# Patient Record
Sex: Male | Born: 1945 | Race: White | Hispanic: No | Marital: Single | Smoking: Current every day smoker
Health system: Southern US, Community
[De-identification: ages and names within clinical notes are randomized; demographics above are authoritative.]

## PROBLEM LIST (undated history)

## (undated) DIAGNOSIS — C61 Malignant neoplasm of prostate: Secondary | ICD-10-CM

## (undated) DIAGNOSIS — E119 Type 2 diabetes mellitus without complications: Secondary | ICD-10-CM

## (undated) DIAGNOSIS — IMO0001 Reserved for inherently not codable concepts without codable children: Secondary | ICD-10-CM

## (undated) DIAGNOSIS — R05 Cough: Secondary | ICD-10-CM

## (undated) DIAGNOSIS — R519 Headache, unspecified: Secondary | ICD-10-CM

## (undated) DIAGNOSIS — G4730 Sleep apnea, unspecified: Secondary | ICD-10-CM

## (undated) DIAGNOSIS — I2510 Atherosclerotic heart disease of native coronary artery without angina pectoris: Secondary | ICD-10-CM

## (undated) DIAGNOSIS — R972 Elevated prostate specific antigen [PSA]: Secondary | ICD-10-CM

## (undated) DIAGNOSIS — R609 Edema, unspecified: Secondary | ICD-10-CM

## (undated) DIAGNOSIS — R51 Headache: Secondary | ICD-10-CM

## (undated) DIAGNOSIS — I10 Essential (primary) hypertension: Secondary | ICD-10-CM

## (undated) DIAGNOSIS — D649 Anemia, unspecified: Secondary | ICD-10-CM

## (undated) DIAGNOSIS — J449 Chronic obstructive pulmonary disease, unspecified: Secondary | ICD-10-CM

## (undated) DIAGNOSIS — R0601 Orthopnea: Secondary | ICD-10-CM

## (undated) DIAGNOSIS — R059 Cough, unspecified: Secondary | ICD-10-CM

## (undated) DIAGNOSIS — M1990 Unspecified osteoarthritis, unspecified site: Secondary | ICD-10-CM

## (undated) DIAGNOSIS — R0902 Hypoxemia: Secondary | ICD-10-CM

## (undated) HISTORY — PX: KNEE ARTHROSCOPY: SHX127

## (undated) HISTORY — DX: Malignant neoplasm of prostate: C61

## (undated) HISTORY — DX: Anemia, unspecified: D64.9

## (undated) HISTORY — PX: COLONOSCOPY: SHX174

## (undated) HISTORY — PX: EYE SURGERY: SHX253

---

## 2003-07-15 ENCOUNTER — Other Ambulatory Visit: Payer: Self-pay

## 2004-09-06 ENCOUNTER — Ambulatory Visit: Payer: Self-pay | Admitting: Family Medicine

## 2005-06-11 ENCOUNTER — Emergency Department: Payer: Self-pay | Admitting: Emergency Medicine

## 2005-06-11 ENCOUNTER — Other Ambulatory Visit: Payer: Self-pay

## 2005-06-11 ENCOUNTER — Observation Stay: Payer: Self-pay | Admitting: General Surgery

## 2007-08-15 ENCOUNTER — Ambulatory Visit: Payer: Self-pay | Admitting: Internal Medicine

## 2008-08-16 ENCOUNTER — Inpatient Hospital Stay: Payer: Self-pay | Admitting: Internal Medicine

## 2008-12-02 DIAGNOSIS — J449 Chronic obstructive pulmonary disease, unspecified: Secondary | ICD-10-CM | POA: Insufficient documentation

## 2009-08-02 ENCOUNTER — Emergency Department: Payer: Self-pay | Admitting: Emergency Medicine

## 2009-08-03 ENCOUNTER — Emergency Department: Payer: Self-pay | Admitting: Emergency Medicine

## 2010-01-03 ENCOUNTER — Emergency Department: Payer: Self-pay | Admitting: Emergency Medicine

## 2010-06-28 DIAGNOSIS — M5430 Sciatica, unspecified side: Secondary | ICD-10-CM | POA: Insufficient documentation

## 2010-10-29 ENCOUNTER — Ambulatory Visit: Payer: Self-pay | Admitting: Family Medicine

## 2010-11-16 ENCOUNTER — Ambulatory Visit: Payer: Self-pay | Admitting: Family Medicine

## 2011-02-01 ENCOUNTER — Ambulatory Visit: Payer: Self-pay | Admitting: Family Medicine

## 2011-04-14 DIAGNOSIS — E119 Type 2 diabetes mellitus without complications: Secondary | ICD-10-CM | POA: Insufficient documentation

## 2011-07-21 DIAGNOSIS — R7402 Elevation of levels of lactic acid dehydrogenase (LDH): Secondary | ICD-10-CM | POA: Insufficient documentation

## 2012-02-13 ENCOUNTER — Ambulatory Visit: Payer: Self-pay | Admitting: Vascular Surgery

## 2012-02-13 LAB — BUN: BUN: 20 mg/dL — ABNORMAL HIGH (ref 7–18)

## 2012-02-13 LAB — CREATININE, SERUM
Creatinine: 1.53 mg/dL — ABNORMAL HIGH (ref 0.60–1.30)
EGFR (African American): 54 — ABNORMAL LOW

## 2012-06-12 HISTORY — PX: CARDIAC CATHETERIZATION: SHX172

## 2013-01-23 DIAGNOSIS — E785 Hyperlipidemia, unspecified: Secondary | ICD-10-CM | POA: Insufficient documentation

## 2013-01-23 DIAGNOSIS — I493 Ventricular premature depolarization: Secondary | ICD-10-CM | POA: Insufficient documentation

## 2013-01-23 DIAGNOSIS — I429 Cardiomyopathy, unspecified: Secondary | ICD-10-CM | POA: Insufficient documentation

## 2013-10-30 ENCOUNTER — Ambulatory Visit: Payer: Self-pay | Admitting: Ophthalmology

## 2013-10-30 DIAGNOSIS — I10 Essential (primary) hypertension: Secondary | ICD-10-CM

## 2013-10-30 LAB — POTASSIUM: Potassium: 4.4 mmol/L (ref 3.5–5.1)

## 2013-11-11 ENCOUNTER — Ambulatory Visit: Payer: Self-pay | Admitting: Ophthalmology

## 2013-11-28 ENCOUNTER — Inpatient Hospital Stay: Payer: Self-pay | Admitting: Internal Medicine

## 2013-11-28 LAB — BASIC METABOLIC PANEL
ANION GAP: 6 — AB (ref 7–16)
BUN: 16 mg/dL (ref 7–18)
CALCIUM: 8.6 mg/dL (ref 8.5–10.1)
CHLORIDE: 107 mmol/L (ref 98–107)
CREATININE: 1.34 mg/dL — AB (ref 0.60–1.30)
Co2: 27 mmol/L (ref 21–32)
EGFR (African American): >60
GFR CALC NON AF AMER: 54 — AB
GLUCOSE: 94 mg/dL (ref 65–99)
OSMOLALITY: 280 (ref 275–301)
POTASSIUM: 3.7 mmol/L (ref 3.5–5.1)
Sodium: 140 mmol/L (ref 136–145)

## 2014-08-13 DIAGNOSIS — M48062 Spinal stenosis, lumbar region with neurogenic claudication: Secondary | ICD-10-CM | POA: Insufficient documentation

## 2014-08-13 DIAGNOSIS — M5416 Radiculopathy, lumbar region: Secondary | ICD-10-CM | POA: Insufficient documentation

## 2014-09-29 NOTE — Op Note (Signed)
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   PROCEDURES PERFORMED:  1. Abdominal aortogram.  2. Left lower extremity distal runoff.   SURGEON: Hortencia Pilar, MD  SEDATION: Versed 4 mg IV, continuous ECG, pulse oximetry and cardiopulmonary monitoring was performed throughout the entire procedure by the interventional radiology nurse. Total sedation time was 45 minutes.   ACCESS: 5 French sheath, left common femoral artery.   CONTRAST USED: Isovue 95 mL.   FLUORO TIME: 1.4 minutes.   INDICATIONS: Michael Mathews is a 69 year old gentleman with increasing difficulty with ambulation. Physical exam as well as noninvasive studies suggested common iliac artery stenosis as well as distal disease. He is therefore undergoing angiography with the hope for intervention. The risks and benefits were reviewed, all questions answered, and the patient agrees to proceed.   DESCRIPTION OF PROCEDURE: The patient is taken to special procedures and placed in the supine position. After adequate sedation is achieved, ultrasound is placed in a sterile sleeve. Ultrasound is utilized secondary to lack of appropriate landmarks and to avoid vascular injury. Under direct ultrasound visualization, the common femoral artery is identified, it is pulsatile and echolucent indicating patency, and image is recorded for the permanent record. Under real-time visualization, micropuncture needle is inserted into the anterior wall, microwire followed by micro sheath, J-wire followed by 5 French sheath, and 5 French pigtail catheter. The pigtail catheter is positioned at the level of T12 and AP projection of the  aorta is obtained with a bolus injection of contrast. Pigtail catheter is then repositioned to above the bifurcation and bilateral oblique views are obtained. Magnified views of the left side are also obtained with the pigtail catheter slightly repositioned into the left lower extremity. Hand injection through the sheath is then used to perform distal runoff. Images from just below the trifurcation down are inadequate. This is secondary to multilevel stenoses within the arterial system. After review of the images, the patient had not been consented for left lower extremity interventions and therefore no further treatments at this time, oblique view of the groin is reviewed and a 5 French Mynx device deployed with excellent result. There were no immediate complications.   INTERPRETATION: The abdominal aorta is opacified with bolus injection of contrast. There are several infrarenal areas of plaque formation with mild to moderate narrowing, however, they do not achieve hemodynamic significance. The aortic bifurcation itself is widely patent and both left and right common and external iliac arteries are widely patent. In multiple views, also in magnified images, I do not identify the area which was noted on the ultrasound.   The left common femoral is widely patent and profunda femoris is patent, although it has diffuse disease. SFA demonstrates diffuse disease throughout its course with a subtotal occlusion at Hunter's canal. The mid popliteal demonstrates a short segment string sign and there also appears to be a string sign associated with the tibioperoneal trunk. Below this level, given the multilevel disease, images are inadequate.   SUMMARY: Multilevel disease within the left lower extremity beginning at [facility] canal and extending into the tibial vessels, as described above.  ____________________________ Katha Cabal, MD ggs:slb D: 02/13/2012 16:40:07 ET T: 02/13/2012 17:06:38  ET JOB#: [id number]  cc: Katha Cabal, MD, <Dictator> Iona Beard  Joaquim Lai, MD Katha Cabal MD ELECTRONICALLY SIGNED 02/16/2012 16:24

## 2014-10-03 NOTE — Consult Note (Signed)
Ace Inhibitors: Other   Impression 1 HTN 2. tobacco dependence 3. COPD not compliant with O2 at home 4 PAD POD #0 Abdominal Aortogram w/Run-Off; Right Lower Extremity; PTA right SFA and POP; PTA right AT; suction thrombectomy right distal popliteal Thrombus formation in distal popliteal 5. HLD 6. morbid obesity 7. DM   Plan 1. restart home meds try to wean off nifedepine gtt 2. cont SSI/ADA diet glipizide 3. nicotine patch counselled 3 minutes not going to quit 4. obesity weight loss as tolerated  thank you will follow  [phone]   Electronic Signatures: Bettey Costa (MD)  (Signed [phone] 18:31)  Authored: Allergies, Impression/Plan   Last Updated: 19-Jun-15 18:31 by Bettey Costa (MD)

## 2014-10-03 NOTE — Op Note (Signed)
PATIENT NAME:  Michael Mathews, Michael Mathews MR#:  [id number] DATE OF BIRTH:  02/12/1946  DATE OF PROCEDURE:  11/28/2013  PREOPERATIVE DIAGNOSES:  1.  Atherosclerotic occlusive disease, bilateral lower extremities, with rest pain of the right lower extremity.  2.  Morbid obesity.  3.  Chronic obstructive pulmonary disease.  4.  Sleep apnea.   POSTOPERATIVE DIAGNOSES:  1.  Atherosclerotic occlusive disease, bilateral lower extremities, with rest pain of the right lower extremity.  2.  Morbid obesity.  3.  Chronic obstructive pulmonary disease.  4.  Sleep apnea.  PROCEDURES PERFORMED: 1.  Abdominal aortogram.  2.  Right lower extremity distal runoff, third order catheter placement.  3.  Percutaneous transluminal angioplasty to a maximum diameter of 5 mm, right popliteal and SFA.  4.  Percutaneous transluminal angioplasty of the right anterior tibial to 2 mm.   SURGEON:  Katha Cabal, M.D.   SEDATION:  Versed 5 mg plus fentanyl 200 mcg administered IV.  Continuous ECG, pulse oximetry and cardiopulmonary monitoring was performed throughout the entire procedure by the interventional radiology nurse.  Total sedation time was 2 hours, 20 minutes.   ACCESS:  A 6 French sheath, left superficial femoral artery.   FLUOROSCOPY TIME:  31.7 minutes.   CONTRAST USED:  Isovue 125 mL.   INDICATIONS:  Michael Mathews is a 69 year old gentleman with multiple medical problems who presented with increasing pain in his right lower extremity and inability to walk.  Noninvasive studies as well as physical examination demonstrated profound atherosclerotic occlusive disease.  His ABI on the right measures 0.26.  Intervention was discussed.  Risks and benefits were reviewed.  The patient has elected to proceed.   DESCRIPTION OF PROCEDURE:  The patient is taken to special procedures and placed in the supine position.  After adequate sedation is achieved, both groins are prepped and draped in a sterile fashion.  Ultrasound is  placed in a sterile sleeve and the common femoral artery is identified at least at that point the femoral bifurcation was noted.  This appears on the final films prior to closure to have been a large branch emanating from the superficial femoral, but access is then obtained under direct visualization.  Artery is echolucent and pulsatile indicating patency.   Microwire followed by micro sheath, J-wire followed by a 5 French sheath and 5 French pigtail catheter and pigtail catheter is positioned at T12.  AP projection of the aorta is obtained.  Pigtail catheter is repositioned to above the bifurcation and LAO projection of the aorta is obtained.  Rim catheter and Glidewire crossed and the RAO projection of the groin is obtained.  The catheter is then negotiated into the SFA and distal runoff is obtained.  5000 units of heparin is given.  Stiff angled Glidewire is reintroduced and a 6 [place] Ansell sheath is advanced up and over the bifurcation.  Distal runoff is then completed demonstrating diffuse multilevel disease with several occluded segments due to eccentric calcific plaque.  Distally in the terminus of the popliteal at the level of the takeoff of the anterior tibial there is a focal occlusion.  There is occlusion of the anterior tibial, peroneal and posterior tibial throughout the proximal two-thirds.  Distally, the distal one-third of the posterior tibial reconstitutes, peroneal is poorly visualized.  Anterior tibial is nonvisualized throughout its course.   Using a combination of the crosser atherectomy catheter, several occlusions within the SFA are negotiated.  Catheter and glide wires are then negotiated and ultimately the lesion  in the distal popliteal is crossed into the anterior tibial, peroneal which is the dominant runoff to the foot could not be engaged.  In an attempt to get improved tibial flow, a 2 x 4 balloon is inflated in the anterior tibial at its origin.  Following this inflation, there  is now thrombus noted and a Fetch catheter is used to aspirate some thrombus.  The patient is started on Aggrastat and an additional 2000 units of heparin is given.  Then beginning in the mid popliteal a 4 x 100 Lutonix balloons are used to angioplasty the popliteal and SFA focal lesion at Hunter's canal which remained greater than 50% stenotic is angioplastied a second time with a 5 x 2 balloon.  Follow-up imaging demonstrates there remains diffuse disease throughout the entire common femoral as well as the SFA, but the SFA is now patent and there is in-line flow down to the popliteal.  Tibial vessel anatomy remains relatively unchanged.  The sheath is then pulled back into the left side.  An 11 cm 6 French sheath is exchanged.  Angiography and an oblique projection is performed and subsequently a Mynx device is deployed without difficulty.  Complication during the procedure is thrombus within the popliteal and therefore the patient is on Aggrastat.   INTERPRETATION:  The abdominal aorta as well as bilateral iliac arteries, internal, external and common are all diffusely diseased, but there are no hemodynamically significant stenoses.  There is diffuse disease throughout both common femorals of borderline hemodynamic significance.  The profunda is patent.  SFA demonstrates diffuse disease throughout its entire course with multiple areas of occlusion and/or greater than 80% stenosis.  This is true of the popliteal and the proximal tibials are all occluded.  Distally there is a posterior tibial, which fills the lateral plantar.  Peroneal appears to be patent in its proximal two-thirds, but is quite small at the level of the ankle.  Anterior tibial is nonvisualized.  Following angioplasty of the SFA and popliteal there is significant improvement now with in-line good flow.  Following angioplasty of the anterior tibial there is little improvement with evidence of a small amount of thrombus noted and therefore the  patient will be placed on Aggrastat.    ____________________________ Katha Cabal, MD ggs:ea D: 11/28/2013 18:10:50 ET T: 11/28/2013 23:43:08 ET JOB#: [id number]  cc: Katha Cabal, MD, <Dictator> Katha Cabal, MD Katha Cabal MD ELECTRONICALLY SIGNED 12/16/2013 15:19

## 2014-10-03 NOTE — Op Note (Signed)
PATIENT NAME:  Michael, Mathews MR#:  [id number] DATE OF BIRTH:  [birth year]  DATE OF PROCEDURE:  11/11/2000  PREOPERATIVE DIAGNOSIS: Visually significant cataract of the right eye.   POSTOPERATIVE DIAGNOSIS: Visually significant cataract of the right eye.   OPERATIVE PROCEDURE: Cataract extraction by phacoemulsification with implant of intraocular lens to right eye.   SURGEON: Birder Robson, MD.   ANESTHESIA:  1.  Managed anesthesia care.  2.  Topical tetracaine drops followed by 2% Xylocaine jelly applied in the preoperative holding area.   COMPLICATIONS: None.   TECHNIQUE:  Stop and chop.  DESCRIPTION OF PROCEDURE: The patient was examined and consented in the preoperative holding area where the aforementioned topical anesthesia was applied to the right eye and then brought back to the Operating Room where the right eye was prepped and draped in the usual sterile ophthalmic fashion and a lid speculum was placed. A paracentesis was created with the side port blade and the anterior chamber was filled with viscoelastic. A near clear corneal incision was performed with the steel keratome. A continuous curvilinear capsulorrhexis was performed with a cystotome followed by the capsulorrhexis forceps. Hydrodissection and hydrodelineation were carried out with BSS on a blunt cannula. The lens was removed in a stop and chop technique and the remaining cortical material was removed with the irrigation-aspiration handpiece. The capsular bag was inflated with viscoelastic and the Tecnis ZCB00 20.0-diopter lens, serial number [id number] was placed in the capsular bag without complication. The remaining viscoelastic was removed from the eye with the irrigation-aspiration handpiece. The wounds were hydrated. The anterior chamber was flushed with Miostat and the eye was inflated to physiologic pressure. 0.1 mL of cefuroxime concentration 10 mg/mL was placed in the anterior chamber. The wounds were found to be  water tight. The eye was dressed with Vigamox. The patient was given protective glasses to wear throughout the day and a shield with which to sleep tonight. The patient was also given drops with which to begin a drop regimen today and will follow-up with me in one day.      ____________________________ [name]. [name], MD wlp:dmm D: 11/11/2013 21:24:15 ET T: 11/11/2013 21:35:39 ET JOB#: [id number]  cc: [name] [name], MD, <Dictator> [name] [name] MD ELECTRONICALLY SIGNED 11/12/2013 13:44

## 2014-10-03 NOTE — Consult Note (Signed)
PATIENT NAME:  Michael Mathews, Michael Mathews MR#:  [id number] DATE OF BIRTH:  [birth year]  DATE OF CONSULTATION:  11/28/2013  REFERRING PHYSICIAN:  Katha Cabal, MD CONSULTING PHYSICIAN:  Sital P. Benjie Karvonen, MD PRIMARY CARE PHYSICIAN:  Ngwe A. Clide Deutscher, MD  REASON FOR CONSULTATION: Medical management.   IMPRESSION: 1.  Accelerated hypertension with systolic blood pressures in the 180s to 200s during procedure.  2.  Tobacco dependence.  3.  Chronic obstructive pulmonary disease, not compliant with oxygen.  4.  Peripheral arterial disease, postop day #2 with abdominal aortogram with runoff; right lower extremity percutaneous transluminal angioplasty right superficial femoral artery and popliteal; percutaneous transluminal angioplasty right anterior tibial; suction thrombectomy right distal popliteal with thrombus formation in the distal popliteal.  5.  Hyperlipidemia. 6.  Morbid obesity.  7.  Diabetes.   PLAN:  1.  Restart all home medications to try to wean off the nifedipine drip.  2.  Continue sliding scale insulin, ADA diet, glipizide.  3.  Nicotine patch. The patient was counseled for 3 minutes regarding stopping smoking. He does want to quit.  4.  Obesity. Weight loss as tolerated.  5.  Routine labs for the a.m.   HISTORY OF PRESENT ILLNESS: This is a 69 year old male with history of hyperlipidemia, hypertension, COPD (is supposed to wear oxygen but wears it p.r.n.), peripheral arterial disease, who had a routine procedure today for peripheral arterial disease with rest pain as mentioned above. Hospitalist was consulted for medical management. He is currently on a Nipride drip as well as a tirofiban drip.   REVIEW OF SYSTEMS:    CONSTITUTIONAL: No fevers, fatigue, weakness.  EYES: No blurred or double vision or glaucoma.  EARS, NOSE, THROAT: No hearing loss. Positive snoring. No epistaxis. Dentures.  RESPIRATORY: No cough, wheezing, hemoptysis. Positive history of COPD.  CARDIOVASCULAR: No chest  pain, orthopnea, edema, arrhythmia, dyspnea on exertion, palpitations. GASTROINTESTINAL: No nausea, vomiting, diarrhea, abdominal pain.  GENITOURINARY: No dysuria or hematuria.  ENDOCRINE: No polyuria or polydipsia.  HEMATOLOGIC AND LYMPHATIC: No bleeding or swollen glands.  SKIN: No rash or lesions.   MUSCULOSKELETAL: Positive limited activity due to PAD and obesity.  NEUROLOGIC: No history of CVA or TIAs.  PSYCHIATRIC: No history of anxiety or depression.   PAST MEDICAL HISTORY: 1.  Obesity.  2.  Lymphedema.  3.  Hyperlipidemia.  4.  Diabetes.  5.  COPD. 6.  Gout.  7.  History of SBO.  8.  Hypertension.  9.  PAD.   PAST SURGICAL HISTORY: Left knee surgery.   ALLERGIES: ACE INHIBITORS.   SOCIAL HISTORY: The patient smokes 1 pack a day. No alcohol or IV drug use.   MEDICATIONS: 1.  Cetirizine 10 mg daily.  2.  Atorvastatin 20 mg daily.  3.  Clonidine 0.3 b.i.d.  4.  Glipizide 10 mg daily.  5.  Gabapentin 300 mg b.i.d.  6.  Hydralazine 100 mg b.i.d.  7.  Losartan 100 mg daily.  8.  HCTZ 25 mg daily.  9.  Metoprolol 50 mg b.i.d.   FAMILY HISTORY: Positive for hypertension, CAD, CVA.   PHYSICAL EXAMINATION: VITAL SIGNS: Temperature 97.6, pulse 79, respirations 15, blood pressure 156/76, 96% on 2 liters.  GENERAL: The patient is not in acute distress, appears his stated age.  HEENT: Head is atraumatic. Pupils are round and reactive. Sclerae anicteric. Mucous membranes are moist. Oropharynx is clear.  NECK: Short. Hard to appreciate any enlarged thyroid or JVD.  CARDIOVASCULAR: Regular rate and rhythm. No murmur, gallops  or rubs. PMI is hard to palpate due to body habitus.  LUNGS: Clear to auscultation anteriorly without any crackles, rales, rhonchi or wheezing. Normal chest expansion.  ABDOMEN: Obese. Bowel sounds are positive. Nontender, nondistended. Hard to appreciate organomegaly due to body habitus.  EXTREMITIES: He has minimal edema bilaterally.  NEUROLOGIC: Cranial  nerves II through XII are intact. No focal deficits. Pulses were palpable bilaterally.  LABORATORY DATA: Sodium 140, potassium 3.7, chloride 107, bicarbonate 27, BUN 16, creatinine 1.34; glucose is 94. Calcium 8.1  Thank you for allowing me to participate in the care of this patient. Will continue to follow.   TIME SPENT ON THIS CONSULT: 50 minutes.  ____________________________ Donell Beers. Benjie Karvonen, MD spm:jcm D: 11/28/2013 18:34:38 ET T: 11/28/2013 20:49:30 ET JOB#: [id number]  cc: Sital P. Benjie Karvonen, MD, <Dictator> SITAL P MODY MD ELECTRONICALLY SIGNED 11/29/2013 23:30

## 2015-04-16 DIAGNOSIS — I70219 Atherosclerosis of native arteries of extremities with intermittent claudication, unspecified extremity: Secondary | ICD-10-CM | POA: Insufficient documentation

## 2015-09-01 DIAGNOSIS — E782 Mixed hyperlipidemia: Secondary | ICD-10-CM | POA: Insufficient documentation

## 2015-09-01 DIAGNOSIS — G4733 Obstructive sleep apnea (adult) (pediatric): Secondary | ICD-10-CM | POA: Insufficient documentation

## 2015-12-07 ENCOUNTER — Ambulatory Visit
Admission: RE | Admit: 2015-12-07 | Discharge: 2015-12-07 | Disposition: A | Discharge status: Home / Self Care | Payer: Medicare Other | Source: Ambulatory Visit | Attending: Family Medicine | Admitting: Family Medicine

## 2015-12-07 ENCOUNTER — Other Ambulatory Visit: Payer: Self-pay | Admitting: Family Medicine

## 2015-12-07 DIAGNOSIS — M79671 Pain in right foot: Secondary | ICD-10-CM

## 2015-12-07 DIAGNOSIS — M85871 Other specified disorders of bone density and structure, right ankle and foot: Secondary | ICD-10-CM | POA: Insufficient documentation

## 2016-02-29 ENCOUNTER — Encounter: Payer: Self-pay | Admitting: *Deleted

## 2016-03-03 ENCOUNTER — Observation Stay
Admission: EM | Admit: 2016-03-03 | Discharge: 2016-03-05 | Disposition: A | Discharge status: Home / Self Care | Payer: Medicare Other | Attending: Internal Medicine | Admitting: Internal Medicine

## 2016-03-03 ENCOUNTER — Encounter: Payer: Self-pay | Admitting: Emergency Medicine

## 2016-03-03 ENCOUNTER — Emergency Department: Payer: Medicare Other

## 2016-03-03 ENCOUNTER — Other Ambulatory Visit: Payer: Self-pay

## 2016-03-03 DIAGNOSIS — Z7984 Long term (current) use of oral hypoglycemic drugs: Secondary | ICD-10-CM | POA: Diagnosis not present

## 2016-03-03 DIAGNOSIS — R42 Dizziness and giddiness: Principal | ICD-10-CM

## 2016-03-03 DIAGNOSIS — N401 Enlarged prostate with lower urinary tract symptoms: Secondary | ICD-10-CM | POA: Insufficient documentation

## 2016-03-03 DIAGNOSIS — M1990 Unspecified osteoarthritis, unspecified site: Secondary | ICD-10-CM | POA: Insufficient documentation

## 2016-03-03 DIAGNOSIS — F17200 Nicotine dependence, unspecified, uncomplicated: Secondary | ICD-10-CM | POA: Insufficient documentation

## 2016-03-03 DIAGNOSIS — K5730 Diverticulosis of large intestine without perforation or abscess without bleeding: Secondary | ICD-10-CM | POA: Diagnosis not present

## 2016-03-03 DIAGNOSIS — J8410 Pulmonary fibrosis, unspecified: Secondary | ICD-10-CM | POA: Diagnosis not present

## 2016-03-03 DIAGNOSIS — I639 Cerebral infarction, unspecified: Secondary | ICD-10-CM

## 2016-03-03 DIAGNOSIS — I10 Essential (primary) hypertension: Secondary | ICD-10-CM | POA: Diagnosis not present

## 2016-03-03 DIAGNOSIS — I723 Aneurysm of iliac artery: Secondary | ICD-10-CM | POA: Diagnosis not present

## 2016-03-03 DIAGNOSIS — M549 Dorsalgia, unspecified: Secondary | ICD-10-CM

## 2016-03-03 DIAGNOSIS — M4806 Spinal stenosis, lumbar region: Secondary | ICD-10-CM | POA: Insufficient documentation

## 2016-03-03 DIAGNOSIS — E782 Mixed hyperlipidemia: Secondary | ICD-10-CM | POA: Insufficient documentation

## 2016-03-03 DIAGNOSIS — R55 Syncope and collapse: Secondary | ICD-10-CM

## 2016-03-03 DIAGNOSIS — N189 Chronic kidney disease, unspecified: Secondary | ICD-10-CM

## 2016-03-03 DIAGNOSIS — I700 Atherosclerosis of aorta: Secondary | ICD-10-CM | POA: Insufficient documentation

## 2016-03-03 DIAGNOSIS — I708 Atherosclerosis of other arteries: Secondary | ICD-10-CM | POA: Insufficient documentation

## 2016-03-03 DIAGNOSIS — G9389 Other specified disorders of brain: Secondary | ICD-10-CM | POA: Insufficient documentation

## 2016-03-03 DIAGNOSIS — R109 Unspecified abdominal pain: Secondary | ICD-10-CM

## 2016-03-03 DIAGNOSIS — I2510 Atherosclerotic heart disease of native coronary artery without angina pectoris: Secondary | ICD-10-CM | POA: Insufficient documentation

## 2016-03-03 DIAGNOSIS — N179 Acute kidney failure, unspecified: Secondary | ICD-10-CM | POA: Insufficient documentation

## 2016-03-03 DIAGNOSIS — M5126 Other intervertebral disc displacement, lumbar region: Secondary | ICD-10-CM | POA: Insufficient documentation

## 2016-03-03 DIAGNOSIS — J449 Chronic obstructive pulmonary disease, unspecified: Secondary | ICD-10-CM | POA: Diagnosis not present

## 2016-03-03 DIAGNOSIS — E11649 Type 2 diabetes mellitus with hypoglycemia without coma: Secondary | ICD-10-CM | POA: Diagnosis not present

## 2016-03-03 DIAGNOSIS — G4733 Obstructive sleep apnea (adult) (pediatric): Secondary | ICD-10-CM | POA: Insufficient documentation

## 2016-03-03 DIAGNOSIS — Z6834 Body mass index (BMI) 34.0-34.9, adult: Secondary | ICD-10-CM | POA: Insufficient documentation

## 2016-03-03 DIAGNOSIS — M5136 Other intervertebral disc degeneration, lumbar region: Secondary | ICD-10-CM | POA: Insufficient documentation

## 2016-03-03 DIAGNOSIS — R338 Other retention of urine: Secondary | ICD-10-CM | POA: Diagnosis not present

## 2016-03-03 DIAGNOSIS — R1030 Lower abdominal pain, unspecified: Secondary | ICD-10-CM | POA: Diagnosis not present

## 2016-03-03 DIAGNOSIS — K4020 Bilateral inguinal hernia, without obstruction or gangrene, not specified as recurrent: Secondary | ICD-10-CM | POA: Insufficient documentation

## 2016-03-03 DIAGNOSIS — Z823 Family history of stroke: Secondary | ICD-10-CM | POA: Insufficient documentation

## 2016-03-03 DIAGNOSIS — Z8249 Family history of ischemic heart disease and other diseases of the circulatory system: Secondary | ICD-10-CM | POA: Insufficient documentation

## 2016-03-03 DIAGNOSIS — E1151 Type 2 diabetes mellitus with diabetic peripheral angiopathy without gangrene: Secondary | ICD-10-CM | POA: Diagnosis not present

## 2016-03-03 DIAGNOSIS — I6523 Occlusion and stenosis of bilateral carotid arteries: Secondary | ICD-10-CM | POA: Diagnosis not present

## 2016-03-03 DIAGNOSIS — Z7982 Long term (current) use of aspirin: Secondary | ICD-10-CM | POA: Insufficient documentation

## 2016-03-03 DIAGNOSIS — Z66 Do not resuscitate: Secondary | ICD-10-CM | POA: Insufficient documentation

## 2016-03-03 LAB — COMPREHENSIVE METABOLIC PANEL
ALT: 10 U/L — ABNORMAL LOW (ref 17–63)
ANION GAP: 7 (ref 5–15)
AST: 15 U/L (ref 15–41)
Albumin: 3.4 g/dL — ABNORMAL LOW (ref 3.5–5.0)
Alkaline Phosphatase: 241 U/L — ABNORMAL HIGH (ref 38–126)
BUN: 28 mg/dL — ABNORMAL HIGH (ref 6–20)
CHLORIDE: 105 mmol/L (ref 101–111)
CO2: 25 mmol/L (ref 22–32)
CREATININE: 1.62 mg/dL — AB (ref 0.61–1.24)
Calcium: 8.4 mg/dL — ABNORMAL LOW (ref 8.9–10.3)
GFR, EST AFRICAN AMERICAN: 48 mL/min — AB (ref >60)
GFR, EST NON AFRICAN AMERICAN: 41 mL/min — AB (ref >60)
Glucose, Bld: 120 mg/dL — ABNORMAL HIGH (ref 65–99)
POTASSIUM: 3.5 mmol/L (ref 3.5–5.1)
SODIUM: 137 mmol/L (ref 135–145)
Total Bilirubin: 0.7 mg/dL (ref 0.3–1.2)
Total Protein: 7 g/dL (ref 6.5–8.1)

## 2016-03-03 LAB — URINALYSIS COMPLETE WITH MICROSCOPIC (ARMC ONLY)
Bilirubin Urine: NEGATIVE
Glucose, UA: NEGATIVE mg/dL
HGB URINE DIPSTICK: NEGATIVE
Ketones, ur: NEGATIVE mg/dL
LEUKOCYTES UA: NEGATIVE
Nitrite: NEGATIVE
PROTEIN: NEGATIVE mg/dL
SPECIFIC GRAVITY, URINE: 1.01 (ref 1.005–1.030)
pH: 7 (ref 5.0–8.0)

## 2016-03-03 LAB — GLUCOSE, CAPILLARY
GLUCOSE-CAPILLARY: 67 mg/dL (ref 65–99)
GLUCOSE-CAPILLARY: 57 mg/dL — AB (ref 65–99)
GLUCOSE-CAPILLARY: 101 mg/dL — AB (ref 65–99)
Glucose-Capillary: 59 mg/dL — ABNORMAL LOW (ref 65–99)
Glucose-Capillary: 89 mg/dL (ref 65–99)

## 2016-03-03 LAB — TROPONIN I
Troponin I: <0.03 ng/mL (ref <0.03)
Troponin I: <0.03 ng/mL (ref <0.03)

## 2016-03-03 LAB — LACTIC ACID, PLASMA: LACTIC ACID, VENOUS: 1.5 mmol/L (ref 0.5–1.9)

## 2016-03-03 LAB — CBC
HEMATOCRIT: 29.3 % — AB (ref 40.0–52.0)
Hemoglobin: 9.8 g/dL — ABNORMAL LOW (ref 13.0–18.0)
MCH: 30.9 pg (ref 26.0–34.0)
MCHC: 33.3 g/dL (ref 32.0–36.0)
MCV: 92.8 fL (ref 80.0–100.0)
PLATELETS: 185 10*3/uL (ref 150–440)
RBC: 3.15 MIL/uL — AB (ref 4.40–5.90)
RDW: 15.8 % — AB (ref 11.5–14.5)
WBC: 6.4 10*3/uL (ref 3.8–10.6)

## 2016-03-03 LAB — TYPE AND SCREEN
ABO/RH(D): A POS
ANTIBODY SCREEN: NEGATIVE

## 2016-03-03 LAB — TSH: TSH: 1.388 u[IU]/mL (ref 0.350–4.500)

## 2016-03-03 MED ORDER — MOMETASONE FURO-FORMOTEROL FUM 200-5 MCG/ACT IN AERO
2.0000 | INHALATION_SPRAY | Freq: Two times a day (BID) | RESPIRATORY_TRACT | Status: DC
  Administered 2016-03-03 – 2016-03-05 (×4): 2 via RESPIRATORY_TRACT
  Filled 2016-03-03: qty 8.8

## 2016-03-03 MED ORDER — ATORVASTATIN CALCIUM 10 MG PO TABS
10.0000 mg | ORAL_TABLET | Freq: Every day | ORAL | Status: DC
  Administered 2016-03-03 – 2016-03-05 (×3): 10 mg via ORAL
  Filled 2016-03-03 (×3): qty 1

## 2016-03-03 MED ORDER — INSULIN ASPART 100 UNIT/ML ~~LOC~~ SOLN: 0.0000 [IU] | Freq: Three times a day (TID) | SUBCUTANEOUS | Status: DC

## 2016-03-03 MED ORDER — ENOXAPARIN SODIUM 40 MG/0.4ML ~~LOC~~ SOLN
40.0000 mg | SUBCUTANEOUS | Status: DC
  Administered 2016-03-03 – 2016-03-04 (×2): 40 mg via SUBCUTANEOUS
  Filled 2016-03-03 (×2): qty 0.4

## 2016-03-03 MED ORDER — IOPAMIDOL (ISOVUE-300) INJECTION 61%
30.0000 mL | Freq: Once | INTRAVENOUS | Status: AC | PRN
  Administered 2016-03-03: 30 mL via ORAL

## 2016-03-03 MED ORDER — SENNOSIDES-DOCUSATE SODIUM 8.6-50 MG PO TABS: 1.0000 | ORAL_TABLET | Freq: Every evening | ORAL | Status: DC | PRN

## 2016-03-03 MED ORDER — ACETAMINOPHEN 650 MG RE SUPP: 650.0000 mg | Freq: Four times a day (QID) | RECTAL | Status: DC | PRN

## 2016-03-03 MED ORDER — IOPAMIDOL (ISOVUE-300) INJECTION 61%
75.0000 mL | Freq: Once | INTRAVENOUS | Status: AC | PRN
  Administered 2016-03-03: 75 mL via INTRAVENOUS

## 2016-03-03 MED ORDER — CLONIDINE HCL 0.1 MG PO TABS
0.3000 mg | ORAL_TABLET | Freq: Every day | ORAL | Status: DC
  Administered 2016-03-03 – 2016-03-05 (×3): 0.3 mg via ORAL
  Filled 2016-03-03 (×3): qty 3

## 2016-03-03 MED ORDER — DULOXETINE HCL 30 MG PO CPEP
30.0000 mg | ORAL_CAPSULE | Freq: Every day | ORAL | Status: DC
  Administered 2016-03-03 – 2016-03-05 (×3): 30 mg via ORAL
  Filled 2016-03-03 (×3): qty 1

## 2016-03-03 MED ORDER — LOSARTAN POTASSIUM 50 MG PO TABS
100.0000 mg | ORAL_TABLET | Freq: Every day | ORAL | Status: DC
  Administered 2016-03-03 – 2016-03-05 (×3): 100 mg via ORAL
  Filled 2016-03-03 (×3): qty 2

## 2016-03-03 MED ORDER — INSULIN ASPART 100 UNIT/ML ~~LOC~~ SOLN
0.0000 [IU] | Freq: Every day | SUBCUTANEOUS | Status: DC
Start: 2016-03-03 — End: 2016-03-05

## 2016-03-03 MED ORDER — GLIPIZIDE ER 10 MG PO TB24: 10.0000 mg | ORAL_TABLET | Freq: Two times a day (BID) | ORAL | Status: DC

## 2016-03-03 MED ORDER — POVIDONE-IODINE 5 % OP SOLN: 1.0000 "application " | Freq: Once | OPHTHALMIC | Status: DC

## 2016-03-03 MED ORDER — HYDRALAZINE HCL 50 MG PO TABS
100.0000 mg | ORAL_TABLET | Freq: Three times a day (TID) | ORAL | Status: DC
  Administered 2016-03-03 – 2016-03-05 (×6): 100 mg via ORAL
  Filled 2016-03-03 (×6): qty 2

## 2016-03-03 MED ORDER — TETRACAINE HCL 0.5 % OP SOLN: 1.0000 [drp] | Freq: Once | OPHTHALMIC | Status: DC

## 2016-03-03 MED ORDER — ONDANSETRON HCL 4 MG PO TABS
4.0000 mg | ORAL_TABLET | Freq: Four times a day (QID) | ORAL | Status: DC | PRN
  Administered 2016-03-05: 4 mg via ORAL
  Filled 2016-03-03: qty 1

## 2016-03-03 MED ORDER — ACETAMINOPHEN 325 MG PO TABS
650.0000 mg | ORAL_TABLET | Freq: Four times a day (QID) | ORAL | Status: DC | PRN
  Administered 2016-03-05: 650 mg via ORAL
  Filled 2016-03-03: qty 2

## 2016-03-03 MED ORDER — ONDANSETRON HCL 4 MG/2ML IJ SOLN: 4.0000 mg | Freq: Four times a day (QID) | INTRAMUSCULAR | Status: DC | PRN

## 2016-03-03 MED ORDER — ASPIRIN EC 325 MG PO TBEC
325.0000 mg | DELAYED_RELEASE_TABLET | ORAL | Status: DC
  Administered 2016-03-04 – 2016-03-05 (×2): 325 mg via ORAL
  Filled 2016-03-03 (×2): qty 1

## 2016-03-03 MED ORDER — GLUCOSE 4 G PO CHEW
3.0000 | CHEWABLE_TABLET | Freq: Once | ORAL | Status: AC
  Administered 2016-03-03: 12 g via ORAL
  Filled 2016-03-03: qty 3

## 2016-03-03 MED ORDER — SODIUM CHLORIDE 0.9 % IV SOLN
INTRAVENOUS | Status: DC
Start: 2016-03-03 — End: 2016-03-05
  Administered 2016-03-03 – 2016-03-05 (×3): via INTRAVENOUS

## 2016-03-03 MED ORDER — INSULIN ASPART 100 UNIT/ML ~~LOC~~ SOLN: 0.0000 [IU] | Freq: Every day | SUBCUTANEOUS | Status: DC

## 2016-03-03 MED ORDER — ARMC OPHTHALMIC DILATING GEL: 1.0000 "application " | OPHTHALMIC | Status: DC | PRN

## 2016-03-03 MED ORDER — SODIUM CHLORIDE 0.9 % IV SOLN: INTRAVENOUS | Status: DC

## 2016-03-03 MED ORDER — SODIUM CHLORIDE 0.9 % IV BOLUS (SEPSIS)
1000.0000 mL | Freq: Once | INTRAVENOUS | Status: AC
  Administered 2016-03-03: 1000 mL via INTRAVENOUS

## 2016-03-03 MED ORDER — SODIUM CHLORIDE 0.9% FLUSH
3.0000 mL | Freq: Two times a day (BID) | INTRAVENOUS | Status: DC
  Administered 2016-03-03: 3 mL via INTRAVENOUS
  Administered 2016-03-04: 10 mL via INTRAVENOUS
  Administered 2016-03-05: 3 mL via INTRAVENOUS

## 2016-03-03 MED ORDER — ONDANSETRON HCL 4 MG/2ML IJ SOLN: 4.0000 mg | Freq: Once | INTRAMUSCULAR | Status: DC

## 2016-03-03 MED ORDER — MECLIZINE HCL 12.5 MG PO TABS
12.5000 mg | ORAL_TABLET | Freq: Two times a day (BID) | ORAL | Status: DC | PRN
  Administered 2016-03-05: 12.5 mg via ORAL
  Filled 2016-03-03: qty 1

## 2016-03-03 MED ORDER — MOXIFLOXACIN HCL 0.5 % OP SOLN: 1.0000 [drp] | OPHTHALMIC | Status: DC | PRN

## 2016-03-03 MED ORDER — TERAZOSIN HCL 5 MG PO CAPS
10.0000 mg | ORAL_CAPSULE | Freq: Every day | ORAL | Status: DC
  Administered 2016-03-03 – 2016-03-04 (×2): 10 mg via ORAL
  Filled 2016-03-03 (×2): qty 2

## 2016-03-03 NOTE — Progress Notes (Signed)
Inpatient Diabetes Program Recommendations  AACE/ADA: New Consensus Statement on Inpatient Glycemic Control (2015)  Target Ranges:  Prepandial:   less than 140 mg/dL      Peak postprandial:   less than 180 mg/dL (1-2 hours)      Critically ill patients:  140 - 180 mg/dL    Review of Glycemic ControlResults for Michael Mathews, Michael Mathews (MRN [id number]) as of 03/03/2016 14:35  Ref. Range 03/03/2016 10:32  Glucose Latest Ref Range: 65 - 99 mg/dL 120 (H)   Chart reviewed and referral received. Diabetes history: Diabetes Mellitus Outpatient Diabetes medications: Glucotrol XL 10 mg bid, Metformin 500 mg bid Current orders for Inpatient glycemic control:  Novolog sensitive tid with meals and HS  Inpatient Diabetes Program Recommendations:    Agree with current orders. Consider ordering A1C to determine pre-hospitalization glycemic control.  Thanks,  Adah Perl, RN, BC-ADM Inpatient Diabetes Coordinator Pager [phone] (8a-5p)

## 2016-03-03 NOTE — ED Notes (Signed)
Patient states that for the past few days he has been feeling dizzy and nauseated. Patient reports a sensation of the room spinning. Patient has a hx/o vertigo and states that this feels like his episodes of vertigo in the past.

## 2016-03-03 NOTE — Progress Notes (Signed)
Patient CBG 57. Refused food and drink. Given 3 glucose tablets. CBG above 101. Will continue to monitor.

## 2016-03-03 NOTE — Care Management Obs Status (Signed)
[place] NOTIFICATION   Patient Details  Name: CLESTER NOLE MRN: [id number] Date of Birth: 02-24-1946   Medicare Observation Status Notification Given:   Yes    Beau Fanny, RN 03/03/2016, 1:31 PM

## 2016-03-03 NOTE — H&P (Addendum)
[facility] NAME: Michael Mathews    MR#:  [id number]  DATE OF BIRTH:  1945-06-15  DATE OF ADMISSION:  03/03/2016  PRIMARY CARE PHYSICIAN: [name], MD   REQUESTING/REFERRING PHYSICIAN:  Dr Darl Householder  CHIEF COMPLAINT:   Dizziness and abdominal pain HISTORY OF PRESENT ILLNESS:  Michael Mathews  is a 70 y.o. male with a known history of Peripheral vascular disease, diabetes, OSA on CPAP and morbid obesity who presents above complaint. Patient reports over the past 3-5 days he has had dizziness and lightheadedness he also describes Crampy lower abdominal pain. He also states for the past week he has had dark stools not associated with his Pepto-Bismol. In the emergency room he was guaiac negative. Patient has had a history of vertigo. Patient denies neurological deficits including aphasia or weakness on one side. Patient denies loss of consciousness. Patient denies chest pain or shortness of breath. These episodes of dizziness and lightheadedness are only when he walks/exerts himself. His blood pressure was low in the emergency room with systolic blood pressure in the 80s and heart rates have been persistently in the 50s. Denies viral illness, fever or chills. Denies tinnitus or hearing loss.  PAST MEDICAL HISTORY:   Past Medical History:  Diagnosis Date  . Arthritis   . Asthma   . COPD (chronic obstructive pulmonary disease) ([place])   . Cough    chronic  . Diabetes mellitus without complication ([place])   . Edema   . Hypertension   . Orthopnea   . Oxygen deficit    o2 prn  . Shortness of breath dyspnea   . Sleep apnea    cpap    PAST SURGICAL HISTORY:   Past Surgical History:  Procedure Laterality Date  . CARDIAC CATHETERIZATION    . COLONOSCOPY    . EYE SURGERY    . KNEE ARTHROSCOPY      SOCIAL HISTORY:   Social History  Substance Use Topics  . Smoking status: Current Every Day Smoker  . Smokeless tobacco: No  . Alcohol use  No    FAMILY HISTORY:  CVA CAD  DRUG ALLERGIES:  No Known Allergies  REVIEW OF SYSTEMS:   Review of Systems  Constitutional: Negative.  Negative for chills, fever and malaise/fatigue.       Dizziness and lightheadedness  HENT: Negative.  Negative for ear discharge, ear pain, hearing loss, nosebleeds and sore throat.   Eyes: Negative.  Negative for blurred vision and pain.  Respiratory: Negative.  Negative for cough, hemoptysis, shortness of breath and wheezing.   Cardiovascular: Negative.  Negative for chest pain, palpitations and leg swelling.  Gastrointestinal: Positive for abdominal pain and melena. Negative for blood in stool, diarrhea, nausea and vomiting.  Genitourinary: Negative.  Negative for dysuria.  Musculoskeletal: Negative.  Negative for back pain.  Skin: Negative.   Neurological: Negative for dizziness, tremors, speech change, focal weakness, seizures and headaches.  Endo/Heme/Allergies: Negative.  Does not bruise/bleed easily.  Psychiatric/Behavioral: Negative.  Negative for depression, hallucinations and suicidal ideas.    MEDICATIONS AT HOME:   Prior to Admission medications   Medication Sig Start Date End Date Taking? Authorizing Provider  atorvastatin (LIPITOR) 10 MG tablet Take 10 mg by mouth daily.    Historical Provider, MD  cloNIDine (CATAPRES) 0.3 MG tablet Take 0.3 mg by mouth daily.    Historical Provider, MD  DULoxetine (CYMBALTA) 30 MG capsule Take 30 mg by mouth daily.  Historical Provider, MD  gabapentin (NEURONTIN) 300 MG capsule Take 300 mg by mouth.    Historical Provider, MD  glipiZIDE (GLUCOTROL XL) 10 MG 24 hr tablet Take 10 mg by mouth 2 (two) times daily.    Historical Provider, MD  hydrALAZINE (APRESOLINE) 100 MG tablet Take 100 mg by mouth 3 (three) times daily.    Historical Provider, MD  hydrochlorothiazide (HYDRODIURIL) 25 MG tablet Take 25 mg by mouth daily.    Historical Provider, MD  losartan (COZAAR) 100 MG tablet Take 100 mg by  mouth daily.    Historical Provider, MD  metFORMIN (GLUCOPHAGE) 500 MG tablet Take 500 mg by mouth 2 (two) times daily with a meal.    Historical Provider, MD  metoprolol succinate (TOPROL-XL) 50 MG 24 hr tablet Take 50 mg by mouth daily. Take with or immediately following a meal.    Historical Provider, MD  tamsulosin (FLOMAX) 0.4 MG CAPS capsule Take 0.6 mg by mouth daily.    Historical Provider, MD  terazosin (HYTRIN) 2 MG capsule Take 2 mg by mouth at bedtime.    Historical Provider, MD      VITAL SIGNS:  Blood pressure (!) 127/54, pulse (!) 53, temperature 97.6 F (36.4 C), temperature source Oral, resp. rate 16, height 5\' 10"  (1.778 m), weight 111.1 kg (245 lb), SpO2 98 %.  PHYSICAL EXAMINATION:   Physical Exam  Constitutional: He is oriented to person, place, and time and well-developed, well-nourished, and in no distress. No distress.  HENT:  Head: Normocephalic.  Short neck  Eyes: No scleral icterus.  Neck: Normal range of motion. Neck supple. No JVD present. No tracheal deviation present.  No carotid bruit  Cardiovascular: Normal rate, regular rhythm and normal heart sounds.  Exam reveals no gallop and no friction rub.   No murmur heard. Pulmonary/Chest: Effort normal and breath sounds normal. No respiratory distress. He has no wheezes. He has no rales. He exhibits no tenderness.  Abdominal: Soft. Bowel sounds are normal. He exhibits no distension and no mass. There is no tenderness. There is no rebound and no guarding.  Musculoskeletal: Normal range of motion. He exhibits no edema.  Neurological: He is alert and oriented to person, place, and time.  Skin: Skin is warm. No rash noted. No erythema.  Psychiatric: Affect and judgment normal.      LABORATORY PANEL:   CBC  Recent Labs Lab 03/03/16 1032  WBC 6.4  HGB 9.8*  HCT 29.3*  PLT 185    ------------------------------------------------------------------------------------------------------------------  Chemistries   Recent Labs Lab 03/03/16 1032  NA 137  K 3.5  CL 105  CO2 25  GLUCOSE 120*  BUN 28*  CREATININE 1.62*  CALCIUM 8.4*  AST 15  ALT 10*  ALKPHOS 241*  BILITOT 0.7   ------------------------------------------------------------------------------------------------------------------  Cardiac Enzymes  Recent Labs Lab 03/03/16 1032  TROPONINI <0.03   ------------------------------------------------------------------------------------------------------------------  RADIOLOGY:  Dg Chest 2 View  Result Date: 03/03/2016 CLINICAL DATA:  Shortness of breath with weakness, hypertension and abdominal pain. EXAM: CHEST  2 VIEW COMPARISON:  08/16/2008 FINDINGS: Old right seventh rib fracture. Age-indeterminate fracture involving the right ninth rib. Subtle densities at the right lung base could represent atelectasis. No significant airspace disease. Heart and mediastinum are within normal limits and stable. Trachea is midline. Again noted is a sclerotic lesion in the proximal left humerus that is suggestive for an enchondroma. No significant pleural effusions. IMPRESSION: No acute chest abnormality. Presumed old right rib fractures. Electronically Signed   By: Quita Skye  Anselm Pancoast M.D.   On: 03/03/2016 11:09   Ct Head Wo Contrast  Result Date: 03/03/2016 CLINICAL DATA:  Headache, dizziness and neck pain this week. EXAM: CT HEAD WITHOUT CONTRAST TECHNIQUE: Contiguous axial images were obtained from the base of the skull through the vertex without intravenous contrast. COMPARISON:  Head CT 08/16/2008 FINDINGS: Brain: Stable age related cerebral atrophy, ventriculomegaly and periventricular white matter disease. No extra-axial fluid collections are identified. No CT findings for acute hemispheric infarction or intracranial hemorrhage. No mass lesions. The brainstem and  cerebellum are normal. Vascular: Moderate vascular calcifications. No obvious aneurysm or worrisome hyperdense vessels. Skull: Fell skull fracture or bone lesion. Sinuses/Orbits: The paranasal sinuses and mastoid air cells are grossly clear. The globes are intact. Other: No scalp lesion or hematoma. IMPRESSION: Slightly progressive age related cerebral atrophy, ventriculomegaly and periventricular white matter disease. No acute intracranial findings or mass lesion. Electronically Signed   By: Marijo Sanes M.D.   On: 03/03/2016 12:58   Ct Abdomen Pelvis W Contrast  Result Date: 03/03/2016 CLINICAL DATA:  Weakness, hypotension and abdominal pain. EXAM: CT ABDOMEN AND PELVIS WITH CONTRAST TECHNIQUE: Multidetector CT imaging of the abdomen and pelvis was performed using the standard protocol following bolus administration of intravenous contrast. CONTRAST:  44mL ISOVUE-300 IOPAMIDOL (ISOVUE-300) INJECTION 61% COMPARISON:  06/11/2005 FINDINGS: Lower chest: Lung bases are clear except for a calcified granuloma posteriorly in the right lower lobe. No pleural or pericardial fluid. Hepatobiliary: Normal Pancreas: Normal Spleen: Normal Adrenals/Urinary Tract: Adrenal glands are normal. There is extensive renal arterial calcification. No evidence of mass, cyst or hydronephrosis. Some focal atrophy at the lower pole of left kidney. Stomach/Bowel: No intestinal abnormality seen. Vascular/Lymphatic: Advanced arterial atherosclerosis. No abdominal aneurysm. Because of the advanced atherosclerotic disease of the superior mesenteric artery, mesenteric ischemia syndrome could occur. Reproductive: Normal Other: No ascites or free air. Bilateral inguinal hernias containing fat. Musculoskeletal: Advanced chronic degenerative changes affecting the lumbar spine. IMPRESSION: No acute organ pathology. Advanced diffuse atherosclerosis including aortic atherosclerosis. Superior mesenteric artery atherosclerosis raising the question if  the patient could be experiencing mesenteric ischemia syndrome. No visible bowel pathology on this scan. Bilateral inguinal hernias containing fat. Advanced lower lumbar degenerative disease. Electronically Signed   By: Nelson Chimes M.D.   On: 03/03/2016 13:00    EKG:  Sinus bradycardia heart rate 53 no ST elevation or depression  IMPRESSION AND PLAN:   70 year old male with peripheral vascular disease and morbid obesity who presents with dizziness and lightheadedness as well as abdominal pain and dark color stools guaiac negative.,  1. Dizziness/lightheadedness without neurological deficits: This may be vertigo or related to bradycardia/arrhythmia or carotid artery disease. Admit patient to telemetry. Carotid ultrasound. Echocardiogram ordered. Monitor troponins Cardiology consultation Supportive care with meclizine when necessary PT evaluation Check TSH due to bradycardia and hold metoprolol due to pericardial 2. Abdominal pain with concern for possible mesenteric ischemia/dark-colored stools with guaiac positive: Order lactic acid. Vascular surgery consult.  Consider GI evaluation  Follow hemoglobin Patient may need CTA however due to renal function will not be ordered today    3. Diabetes: Continue sliding scale insulin and hold metformin for now. ADA diet Diabetes coordinator consult  4. Acute kidney injury: IV fluids and repeat BMP in a.m. Hold nephrotoxic agents.  5. Essential hypertension: Continue outpatient medications including  clonidine, hydralazine and losartan. Will hold HCTZ for now We'll also discontinue metoprolol for now due to bradycardia which may be causing dizziness and lightheadedness.   6. BPH:  Continue Hytrin 7. Hyperlipidemia: Continue Lipitor    8. Tobacco dependence: Patient is highly encouraged to stop smoking. Patient was counseled for 3 minutes. Patient does not want a nicotine patch. Patient needs a lot of encouragement.  9. OSA: CPAP  ordered  10. COPD: Patient is not in exacerbation Continue inhalers All the records are reviewed and case discussed with ED provider. Management plans discussed with the patient and he in agreement  CODE STATUS: DNR  TOTAL TIME TAKING CARE OF THIS PATIENT: 50 minutes.    Kaelan Emami M.D on 03/03/2016 at 1:27 PM  Between 7am to 6pm - Pager - [phone]  After 6pm go to [email or web address] - password [facility]  Office  [phone]  CC: Primary care physician; [name], MD

## 2016-03-03 NOTE — ED Provider Notes (Signed)
[facility] Provider Note   CSN: [id number] Arrival date & time: 03/03/16  [id number]     History   Chief Complaint Chief Complaint  Patient presents with  . Dizziness    HPI Michael Mathews is a 70 y.o. male hx of COPD, DM, HTN, here with Weakness, dizziness, vertigo, abdominal pain. Patient states that the last several days he has been feeling that the room was spinning. Has a history of vertigo and similar to his previous vertigo. Also has been feeling lightheaded and dizzy like he is on pass out. Moreover for the last week or so he's been having diffuse abdominal pain. States that he feels nauseated but is able to keep fluids down and has not been vomiting. He has been having dark stools but has been taking Pepto-Bismol. No history of GI bleed and is currently taking aspirin. Currently not on any blood thinners. He was noted to be hypotensive in triage.   The history is provided by the patient.    Past Medical History:  Diagnosis Date  . Arthritis   . Asthma   . COPD (chronic obstructive pulmonary disease) ([place])   . Cough    chronic  . Diabetes mellitus without complication ([place])   . Edema   . Hypertension   . Orthopnea   . Oxygen deficit    o2 prn  . Shortness of breath dyspnea   . Sleep apnea    cpap    There are no active problems to display for this patient.   Past Surgical History:  Procedure Laterality Date  . CARDIAC CATHETERIZATION    . COLONOSCOPY    . EYE SURGERY    . KNEE ARTHROSCOPY         Home Medications    Prior to Admission medications   Medication Sig Start Date End Date Taking? Authorizing Provider  atorvastatin (LIPITOR) 10 MG tablet Take 10 mg by mouth daily.    Historical Provider, MD  cloNIDine (CATAPRES) 0.3 MG tablet Take 0.3 mg by mouth daily.    Historical Provider, MD  DULoxetine (CYMBALTA) 30 MG capsule Take 30 mg by mouth daily.    Historical Provider, MD  gabapentin (NEURONTIN) 300 MG capsule Take 300 mg by mouth.     Historical Provider, MD  glipiZIDE (GLUCOTROL XL) 10 MG 24 hr tablet Take 10 mg by mouth 2 (two) times daily.    Historical Provider, MD  hydrALAZINE (APRESOLINE) 100 MG tablet Take 100 mg by mouth 3 (three) times daily.    Historical Provider, MD  hydrochlorothiazide (HYDRODIURIL) 25 MG tablet Take 25 mg by mouth daily.    Historical Provider, MD  losartan (COZAAR) 100 MG tablet Take 100 mg by mouth daily.    Historical Provider, MD  metFORMIN (GLUCOPHAGE) 500 MG tablet Take 500 mg by mouth 2 (two) times daily with a meal.    Historical Provider, MD  metoprolol succinate (TOPROL-XL) 50 MG 24 hr tablet Take 50 mg by mouth daily. Take with or immediately following a meal.    Historical Provider, MD  tamsulosin (FLOMAX) 0.4 MG CAPS capsule Take 0.6 mg by mouth daily.    Historical Provider, MD  terazosin (HYTRIN) 2 MG capsule Take 2 mg by mouth at bedtime.    Historical Provider, MD    Family History History reviewed. No pertinent family history.  Social History Social History  Substance Use Topics  . Smoking status: Current Every Day Smoker  . Smokeless tobacco: Not on file  . Alcohol  use No     Allergies   Review of patient's allergies indicates no known allergies.   Review of Systems Review of Systems  Gastrointestinal: Positive for abdominal pain.  Neurological: Positive for dizziness and weakness.  All other systems reviewed and are negative.    Physical Exam Updated Vital Signs BP (!) 82/41 Comment: took twice  Pulse (!) 58   Temp 97.6 F (36.4 C) (Oral)   Resp 18   Ht 5\' 10"  (1.778 m)   Wt 245 lb (111.1 kg)   SpO2 94%   BMI 35.15 kg/m   Physical Exam  Constitutional: He is oriented to person, place, and time.  Chronically ill appearing, overweight   HENT:  Head: Normocephalic.  MM slightly dry   Eyes: EOM are normal. Pupils are equal, round, and reactive to light.  No obvious nystagmus   Neck: Normal range of motion. Neck supple.  Cardiovascular:  Normal rate, regular rhythm and normal heart sounds.   Pulmonary/Chest: Effort normal and breath sounds normal. No respiratory distress. He has no wheezes. He has no rales.  Abdominal: Soft. Bowel sounds are normal.  Mild LLQ tenderness, no rebound. No obvious pulsatile mass   Genitourinary:  Genitourinary Comments: Brown stool, occ neg   Musculoskeletal: Normal range of motion.  Neurological: He is alert and oriented to person, place, and time.  Skin: Skin is warm.  Psychiatric: He has a normal mood and affect.  Nursing note and vitals reviewed.    ED Treatments / Results  Labs (all labs ordered are listed, but only abnormal results are displayed) Labs Reviewed  CBC - Abnormal; Notable for the following:       Result Value   RBC 3.15 (*)    Hemoglobin 9.8 (*)    HCT 29.3 (*)    RDW 15.8 (*)    All other components within normal limits  URINALYSIS COMPLETEWITH MICROSCOPIC ([facility] ONLY) - Abnormal; Notable for the following:    Color, Urine YELLOW (*)    APPearance CLEAR (*)    Bacteria, UA RARE (*)    Squamous Epithelial / LPF 0-5 (*)    All other components within normal limits  COMPREHENSIVE METABOLIC PANEL - Abnormal; Notable for the following:    Glucose, Bld 120 (*)    BUN 28 (*)    Creatinine, Ser 1.62 (*)    Calcium 8.4 (*)    Albumin 3.4 (*)    ALT 10 (*)    Alkaline Phosphatase 241 (*)    GFR calc non Af Amer 41 (*)    GFR calc Af Amer 48 (*)    All other components within normal limits  TROPONIN I  LACTIC ACID, PLASMA  LACTIC ACID, PLASMA  TROPONIN I  TROPONIN I  TROPONIN I  CBG MONITORING, ED  TYPE AND SCREEN    EKG  EKG Interpretation None      ED ECG REPORT I, Richardean Canalavid H Lei Dower, the attending physician, personally viewed and interpreted this ECG.   Date: 03/03/2016  EKG Time: 10:21 am  Rate: 53  Rhythm: normal EKG, normal sinus rhythm  Axis: normal  Intervals:none  ST&T Change: nonspecific    Radiology Dg Chest 2 View  Result Date:  03/03/2016 CLINICAL DATA:  Shortness of breath with weakness, hypertension and abdominal pain. EXAM: CHEST  2 VIEW COMPARISON:  08/16/2008 FINDINGS: Old right seventh rib fracture. Age-indeterminate fracture involving the right ninth rib. Subtle densities at the right lung base could represent atelectasis. No significant airspace disease.  Heart and mediastinum are within normal limits and stable. Trachea is midline. Again noted is a sclerotic lesion in the proximal left humerus that is suggestive for an enchondroma. No significant pleural effusions. IMPRESSION: No acute chest abnormality. Presumed old right rib fractures. Electronically Signed   By: Markus Daft M.D.   On: 03/03/2016 11:09   Ct Head Wo Contrast  Result Date: 03/03/2016 CLINICAL DATA:  Headache, dizziness and neck pain this week. EXAM: CT HEAD WITHOUT CONTRAST TECHNIQUE: Contiguous axial images were obtained from the base of the skull through the vertex without intravenous contrast. COMPARISON:  Head CT 08/16/2008 FINDINGS: Brain: Stable age related cerebral atrophy, ventriculomegaly and periventricular white matter disease. No extra-axial fluid collections are identified. No CT findings for acute hemispheric infarction or intracranial hemorrhage. No mass lesions. The brainstem and cerebellum are normal. Vascular: Moderate vascular calcifications. No obvious aneurysm or worrisome hyperdense vessels. Skull: Fell skull fracture or bone lesion. Sinuses/Orbits: The paranasal sinuses and mastoid air cells are grossly clear. The globes are intact. Other: No scalp lesion or hematoma. IMPRESSION: Slightly progressive age related cerebral atrophy, ventriculomegaly and periventricular white matter disease. No acute intracranial findings or mass lesion. Electronically Signed   By: Marijo Sanes M.D.   On: 03/03/2016 12:58   Ct Abdomen Pelvis W Contrast  Result Date: 03/03/2016 CLINICAL DATA:  Weakness, hypotension and abdominal pain. EXAM: CT ABDOMEN AND  PELVIS WITH CONTRAST TECHNIQUE: Multidetector CT imaging of the abdomen and pelvis was performed using the standard protocol following bolus administration of intravenous contrast. CONTRAST:  54mL ISOVUE-300 IOPAMIDOL (ISOVUE-300) INJECTION 61% COMPARISON:  06/11/2005 FINDINGS: Lower chest: Lung bases are clear except for a calcified granuloma posteriorly in the right lower lobe. No pleural or pericardial fluid. Hepatobiliary: Normal Pancreas: Normal Spleen: Normal Adrenals/Urinary Tract: Adrenal glands are normal. There is extensive renal arterial calcification. No evidence of mass, cyst or hydronephrosis. Some focal atrophy at the lower pole of left kidney. Stomach/Bowel: No intestinal abnormality seen. Vascular/Lymphatic: Advanced arterial atherosclerosis. No abdominal aneurysm. Because of the advanced atherosclerotic disease of the superior mesenteric artery, mesenteric ischemia syndrome could occur. Reproductive: Normal Other: No ascites or free air. Bilateral inguinal hernias containing fat. Musculoskeletal: Advanced chronic degenerative changes affecting the lumbar spine. IMPRESSION: No acute organ pathology. Advanced diffuse atherosclerosis including aortic atherosclerosis. Superior mesenteric artery atherosclerosis raising the question if the patient could be experiencing mesenteric ischemia syndrome. No visible bowel pathology on this scan. Bilateral inguinal hernias containing fat. Advanced lower lumbar degenerative disease. Electronically Signed   By: Nelson Chimes M.D.   On: 03/03/2016 13:00    Procedures Procedures (including critical care time)  Medications Ordered in ED Medications  insulin aspart (novoLOG) injection 0-9 Units (not administered)  insulin aspart (novoLOG) injection 0-5 Units (not administered)  sodium chloride 0.9 % bolus 1,000 mL (1,000 mLs Intravenous New Bag/Given 03/03/16 1100)  iopamidol (ISOVUE-300) 61 % injection 30 mL (30 mLs Oral Contrast Given 03/03/16 1109)    iopamidol (ISOVUE-300) 61 % injection 75 mL (75 mLs Intravenous Contrast Given 03/03/16 1247)     Initial Impression / Assessment and Plan / ED Course  I have reviewed the triage vital signs and the nursing notes.  Pertinent labs & imaging results that were available during my care of the patient were reviewed by me and considered in my medical decision making (see chart for details).  Clinical Course    Michael Mathews is a 70 y.o. male here with vertigo, dizziness, ab pain. Hypotensive 80-90s in the  ED. Concerned for diverticulitis vs dehydration vs GI bleed. Occ neg at bedside. Will get labs, UA, CXR, CT head, CT ab/pel   1:21 PM Labs showed Cr 1.6, baseline 1.3. UA showed no infection. CT ab/pel showed diffuse atherosclerosis, ? Mesenteric ischemia but no evidence of active necrosis. Lactate added. Given 1 L NS bolus. BP improved to 130/60. Will admit for dehydration, possible mesenteric ischemia. He already has acute renal failure and got reduced dose of IV contrast so I don't think he can get angiogram currently. Likely need IVF and if still has pain then can get angiogram in the hospital    Final Clinical Impressions(s) / ED Diagnoses   Final diagnoses:  Syncope  CVA (cerebral infarction)    New Prescriptions New Prescriptions   No medications on file     Drenda Freeze, MD 03/03/16 1322

## 2016-03-03 NOTE — ED Triage Notes (Signed)
Has had SHOB on and off for 2 days along with dizziness, headache, and left neck pain. Blood pressure was 130 at eye doctor this week.

## 2016-03-04 ENCOUNTER — Observation Stay
Admit: 2016-03-04 | Discharge: 2016-03-04 | Disposition: A | Discharge status: Home / Self Care | Payer: Medicare Other | Attending: Internal Medicine | Admitting: Internal Medicine

## 2016-03-04 ENCOUNTER — Observation Stay: Payer: Medicare Other

## 2016-03-04 DIAGNOSIS — I739 Peripheral vascular disease, unspecified: Secondary | ICD-10-CM | POA: Diagnosis not present

## 2016-03-04 DIAGNOSIS — R42 Dizziness and giddiness: Secondary | ICD-10-CM | POA: Diagnosis not present

## 2016-03-04 LAB — GLUCOSE, CAPILLARY
GLUCOSE-CAPILLARY: 106 mg/dL — AB (ref 65–99)
GLUCOSE-CAPILLARY: 69 mg/dL (ref 65–99)
GLUCOSE-CAPILLARY: 84 mg/dL (ref 65–99)
GLUCOSE-CAPILLARY: 91 mg/dL (ref 65–99)
Glucose-Capillary: 91 mg/dL (ref 65–99)
Glucose-Capillary: 106 mg/dL — ABNORMAL HIGH (ref 65–99)
Glucose-Capillary: 89 mg/dL (ref 65–99)

## 2016-03-04 LAB — BASIC METABOLIC PANEL
ANION GAP: 4 — AB (ref 5–15)
BUN: 23 mg/dL — AB (ref 6–20)
CHLORIDE: 106 mmol/L (ref 101–111)
CO2: 27 mmol/L (ref 22–32)
Calcium: 8.2 mg/dL — ABNORMAL LOW (ref 8.9–10.3)
Creatinine, Ser: 1.17 mg/dL (ref 0.61–1.24)
GFR calc Af Amer: >60 mL/min (ref >60)
Glucose, Bld: 65 mg/dL (ref 65–99)
POTASSIUM: 3.2 mmol/L — AB (ref 3.5–5.1)
SODIUM: 137 mmol/L (ref 135–145)

## 2016-03-04 LAB — CBC
HEMATOCRIT: 28.2 % — AB (ref 40.0–52.0)
HEMOGLOBIN: 9.4 g/dL — AB (ref 13.0–18.0)
MCH: 30.7 pg (ref 26.0–34.0)
MCHC: 33.3 g/dL (ref 32.0–36.0)
MCV: 92.4 fL (ref 80.0–100.0)
Platelets: 170 10*3/uL (ref 150–440)
RBC: 3.05 MIL/uL — ABNORMAL LOW (ref 4.40–5.90)
RDW: 15.7 % — AB (ref 11.5–14.5)
WBC: 7.2 10*3/uL (ref 3.8–10.6)

## 2016-03-04 LAB — TROPONIN I: Troponin I: <0.03 ng/mL (ref <0.03)

## 2016-03-04 MED ORDER — IOPAMIDOL (ISOVUE-370) INJECTION 76%
100.0000 mL | Freq: Once | INTRAVENOUS | Status: AC | PRN
  Administered 2016-03-04: 100 mL via INTRAVENOUS

## 2016-03-04 MED ORDER — POTASSIUM CHLORIDE CRYS ER 20 MEQ PO TBCR
40.0000 meq | EXTENDED_RELEASE_TABLET | Freq: Once | ORAL | Status: AC
  Administered 2016-03-04: 40 meq via ORAL
  Filled 2016-03-04: qty 2

## 2016-03-04 MED ORDER — TAMSULOSIN HCL 0.4 MG PO CAPS
0.4000 mg | ORAL_CAPSULE | Freq: Every day | ORAL | Status: DC
  Administered 2016-03-04 – 2016-03-05 (×2): 0.4 mg via ORAL
  Filled 2016-03-04 (×2): qty 1

## 2016-03-04 MED ORDER — PANTOPRAZOLE SODIUM 40 MG PO TBEC
40.0000 mg | DELAYED_RELEASE_TABLET | Freq: Every day | ORAL | Status: DC
  Administered 2016-03-04 – 2016-03-05 (×2): 40 mg via ORAL
  Filled 2016-03-04 (×2): qty 1

## 2016-03-04 NOTE — Progress Notes (Signed)
Inpatient Diabetes Program Recommendations  AACE/ADA: New Consensus Statement on Inpatient Glycemic Control (2015)  Target Ranges:  Prepandial:   less than 140 mg/dL      Peak postprandial:   less than 180 mg/dL (1-2 hours)      Critically ill patients:  140 - 180 mg/dL   Lab Results  Component Value Date   GLUCAP 91 03/04/2016    Review of Glycemic Control Results for [name], Michael Mathews (MRN [id number]) as of 03/04/2016 07:42  Ref. Range 03/03/2016 21:00 03/03/2016 22:49 03/04/2016 03:40 03/04/2016 05:23 03/04/2016 07:11  Glucose-Capillary Latest Ref Range: 65 - 99 mg/dL 57 (L) 101 (H) 69 84 91    Inpatient Diabetes Program Recommendations:  Reviewed CBGs. Please consider D/C of Glucotrol while in the hospital and decrease Novolog correction scale to sensitive 0-9 units tid with meals.  Thank you, Nani Gasser. Breanda Greenlaw, RN, MSN, CDE Inpatient Glycemic Control Team Team Pager [phone] (8am-5pm) 03/04/2016 7:44 AM

## 2016-03-04 NOTE — Consult Note (Signed)
Consult Note  Patient name: Michael Mathews MRN: [id number] DOB: 05-10-1946 Sex: male  Consulting Physician:  Caswell Corwin  Reason for Consult:  Chief Complaint  Patient presents with  . Dizziness    HISTORY OF PRESENT ILLNESS: This is a 70 year old gentleman with history of peripheral vascular disease who is admitted with dizziness and abdominal pain.  His pain has been going on for approximately 3-5 days.  He describes it as crampy in the lower abdomen.  He denies postprandial abdominal pain, however he has not had a good appetite for the past week.  He denies any blood in his stool but describes dark-colored stools.  He was guaiac negative in the emergency department.  He reports symptomatic claudication in both legs, the right is worse than the left.  He has seen Dr. Delana Meyer in the past and medical management has been recommended.  He has a follow-up appointment next month.  He continues to smoke.  He does suffer from COPD.  He is a type II diabetic.  His blood sugars have been under adequate control in the hospital.  He suffers from hypertension which is medically managed with a ARB.  He is on a statin for hypercholesterolemia.  Past Medical History:  Diagnosis Date  . Arthritis   . Asthma   . COPD (chronic obstructive pulmonary disease) ([place])   . Cough    chronic  . Diabetes mellitus without complication ([place])   . Edema   . Hypertension   . Orthopnea   . Oxygen deficit    o2 prn  . Shortness of breath dyspnea   . Sleep apnea    cpap    Past Surgical History:  Procedure Laterality Date  . CARDIAC CATHETERIZATION    . COLONOSCOPY    . EYE SURGERY    . KNEE ARTHROSCOPY      Social History   Social History  . Marital status: Unknown    Spouse name: N/A  . Number of children: N/A  . Years of education: N/A   Occupational History  . Not on file.   Social History Main Topics  . Smoking status: Current Every Day Smoker  . Smokeless tobacco: Current User  .  Alcohol use No  . Drug use: Unknown  . Sexual activity: Not on file   Other Topics Concern  . Not on file   Social History Narrative  . No narrative on file    History reviewed. No pertinent family history.  Allergies as of 03/03/2016  . (No Known Allergies)    No current facility-administered medications on file prior to encounter.    Current Outpatient Prescriptions on File Prior to Encounter  Medication Sig Dispense Refill  . atorvastatin (LIPITOR) 10 MG tablet Take 10 mg by mouth daily.    . cloNIDine (CATAPRES) 0.3 MG tablet Take 0.3 mg by mouth daily.    Marland Kitchen glipiZIDE (GLUCOTROL XL) 10 MG 24 hr tablet Take 10 mg by mouth 2 (two) times daily.    . hydrALAZINE (APRESOLINE) 100 MG tablet Take 100 mg by mouth 3 (three) times daily.    . hydrochlorothiazide (HYDRODIURIL) 25 MG tablet Take 25 mg by mouth daily with lunch.     . losartan (COZAAR) 100 MG tablet Take 100 mg by mouth daily.    . metFORMIN (GLUCOPHAGE) 500 MG tablet Take 500 mg by mouth 2 (two) times daily with a meal.       REVIEW OF SYSTEMS:  Constitutional: Negative.  Negative for chills, fever and malaise/fatigue.       Dizziness and lightheadedness  HENT: Negative.  Negative for ear discharge, ear pain, hearing loss, nosebleeds and sore throat.   Eyes: Negative.  Negative for blurred vision and pain.  Respiratory: Negative.  Negative for cough, hemoptysis, shortness of breath and wheezing.   Cardiovascular: Negative.  Negative for chest pain, palpitations and leg swelling.  Gastrointestinal: Positive for abdominal pain and melena. Negative for blood in stool, diarrhea, nausea and vomiting.  Genitourinary: Negative.  Negative for dysuria.  Musculoskeletal: Negative.  Negative for back pain.  Skin: Negative.   Neurological: Negative for dizziness, tremors, speech change, focal weakness, seizures and headaches.  Endo/Heme/Allergies: Negative.  Does not bruise/bleed easily.  Psychiatric/Behavioral: Negative.   Negative for depression, hallucinations and suicidal ideas.   PHYSICAL EXAMINATION: General: The patient appears their stated age.  Vital signs are BP (!) 118/54   Pulse 66   Temp 98 F (36.7 C) (Oral)   Resp 16   Ht 5\' 10"  (1.778 m)   Wt 242 lb 8 oz (110 kg)   SpO2 96%   BMI 34.80 kg/m  Pulmonary: Respirations are non-labored HEENT:  No gross abnormalities Abdomen: Soft.  Mild lower quadrant tenderness.  No peritoneal signs  Musculoskeletal: There are no major deformities.   Neurologic: No focal weakness or paresthesias are detected, Skin: There are no ulcer or rashes noted. Psychiatric: The patient has normal affect. Cardiovascular: There is a regular rate and rhythm.  I cannot palpate pedal pulses.  He has a right femoral pulse.  The left femoral pulses difficult to palpate.  Diagnostic Studies: I have reviewed his CT angiogram which has not been formally read.  All 3 mesenteric vessels appear to be patent without hemodynamically significant stenosis   Assessment:  #1: Abdominal pain 2: Claudication Plan: #1: Based on the description of the patient's symptoms as well as after reviewing his CT angiogram which does not show any hemodynamically significant mesenteric stenosis, I do not think his symptoms are related to mesenteric ischemia.  I would pursue a GI workup for the etiology of his abdominal pain.  #2: The patient suffers from claudication.  His symptoms have been stable and are somewhat lifestyle limiting.  He will keep his regular scheduled follow-up appointment with Dr. Delana Meyer next month     V. Leia Alf, M.D. [facility] Office: [phone] Pager:  [phone]

## 2016-03-04 NOTE — Progress Notes (Signed)
Physical Therapy Evaluation Patient Details Name: Michael Mathews MRN: [id number] DOB: 1946/03/01 Today's Date: 03/04/2016   History of Present Illness  Michael Mathews  is a 70 y.o. male with a known history of Peripheral vascular disease, diabetes, OSA on CPAP and morbid obesity who presents with dizziness and abdominal pain.  Clinical Impression  Pt presents to PT at baseline functional mobility.  Pt able to get in and out of bed independently, transfers sit<>stand independently, and ambulates on level surface without assistive device with mod I.  Pt without s/s dizziness throughout session.  Pt has no f/u or equipment needs.    Follow Up Recommendations No PT follow up    Equipment Recommendations  None recommended by PT    Recommendations for Other Services       Precautions / Restrictions Precautions Precautions: Fall Precaution Comments: MOD Restrictions Weight Bearing Restrictions: No      Mobility  Bed Mobility Overal bed mobility: Modified Independent             General bed mobility comments: Supine to sit with HOB slightly elevated, good pacing and sequencing.  Transfers Overall transfer level: Modified independent Equipment used: None             General transfer comment: Sit<>stand with good body mechanics and safety awareness; good balance  Ambulation/Gait Ambulation/Gait assistance: Modified independent (Device/Increase time) Ambulation Distance (Feet): 300 Feet   Gait Pattern/deviations: Step-through pattern     General Gait Details: Steady gait with widened BOS, good cadence, no balance deviations, and no s/s dizziness.  Stairs            Wheelchair Mobility    Modified Rankin (Stroke Patients Only)       Balance Overall balance assessment: Modified Independent                                           Pertinent Vitals/Pain Pain Assessment: 0-10 Pain Score: 3  Pain Location: headache, all over Pain  Descriptors / Indicators: Constant Pain Intervention(s): Monitored during session    Home Living Family/patient expects to be discharged to:: Private residence Living Arrangements: Alone Available Help at Discharge: Neighbor Type of Home: Mobile home Home Access: Stairs to enter Entrance Stairs-Rails: Left Entrance Stairs-Number of Steps: 7 Home Layout: One level        Prior Function Level of Independence: Independent         Comments: Independent wihtout device, drives, does own cooking/cleaning no yard work     Journalist, newspaper        Extremity/Trunk Assessment   Upper Extremity Assessment: Overall WFL for tasks assessed           Lower Extremity Assessment: Overall WFL for tasks assessed         Communication   Communication: No difficulties  Cognition Arousal/Alertness: Awake/alert Behavior During Therapy: WFL for tasks assessed/performed Overall Cognitive Status: Within Functional Limits for tasks assessed                      General Comments      Exercises     Assessment/Plan    PT Assessment Patent does not need any further PT services  PT Problem List            PT Treatment Interventions      PT Goals (Current goals can be found in  the Care Plan section)  Acute Rehab PT Goals Patient Stated Goal: To go home. PT Goal Formulation: With patient Time For Goal Achievement: 03/06/16 Potential to Achieve Goals: Good    Frequency     Barriers to discharge        Co-evaluation               End of Session Equipment Utilized During Treatment: Gait belt Activity Tolerance: Patient tolerated treatment well Patient left: in bed;with call bell/phone within reach;with bed alarm set;with family/visitor present Nurse Communication: Mobility status    Functional Assessment Tool Used: Clinical judgement; AMPAC 24/24 Functional Limitation: Mobility: Walking and moving around Mobility: Walking and Moving Around Current Status  [id number]): At least 1 percent but less than 20 percent impaired, limited or restricted Mobility: Walking and Moving Around Goal Status [phone]): At least 1 percent but less than 20 percent impaired, limited or restricted Mobility: Walking and Moving Around Discharge Status [phone]): At least 1 percent but less than 20 percent impaired, limited or restricted    Time: 1325-1350 PT Time Calculation (min) (ACUTE ONLY): 25 min   Charges:   PT Evaluation $PT Eval Low Complexity: 1 Procedure PT Treatments $Therapeutic Exercise: 8-22 mins   PT G Codes:   PT G-Codes **NOT FOR INPATIENT CLASS** Functional Assessment Tool Used: Clinical judgement; AMPAC 24/24 Functional Limitation: Mobility: Walking and moving around Mobility: Walking and Moving Around Current Status [id number]): At least 1 percent but less than 20 percent impaired, limited or restricted Mobility: Walking and Moving Around Goal Status [phone]): At least 1 percent but less than 20 percent impaired, limited or restricted Mobility: Walking and Moving Around Discharge Status [phone]): At least 1 percent but less than 20 percent impaired, limited or restricted    Criag Wicklund A Maddoxx Burkitt 03/04/2016, 1:57 PM

## 2016-03-04 NOTE — Progress Notes (Signed)
[place] at [facility]                                                                                                                                                                                            Patient Demographics   Michael Mathews, is a 70 y.o. male, DOB - March 17, 1946, [id number]  Admit date - 03/03/2016   Admitting Physician Bettey Costa, MD  Outpatient Primary MD for the patient is AYCOCK, NGWE A, MD   LOS - 0  Subjective:  Patient admitted with multiple complaints including dizziness, abdominal pain He reports that his dizziness is improved. Abdominal pain is improved as well. He reports that the abdominal pain has been going past few days. He had a CT scan which suggested of mesenteric vessel disease.    Review of Systems:   CONSTITUTIONAL: No documented fever.Positive fatigue, positive weakness. No weight gain, no weight loss.  EYES: No blurry or double vision.  ENT: No tinnitus. No postnasal drip. No redness of the oropharynx.  RESPIRATORY: No cough, no wheeze, no hemoptysis. No dyspnea.  CARDIOVASCULAR: No chest pain. No orthopnea. No palpitations. No syncope.  GASTROINTESTINAL: No nausea, no vomiting or diarrhea. Positive abdominal pain. No melena or hematochezia.  GENITOURINARY: No dysuria or hematuria. Positive for urinary frequency ENDOCRINE: No polyuria or nocturia. No heat or cold intolerance.  HEMATOLOGY: No anemia. No bruising. No bleeding.  INTEGUMENTARY: No rashes. No lesions.  MUSCULOSKELETAL: No arthritis. No swelling. No gout.  NEUROLOGIC: No numbness, tingling, or ataxia. No seizure-type activity.  PSYCHIATRIC: No anxiety. No insomnia. No ADD.    Vitals:   Vitals:   03/04/16 1156 03/04/16 1158 03/04/16 1159 03/04/16 1201  BP: (!) 156/74 (!) 124/54 (!) 122/52 (!) 118/54  Pulse: (!) 58 63 66 66  Resp:      Temp:      TempSrc:      SpO2: 97% 99% 96% 96%  Weight:      Height:        Wt Readings from  Last 3 Encounters:  03/03/16 242 lb 8 oz (110 kg)     Intake/Output Summary (Last 24 hours) at 03/04/16 1332 Last data filed at 03/04/16 1220  Gross per 24 hour  Intake             2365 ml  Output             2305 ml  Net               60 ml    Physical Exam:   GENERAL: Pleasant-appearing in no apparent distress.  HEAD,  EYES, EARS, NOSE AND THROAT: Atraumatic, normocephalic. Extraocular muscles are intact. Pupils equal and reactive to light. Sclerae anicteric. No conjunctival injection. No oro-pharyngeal erythema.  NECK: Supple. There is no jugular venous distention. No bruits, no lymphadenopathy, no thyromegaly.  HEART: Regular rate and rhythm,. No murmurs, no rubs, no clicks.  LUNGS: Clear to auscultation bilaterally. No rales or rhonchi. No wheezes.  ABDOMEN: Soft, flat, nontender, nondistended. Has good bowel sounds. No hepatosplenomegaly appreciated.  EXTREMITIES: No evidence of any cyanosis, clubbing, or peripheral edema.  +2 pedal and radial pulses bilaterally.  NEUROLOGIC: The patient is alert, awake, and oriented x3 with no focal motor or sensory deficits appreciated bilaterally.  SKIN: Moist and warm with no rashes appreciated.  Psych: Not anxious, depressed LN: No inguinal LN enlargement    Antibiotics   Anti-infectives    None      Medications   Scheduled Meds: . aspirin EC  325 mg Oral BH-q7a  . atorvastatin  10 mg Oral Daily  . cloNIDine  0.3 mg Oral Daily  . DULoxetine  30 mg Oral Daily  . enoxaparin (LOVENOX) injection  40 mg Subcutaneous Q24H  . hydrALAZINE  100 mg Oral TID  . insulin aspart  0-20 Units Subcutaneous TID WC  . insulin aspart  0-5 Units Subcutaneous QHS  . losartan  100 mg Oral Daily  . mometasone-formoterol  2 puff Inhalation BID  . pantoprazole  40 mg Oral Daily  . sodium chloride flush  3 mL Intravenous Q12H  . tamsulosin  0.4 mg Oral Daily  . terazosin  10 mg Oral QHS   Continuous Infusions: . sodium chloride 100 mL/hr at  03/03/16 1624   PRN Meds:.acetaminophen **OR** acetaminophen, meclizine, ondansetron **OR** ondansetron (ZOFRAN) IV, senna-docusate   Data Review:   Micro Results No results found for this or any previous visit (from the past 240 hour(s)).  Radiology Reports Dg Chest 2 View  Result Date: 03/03/2016 CLINICAL DATA:  Shortness of breath with weakness, hypertension and abdominal pain. EXAM: CHEST  2 VIEW COMPARISON:  08/16/2008 FINDINGS: Old right seventh rib fracture. Age-indeterminate fracture involving the right ninth rib. Subtle densities at the right lung base could represent atelectasis. No significant airspace disease. Heart and mediastinum are within normal limits and stable. Trachea is midline. Again noted is a sclerotic lesion in the proximal left humerus that is suggestive for an enchondroma. No significant pleural effusions. IMPRESSION: No acute chest abnormality. Presumed old right rib fractures. Electronically Signed   By: Markus Daft M.D.   On: 03/03/2016 11:09   Ct Head Wo Contrast  Result Date: 03/03/2016 CLINICAL DATA:  Headache, dizziness and neck pain this week. EXAM: CT HEAD WITHOUT CONTRAST TECHNIQUE: Contiguous axial images were obtained from the base of the skull through the vertex without intravenous contrast. COMPARISON:  Head CT 08/16/2008 FINDINGS: Brain: Stable age related cerebral atrophy, ventriculomegaly and periventricular white matter disease. No extra-axial fluid collections are identified. No CT findings for acute hemispheric infarction or intracranial hemorrhage. No mass lesions. The brainstem and cerebellum are normal. Vascular: Moderate vascular calcifications. No obvious aneurysm or worrisome hyperdense vessels. Skull: Fell skull fracture or bone lesion. Sinuses/Orbits: The paranasal sinuses and mastoid air cells are grossly clear. The globes are intact. Other: No scalp lesion or hematoma. IMPRESSION: Slightly progressive age related cerebral atrophy,  ventriculomegaly and periventricular white matter disease. No acute intracranial findings or mass lesion. Electronically Signed   By: Marijo Sanes M.D.   On: 03/03/2016 12:58   Ct Abdomen  Pelvis W Contrast  Result Date: 03/03/2016 CLINICAL DATA:  Weakness, hypotension and abdominal pain. EXAM: CT ABDOMEN AND PELVIS WITH CONTRAST TECHNIQUE: Multidetector CT imaging of the abdomen and pelvis was performed using the standard protocol following bolus administration of intravenous contrast. CONTRAST:  100mL ISOVUE-300 IOPAMIDOL (ISOVUE-300) INJECTION 61% COMPARISON:  06/11/2005 FINDINGS: Lower chest: Lung bases are clear except for a calcified granuloma posteriorly in the right lower lobe. No pleural or pericardial fluid. Hepatobiliary: Normal Pancreas: Normal Spleen: Normal Adrenals/Urinary Tract: Adrenal glands are normal. There is extensive renal arterial calcification. No evidence of mass, cyst or hydronephrosis. Some focal atrophy at the lower pole of left kidney. Stomach/Bowel: No intestinal abnormality seen. Vascular/Lymphatic: Advanced arterial atherosclerosis. No abdominal aneurysm. Because of the advanced atherosclerotic disease of the superior mesenteric artery, mesenteric ischemia syndrome could occur. Reproductive: Normal Other: No ascites or free air. Bilateral inguinal hernias containing fat. Musculoskeletal: Advanced chronic degenerative changes affecting the lumbar spine. IMPRESSION: No acute organ pathology. Advanced diffuse atherosclerosis including aortic atherosclerosis. Superior mesenteric artery atherosclerosis raising the question if the patient could be experiencing mesenteric ischemia syndrome. No visible bowel pathology on this scan. Bilateral inguinal hernias containing fat. Advanced lower lumbar degenerative disease. Electronically Signed   By: Nelson Chimes M.D.   On: 03/03/2016 13:00   US Carotid Bilateral  Result Date: 03/03/2016 CLINICAL DATA:  Stroke symptoms, hypertension,  syncope and hyperlipidemia. History of diabetes and tobacco use. EXAM: BILATERAL CAROTID DUPLEX ULTRASOUND TECHNIQUE: Pearline Cables scale imaging, color Doppler and duplex ultrasound were performed of bilateral carotid and vertebral arteries in the neck. COMPARISON:  03/03/2016 head CT FINDINGS: Criteria: Quantification of carotid stenosis is based on velocity parameters that correlate the residual internal carotid diameter with NASCET-based stenosis levels, using the diameter of the distal internal carotid lumen as the denominator for stenosis measurement. The following velocity measurements were obtained: RIGHT ICA:  169/31 cm/sec CCA:  [id number] cm/sec SYSTOLIC ICA/CCA RATIO:  1.8 DIASTOLIC ICA/CCA RATIO:  2.9 ECA:  198 cm/sec LEFT ICA:  101/39 cm/sec CCA:  [id number] cm/sec SYSTOLIC ICA/CCA RATIO:  1.0 DIASTOLIC ICA/CCA RATIO:  1.9 ECA:  94 cm/sec RIGHT CAROTID ARTERY: Mild-to-moderate heterogeneous partially calcified right carotid system atherosclerosis. Slight tortuosity of the right ICA. No significant luminal narrowing by grayscale imaging. There is slight proximal right ICA velocity elevation measure 169/31 cm/sec which appears to be secondary to mild tortuosity rather than a significant stenosis. Degree of narrowing estimated less than 50%. RIGHT VERTEBRAL ARTERY:  Antegrade LEFT CAROTID ARTERY: Similar moderate heterogeneous and partially calcified carotid atherosclerosis at the bifurcation. Despite this, no hemodynamically significant left ICA stenosis, velocity elevation, or turbulent flow. Degree of narrowing also less than 50%. LEFT VERTEBRAL ARTERY:  Antegrade IMPRESSION: Mild-to-moderate bilateral carotid atherosclerosis. Slight right ICA tortuosity. No hemodynamically significant stenosis, degree of narrowing less than 50% bilaterally. Patent antegrade vertebral flow bilaterally Electronically Signed   By: Jerilynn Mages.  Shick M.D.   On: 03/03/2016 14:34   Ct Angio Abd/pel W/ And/or W/o  Result Date: 03/04/2016 CLINICAL  DATA:  Abdominal pain EXAM: CTA ABDOMEN AND PELVIS wITHOUT AND WITH CONTRAST TECHNIQUE: Multidetector CT imaging of the abdomen and pelvis was performed using the standard protocol during bolus administration of intravenous contrast. Multiplanar reconstructed images and MIPs were obtained and reviewed to evaluate the vascular anatomy. CONTRAST:  100 cc Isovue 370 COMPARISON:  Yesterday FINDINGS: VASCULAR Aorta: Maximal diameter of the aorta at the diaphragmatic hiatus is 3.9 cm. Diffuse irregular atherosclerotic plaque is present throughout the abdominal aorta. No evidence  of dissection. Atherosclerotic calcifications are also present. Celiac: Atherosclerotic calcifications at the origin. No significant narrowing. Branch vessels patent. Accessory left hepatic artery anatomy. SMA: Scattered atherosclerotic calcifications. Origin is patent. Mid SMA is patent. Atherosclerotic changes of the distal SMA are noted. Renals: Single renal arteries are patent with diffuse atherosclerotic calcifications. IMA: Moderate disease at the origin. It is there after patent with diffuse atherosclerotic changes. Inflow: Diffuse atherosclerotic plaque and calcification without significant focal narrowing in the right common, internal and external iliac arteries. Diffuse atherosclerotic calcifications of the left common, internal, and external iliac arteries. Maximal caliber of the left common iliac artery is 1.7 cm. Proximal Outflow: Atherosclerotic changes of the right proximal femoral arteries are noted. There is extensive calcification in the proximal right superficial femoral artery with suspected significant narrowing. No significant narrowing in the visualized proximal left femoral arterial system. Extensive atherosclerotic calcifications are noted. Veins: Hepatic, portal, splenic, superior mesenteric, and renal veins are patent. Review of the MIP images confirms the above findings. NON-VASCULAR Lower chest: Dependent atelectasis.  Three vessel coronary artery calcification. Hepatobiliary: Calcified granulomata in the liver. Sludge in the gallbladder. Pancreas: Unremarkable Spleen: Calcified granulomata Adrenals/Urinary Tract: Adrenal glands are unremarkable. Chronic changes of the kidneys with bilateral scarring. Tiny hypodensity in the right kidney is too small to characterize but has a benign appearance. Stomach/Bowel: Normal appendix. No obvious mass in the colon. Descending and sigmoid colon are decompressed. Diverticulosis of the descending and sigmoid colon. Vascular/Lymphatic: No abnormal retroperitoneal adenopathy. Reproductive: Bilateral inguinal hernia contains adipose tissue. No free-fluid. Other: Bladder and prostate are unremarkable. Musculoskeletal: Sclerotic changes are present throughout the lower thoracic and lumbar vertebral bodies worrisome for sclerotic metastatic disease. No vertebral compression deformity. Advanced degenerative disc disease results in spinal stenosis in the lumbar spine. IMPRESSION: VASCULAR Upper abdominal aorta is 3.9 cm in caliber. Atherosclerotic changes of the celiac and SMA without significant focal narrowing. Moderate narrowing at the origin of the IMA. This is not likely significant given the small caliber of the vessel. 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 Significant narrowing at the origin of the right superficial femoral artery. Correlate with a history of claudication. 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 Bone scan or MRI may be helpful. Correlate with PSA and prostate examination. Electronically Signed   By: Marybelle Killings M.D.   On: 03/04/2016 13:04     CBC  Recent Labs Lab 03/03/16 1032 03/04/16 0057  WBC 6.4 7.2  HGB 9.8* 9.4*  HCT 29.3* 28.2*  PLT 185 170  MCV 92.8 92.4  MCH 30.9 30.7  MCHC 33.3 33.3  RDW 15.8* 15.7*    Chemistries   Recent Labs Lab  03/03/16 1032 03/04/16 0057  NA 137 137  K 3.5 3.2*  CL 105 106  CO2 25 27  GLUCOSE 120* 65  BUN 28* 23*  CREATININE 1.62* 1.17  CALCIUM 8.4* 8.2*  AST 15  --   ALT 10*  --   ALKPHOS 241*  --   BILITOT 0.7  --    ------------------------------------------------------------------------------------------------------------------ estimated creatinine clearance is 71.9 mL/min (by C-G formula based on SCr of 1.17 mg/dL). ------------------------------------------------------------------------------------------------------------------ No results for input(s): HGBA1C in the last 72 hours. ------------------------------------------------------------------------------------------------------------------ No results for input(s): CHOL, HDL, LDLCALC, TRIG, CHOLHDL, LDLDIRECT in the last 72 hours. ------------------------------------------------------------------------------------------------------------------  Recent Labs  03/03/16 1620  TSH 1.388   ------------------------------------------------------------------------------------------------------------------ No results for input(s): VITAMINB12, FOLATE, FERRITIN, TIBC, IRON, RETICCTPCT in  the last 72 hours.  Coagulation profile No results for input(s): INR, PROTIME in the last 168 hours.  No results for input(s): DDIMER in the last 72 hours.  Cardiac Enzymes  Recent Labs Lab 03/03/16 1620 03/03/16 1912 03/04/16 0057  TROPONINI <0.03 <0.03 <0.03   ------------------------------------------------------------------------------------------------------------------ Invalid input(s): POCBNP    Assessment & Plan   70 year old male with peripheral vascular disease and morbid obesity who presents with dizziness and lightheadedness as well as abdominal pain and dark color stools guaiac negative.,  1. Dizziness/lightheadedness without neurological deficits:  Suspect due to combination of benign positional vertigo and  hypotension Symptoms are improved Continue supportive care  2. Abdominal pain  Possibly due to mesenteric ischemia CT of the abdomen with angio has been ordered Place him on PPIs   3. Diabetes: Continue sliding scale insulin hypoglycemia Hold his oral regimen   4. Acute kidney injury: improvement with IV hydration   5. Essential hypertension: Continue outpatient medications including  clonidine, hydralazine and losartan.  6. BPH: Continue Hytrin still very symptomatic at [place]  7. Hyperlipidemia: Continue Lipitor   8. Tobacco dependensmoking cessation provided yesterday  9. OSA: CPAP ordered  10. COPD: Patient is not in exacerbation Continue inhalers All the records are reviewed and case discussed with ED provider. Management plans discussed with the patient and he in agreement  CODE STATUS: DNR     Code Status Orders        Start     Ordered   03/03/16 1537  Do not attempt resuscitation (DNR)  Continuous    Question Answer Comment  In the event of cardiac or respiratory ARREST Do not call a "code blue"   In the event of cardiac or respiratory ARREST Do not perform Intubation, CPR, defibrillation or ACLS   In the event of cardiac or respiratory ARREST Use medication by any route, position, wound care, and other measures to relive pain and suffering. May use oxygen, suction and manual treatment of airway obstruction as needed for comfort.      03/03/16 1536    Code Status History    Date Active Date Inactive Code Status Order ID Comments User Context   03/03/2016  3:36 PM 03/04/2016  7:06 AM DNR [id number]  Bettey Costa, MD Inpatient    Questions for Most Recent Historical Code Status (Order [id number])    Question Answer Comment   In the event of cardiac or respiratory ARREST Do not call a "code blue"    In the event of cardiac or respiratory ARREST Do not perform Intubation, CPR, defibrillation or ACLS    In the event of cardiac or respiratory ARREST Use  medication by any route, position, wound care, and other measures to relive pain and suffering. May use oxygen, suction and manual treatment of airway obstruction as needed for comfort.            Consults nonr  DVT Prophylaxis  Lovenox   Lab Results  Component Value Date   PLT 170 03/04/2016     Time Spent in minutes  50min  Greater than 50% of time spent in care coordination and counseling patient regarding the condition and plan of care.   Dustin Flock M.D on 03/04/2016 at 1:32 PM  Between 7am to 6pm - Pager - [phone]  After 6pm go to [email or web address] - password [email or web address] [facility]   Office  [phone]

## 2016-03-04 NOTE — Progress Notes (Signed)
Patient blood sugar 65 via morning lab work. Given snack. Rechecked 85. Md notified. CBG check frequency changed to every four hours. Will monitor

## 2016-03-04 NOTE — Clinical Social Work Note (Signed)
CSW attempted to visit patient at bedside; patient was not in room. CSW will con't to attempt to discuss consult for home alone.  Santiago Bumpers, MSW, LCSW-A [phone]

## 2016-03-04 NOTE — Progress Notes (Signed)
Ortho Bs on VS flow sheet

## 2016-03-04 NOTE — Consult Note (Signed)
[facility] Consultation Note  Patient ID: Michael Mathews, MRN: [id number], DOB/AGE: 71-Feb-1947 71 y.o. Admit date: 03/03/2016   Date of Consult: 03/04/2016 Primary Physician: [name], MD Primary Cardiologist: Nehemiah Massed  Chief Complaint:  Chief Complaint  Patient presents with  . Dizziness   Reason for Consult: dizziness and presyncope  HPI: 70 y.o. male with known diabetes with complication chronic obstructive pulmonary disease with asthma essential hypertension mixed hyperlipidemia and sleep apnea with known coronary atherosclerosis by previous cardiac catheterization having new onset of the issues of syncope and presyncope with dizziness over the last 3-4 days waxing and waning causing him not to be able to do the things she wishes with shortness of breath. There is been no evidence of heart failure type symptoms and or anginal type symptoms. Patient did have an echocardiogram 6 months prior showing normal LV systolic function and a normal stress test without evidence of myocardial ischemia. The patient has had slight improvements since admission. He did have an EKG showing sinus bradycardia otherwise normal EKG and no evidence of elevation of troponin or myocardial infarction. After discontinuation of metoprolol his heart rate is improved at this time at 60 bpm but may still have some sick sinus syndrome related to clonidine use  Past Medical History:  Diagnosis Date  . Arthritis   . Asthma   . COPD (chronic obstructive pulmonary disease) ([place])   . Cough    chronic  . Diabetes mellitus without complication ([place])   . Edema   . Hypertension   . Orthopnea   . Oxygen deficit    o2 prn  . Shortness of breath dyspnea   . Sleep apnea    cpap      Surgical History:  Past Surgical History:  Procedure Laterality Date  . CARDIAC CATHETERIZATION    . COLONOSCOPY    . EYE SURGERY    . KNEE ARTHROSCOPY       Home Meds: Prior to Admission medications   Medication  Sig Start Date End Date Taking? Authorizing Provider  aspirin EC 325 MG tablet Take 325 mg by mouth every morning.   Yes Historical Provider, MD  atorvastatin (LIPITOR) 10 MG tablet Take 10 mg by mouth daily.   Yes Historical Provider, MD  cloNIDine (CATAPRES) 0.3 MG tablet Take 0.3 mg by mouth daily.   Yes Historical Provider, MD  DULoxetine (CYMBALTA) 30 MG capsule Take 30 mg by mouth daily. 01/04/16  Yes Historical Provider, MD  Fluticasone-Salmeterol (ADVAIR) 250-50 MCG/DOSE AEPB Inhale 1 puff into the lungs 2 (two) times daily.   Yes Historical Provider, MD  glipiZIDE (GLUCOTROL XL) 10 MG 24 hr tablet Take 10 mg by mouth 2 (two) times daily.   Yes Historical Provider, MD  hydrALAZINE (APRESOLINE) 100 MG tablet Take 100 mg by mouth 3 (three) times daily.   Yes Historical Provider, MD  hydrochlorothiazide (HYDRODIURIL) 25 MG tablet Take 25 mg by mouth daily with lunch.    Yes Historical Provider, MD  losartan (COZAAR) 100 MG tablet Take 100 mg by mouth daily.   Yes Historical Provider, MD  metFORMIN (GLUCOPHAGE) 500 MG tablet Take 500 mg by mouth 2 (two) times daily with a meal.   Yes Historical Provider, MD  metoprolol (LOPRESSOR) 50 MG tablet Take 50 mg by mouth 2 (two) times daily.   Yes Historical Provider, MD  terazosin (HYTRIN) 10 MG capsule Take 10 mg by mouth at bedtime.    Yes Historical Provider, MD  Inpatient Medications:  . aspirin EC  325 mg Oral [id number]  . atorvastatin  10 mg Oral Daily  . cloNIDine  0.3 mg Oral Daily  . DULoxetine  30 mg Oral Daily  . enoxaparin (LOVENOX) injection  40 mg Subcutaneous Q24H  . hydrALAZINE  100 mg Oral TID  . insulin aspart  0-20 Units Subcutaneous TID WC  . insulin aspart  0-5 Units Subcutaneous QHS  . losartan  100 mg Oral Daily  . mometasone-formoterol  2 puff Inhalation BID  . pantoprazole  40 mg Oral Daily  . sodium chloride flush  3 mL Intravenous Q12H  . tamsulosin  0.4 mg Oral Daily  . terazosin  10 mg Oral QHS   . sodium  chloride 100 mL/hr at 03/03/16 1624    Allergies: No Known Allergies  Social History   Social History  . Marital status: Unknown    Spouse name: N/A  . Number of children: N/A  . Years of education: N/A   Occupational History  . Not on file.   Social History Main Topics  . Smoking status: Current Every Day Smoker  . Smokeless tobacco: Current User  . Alcohol use No  . Drug use: Unknown  . Sexual activity: Not on file   Other Topics Concern  . Not on file   Social History Narrative  . No narrative on file     History reviewed. No pertinent family history.   Review of Systems Positive for Dizziness weakness and presyncope Negative for: General:  chills, fever, night sweats or weight changes.  Cardiovascular: PND orthopnea positive for syncope dizziness  Dermatological skin lesions rashes Respiratory: Cough congestion Urologic: Frequent urination urination at night and hematuria Abdominal: negative for nausea, vomiting, diarrhea, bright red blood per rectum, melena, or hematemesis Neurologic: negative for visual changes, and/or hearing changes  All other systems reviewed and are otherwise negative except as noted above.  Labs:  Recent Labs  03/03/16 1032 03/03/16 1620 03/03/16 1912 03/04/16 0057  TROPONINI <0.03 <0.03 <0.03 <0.03   Lab Results  Component Value Date   WBC 7.2 03/04/2016   HGB 9.4 (L) 03/04/2016   HCT 28.2 (L) 03/04/2016   MCV 92.4 03/04/2016   PLT 170 03/04/2016    Recent Labs Lab 03/03/16 1032 03/04/16 0057  NA 137 137  K 3.5 3.2*  CL 105 106  CO2 25 27  BUN 28* 23*  CREATININE 1.62* 1.17  CALCIUM 8.4* 8.2*  PROT 7.0  --   BILITOT 0.7  --   ALKPHOS 241*  --   ALT 10*  --   AST 15  --   GLUCOSE 120* 65   No results found for: CHOL, HDL, LDLCALC, TRIG No results found for: DDIMER  Radiology/Studies:  Dg Chest 2 View  Result Date: 03/03/2016 CLINICAL DATA:  Shortness of breath with weakness, hypertension and abdominal  pain. EXAM: CHEST  2 VIEW COMPARISON:  08/16/2008 FINDINGS: Old right seventh rib fracture. Age-indeterminate fracture involving the right ninth rib. Subtle densities at the right lung base could represent atelectasis. No significant airspace disease. Heart and mediastinum are within normal limits and stable. Trachea is midline. Again noted is a sclerotic lesion in the proximal left humerus that is suggestive for an enchondroma. No significant pleural effusions. IMPRESSION: No acute chest abnormality. Presumed old right rib fractures. Electronically Signed   By: Markus Daft M.D.   On: 03/03/2016 11:09   Ct Head Wo Contrast  Result Date: 03/03/2016 CLINICAL DATA:  Headache,  dizziness and neck pain this week. EXAM: CT HEAD WITHOUT CONTRAST TECHNIQUE: Contiguous axial images were obtained from the base of the skull through the vertex without intravenous contrast. COMPARISON:  Head CT 08/16/2008 FINDINGS: Brain: Stable age related cerebral atrophy, ventriculomegaly and periventricular white matter disease. No extra-axial fluid collections are identified. No CT findings for acute hemispheric infarction or intracranial hemorrhage. No mass lesions. The brainstem and cerebellum are normal. Vascular: Moderate vascular calcifications. No obvious aneurysm or worrisome hyperdense vessels. Skull: Fell skull fracture or bone lesion. Sinuses/Orbits: The paranasal sinuses and mastoid air cells are grossly clear. The globes are intact. Other: No scalp lesion or hematoma. IMPRESSION: Slightly progressive age related cerebral atrophy, ventriculomegaly and periventricular white matter disease. No acute intracranial findings or mass lesion. Electronically Signed   By: Marijo Sanes M.D.   On: 03/03/2016 12:58   Ct Abdomen Pelvis W Contrast  Result Date: 03/03/2016 CLINICAL DATA:  Weakness, hypotension and abdominal pain. EXAM: CT ABDOMEN AND PELVIS WITH CONTRAST TECHNIQUE: Multidetector CT imaging of the abdomen and pelvis was  performed using the standard protocol following bolus administration of intravenous contrast. CONTRAST:  60mL ISOVUE-300 IOPAMIDOL (ISOVUE-300) INJECTION 61% COMPARISON:  06/11/2005 FINDINGS: Lower chest: Lung bases are clear except for a calcified granuloma posteriorly in the right lower lobe. No pleural or pericardial fluid. Hepatobiliary: Normal Pancreas: Normal Spleen: Normal Adrenals/Urinary Tract: Adrenal glands are normal. There is extensive renal arterial calcification. No evidence of mass, cyst or hydronephrosis. Some focal atrophy at the lower pole of left kidney. Stomach/Bowel: No intestinal abnormality seen. Vascular/Lymphatic: Advanced arterial atherosclerosis. No abdominal aneurysm. Because of the advanced atherosclerotic disease of the superior mesenteric artery, mesenteric ischemia syndrome could occur. Reproductive: Normal Other: No ascites or free air. Bilateral inguinal hernias containing fat. Musculoskeletal: Advanced chronic degenerative changes affecting the lumbar spine. IMPRESSION: No acute organ pathology. Advanced diffuse atherosclerosis including aortic atherosclerosis. Superior mesenteric artery atherosclerosis raising the question if the patient could be experiencing mesenteric ischemia syndrome. No visible bowel pathology on this scan. Bilateral inguinal hernias containing fat. Advanced lower lumbar degenerative disease. Electronically Signed   By: Nelson Chimes M.D.   On: 03/03/2016 13:00   US Carotid Bilateral  Result Date: 03/03/2016 CLINICAL DATA:  Stroke symptoms, hypertension, syncope and hyperlipidemia. History of diabetes and tobacco use. EXAM: BILATERAL CAROTID DUPLEX ULTRASOUND TECHNIQUE: Pearline Cables scale imaging, color Doppler and duplex ultrasound were performed of bilateral carotid and vertebral arteries in the neck. COMPARISON:  03/03/2016 head CT FINDINGS: Criteria: Quantification of carotid stenosis is based on velocity parameters that correlate the residual internal  carotid diameter with NASCET-based stenosis levels, using the diameter of the distal internal carotid lumen as the denominator for stenosis measurement. The following velocity measurements were obtained: RIGHT ICA:  169/31 cm/sec CCA:  [id number] cm/sec SYSTOLIC ICA/CCA RATIO:  1.8 DIASTOLIC ICA/CCA RATIO:  2.9 ECA:  198 cm/sec LEFT ICA:  101/39 cm/sec CCA:  [id number] cm/sec SYSTOLIC ICA/CCA RATIO:  1.0 DIASTOLIC ICA/CCA RATIO:  1.9 ECA:  94 cm/sec RIGHT CAROTID ARTERY: Mild-to-moderate heterogeneous partially calcified right carotid system atherosclerosis. Slight tortuosity of the right ICA. No significant luminal narrowing by grayscale imaging. There is slight proximal right ICA velocity elevation measure 169/31 cm/sec which appears to be secondary to mild tortuosity rather than a significant stenosis. Degree of narrowing estimated less than 50%. RIGHT VERTEBRAL ARTERY:  Antegrade LEFT CAROTID ARTERY: Similar moderate heterogeneous and partially calcified carotid atherosclerosis at the bifurcation. Despite this, no hemodynamically significant left ICA stenosis, velocity elevation, or turbulent  flow. Degree of narrowing also less than 50%. LEFT VERTEBRAL ARTERY:  Antegrade IMPRESSION: Mild-to-moderate bilateral carotid atherosclerosis. Slight right ICA tortuosity. No hemodynamically significant stenosis, degree of narrowing less than 50% bilaterally. Patent antegrade vertebral flow bilaterally Electronically Signed   By: Jerilynn Mages.  Shick M.D.   On: 03/03/2016 14:34    EKG: Sinus bradycardia with nonspecific ST changes  Weights: Filed Weights   03/03/16 1016 03/03/16 1522  Weight: 245 lb (111.1 kg) 242 lb 8 oz (110 kg)     Physical Exam: Blood pressure (!) 150/64, pulse 62, temperature 98.3 F (36.8 C), temperature source Oral, resp. rate 18, height 5\' 10"  (1.778 m), weight 242 lb 8 oz (110 kg), SpO2 94 %. Body mass index is 34.8 kg/m. General: Well developed, well nourished, in no acute distress. Head eyes ears  nose throat: Normocephalic, atraumatic, sclera non-icteric, no xanthomas, nares are without discharge. No apparent thyromegaly and/or mass  Lungs: Normal respiratory effort.Diffuse wheezes, no rales, no rhonchi.  Heart: RRR with normal S1 S2. no murmur gallop, no rub, PMI is normal size and placement, carotid upstroke normal without bruit, jugular venous pressure is normal Abdomen: Soft, non-tender, non-distended with normoactive bowel sounds. No hepatomegaly. No rebound/guarding. No obvious abdominal masses. Abdominal aorta is normal size without bruit Extremities: No edema. no cyanosis, no clubbing, no ulcers  Peripheral : 2+ bilateral upper extremity pulses, 2+ bilateral femoral pulses, 2+ bilateral dorsal pedal pulse Neuro: Alert and oriented. No facial asymmetry. No focal deficit. Moves all extremities spontaneously. Musculoskeletal: Normal muscle tone without kyphosis Psych:  Responds to questions appropriately with a normal affect.    Assessment: 70 year old male with the essential hypertension mixed hyperlipidemia cardiovascular disease diabetes with complication on appropriate medication management having dizziness and presyncope possibly secondary to sinus bradycardia currently without evidence of myocardial infarction and or heart failure  Plan: 1. Abstain from metoprolol to improve heart rate and reduce the possibility of the bradycardic-induced presyncope and syncope 2. Further consideration that clonidine can also cause bradycardia if the patient does not have improvements 3. Other medication management for hypertension control 4. Echocardiogram for LV systolic dysfunction valvular heart disease changes 5. High cholesterol therapy for cardiovascular disease 6. Begin ambulation and follow for improvements of symptoms and further assessments  Signed, Corey Skains M.D. [facility] 03/04/2016, 9:49 AM

## 2016-03-05 DIAGNOSIS — R42 Dizziness and giddiness: Secondary | ICD-10-CM | POA: Diagnosis not present

## 2016-03-05 LAB — ECHOCARDIOGRAM COMPLETE
HEIGHTINCHES: 70 in
WEIGHTICAEL: 3880 [oz_av]

## 2016-03-05 LAB — GLUCOSE, CAPILLARY
Glucose-Capillary: 112 mg/dL — ABNORMAL HIGH (ref 65–99)
Glucose-Capillary: 105 mg/dL — ABNORMAL HIGH (ref 65–99)
Glucose-Capillary: 125 mg/dL — ABNORMAL HIGH (ref 65–99)

## 2016-03-05 LAB — PSA: PSA: 10.81 ng/mL — AB (ref 0.00–4.00)

## 2016-03-05 MED ORDER — PANTOPRAZOLE SODIUM 40 MG PO TBEC: 40.0000 mg | DELAYED_RELEASE_TABLET | Freq: Every day | ORAL | 0 refills | Status: DC

## 2016-03-05 MED ORDER — MECLIZINE HCL 12.5 MG PO TABS: 12.5000 mg | ORAL_TABLET | Freq: Two times a day (BID) | ORAL | 0 refills | Status: DC | PRN

## 2016-03-05 MED ORDER — TAMSULOSIN HCL 0.4 MG PO CAPS
0.4000 mg | ORAL_CAPSULE | Freq: Every day | ORAL | 0 refills | Status: DC
Start: 2016-03-05 — End: 2016-05-29

## 2016-03-05 NOTE — Progress Notes (Signed)
Patient slept during the night. While up walking around in room and to the bathroom pt. reported dizziness, nausea and headache.PRN given for nausea and headache.Medications effective. No SOB, pain or acute distress noted. Will continue to monitor pt.   Michael Voisin, RN

## 2016-03-05 NOTE — Progress Notes (Signed)
Pt. Heart rate increased from 120's to 140's while up walking around in room and immediately returned to 80's upon returning to bed. No SOB, pain or acute distress noted. Will continue to monitor pt.

## 2016-03-05 NOTE — Progress Notes (Signed)
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 Pt still complains of HA and dizzyness

## 2016-03-05 NOTE — Discharge Summary (Signed)
Michael Mathews, 70 y.o., DOB 04-Sep-1945, MRN [id number]. 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 male with a known history of Peripheral vascular disease, diabetes, OSA on CPAP and morbid obesity who presents above complaint. Patient reports over the past 3-5 days he has had dizziness and lightheadedness he also describes Crampy lower abdominal pain. Patient came to the ER with these complaints. He was noted to have hypotension on admission. Likely the cause for his dizziness but he also has history of vertigo. Once his blood pressure normalizes dizziness did improve. In terms of his abdominal pain patient underwent a CT of the abdomen which suggested a possible mesenteric disease. He underwent a CT angiogram of the abdomen showed no significant stenosis of the abdominal vessels. His abdominal pain is improved. However the CT angiogram did show possible metastatic disease and is thoracic spine. Therefore he underwent MRI of his thoracic spine which showed findings consistent with possible prostate metastases. Patient will need outpatient follow-up with oncology for further evaluation as well as urology. He was also complaining of urinary retention therefore restarted on Flomax.            Consults  cardiology, oncology  Significant Tests:  See full reports for all details     Dg Chest 2 View  Result Date: 03/03/2016 CLINICAL DATA:  Shortness of breath with weakness,  hypertension and abdominal pain. EXAM: CHEST  2 VIEW COMPARISON:  08/16/2008 FINDINGS: Old right seventh rib fracture. Age-indeterminate fracture involving the right ninth rib. Subtle densities at the right lung base could represent atelectasis. No significant airspace disease. Heart and mediastinum are within normal limits and stable. Trachea is midline. Again noted is a sclerotic lesion in the proximal left humerus that is suggestive for an enchondroma. No significant pleural effusions. IMPRESSION: No acute chest abnormality. Presumed old right rib fractures. Electronically Signed   By: Markus Daft M.D.   On: 03/03/2016 11:09   Ct Head Wo Contrast  Result Date: 03/03/2016 CLINICAL DATA:  Headache, dizziness and neck pain this week. EXAM: CT HEAD WITHOUT CONTRAST TECHNIQUE: Contiguous axial images were obtained from the base of the skull through the vertex without intravenous contrast. COMPARISON:  Head CT 08/16/2008 FINDINGS: Brain: Stable age related cerebral atrophy, ventriculomegaly and periventricular white matter disease. No extra-axial fluid collections are identified. No CT findings for acute hemispheric infarction or intracranial hemorrhage. No mass lesions. The brainstem and cerebellum are normal. Vascular: Moderate vascular calcifications. No obvious aneurysm or worrisome hyperdense vessels. Skull: Fell skull fracture or bone lesion. Sinuses/Orbits: The paranasal sinuses and mastoid air cells are grossly clear. The globes are intact. Other: No scalp lesion or hematoma. IMPRESSION: Slightly progressive age related cerebral atrophy, ventriculomegaly and periventricular white matter disease. No acute intracranial findings or mass lesion. Electronically Signed   By: Marijo Sanes M.D.   On: 03/03/2016 12:58   Mr Lumbar Spine Wo Contrast  Result Date: 03/04/2016 CLINICAL DATA:  Sclerotic lesions throughout the imaged thoracic and lumbar spine today on CT abdomen and pelvis. Spinal stenosis on CT  scan  earlier today. EXAM: MRI LUMBAR SPINE WITHOUT CONTRAST TECHNIQUE: Multiplanar, multisequence MR imaging of the lumbar spine was performed. No intravenous contrast was administered. COMPARISON:  CT abdomen and pelvis this same day. MRI lumbar spine 10/29/2010. FINDINGS: Segmentation:  Unremarkable. Alignment:  Trace retrolisthesis L1 on L2 and L2 on L3 is noted Vertebrae: Multifocal T1 and T2 hypointense lesions with areas of increased signal on STIR sequence are seen in multiple vertebral bodies. Largest lesions are in T11, T12, L1 and in the imaged sacrum. These are most consistent with metastatic disease. Primary lesion is not identified. Conus medullaris: Extends to the L1 level and appears normal. Paraspinal and other soft tissues: See report of abdomen and pelvis CT scan this same day. Disc levels: T11-12 is imaged in the sagittal plane only and negative. T12-L1:  Negative. L1-2: Shallow disc bulge and annular fissure without central canal or foraminal stenosis. L2-3: Disc bulge and ligamentum flavum thickening cause moderate to moderately severe central canal stenosis. Epidural fat is mildly prominent. Spondylosis has progressed. The foramina are open. L3-4: Disc bulge and ligamentum flavum thickening cause moderately severe central canal stenosis which appears worse than on the prior exam. Foramina are open. L4-5: Prominent epidural fat is identified and there is a broad-based right paracentral protrusion. The thecal sac is compressed by fat and disc. Moderate bilateral foraminal narrowing is worse on the right. The appearance is unchanged. L5-S1: Epidural fat appears decreased at this level. There is a disc bulge but the central canal and foramina are open. IMPRESSION: Multifocal bone lesions as seen on prior CT scan consistent with neoplastic process such as metastatic prostate carcinoma or less likely multiple myeloma. Some progression in degenerative disease at L2-3 and L3-4 is described above.  Electronically Signed   By: Inge Rise M.D.   On: 03/04/2016 15:30   Ct Abdomen Pelvis W Contrast  Result Date: 03/03/2016 CLINICAL DATA:  Weakness, hypotension and abdominal pain. EXAM: CT ABDOMEN AND PELVIS WITH CONTRAST TECHNIQUE: Multidetector CT imaging of the abdomen and pelvis was performed using the standard protocol following bolus administration of intravenous contrast. CONTRAST:  24m ISOVUE-300 IOPAMIDOL (ISOVUE-300) INJECTION 61% COMPARISON:  06/11/2005 FINDINGS: Lower chest: Lung bases are clear except for a calcified granuloma posteriorly in the right lower lobe. No pleural or pericardial fluid. Hepatobiliary: Normal Pancreas: Normal Spleen: Normal Adrenals/Urinary Tract: Adrenal glands are normal. There is extensive renal arterial calcification. No evidence of mass, cyst or hydronephrosis. Some focal atrophy at the lower pole of left kidney. Stomach/Bowel: No intestinal abnormality seen. Vascular/Lymphatic: Advanced arterial atherosclerosis. No abdominal aneurysm. Because of the advanced atherosclerotic disease of the superior mesenteric artery, mesenteric ischemia syndrome could occur. Reproductive: Normal Other: No ascites or free air. Bilateral inguinal hernias containing fat. Musculoskeletal: Advanced chronic degenerative changes affecting the lumbar spine. IMPRESSION: No acute organ pathology. Advanced diffuse atherosclerosis including aortic atherosclerosis. Superior mesenteric artery atherosclerosis raising the question if the patient could be experiencing mesenteric ischemia syndrome. No visible bowel pathology on this scan. Bilateral inguinal hernias containing fat. Advanced lower lumbar degenerative disease. Electronically Signed   By: MNelson ChimesM.D.   On: 03/03/2016 13:00   UKoreaCarotid Bilateral  Result Date: 03/03/2016 CLINICAL DATA:  Stroke symptoms, hypertension, syncope and hyperlipidemia. History of diabetes and tobacco use. EXAM: BILATERAL CAROTID DUPLEX ULTRASOUND  TECHNIQUE: GPearline Cablesscale imaging, color Doppler and duplex ultrasound were performed of bilateral carotid and vertebral arteries in the neck. COMPARISON:  03/03/2016 head CT FINDINGS: Criteria: Quantification of carotid stenosis is  based on velocity parameters that correlate the residual internal carotid diameter with NASCET-based stenosis levels, using the diameter of the distal internal carotid lumen as the denominator for stenosis measurement. The following velocity measurements were obtained: RIGHT ICA:  169/31 cm/sec CCA:  78/58 cm/sec SYSTOLIC ICA/CCA RATIO:  1.8 DIASTOLIC ICA/CCA RATIO:  2.9 ECA:  198 cm/sec LEFT ICA:  101/39 cm/sec CCA:  850/27 cm/sec SYSTOLIC ICA/CCA RATIO:  1.0 DIASTOLIC ICA/CCA RATIO:  1.9 ECA:  94 cm/sec RIGHT CAROTID ARTERY: Mild-to-moderate heterogeneous partially calcified right carotid system atherosclerosis. Slight tortuosity of the right ICA. No significant luminal narrowing by grayscale imaging. There is slight proximal right ICA velocity elevation measure 169/31 cm/sec which appears to be secondary to mild tortuosity rather than a significant stenosis. Degree of narrowing estimated less than 50%. RIGHT VERTEBRAL ARTERY:  Antegrade LEFT CAROTID ARTERY: Similar moderate heterogeneous and partially calcified carotid atherosclerosis at the bifurcation. Despite this, no hemodynamically significant left ICA stenosis, velocity elevation, or turbulent flow. Degree of narrowing also less than 50%. LEFT VERTEBRAL ARTERY:  Antegrade IMPRESSION: Mild-to-moderate bilateral carotid atherosclerosis. Slight right ICA tortuosity. No hemodynamically significant stenosis, degree of narrowing less than 50% bilaterally. Patent antegrade vertebral flow bilaterally Electronically Signed   By: Jerilynn Mages.  Shick M.D.   On: 03/03/2016 14:34   Ct Angio Abd/pel W/ And/or W/o  Result Date: 03/04/2016 CLINICAL DATA:  Abdominal pain EXAM: CTA ABDOMEN AND PELVIS wITHOUT AND WITH CONTRAST TECHNIQUE: Multidetector CT  imaging of the abdomen and pelvis was performed using the standard protocol during bolus administration of intravenous contrast. Multiplanar reconstructed images and MIPs were obtained and reviewed to evaluate the vascular anatomy. CONTRAST:  100 cc Isovue 370 COMPARISON:  Yesterday FINDINGS: VASCULAR Aorta: Maximal diameter of the aorta at the diaphragmatic hiatus is 3.9 cm. Diffuse irregular atherosclerotic plaque is present throughout the abdominal aorta. No evidence of dissection. Atherosclerotic calcifications are also present. Celiac: Atherosclerotic calcifications at the origin. No significant narrowing. Branch vessels patent. Accessory left hepatic artery anatomy. SMA: Scattered atherosclerotic calcifications. Origin is patent. Mid SMA is patent. Atherosclerotic changes of the distal SMA are noted. Renals: Single renal arteries are patent with diffuse atherosclerotic calcifications. IMA: Moderate disease at the origin. It is there after patent with diffuse atherosclerotic changes. Inflow: Diffuse atherosclerotic plaque and calcification without significant focal narrowing in the right common, internal and external iliac arteries. Diffuse atherosclerotic calcifications of the left common, internal, and external iliac arteries. Maximal caliber of the left common iliac artery is 1.7 cm. Proximal Outflow: Atherosclerotic changes of the right proximal femoral arteries are noted. There is extensive calcification in the proximal right superficial femoral artery with suspected significant narrowing. No significant narrowing in the visualized proximal left femoral arterial system. Extensive atherosclerotic calcifications are noted. Veins: Hepatic, portal, splenic, superior mesenteric, and renal veins are patent. Review of the MIP images confirms the above findings. NON-VASCULAR Lower chest: Dependent atelectasis. Three vessel coronary artery calcification. Hepatobiliary: Calcified granulomata in the liver. Sludge in  the gallbladder. Pancreas: Unremarkable Spleen: Calcified granulomata Adrenals/Urinary Tract: Adrenal glands are unremarkable. Chronic changes of the kidneys with bilateral scarring. Tiny hypodensity in the right kidney is too small to characterize but has a benign appearance. Stomach/Bowel: Normal appendix. No obvious mass in the colon. Descending and sigmoid colon are decompressed. Diverticulosis of the descending and sigmoid colon. Vascular/Lymphatic: No abnormal retroperitoneal adenopathy. Reproductive: Bilateral inguinal hernia contains adipose tissue. No free-fluid. Other: Bladder and prostate are unremarkable. Musculoskeletal: Sclerotic changes are present throughout the lower thoracic and lumbar  vertebral bodies worrisome for sclerotic metastatic disease. No vertebral compression deformity. Advanced degenerative disc disease results in spinal stenosis in the lumbar spine. IMPRESSION: VASCULAR Upper abdominal aorta is 3.9 cm in caliber. Atherosclerotic changes of the celiac and SMA without significant focal narrowing. Moderate narrowing at the origin of the IMA. This is not likely significant given the small caliber of the vessel. 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 Significant narrowing at the origin of the right superficial femoral artery. Correlate with a history of claudication. 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 Bone scan or MRI may be helpful. Correlate with PSA and prostate examination. Electronically Signed   By: Marybelle Killings M.D.   On: 03/04/2016 13:04       Today   Subjective:   Michael Mathews feeling better abdominal pain is improved  Objective:   Blood pressure (!) 137/57, pulse 73, temperature 97.7 F (36.5 C), temperature source Oral, resp. rate 14, height 5' 10"  (1.778 m), weight 242 lb 8 oz (110 kg), SpO2 94 %.  .  Intake/Output Summary (Last 24 hours) at 03/05/16  1234 Last data filed at 03/05/16 0814  Gross per 24 hour  Intake          2741.66 ml  Output              460 ml  Net          2281.66 ml    Exam VITAL SIGNS: Blood pressure (!) 137/57, pulse 73, temperature 97.7 F (36.5 C), temperature source Oral, resp. rate 14, height 5' 10"  (1.778 m), weight 242 lb 8 oz (110 kg), SpO2 94 %.  GENERAL:  70 y.o.-year-old patient lying in the bed with no acute distress.  EYES: Pupils equal, round, reactive to light and accommodation. No scleral icterus. Extraocular muscles intact.  HEENT: Head atraumatic, normocephalic. Oropharynx and nasopharynx clear.  NECK:  Supple, no jugular venous distention. No thyroid enlargement, no tenderness.  LUNGS: Normal breath sounds bilaterally, no wheezing, rales,rhonchi or crepitation. No use of accessory muscles of respiration.  CARDIOVASCULAR: S1, S2 normal. No murmurs, rubs, or gallops.  ABDOMEN: Soft, nontender, nondistended. Bowel sounds present. No organomegaly or mass.  EXTREMITIES: No pedal edema, cyanosis, or clubbing.  NEUROLOGIC: Cranial nerves II through XII are intact. Muscle strength 5/5 in all extremities. Sensation intact. Gait not checked.  PSYCHIATRIC: The patient is alert and oriented x 3.  SKIN: No obvious rash, lesion, or ulcer.   Data Review     CBC w Diff: Lab Results  Component Value Date   WBC 7.2 03/04/2016   HGB 9.4 (L) 03/04/2016   HCT 28.2 (L) 03/04/2016   PLT 170 03/04/2016   CMP: Lab Results  Component Value Date   NA 137 03/04/2016   NA 140 11/28/2013   K 3.2 (L) 03/04/2016   K 3.7 11/28/2013   CL 106 03/04/2016   CL 107 11/28/2013   CO2 27 03/04/2016   CO2 27 11/28/2013   BUN 23 (H) 03/04/2016   BUN 16 11/28/2013   CREATININE 1.17 03/04/2016   CREATININE 1.34 (H) 11/28/2013   PROT 7.0 03/03/2016   ALBUMIN 3.4 (L) 03/03/2016   BILITOT 0.7 03/03/2016   ALKPHOS 241 (H) 03/03/2016   AST 15 03/03/2016   ALT 10 (L) 03/03/2016  .  Micro Results No results found for  this or any previous visit (from the past 240 hour(s)).  Code Status Orders        Start     Ordered   03/03/16 1537  Do not attempt resuscitation (DNR)  Continuous    Question Answer Comment  In the event of cardiac or respiratory ARREST Do not call a "code blue"   In the event of cardiac or respiratory ARREST Do not perform Intubation, CPR, defibrillation or ACLS   In the event of cardiac or respiratory ARREST Use medication by any route, position, wound care, and other measures to relive pain and suffering. May use oxygen, suction and manual treatment of airway obstruction as needed for comfort.      03/03/16 1536    Code Status History    Date Active Date Inactive Code Status Order ID Comments User Context   03/03/2016  3:36 PM 03/04/2016  7:06 AM DNR [id number]  Bettey Costa, MD Inpatient    Questions for Most Recent Historical Code Status (Order [id number])    Question Answer Comment   In the event of cardiac or respiratory ARREST Do not call a "code blue"    In the event of cardiac or respiratory ARREST Do not perform Intubation, CPR, defibrillation or ACLS    In the event of cardiac or respiratory ARREST Use medication by any route, position, wound care, and other measures to relive pain and suffering. May use oxygen, suction and manual treatment of airway obstruction as needed for comfort.           Follow-up Information    Hollice Espy, MD In 1 week.   Specialty:  Urology Why:  prostate cancer,bph- PLEASE CALL THE OFFICE AND SCHEDULE THIS APPOITNMENT  Contact information: [place] [address] [phone]        Cammie Sickle, MD In 7 days.   Specialties:  Internal Medicine, Oncology Why:  prostate cancer-PLEASE CALL THE OFFICE AND SCHEUDLE THIS APPOINTMENT Contact information: [place] [address] [place], NGWE A, MD In 2 weeks.   Specialty:  Family Medicine Why:  PLEASE  CALL THE OFFICE AND SCHEDULE THIS APPOINTMENT  Contact information: [place] [address] [phone]           Discharge Medications     Medication List    STOP taking these medications   glipiZIDE 10 MG 24 hr tablet Commonly known as:  GLUCOTROL XL   hydrochlorothiazide 25 MG tablet Commonly known as:  HYDRODIURIL     TAKE these medications   aspirin EC 325 MG tablet Take 325 mg by mouth every morning.   atorvastatin 10 MG tablet Commonly known as:  LIPITOR Take 10 mg by mouth daily.   cloNIDine 0.3 MG tablet Commonly known as:  CATAPRES Take 0.3 mg by mouth daily.   DULoxetine 30 MG capsule Commonly known as:  CYMBALTA Take 30 mg by mouth daily.   Fluticasone-Salmeterol 250-50 MCG/DOSE Aepb Commonly known as:  ADVAIR Inhale 1 puff into the lungs 2 (two) times daily.   hydrALAZINE 100 MG tablet Commonly known as:  APRESOLINE Take 100 mg by mouth 3 (three) times daily.   losartan 100 MG tablet Commonly known as:  COZAAR Take 100 mg by mouth daily.   meclizine 12.5 MG tablet Commonly known as:  ANTIVERT Take 1 tablet (12.5 mg total) by mouth 2 (two) times daily as needed for dizziness.   metFORMIN 500 MG tablet Commonly known as:  GLUCOPHAGE Take 500  mg by mouth 2 (two) times daily with a meal.   metoprolol 50 MG tablet Commonly known as:  LOPRESSOR Take 50 mg by mouth 2 (two) times daily.   pantoprazole 40 MG tablet Commonly known as:  PROTONIX Take 1 tablet (40 mg total) by mouth daily.   tamsulosin 0.4 MG Caps capsule Commonly known as:  FLOMAX Take 1 capsule (0.4 mg total) by mouth daily.   terazosin 10 MG capsule Commonly known as:  HYTRIN Take 10 mg by mouth at bedtime.          Total Time in preparing paper work, data evaluation and todays exam - 35 minutes  Dustin Flock M.D on 03/05/2016 at 12:34 PM  [facility]   Office  [phone]

## 2016-03-05 NOTE — Progress Notes (Signed)
Still voiding freq small amounts stands at bedside to void. He is alert and chatty. Ambulates to the bathroom with steady gait.

## 2016-03-05 NOTE — Progress Notes (Signed)
Pt taken off tele IV removed DC instructions given to patient with prescriptions. Pt was taken downstairs to lobby to wait for cab to bring him home.  He is aware of CA diagnosis and given follow up info for appts

## 2016-03-05 NOTE — Progress Notes (Signed)
Pt. Refuses bed alarm, risk and benefits reviewed with pt. He still refused bed alarm. Will continue to monitor pt.

## 2016-03-05 NOTE — Care Management Note (Addendum)
Case Management Note  Patient Details  Name: Michael Mathews MRN: [id number] Date of Birth: 01/17/1946  Subjective/Objective:       Discharge home today with no home health orders. To follow-up outpatient with Dr Rogue Bussing at the [facility] per metastasis to the spine of prostate cancer.  Has CPAP machine at home. Lives alone.            Action/Plan:   Expected Discharge Date:                  Expected Discharge Plan:     In-House Referral:     Discharge planning Services     Post Acute Care Choice:    Choice offered to:     DME Arranged:    DME Agency:     HH Arranged:    HH Agency:     Status of Service:     If discussed at [employer] of Stay Meetings, dates discussed:    Additional Comments:  Navy Belay A, RN 03/05/2016, 9:50 AM

## 2016-03-05 NOTE — Clinical Social Work Note (Signed)
CSW visited at bedside to assess for home needs and safety. Patient denied needs and was able to verbalize his safety plan at home and multiple neighbors and community supports. CSW signing off.  Santiago Bumpers, MSW, LCSW-A [phone]

## 2016-03-05 NOTE — Progress Notes (Signed)
[facility] Encounter Note  Patient: Michael Mathews / Admit Date: 03/03/2016 / Date of Encounter: 03/05/2016, 6:38 AM   Subjective: Patient woke up with headache and palpitations as well as some dizziness as he was before. No evidence of significant bradycardia after adjustment of medication management and discontinuation of metoprolol. No evidence of chest pain or myocardial infarction. Patient uses CPAP machine last night  Review of Systems: Positive for: Headache and shortness of breath and dizziness Negative for: Vision change, hearing change, syncope,  nausea, vomiting,diarrhea, bloody stool, stomach pain, cough, congestion, diaphoresis, urinary frequency, urinary pain,skin lesions, skin rashes Others previously listed  Objective: Telemetry: Normal sinus rhythm Physical Exam: Blood pressure 121/68, pulse 83, temperature 98 F (36.7 C), temperature source Oral, resp. rate 15, height 5' 10"  (1.778 m), weight 242 lb 8 oz (110 kg), SpO2 94 %. Body mass index is 34.8 kg/m. General: Well developed, well nourished, in no acute distress. Head: Normocephalic, atraumatic, sclera non-icteric, no xanthomas, nares are without discharge. Neck: No apparent masses Lungs: Normal respirations with no wheezes, no rhonchi, no rales , no crackles   Heart: Regular rate and rhythm, normal S1 S2, no murmur, no rub, no gallop, PMI is normal size and placement, carotid upstroke normal without bruit, jugular venous pressure normal Abdomen: Soft, non-tender, non-distended with normoactive bowel sounds. No hepatosplenomegaly. Abdominal aorta is normal size without bruit Extremities: No edema, no clubbing, no cyanosis, no ulcers,  Peripheral: 2+ radial, 2+ femoral, 2+ dorsal pedal pulses Neuro: Alert and oriented. Moves all extremities spontaneously. Psych:  Responds to questions appropriately with a normal affect.   Intake/Output Summary (Last 24 hours) at 03/05/16 0638 Last data filed  at 03/05/16 0227  Gross per 24 hour  Intake          2248.33 ml  Output              890 ml  Net          1358.33 ml    Inpatient Medications:  . aspirin EC  325 mg Oral BH-q7a  . atorvastatin  10 mg Oral Daily  . cloNIDine  0.3 mg Oral Daily  . DULoxetine  30 mg Oral Daily  . enoxaparin (LOVENOX) injection  40 mg Subcutaneous Q24H  . hydrALAZINE  100 mg Oral TID  . insulin aspart  0-20 Units Subcutaneous TID WC  . insulin aspart  0-5 Units Subcutaneous QHS  . losartan  100 mg Oral Daily  . mometasone-formoterol  2 puff Inhalation BID  . pantoprazole  40 mg Oral Daily  . sodium chloride flush  3 mL Intravenous Q12H  . tamsulosin  0.4 mg Oral Daily  . terazosin  10 mg Oral QHS   Infusions:  . sodium chloride 100 mL/hr at 03/05/16 0200    Labs:  Recent Labs  03/03/16 1032 03/04/16 0057  NA 137 137  K 3.5 3.2*  CL 105 106  CO2 25 27  GLUCOSE 120* 65  BUN 28* 23*  CREATININE 1.62* 1.17  CALCIUM 8.4* 8.2*    Recent Labs  03/03/16 1032  AST 15  ALT 10*  ALKPHOS 241*  BILITOT 0.7  PROT 7.0  ALBUMIN 3.4*    Recent Labs  03/03/16 1032 03/04/16 0057  WBC 6.4 7.2  HGB 9.8* 9.4*  HCT 29.3* 28.2*  MCV 92.8 92.4  PLT 185 170    Recent Labs  03/03/16 1032 03/03/16 1620 03/03/16 1912 03/04/16 0057  TROPONINI <0.03 <0.03 <0.03 <0.03  Invalid input(s): POCBNP No results for input(s): HGBA1C in the last 72 hours.   Weights: Filed Weights   03/03/16 1016 03/03/16 1522  Weight: 245 lb (111.1 kg) 242 lb 8 oz (110 kg)     Radiology/Studies:  Dg Chest 2 View  Result Date: 03/03/2016 CLINICAL DATA:  Shortness of breath with weakness, hypertension and abdominal pain. EXAM: CHEST  2 VIEW COMPARISON:  08/16/2008 FINDINGS: Old right seventh rib fracture. Age-indeterminate fracture involving the right ninth rib. Subtle densities at the right lung base could represent atelectasis. No significant airspace disease. Heart and mediastinum are within normal limits  and stable. Trachea is midline. Again noted is a sclerotic lesion in the proximal left humerus that is suggestive for an enchondroma. No significant pleural effusions. IMPRESSION: No acute chest abnormality. Presumed old right rib fractures. Electronically Signed   By: Markus Daft M.D.   On: 03/03/2016 11:09   Ct Head Wo Contrast  Result Date: 03/03/2016 CLINICAL DATA:  Headache, dizziness and neck pain this week. EXAM: CT HEAD WITHOUT CONTRAST TECHNIQUE: Contiguous axial images were obtained from the base of the skull through the vertex without intravenous contrast. COMPARISON:  Head CT 08/16/2008 FINDINGS: Brain: Stable age related cerebral atrophy, ventriculomegaly and periventricular white matter disease. No extra-axial fluid collections are identified. No CT findings for acute hemispheric infarction or intracranial hemorrhage. No mass lesions. The brainstem and cerebellum are normal. Vascular: Moderate vascular calcifications. No obvious aneurysm or worrisome hyperdense vessels. Skull: Fell skull fracture or bone lesion. Sinuses/Orbits: The paranasal sinuses and mastoid air cells are grossly clear. The globes are intact. Other: No scalp lesion or hematoma. IMPRESSION: Slightly progressive age related cerebral atrophy, ventriculomegaly and periventricular white matter disease. No acute intracranial findings or mass lesion. Electronically Signed   By: Marijo Sanes M.D.   On: 03/03/2016 12:58   Mr Lumbar Spine Wo Contrast  Result Date: 03/04/2016 CLINICAL DATA:  Sclerotic lesions throughout the imaged thoracic and lumbar spine today on CT abdomen and pelvis. Spinal stenosis on CT scan earlier today. EXAM: MRI LUMBAR SPINE WITHOUT CONTRAST TECHNIQUE: Multiplanar, multisequence MR imaging of the lumbar spine was performed. No intravenous contrast was administered. COMPARISON:  CT abdomen and pelvis this same day. MRI lumbar spine 10/29/2010. FINDINGS: Segmentation:  Unremarkable. Alignment:  Trace  retrolisthesis L1 on L2 and L2 on L3 is noted Vertebrae: Multifocal T1 and T2 hypointense lesions with areas of increased signal on STIR sequence are seen in multiple vertebral bodies. Largest lesions are in T11, T12, L1 and in the imaged sacrum. These are most consistent with metastatic disease. Primary lesion is not identified. Conus medullaris: Extends to the L1 level and appears normal. Paraspinal and other soft tissues: See report of abdomen and pelvis CT scan this same day. Disc levels: T11-12 is imaged in the sagittal plane only and negative. T12-L1:  Negative. L1-2: Shallow disc bulge and annular fissure without central canal or foraminal stenosis. L2-3: Disc bulge and ligamentum flavum thickening cause moderate to moderately severe central canal stenosis. Epidural fat is mildly prominent. Spondylosis has progressed. The foramina are open. L3-4: Disc bulge and ligamentum flavum thickening cause moderately severe central canal stenosis which appears worse than on the prior exam. Foramina are open. L4-5: Prominent epidural fat is identified and there is a broad-based right paracentral protrusion. The thecal sac is compressed by fat and disc. Moderate bilateral foraminal narrowing is worse on the right. The appearance is unchanged. L5-S1: Epidural fat appears decreased at this level. There is a  disc bulge but the central canal and foramina are open. IMPRESSION: Multifocal bone lesions as seen on prior CT scan consistent with neoplastic process such as metastatic prostate carcinoma or less likely multiple myeloma. Some progression in degenerative disease at L2-3 and L3-4 is described above. Electronically Signed   By: Inge Rise M.D.   On: 03/04/2016 15:30   Ct Abdomen Pelvis W Contrast  Result Date: 03/03/2016 CLINICAL DATA:  Weakness, hypotension and abdominal pain. EXAM: CT ABDOMEN AND PELVIS WITH CONTRAST TECHNIQUE: Multidetector CT imaging of the abdomen and pelvis was performed using the standard  protocol following bolus administration of intravenous contrast. CONTRAST:  35m ISOVUE-300 IOPAMIDOL (ISOVUE-300) INJECTION 61% COMPARISON:  06/11/2005 FINDINGS: Lower chest: Lung bases are clear except for a calcified granuloma posteriorly in the right lower lobe. No pleural or pericardial fluid. Hepatobiliary: Normal Pancreas: Normal Spleen: Normal Adrenals/Urinary Tract: Adrenal glands are normal. There is extensive renal arterial calcification. No evidence of mass, cyst or hydronephrosis. Some focal atrophy at the lower pole of left kidney. Stomach/Bowel: No intestinal abnormality seen. Vascular/Lymphatic: Advanced arterial atherosclerosis. No abdominal aneurysm. Because of the advanced atherosclerotic disease of the superior mesenteric artery, mesenteric ischemia syndrome could occur. Reproductive: Normal Other: No ascites or free air. Bilateral inguinal hernias containing fat. Musculoskeletal: Advanced chronic degenerative changes affecting the lumbar spine. IMPRESSION: No acute organ pathology. Advanced diffuse atherosclerosis including aortic atherosclerosis. Superior mesenteric artery atherosclerosis raising the question if the patient could be experiencing mesenteric ischemia syndrome. No visible bowel pathology on this scan. Bilateral inguinal hernias containing fat. Advanced lower lumbar degenerative disease. Electronically Signed   By: MNelson ChimesM.D.   On: 03/03/2016 13:00   UKoreaCarotid Bilateral  Result Date: 03/03/2016 CLINICAL DATA:  Stroke symptoms, hypertension, syncope and hyperlipidemia. History of diabetes and tobacco use. EXAM: BILATERAL CAROTID DUPLEX ULTRASOUND TECHNIQUE: GPearline Cablesscale imaging, color Doppler and duplex ultrasound were performed of bilateral carotid and vertebral arteries in the neck. COMPARISON:  03/03/2016 head CT FINDINGS: Criteria: Quantification of carotid stenosis is based on velocity parameters that correlate the residual internal carotid diameter with NASCET-based  stenosis levels, using the diameter of the distal internal carotid lumen as the denominator for stenosis measurement. The following velocity measurements were obtained: RIGHT ICA:  169/31 cm/sec CCA:  941/32cm/sec SYSTOLIC ICA/CCA RATIO:  1.8 DIASTOLIC ICA/CCA RATIO:  2.9 ECA:  198 cm/sec LEFT ICA:  101/39 cm/sec CCA:  1440/10cm/sec SYSTOLIC ICA/CCA RATIO:  1.0 DIASTOLIC ICA/CCA RATIO:  1.9 ECA:  94 cm/sec RIGHT CAROTID ARTERY: Mild-to-moderate heterogeneous partially calcified right carotid system atherosclerosis. Slight tortuosity of the right ICA. No significant luminal narrowing by grayscale imaging. There is slight proximal right ICA velocity elevation measure 169/31 cm/sec which appears to be secondary to mild tortuosity rather than a significant stenosis. Degree of narrowing estimated less than 50%. RIGHT VERTEBRAL ARTERY:  Antegrade LEFT CAROTID ARTERY: Similar moderate heterogeneous and partially calcified carotid atherosclerosis at the bifurcation. Despite this, no hemodynamically significant left ICA stenosis, velocity elevation, or turbulent flow. Degree of narrowing also less than 50%. LEFT VERTEBRAL ARTERY:  Antegrade IMPRESSION: Mild-to-moderate bilateral carotid atherosclerosis. Slight right ICA tortuosity. No hemodynamically significant stenosis, degree of narrowing less than 50% bilaterally. Patent antegrade vertebral flow bilaterally Electronically Signed   By: MJerilynn Mages  Shick M.D.   On: 03/03/2016 14:34   Ct Angio Abd/pel W/ And/or W/o  Result Date: 03/04/2016 CLINICAL DATA:  Abdominal pain EXAM: CTA ABDOMEN AND PELVIS wITHOUT AND WITH CONTRAST TECHNIQUE: Multidetector CT imaging of the abdomen  and pelvis was performed using the standard protocol during bolus administration of intravenous contrast. Multiplanar reconstructed images and MIPs were obtained and reviewed to evaluate the vascular anatomy. CONTRAST:  100 cc Isovue 370 COMPARISON:  Yesterday FINDINGS: VASCULAR Aorta: Maximal diameter of  the aorta at the diaphragmatic hiatus is 3.9 cm. Diffuse irregular atherosclerotic plaque is present throughout the abdominal aorta. No evidence of dissection. Atherosclerotic calcifications are also present. Celiac: Atherosclerotic calcifications at the origin. No significant narrowing. Branch vessels patent. Accessory left hepatic artery anatomy. SMA: Scattered atherosclerotic calcifications. Origin is patent. Mid SMA is patent. Atherosclerotic changes of the distal SMA are noted. Renals: Single renal arteries are patent with diffuse atherosclerotic calcifications. IMA: Moderate disease at the origin. It is there after patent with diffuse atherosclerotic changes. Inflow: Diffuse atherosclerotic plaque and calcification without significant focal narrowing in the right common, internal and external iliac arteries. Diffuse atherosclerotic calcifications of the left common, internal, and external iliac arteries. Maximal caliber of the left common iliac artery is 1.7 cm. Proximal Outflow: Atherosclerotic changes of the right proximal femoral arteries are noted. There is extensive calcification in the proximal right superficial femoral artery with suspected significant narrowing. No significant narrowing in the visualized proximal left femoral arterial system. Extensive atherosclerotic calcifications are noted. Veins: Hepatic, portal, splenic, superior mesenteric, and renal veins are patent. Review of the MIP images confirms the above findings. NON-VASCULAR Lower chest: Dependent atelectasis. Three vessel coronary artery calcification. Hepatobiliary: Calcified granulomata in the liver. Sludge in the gallbladder. Pancreas: Unremarkable Spleen: Calcified granulomata Adrenals/Urinary Tract: Adrenal glands are unremarkable. Chronic changes of the kidneys with bilateral scarring. Tiny hypodensity in the right kidney is too small to characterize but has a benign appearance. Stomach/Bowel: Normal appendix. No obvious mass in  the colon. Descending and sigmoid colon are decompressed. Diverticulosis of the descending and sigmoid colon. Vascular/Lymphatic: No abnormal retroperitoneal adenopathy. Reproductive: Bilateral inguinal hernia contains adipose tissue. No free-fluid. Other: Bladder and prostate are unremarkable. Musculoskeletal: Sclerotic changes are present throughout the lower thoracic and lumbar vertebral bodies worrisome for sclerotic metastatic disease. No vertebral compression deformity. Advanced degenerative disc disease results in spinal stenosis in the lumbar spine. IMPRESSION: VASCULAR Upper abdominal aorta is 3.9 cm in caliber. Atherosclerotic changes of the celiac and SMA without significant focal narrowing. Moderate narrowing at the origin of the IMA. This is not likely significant given the small caliber of the vessel. 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 Significant narrowing at the origin of the right superficial femoral artery. Correlate with a history of claudication. 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 Bone scan or MRI may be helpful. Correlate with PSA and prostate examination. Electronically Signed   By: Marybelle Killings M.D.   On: 03/04/2016 13:04     Assessment and Recommendation  70 y.o. male with the essential hypertension makes hyperlipidemia diabetes with complication sleep apnea with stress test and echocardiogram within 6 months ago both being normal and no current evidence of myocardial infarction having dizziness headache and other constitutional symptoms of unknown etiology 1. No further cardiac workup or diagnostics necessary at this time 2. Continue to abstain from beta blocker due to concerns of bradycardia increasing risk of syncope presyncope 3. Further investigation of other medications which may cause side effects including hydralazine and/or clonidine but no changes at this time  until further ambulation 4. Continue CPAP machine diligent use to improve possible above symptoms of headache and dizziness  5. High intensity cholesterol therapy with atorvastatin for further risk reduction of peripheral vascular disease 6. Okay for discharge home from cardiac standpoint if ambulating well without further significant symptoms and/or changes by telemetry for further outpatient workup  Signed, Serafina Royals M.D. FACC

## 2016-03-05 NOTE — Discharge Instructions (Addendum)

## 2016-03-07 ENCOUNTER — Encounter: Admission: RE | Payer: Self-pay | Source: Ambulatory Visit

## 2016-03-07 ENCOUNTER — Ambulatory Visit: Admission: RE | Admit: 2016-03-07 | Payer: Medicare Other | Source: Ambulatory Visit | Admitting: Ophthalmology

## 2016-03-07 HISTORY — DX: Unspecified osteoarthritis, unspecified site: M19.90

## 2016-03-07 HISTORY — DX: Type 2 diabetes mellitus without complications: E11.9

## 2016-03-07 HISTORY — DX: Orthopnea: R06.01

## 2016-03-07 HISTORY — DX: Reserved for inherently not codable concepts without codable children: IMO0001

## 2016-03-07 HISTORY — DX: Sleep apnea, unspecified: G47.30

## 2016-03-07 HISTORY — DX: Edema, unspecified: R60.9

## 2016-03-07 HISTORY — DX: Cough, unspecified: R05.9

## 2016-03-07 HISTORY — DX: Hypoxemia: R09.02

## 2016-03-07 HISTORY — DX: Essential (primary) hypertension: I10

## 2016-03-07 HISTORY — DX: Cough: R05

## 2016-03-07 HISTORY — DX: Chronic obstructive pulmonary disease, unspecified: J44.9

## 2016-03-07 SURGERY — PHACOEMULSIFICATION, CATARACT, WITH IOL INSERTION
Anesthesia: Choice | Laterality: Left

## 2016-03-11 ENCOUNTER — Inpatient Hospital Stay
Admission: EM | Admit: 2016-03-11 | Discharge: 2016-03-14 | DRG: 683 | Disposition: A | Discharge status: Home / Self Care | Payer: Medicare Other | Attending: Internal Medicine | Admitting: Internal Medicine

## 2016-03-11 ENCOUNTER — Inpatient Hospital Stay: Payer: Medicare Other

## 2016-03-11 ENCOUNTER — Encounter: Payer: Self-pay | Admitting: *Deleted

## 2016-03-11 ENCOUNTER — Emergency Department: Payer: Medicare Other

## 2016-03-11 DIAGNOSIS — G4733 Obstructive sleep apnea (adult) (pediatric): Secondary | ICD-10-CM | POA: Diagnosis present

## 2016-03-11 DIAGNOSIS — R001 Bradycardia, unspecified: Secondary | ICD-10-CM | POA: Diagnosis present

## 2016-03-11 DIAGNOSIS — I739 Peripheral vascular disease, unspecified: Secondary | ICD-10-CM | POA: Diagnosis present

## 2016-03-11 DIAGNOSIS — Z9181 History of falling: Secondary | ICD-10-CM | POA: Diagnosis not present

## 2016-03-11 DIAGNOSIS — M6281 Muscle weakness (generalized): Secondary | ICD-10-CM

## 2016-03-11 DIAGNOSIS — R51 Headache: Secondary | ICD-10-CM

## 2016-03-11 DIAGNOSIS — I959 Hypotension, unspecified: Secondary | ICD-10-CM | POA: Diagnosis present

## 2016-03-11 DIAGNOSIS — Z66 Do not resuscitate: Secondary | ICD-10-CM | POA: Diagnosis present

## 2016-03-11 DIAGNOSIS — R2681 Unsteadiness on feet: Secondary | ICD-10-CM

## 2016-03-11 DIAGNOSIS — C61 Malignant neoplasm of prostate: Secondary | ICD-10-CM | POA: Diagnosis present

## 2016-03-11 DIAGNOSIS — D649 Anemia, unspecified: Secondary | ICD-10-CM | POA: Diagnosis present

## 2016-03-11 DIAGNOSIS — Z9981 Dependence on supplemental oxygen: Secondary | ICD-10-CM | POA: Diagnosis not present

## 2016-03-11 DIAGNOSIS — N141 Nephropathy induced by other drugs, medicaments and biological substances: Secondary | ICD-10-CM | POA: Diagnosis present

## 2016-03-11 DIAGNOSIS — J449 Chronic obstructive pulmonary disease, unspecified: Secondary | ICD-10-CM | POA: Diagnosis present

## 2016-03-11 DIAGNOSIS — Z23 Encounter for immunization: Secondary | ICD-10-CM | POA: Diagnosis not present

## 2016-03-11 DIAGNOSIS — E1122 Type 2 diabetes mellitus with diabetic chronic kidney disease: Secondary | ICD-10-CM | POA: Diagnosis present

## 2016-03-11 DIAGNOSIS — Z6835 Body mass index (BMI) 35.0-35.9, adult: Secondary | ICD-10-CM

## 2016-03-11 DIAGNOSIS — Z7984 Long term (current) use of oral hypoglycemic drugs: Secondary | ICD-10-CM | POA: Diagnosis not present

## 2016-03-11 DIAGNOSIS — C7951 Secondary malignant neoplasm of bone: Secondary | ICD-10-CM | POA: Diagnosis present

## 2016-03-11 DIAGNOSIS — I129 Hypertensive chronic kidney disease with stage 1 through stage 4 chronic kidney disease, or unspecified chronic kidney disease: Secondary | ICD-10-CM | POA: Diagnosis present

## 2016-03-11 DIAGNOSIS — F17200 Nicotine dependence, unspecified, uncomplicated: Secondary | ICD-10-CM | POA: Diagnosis present

## 2016-03-11 DIAGNOSIS — R519 Headache, unspecified: Secondary | ICD-10-CM

## 2016-03-11 DIAGNOSIS — N400 Enlarged prostate without lower urinary tract symptoms: Secondary | ICD-10-CM | POA: Diagnosis present

## 2016-03-11 DIAGNOSIS — M1990 Unspecified osteoarthritis, unspecified site: Secondary | ICD-10-CM | POA: Diagnosis present

## 2016-03-11 DIAGNOSIS — R42 Dizziness and giddiness: Secondary | ICD-10-CM | POA: Diagnosis present

## 2016-03-11 DIAGNOSIS — N184 Chronic kidney disease, stage 4 (severe): Secondary | ICD-10-CM | POA: Diagnosis present

## 2016-03-11 DIAGNOSIS — Z7982 Long term (current) use of aspirin: Secondary | ICD-10-CM | POA: Diagnosis not present

## 2016-03-11 DIAGNOSIS — E860 Dehydration: Secondary | ICD-10-CM | POA: Diagnosis present

## 2016-03-11 DIAGNOSIS — N179 Acute kidney failure, unspecified: Secondary | ICD-10-CM | POA: Diagnosis present

## 2016-03-11 DIAGNOSIS — T383X5A Adverse effect of insulin and oral hypoglycemic [antidiabetic] drugs, initial encounter: Secondary | ICD-10-CM | POA: Diagnosis present

## 2016-03-11 DIAGNOSIS — M549 Dorsalgia, unspecified: Secondary | ICD-10-CM | POA: Diagnosis present

## 2016-03-11 LAB — COMPREHENSIVE METABOLIC PANEL
ALT: 12 U/L — AB (ref 17–63)
AST: 19 U/L (ref 15–41)
Albumin: 3.5 g/dL (ref 3.5–5.0)
Alkaline Phosphatase: 245 U/L — ABNORMAL HIGH (ref 38–126)
Anion gap: 10 (ref 5–15)
BILIRUBIN TOTAL: 1.1 mg/dL (ref 0.3–1.2)
BUN: 30 mg/dL — AB (ref 6–20)
CO2: 23 mmol/L (ref 22–32)
CREATININE: 3.48 mg/dL — AB (ref 0.61–1.24)
Calcium: 8.3 mg/dL — ABNORMAL LOW (ref 8.9–10.3)
Chloride: 104 mmol/L (ref 101–111)
GFR calc Af Amer: 19 mL/min — ABNORMAL LOW (ref >60)
GFR, EST NON AFRICAN AMERICAN: 16 mL/min — AB (ref >60)
Glucose, Bld: 129 mg/dL — ABNORMAL HIGH (ref 65–99)
Potassium: 4.1 mmol/L (ref 3.5–5.1)
Sodium: 137 mmol/L (ref 135–145)
TOTAL PROTEIN: 7 g/dL (ref 6.5–8.1)

## 2016-03-11 LAB — CBC WITH DIFFERENTIAL/PLATELET
BASOS ABS: 0 10*3/uL (ref 0–0.1)
Basophils Relative: 1 %
Eosinophils Absolute: 0 10*3/uL (ref 0–0.7)
Eosinophils Relative: 0 %
HEMATOCRIT: 32 % — AB (ref 40.0–52.0)
Hemoglobin: 10.6 g/dL — ABNORMAL LOW (ref 13.0–18.0)
LYMPHS PCT: 7 %
Lymphs Abs: 0.5 10*3/uL — ABNORMAL LOW (ref 1.0–3.6)
MCH: 30.9 pg (ref 26.0–34.0)
MCHC: 33.2 g/dL (ref 32.0–36.0)
MCV: 93.2 fL (ref 80.0–100.0)
MONO ABS: 0.9 10*3/uL (ref 0.2–1.0)
Monocytes Relative: 12 %
NEUTROS ABS: 6.4 10*3/uL (ref 1.4–6.5)
Neutrophils Relative %: 80 %
Platelets: 199 10*3/uL (ref 150–440)
RBC: 3.43 MIL/uL — AB (ref 4.40–5.90)
RDW: 15.8 % — ABNORMAL HIGH (ref 11.5–14.5)
WBC: 7.9 10*3/uL (ref 3.8–10.6)

## 2016-03-11 LAB — BLOOD GAS, VENOUS
Acid-Base Excess: 0 mmol/L (ref 0.0–2.0)
BICARBONATE: 24.8 mmol/L (ref 20.0–28.0)
FIO2: 0.21
O2 Saturation: 74.9 %
PATIENT TEMPERATURE: 37
PH VEN: 7.4 (ref 7.250–7.430)
PO2 VEN: 40 mmHg (ref 32.0–45.0)
pCO2, Ven: 40 mmHg — ABNORMAL LOW (ref 44.0–60.0)

## 2016-03-11 LAB — GLUCOSE, CAPILLARY
GLUCOSE-CAPILLARY: 112 mg/dL — AB (ref 65–99)
GLUCOSE-CAPILLARY: 109 mg/dL — AB (ref 65–99)

## 2016-03-11 LAB — TROPONIN I

## 2016-03-11 MED ORDER — PNEUMOCOCCAL VAC POLYVALENT 25 MCG/0.5ML IJ INJ
0.5000 mL | INJECTION | INTRAMUSCULAR | Status: AC
  Administered 2016-03-13: 0.5 mL via INTRAMUSCULAR
  Filled 2016-03-11: qty 0.5

## 2016-03-11 MED ORDER — PANTOPRAZOLE SODIUM 40 MG PO TBEC
40.0000 mg | DELAYED_RELEASE_TABLET | Freq: Every day | ORAL | Status: DC
  Administered 2016-03-11 – 2016-03-14 (×4): 40 mg via ORAL
  Filled 2016-03-11 (×4): qty 1

## 2016-03-11 MED ORDER — HYDRALAZINE HCL 50 MG PO TABS
100.0000 mg | ORAL_TABLET | Freq: Three times a day (TID) | ORAL | Status: DC
  Administered 2016-03-11 (×2): 100 mg via ORAL
  Filled 2016-03-11 (×2): qty 2

## 2016-03-11 MED ORDER — INSULIN ASPART 100 UNIT/ML ~~LOC~~ SOLN
0.0000 [IU] | Freq: Three times a day (TID) | SUBCUTANEOUS | Status: DC
  Administered 2016-03-12 – 2016-03-14 (×3): 3 [IU] via SUBCUTANEOUS
  Filled 2016-03-11 (×3): qty 3

## 2016-03-11 MED ORDER — ACETAMINOPHEN 650 MG RE SUPP: 650.0000 mg | Freq: Four times a day (QID) | RECTAL | Status: DC | PRN

## 2016-03-11 MED ORDER — ONDANSETRON HCL 4 MG/2ML IJ SOLN: 4.0000 mg | Freq: Four times a day (QID) | INTRAMUSCULAR | Status: DC | PRN

## 2016-03-11 MED ORDER — SODIUM CHLORIDE 0.9% FLUSH
3.0000 mL | Freq: Two times a day (BID) | INTRAVENOUS | Status: DC
  Administered 2016-03-11 – 2016-03-13 (×2): 3 mL via INTRAVENOUS

## 2016-03-11 MED ORDER — ACETAMINOPHEN 325 MG PO TABS
650.0000 mg | ORAL_TABLET | Freq: Four times a day (QID) | ORAL | Status: DC | PRN
  Administered 2016-03-11 – 2016-03-14 (×5): 650 mg via ORAL
  Filled 2016-03-11 (×5): qty 2

## 2016-03-11 MED ORDER — TERAZOSIN HCL 5 MG PO CAPS
10.0000 mg | ORAL_CAPSULE | Freq: Every day | ORAL | Status: DC
  Administered 2016-03-11 – 2016-03-12 (×2): 10 mg via ORAL
  Filled 2016-03-11 (×3): qty 2

## 2016-03-11 MED ORDER — ONDANSETRON HCL 4 MG PO TABS: 4.0000 mg | ORAL_TABLET | Freq: Four times a day (QID) | ORAL | Status: DC | PRN

## 2016-03-11 MED ORDER — SODIUM CHLORIDE 0.9 % IV SOLN
INTRAVENOUS | Status: DC
  Administered 2016-03-11 – 2016-03-14 (×8): via INTRAVENOUS

## 2016-03-11 MED ORDER — DULOXETINE HCL 30 MG PO CPEP
30.0000 mg | ORAL_CAPSULE | Freq: Every day | ORAL | Status: DC
  Administered 2016-03-12 – 2016-03-14 (×3): 30 mg via ORAL
  Filled 2016-03-11 (×3): qty 1

## 2016-03-11 MED ORDER — OXYCODONE HCL 5 MG PO TABS: 5.0000 mg | ORAL_TABLET | ORAL | Status: DC | PRN

## 2016-03-11 MED ORDER — TAMSULOSIN HCL 0.4 MG PO CAPS
0.4000 mg | ORAL_CAPSULE | Freq: Every day | ORAL | Status: DC
  Administered 2016-03-11 – 2016-03-14 (×4): 0.4 mg via ORAL
  Filled 2016-03-11 (×4): qty 1

## 2016-03-11 MED ORDER — ASPIRIN EC 325 MG PO TBEC
325.0000 mg | DELAYED_RELEASE_TABLET | Freq: Every morning | ORAL | Status: DC
  Administered 2016-03-12 – 2016-03-14 (×3): 325 mg via ORAL
  Filled 2016-03-11 (×3): qty 1

## 2016-03-11 MED ORDER — MECLIZINE HCL 12.5 MG PO TABS
12.5000 mg | ORAL_TABLET | Freq: Two times a day (BID) | ORAL | Status: DC | PRN
  Administered 2016-03-14: 12.5 mg via ORAL
  Filled 2016-03-11: qty 1

## 2016-03-11 MED ORDER — MECLIZINE HCL 25 MG PO TABS
25.0000 mg | ORAL_TABLET | Freq: Once | ORAL | Status: DC
  Filled 2016-03-11: qty 1

## 2016-03-11 MED ORDER — SODIUM CHLORIDE 0.9 % IV BOLUS (SEPSIS)
500.0000 mL | Freq: Once | INTRAVENOUS | Status: AC
  Administered 2016-03-11: 500 mL via INTRAVENOUS

## 2016-03-11 MED ORDER — SENNOSIDES-DOCUSATE SODIUM 8.6-50 MG PO TABS: 1.0000 | ORAL_TABLET | Freq: Every evening | ORAL | Status: DC | PRN

## 2016-03-11 MED ORDER — HEPARIN SODIUM (PORCINE) 5000 UNIT/ML IJ SOLN
5000.0000 [IU] | Freq: Three times a day (TID) | INTRAMUSCULAR | Status: DC
  Administered 2016-03-11 – 2016-03-14 (×9): 5000 [IU] via SUBCUTANEOUS
  Filled 2016-03-11 (×9): qty 1

## 2016-03-11 MED ORDER — IPRATROPIUM-ALBUTEROL 0.5-2.5 (3) MG/3ML IN SOLN
3.0000 mL | Freq: Once | RESPIRATORY_TRACT | Status: AC
  Administered 2016-03-11: 3 mL via RESPIRATORY_TRACT
  Filled 2016-03-11: qty 3

## 2016-03-11 MED ORDER — ATORVASTATIN CALCIUM 10 MG PO TABS
10.0000 mg | ORAL_TABLET | Freq: Every day | ORAL | Status: DC
  Administered 2016-03-11 – 2016-03-13 (×3): 10 mg via ORAL
  Filled 2016-03-11 (×3): qty 1

## 2016-03-11 MED ORDER — INFLUENZA VAC SPLIT QUAD 0.5 ML IM SUSY
0.5000 mL | PREFILLED_SYRINGE | INTRAMUSCULAR | Status: AC
  Administered 2016-03-13: 0.5 mL via INTRAMUSCULAR
  Filled 2016-03-11: qty 0.5

## 2016-03-11 MED ORDER — MOMETASONE FURO-FORMOTEROL FUM 200-5 MCG/ACT IN AERO
2.0000 | INHALATION_SPRAY | Freq: Two times a day (BID) | RESPIRATORY_TRACT | Status: DC
  Administered 2016-03-11 – 2016-03-13 (×5): 2 via RESPIRATORY_TRACT
  Filled 2016-03-11: qty 8.8

## 2016-03-11 NOTE — H&P (Signed)
[facility] [place] NAME: Michael Mathews    MR#:  [id number]  DATE OF BIRTH:  19-Oct-1945  DATE OF ADMISSION:  03/11/2016  PRIMARY CARE PHYSICIAN: [name], MD   REQUESTING/REFERRING PHYSICIAN: Dr Edd Fabian  CHIEF COMPLAINT:   dizziness HISTORY OF PRESENT ILLNESS:  Michael Mathews  is a 70 y.o. male with a known history of Peripheral vascular disease, diabetes, OSA on CPAP and morbid obesity who presents above complaint. He was admitted to the hospital about a week ago with similar complaints and discharged on Sunday. At that time he underwent carotid Doppler which showed no evidence of hemodynamically significant stenosis, echocardiogram which showed no major valvular abnormalities and normal ejection fraction and head CT which was unremarkable. Due to abdominal pain during last hospital stay he underwent CTA which did not show evidence of mesenteric disease however showed possible metastatic disease. He underwent MRI of the thoracic spine which showed finding consistent with probable prostate metastatic disease. He was scheduled for follow-up with urology however canceled due to transportation issues. He presents today with dizziness. He states that when he walks he feels like he is in a merry-go-round. He denies chest pain, tinnitus, sinus infection, fever or chills. He has also been feeling nauseous. He denies any visual or neurological symptoms. In the emergency room he was noted to have a heart rate in the high 40s to mid 50s. His blood pressure was initially low however has responded well to IV fluids. Glipizide and HCTZ were discontinued during last hospital stay.  EKG shows sinus bradycardia with a heart rate of 49 and nonspecific intraventricular conduction delay.  PAST MEDICAL HISTORY:   Past Medical History:  Diagnosis Date  . Arthritis   . Asthma   . COPD (chronic obstructive pulmonary disease) ([place])   . Cough    chronic  . Diabetes  mellitus without complication ([place])   . Edema   . Hypertension   . Orthopnea   . Oxygen deficit    o2 prn  . Shortness of breath dyspnea   . Sleep apnea    cpap    PAST SURGICAL HISTORY:   Past Surgical History:  Procedure Laterality Date  . CARDIAC CATHETERIZATION    . COLONOSCOPY    . EYE SURGERY    . KNEE ARTHROSCOPY      SOCIAL HISTORY:   Social History  Substance Use Topics  . Smoking status: Current Every Day Smoker  . Smokeless tobacco: Current User  . Alcohol use No    FAMILY HISTORY:  CVA and CAD  DRUG ALLERGIES:  No Known Allergies  REVIEW OF SYSTEMS:   Review of Systems  Constitutional: Negative for chills, fever and malaise/fatigue.       Dizziness  HENT: Negative.  Negative for ear discharge, ear pain, hearing loss, nosebleeds and sore throat.   Eyes: Negative.  Negative for blurred vision and pain.  Respiratory: Negative.  Negative for cough, hemoptysis, shortness of breath and wheezing.   Cardiovascular: Negative.  Negative for chest pain, palpitations and leg swelling.  Gastrointestinal: Negative.  Negative for abdominal pain, blood in stool, diarrhea, nausea and vomiting.  Genitourinary: Negative.  Negative for dysuria.  Musculoskeletal: Positive for back pain and falls.  Skin: Negative.   Neurological: Positive for weakness. Negative for dizziness, tremors, speech change, focal weakness, seizures and headaches.  Endo/Heme/Allergies: Negative.  Does not bruise/bleed easily.  Psychiatric/Behavioral: Negative.  Negative for depression, hallucinations and suicidal ideas.  MEDICATIONS AT HOME:   Prior to Admission medications   Medication Sig Start Date End Date Taking? Authorizing Provider  aspirin EC 325 MG tablet Take 325 mg by mouth every morning.   Yes Historical Provider, MD  atorvastatin (LIPITOR) 10 MG tablet Take 10 mg by mouth daily.   Yes Historical Provider, MD  cloNIDine (CATAPRES) 0.3 MG tablet Take 0.3 mg by mouth daily.   Yes  Historical Provider, MD  DULoxetine (CYMBALTA) 30 MG capsule Take 30 mg by mouth daily. 01/04/16  Yes Historical Provider, MD  Fluticasone-Salmeterol (ADVAIR) 250-50 MCG/DOSE AEPB Inhale 1 puff into the lungs 2 (two) times daily.   Yes Historical Provider, MD  GLIPIZIDE XL 10 MG 24 hr tablet Take 20 mg by mouth daily. 01/19/16  Yes Historical Provider, MD  hydrALAZINE (APRESOLINE) 100 MG tablet Take 100 mg by mouth 3 (three) times daily.   Yes Historical Provider, MD  losartan (COZAAR) 100 MG tablet Take 100 mg by mouth daily.   Yes Historical Provider, MD  meclizine (ANTIVERT) 12.5 MG tablet Take 1 tablet (12.5 mg total) by mouth 2 (two) times daily as needed for dizziness. 03/05/16  Yes Dustin Flock, MD  metFORMIN (GLUCOPHAGE) 500 MG tablet Take 500 mg by mouth 2 (two) times daily with a meal.   Yes Historical Provider, MD  metoprolol (LOPRESSOR) 50 MG tablet Take 50 mg by mouth 2 (two) times daily.   Yes Historical Provider, MD  pantoprazole (PROTONIX) 40 MG tablet Take 1 tablet (40 mg total) by mouth daily. 03/05/16  Yes Dustin Flock, MD  tamsulosin (FLOMAX) 0.4 MG CAPS capsule Take 1 capsule (0.4 mg total) by mouth daily. 03/05/16  Yes Dustin Flock, MD  terazosin (HYTRIN) 10 MG capsule Take 10 mg by mouth at bedtime.    Yes Historical Provider, MD      VITAL SIGNS:  Blood pressure (!) 148/63, pulse (!) 48, temperature 97.3 F (36.3 C), temperature source Oral, resp. rate 12, height 5\' 10"  (1.778 m), weight 111.1 kg (245 lb), SpO2 100 %.  PHYSICAL EXAMINATION:   Physical Exam  Constitutional: He is oriented to person, place, and time and well-developed, well-nourished, and in no distress. No distress.  Obese  HENT:  Head: Normocephalic.  Eyes: No scleral icterus.  Neck: Normal range of motion. Neck supple. No JVD present. No tracheal deviation present.  Cardiovascular: Normal rate, regular rhythm and normal heart sounds.  Exam reveals no gallop and no friction rub.   No murmur  heard. Pulmonary/Chest: Effort normal and breath sounds normal. No respiratory distress. He has no wheezes. He has no rales. He exhibits no tenderness.  Abdominal: Soft. Bowel sounds are normal. He exhibits no distension and no mass. There is no tenderness. There is no rebound and no guarding.  Musculoskeletal: Normal range of motion. He exhibits no edema.  Neurological: He is alert and oriented to person, place, and time.  Skin: Skin is warm. No rash noted. No erythema.  Psychiatric: Affect and judgment normal.      LABORATORY PANEL:   CBC  Recent Labs Lab 03/11/16 1201  WBC 7.9  HGB 10.6*  HCT 32.0*  PLT 199   ------------------------------------------------------------------------------------------------------------------  Chemistries   Recent Labs Lab 03/11/16 1201  NA 137  K 4.1  CL 104  CO2 23  GLUCOSE 129*  BUN 30*  CREATININE 3.48*  CALCIUM 8.3*  AST 19  ALT 12*  ALKPHOS 245*  BILITOT 1.1   ------------------------------------------------------------------------------------------------------------------  Cardiac Enzymes  Recent Labs Lab 03/11/16  [place] <0.03   ------------------------------------------------------------------------------------------------------------------  RADIOLOGY:  Dg Chest 2 View  Result Date: 03/11/2016 CLINICAL DATA:  Pt was discharged on Sunday after being admitted for a few days; pt states that he hasn't felt himself for the past week and states feeling worse; pt states this am when he got up he was very dizzy. Hx/o COPD and is a smoker. EXAM: CHEST  2 VIEW COMPARISON:  03/03/2016 FINDINGS: There is linear right middle lobe opacity that is likely due to atelectasis. Remainder of the lungs is clear. No pleural effusion. No pneumothorax. Cardiac silhouette is normal in size. No mediastinal or hilar masses. No evidence of adenopathy. Skeletal structures are demineralized. IMPRESSION: No acute cardiopulmonary disease.  Electronically Signed   By: Lajean Manes M.D.   On: 03/11/2016 12:31    EKG:  Sinus bradycardia heart rate of 49 with nonspecific interventricular delay. No ST elevation  IMPRESSION AND PLAN:   70 year old obese male with a history of peripheral vascular disease and hypertension who presents again with dizziness after being in the hospital last week for same issue and underwent echocardiogram and carotid Doppler which were unremarkable.  1. Dizziness: I will order MRI to evaluate CNS cause. Orthostatic vital signs. Hold clonidine as side effect is dizziness. Telemetry for bradycardia as etiology of dizziness. When necessary meclizine for dizziness  2. Bradycardia, symptomatic with dizziness: Hold metoprolol for now.  3. Acute renal failure: This is most likely due to contrast-induced nephropathy and use of metformin. Start IV fluids. Renal ultrasound ordered Nephrology consult requested. Hold nephrotoxic agents including losartan and metformin. Repeat BMP in a.m. Monitor intake and output.  4. Essential hypertension: Monitor blood pressure while discontinuing clonidine and metoprolol as well as losartan. Continue hydralazine. May need to add Norvasc.  5. Tobacco dependence: Patient is highly encouraged to stop smoking. He was counseled for 3 minutes regarding stopping smoking. He is in a contemplative state.   6. Diabetes: Stop metformin due to problem #3 High-dose sliding scale insulin. Monitor blood sugars  7. Peripheral vascular disease: Continue aspirin and atorvastatin.  8. 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 Hollice Espy at discharge. Continue Flomax.     All the records are reviewed and case discussed with ED provider. Management plans discussed with the patient and he in agreement  CODE STATUS: DNR  TOTAL TIME TAKING CARE OF THIS PATIENT: 50 minutes.    Jaice Lague M.D on 03/11/2016 at  1:54 PM  Between 7am to 6pm - Pager - [phone]  After 6pm go to [email or web address] - password [email or web address]  Office  [phone]  CC: Primary care physician; [name], MD

## 2016-03-11 NOTE — ED Notes (Signed)
Patient transported to Ultrasound 

## 2016-03-11 NOTE — ED Notes (Signed)
Pt aware of need for urine specimen. 

## 2016-03-11 NOTE — ED Notes (Signed)
Mri just called to ask for the patient to stop by before going to the floor, floor called to make aware

## 2016-03-11 NOTE — ED Provider Notes (Signed)
[facility] Emergency Department Provider Note    First MD Initiated Contact with Patient 03/11/16 1159     (approximate)  I have reviewed the triage vital signs and the nursing notes.   HISTORY  Chief Complaint No chief complaint on file.    HPI Michael Mathews is a 70 y.o. male with history of COPD as well as chronic vertigo presents for evaluation of recurrent postural dizziness. Patient admitted to the hospital on the 22nd of this month for similar symptoms. Had extensive evaluation for CVA which was unremarkable. Symptoms seem to remain hemodynamically improved after IV hydration as the patient came in with low blood pressures. Patient on multiple medications to control his hypertension. Also with history of prostate enlargement as recently started on finasteride. Patient describes dizziness when standing up or changing positions. Denies any numbness or tingling. No nausea or vomiting. No chest pain or shortness of breath. Denies any fevers. No visual disturbance.   Past Medical History:  Diagnosis Date  . Arthritis   . Asthma   . COPD (chronic obstructive pulmonary disease) ([place])   . Cough    chronic  . Diabetes mellitus without complication ([place])   . Edema   . Hypertension   . Orthopnea   . Oxygen deficit    o2 prn  . Shortness of breath dyspnea   . Sleep apnea    cpap    Patient Active Problem List   Diagnosis Date Noted  . Dizziness 03/03/2016    Past Surgical History:  Procedure Laterality Date  . CARDIAC CATHETERIZATION    . COLONOSCOPY    . EYE SURGERY    . KNEE ARTHROSCOPY      Prior to Admission medications   Medication Sig Start Date End Date Taking? Authorizing Provider  aspirin EC 325 MG tablet Take 325 mg by mouth every morning.    Historical Provider, MD  atorvastatin (LIPITOR) 10 MG tablet Take 10 mg by mouth daily.    Historical Provider, MD  cloNIDine (CATAPRES) 0.3 MG tablet Take 0.3 mg by mouth daily.     Historical Provider, MD  DULoxetine (CYMBALTA) 30 MG capsule Take 30 mg by mouth daily. 01/04/16   Historical Provider, MD  Fluticasone-Salmeterol (ADVAIR) 250-50 MCG/DOSE AEPB Inhale 1 puff into the lungs 2 (two) times daily.    Historical Provider, MD  hydrALAZINE (APRESOLINE) 100 MG tablet Take 100 mg by mouth 3 (three) times daily.    Historical Provider, MD  losartan (COZAAR) 100 MG tablet Take 100 mg by mouth daily.    Historical Provider, MD  meclizine (ANTIVERT) 12.5 MG tablet Take 1 tablet (12.5 mg total) by mouth 2 (two) times daily as needed for dizziness. 03/05/16   Dustin Flock, MD  metFORMIN (GLUCOPHAGE) 500 MG tablet Take 500 mg by mouth 2 (two) times daily with a meal.    Historical Provider, MD  metoprolol (LOPRESSOR) 50 MG tablet Take 50 mg by mouth 2 (two) times daily.    Historical Provider, MD  pantoprazole (PROTONIX) 40 MG tablet Take 1 tablet (40 mg total) by mouth daily. 03/05/16   Dustin Flock, MD  tamsulosin (FLOMAX) 0.4 MG CAPS capsule Take 1 capsule (0.4 mg total) by mouth daily. 03/05/16   Dustin Flock, MD  terazosin (HYTRIN) 10 MG capsule Take 10 mg by mouth at bedtime.     Historical Provider, MD    Allergies Review of patient's allergies indicates no known allergies.  No family history on file.  Social  History Social History  Substance Use Topics  . Smoking status: Current Every Day Smoker  . Smokeless tobacco: Current User  . Alcohol use No    Review of Systems Patient denies headaches, rhinorrhea, blurry vision, numbness, shortness of breath, chest pain, edema, cough, abdominal pain, nausea, vomiting, diarrhea, dysuria, fevers, rashes or hallucinations unless otherwise stated above in HPI. ____________________________________________   PHYSICAL EXAM:  VITAL SIGNS: There were no vitals filed for this visit.  Constitutional: Alert and oriented. Well appearing and in no acute distress. Eyes: Conjunctivae are normal. PERRL. EOMI. Head:  Atraumatic. Nose: No congestion/rhinnorhea. Mouth/Throat: Mucous membranes are moist.  Oropharynx non-erythematous. Neck: No stridor. Painless ROM. No cervical spine tenderness to palpation Hematological/Lymphatic/Immunilogical: No cervical lymphadenopathy. Cardiovascular: Normal rate, regular rhythm. Grossly normal heart sounds.  Good peripheral circulation. Respiratory: Normal respiratory effort.  No retractions.Prolonged story face Lungs Coarse bibasilar breath sounds Gastrointestinal: Soft and nontender. No distention. No abdominal bruits. No CVA tenderness.  Musculoskeletal: No lower extremity tenderness nor edema.  No joint effusions. Neurologic:  CN- intact.  No facial droop, Normal FNF.  Normal heel to shin.  Sensation intact bilaterally. Normal speech and language. No gross focal neurologic deficits are appreciated. No gait instability.  Skin:  Skin is warm, dry and intact. No rash noted. Psychiatric: Mood and affect are normal. Speech and behavior are normal.  ____________________________________________   LABS (all labs ordered are listed, but only abnormal results are displayed)  Results for orders placed or performed during the hospital encounter of 03/11/16 (from the past 24 hour(s))  CBC with Differential/Platelet     Status: Abnormal   Collection Time: 03/11/16 12:01 PM  Result Value Ref Range   WBC 7.9 3.8 - 10.6 K/uL   RBC 3.43 (L) 4.40 - 5.90 MIL/uL   Hemoglobin 10.6 (L) 13.0 - 18.0 g/dL   HCT 32.0 (L) 40.0 - 52.0 %   MCV 93.2 80.0 - 100.0 fL   MCH 30.9 26.0 - 34.0 pg   MCHC 33.2 32.0 - 36.0 g/dL   RDW 15.8 (H) 11.5 - 14.5 %   Platelets 199 150 - 440 K/uL   Neutrophils Relative % 80 %   Neutro Abs 6.4 1.4 - 6.5 K/uL   Lymphocytes Relative 7 %   Lymphs Abs 0.5 (L) 1.0 - 3.6 K/uL   Monocytes Relative 12 %   Monocytes Absolute 0.9 0.2 - 1.0 K/uL   Eosinophils Relative 0 %   Eosinophils Absolute 0.0 0 - 0.7 K/uL   Basophils Relative 1 %   Basophils Absolute  0.0 0 - 0.1 K/uL  Comprehensive metabolic panel     Status: Abnormal   Collection Time: 03/11/16 12:01 PM  Result Value Ref Range   Sodium 137 135 - 145 mmol/L   Potassium 4.1 3.5 - 5.1 mmol/L   Chloride 104 101 - 111 mmol/L   CO2 23 22 - 32 mmol/L   Glucose, Bld 129 (H) 65 - 99 mg/dL   BUN 30 (H) 6 - 20 mg/dL   Creatinine, Ser 3.48 (H) 0.61 - 1.24 mg/dL   Calcium 8.3 (L) 8.9 - 10.3 mg/dL   Total Protein 7.0 6.5 - 8.1 g/dL   Albumin 3.5 3.5 - 5.0 g/dL   AST 19 15 - 41 U/L   ALT 12 (L) 17 - 63 U/L   Alkaline Phosphatase 245 (H) 38 - 126 U/L   Total Bilirubin 1.1 0.3 - 1.2 mg/dL   GFR calc non Af Amer 16 (L) >60 mL/min  GFR calc Af Amer 19 (L) >60 mL/min   Anion gap 10 5 - 15  Troponin I     Status: None   Collection Time: 03/11/16 12:01 PM  Result Value Ref Range   Troponin I <0.03 <0.03 ng/mL  Blood gas, venous     Status: Abnormal   Collection Time: 03/11/16 12:10 PM  Result Value Ref Range   FIO2 0.21    pH, Ven 7.40 7.250 - 7.430   pCO2, Ven 40 (L) 44.0 - 60.0 mmHg   pO2, Ven 40.0 32.0 - 45.0 mmHg   Bicarbonate 24.8 20.0 - 28.0 mmol/L   Acid-Base Excess 0.0 0.0 - 2.0 mmol/L   O2 Saturation 74.9 %   Patient temperature 37.0    Collection site VEIN    Sample type VENOUS   Glucose, capillary     Status: Abnormal   Collection Time: 03/11/16  4:34 PM  Result Value Ref Range   Glucose-Capillary 112 (H) 65 - 99 mg/dL   Comment 1 Notify RN    ____________________________________________  EKG My review and personal interpretation at Time: 12:03   Indication: dizziness  Rate: 50  Rhythm: normal sinus Axis: normal Other: nonspecific st changes ____________________________________________  RADIOLOGY  I personally reviewed all radiographic images ordered to evaluate for the above acute complaints and reviewed radiology reports and findings.  These findings were personally discussed with the patient.  Please see medical record for radiology  report.  ____________________________________________   PROCEDURES  Procedure(s) performed: none    Critical Care performed: no ____________________________________________   INITIAL IMPRESSION / ASSESSMENT AND PLAN / ED COURSE  Pertinent labs & imaging results that were available during my care of the patient were reviewed by me and considered in my medical decision making (see chart for details).  [id number], CVA, vertigo, symptematic bradycardia, Orthostasis  Michael Mathews is a 70 y.o. who presents to the ED with chief complaint of persistent dizziness. He has no focal neurologic deficits. Patient arrives bradycardic and hypotensive blood pressures improved with IV fluids. Laboratory evaluation ordered due to concern for above complaints shows evidence of significant acute kidney injury without acidosis. Most likely secondary to multiple medication interactions and recent contrast load during recent hospitalization. Also probable component of dehydration. Dizziness seems less consistent with central cause based on durations and very positional components. More likely secondary to hypotension and bradycardia. Do not feel emergent CT imaging indicated at this time. Based on his AK I do feel patient will require admission for further evaluation and management due to concern for worsening of his renal function.  Have discussed with the patient and available family all diagnostics and treatments performed thus far and all questions were answered to the best of my ability. The patient demonstrates understanding and agreement with plan.   Clinical Course     ____________________________________________   FINAL CLINICAL IMPRESSION(S) / ED DIAGNOSES  Final diagnoses:  AKI (acute kidney injury) ([place])  Dizziness      NEW MEDICATIONS STARTED DURING THIS VISIT:  New Prescriptions   No medications on file     Note:  This document was prepared using Dragon voice recognition  software and may include unintentional dictation errors.    Merlyn Lot, MD 03/11/16 [phone]

## 2016-03-11 NOTE — ED Triage Notes (Signed)
Pt was discharged on Sunday after being admitted for a few days, pt states that he hasn't felt himself for the past week and states feeling worse, states this am when he got up he was very dizzy, like everything was spinning, pt states that he thinks they started him on different medications when he was admitted but is unsure. Pt's heart rate is in the upper 40's and bp of 97/63

## 2016-03-12 LAB — CBC
HEMATOCRIT: 29.8 % — AB (ref 40.0–52.0)
HEMOGLOBIN: 9.6 g/dL — AB (ref 13.0–18.0)
MCH: 30.7 pg (ref 26.0–34.0)
MCHC: 32.3 g/dL (ref 32.0–36.0)
MCV: 95 fL (ref 80.0–100.0)
PLATELETS: 161 10*3/uL (ref 150–440)
RBC: 3.13 MIL/uL — AB (ref 4.40–5.90)
RDW: 16.1 % — ABNORMAL HIGH (ref 11.5–14.5)
WBC: 4.8 10*3/uL (ref 3.8–10.6)

## 2016-03-12 LAB — URINALYSIS COMPLETE WITH MICROSCOPIC (ARMC ONLY)
Bacteria, UA: NONE SEEN
Bilirubin Urine: NEGATIVE
GLUCOSE, UA: NEGATIVE mg/dL
Hgb urine dipstick: NEGATIVE
Leukocytes, UA: NEGATIVE
Nitrite: NEGATIVE
PROTEIN: NEGATIVE mg/dL
SPECIFIC GRAVITY, URINE: 1.009 (ref 1.005–1.030)
SQUAMOUS EPITHELIAL / LPF: NONE SEEN
pH: 5 (ref 5.0–8.0)

## 2016-03-12 LAB — BASIC METABOLIC PANEL
ANION GAP: 12 (ref 5–15)
BUN: 34 mg/dL — ABNORMAL HIGH (ref 6–20)
CHLORIDE: 108 mmol/L (ref 101–111)
CO2: 20 mmol/L — ABNORMAL LOW (ref 22–32)
CREATININE: 4.14 mg/dL — AB (ref 0.61–1.24)
Calcium: 7.8 mg/dL — ABNORMAL LOW (ref 8.9–10.3)
GFR calc non Af Amer: 13 mL/min — ABNORMAL LOW (ref >60)
GFR, EST AFRICAN AMERICAN: 15 mL/min — AB (ref >60)
Glucose, Bld: 101 mg/dL — ABNORMAL HIGH (ref 65–99)
POTASSIUM: 3.5 mmol/L (ref 3.5–5.1)
SODIUM: 140 mmol/L (ref 135–145)

## 2016-03-12 LAB — GLUCOSE, CAPILLARY
GLUCOSE-CAPILLARY: 116 mg/dL — AB (ref 65–99)
GLUCOSE-CAPILLARY: 114 mg/dL — AB (ref 65–99)
Glucose-Capillary: 125 mg/dL — ABNORMAL HIGH (ref 65–99)
Glucose-Capillary: 122 mg/dL — ABNORMAL HIGH (ref 65–99)

## 2016-03-12 LAB — NA AND K (SODIUM & POTASSIUM), RAND UR
Potassium Urine: 19 mmol/L
SODIUM UR: 51 mmol/L

## 2016-03-12 NOTE — Progress Notes (Signed)
[facility] NAME: Michael Mathews    MR#:  [id number]  DATE OF BIRTH:  05-08-1946  SUBJECTIVE:   Came with dizzy spell and few falls at home  REVIEW OF SYSTEMS:   Review of Systems  Constitutional: Negative for chills, fever and weight loss.  HENT: Negative for ear discharge, ear pain and nosebleeds.   Eyes: Negative for blurred vision, pain and discharge.  Respiratory: Negative for sputum production, shortness of breath, wheezing and stridor.   Cardiovascular: Negative for chest pain, palpitations, orthopnea and PND.  Gastrointestinal: Negative for abdominal pain, diarrhea, nausea and vomiting.  Genitourinary: Negative for frequency and urgency.  Musculoskeletal: Positive for back pain and falls. Negative for joint pain.  Neurological: Positive for dizziness and weakness. Negative for sensory change, speech change and focal weakness.  Psychiatric/Behavioral: Negative for depression and hallucinations. The patient is not nervous/anxious.    Tolerating Diet:yes Tolerating PT: pending  DRUG ALLERGIES:  No Known Allergies  VITALS:  Blood pressure (!) 142/58, pulse 70, temperature 97.8 F (36.6 C), temperature source Oral, resp. rate 18, height 5\' 10"  (1.778 m), weight 111.1 kg (245 lb), SpO2 92 %.  PHYSICAL EXAMINATION:   Physical Exam  GENERAL:  70 y.o.-year-old patient lying in the bed with no acute distress.  EYES: Pupils equal, round, reactive to light and accommodation. No scleral icterus. Extraocular muscles intact.  HEENT: Head atraumatic, normocephalic. Oropharynx and nasopharynx clear.  NECK:  Supple, no jugular venous distention. No thyroid enlargement, no tenderness.  LUNGS: Normal breath sounds bilaterally, no wheezing, rales, rhonchi. No use of accessory muscles of respiration.  CARDIOVASCULAR: S1, S2 normal. No murmurs, rubs, or gallops.  ABDOMEN: Soft, nontender, nondistended. Bowel sounds present. No  organomegaly or mass.  EXTREMITIES: No cyanosis, clubbing or edema b/l.    NEUROLOGIC: Cranial nerves II through XII are intact. No focal Motor or sensory deficits b/l.   PSYCHIATRIC:  patient is alert and oriented x 3.  SKIN: No obvious rash, lesion, or ulcer.   LABORATORY PANEL:  CBC  Recent Labs Lab 03/12/16 0509  WBC 4.8  HGB 9.6*  HCT 29.8*  PLT 161    Chemistries   Recent Labs Lab 03/11/16 1201 03/12/16 0509  NA 137 140  K 4.1 3.5  CL 104 108  CO2 23 20*  GLUCOSE 129* 101*  BUN 30* 34*  CREATININE 3.48* 4.14*  CALCIUM 8.3* 7.8*  AST 19  --   ALT 12*  --   ALKPHOS 245*  --   BILITOT 1.1  --    Cardiac Enzymes  Recent Labs Lab 03/11/16 1201  TROPONINI <0.03   RADIOLOGY:  Dg Chest 2 View  Result Date: 03/11/2016 CLINICAL DATA:  Pt was discharged on Sunday after being admitted for a few days; pt states that he hasn't felt himself for the past week and states feeling worse; pt states this am when he got up he was very dizzy. Hx/o COPD and is a smoker. EXAM: CHEST  2 VIEW COMPARISON:  03/03/2016 FINDINGS: There is linear right middle lobe opacity that is likely due to atelectasis. Remainder of the lungs is clear. No pleural effusion. No pneumothorax. Cardiac silhouette is normal in size. No mediastinal or hilar masses. No evidence of adenopathy. Skeletal structures are demineralized. IMPRESSION: No acute cardiopulmonary disease. Electronically Signed   By: Lajean Manes M.D.   On: 03/11/2016 12:31   Mr Brain Wo Contrast  Result Date: 03/11/2016 CLINICAL DATA:  70 year old male with dizziness. Recent headache. Initial encounter. EXAM: MRI HEAD WITHOUT CONTRAST TECHNIQUE: Multiplanar, multiecho pulse sequences of the brain and surrounding structures were obtained without intravenous contrast. COMPARISON:  Head CT without contrast 03/03/2016 and earlier. FINDINGS: Brain: Chronic lacunar infarcts scattered in the bilateral deep gray matter nuclei. Comparatively mild  patchy superimposed bilateral cerebral white matter T2 and FLAIR hyperintensity. Moderate patchy T2 hyperintensity in the pons. There does appear to be a small area of cortical encephalomalacia in the right middle frontal gyrus on series 8, image 21. No other cortical encephalomalacia. No definite chronic cerebral blood products. No restricted diffusion to suggest acute infarction. No midline shift, mass effect, evidence of mass lesion, ventriculomegaly, extra-axial collection or acute intracranial hemorrhage. Cervicomedullary junction and pituitary are within normal limits. Vascular: Major intracranial vascular flow voids are preserved, dominant appearing distal left vertebral artery. Skull and upper cervical spine: Negative aside from chronic disc and endplate degeneration. Mildly heterogeneous bone marrow signal diffusely. Sinuses/Orbits: Postoperative changes to the right globe. Otherwise negative orbit soft tissues. Scattered mild paranasal sinus mucosal thickening. Negative scalp soft tissues. Other: Grossly normal visualized internal auditory structures. There are minimal mastoid effusions greater on the left. Negative nasopharynx. IMPRESSION: 1.  No acute intracranial abnormality. 2. Moderately advanced deep gray matter nuclei chronic small vessel ischemia. Signal changes in the pons also likely due to chronic small vessel disease. 3. Mild paranasal sinus inflammation. Minimal mastoid effusions, most often are postinflammatory and significance is doubtful. Electronically Signed   By: Genevie Ann M.D.   On: 03/11/2016 15:49   US Renal  Result Date: 03/11/2016 CLINICAL DATA:  Acute renal insufficiency EXAM: RENAL / URINARY TRACT ULTRASOUND COMPLETE COMPARISON:  None. FINDINGS: Right Kidney: Length: 11.7 cm. Echogenicity within normal limits. No mass or hydronephrosis visualized. Left Kidney: Length: 11.5 cm. Echogenicity within normal limits. No mass or hydronephrosis visualized. Bladder: There is bladder wall  thickening measuring 9 mm. IMPRESSION: 1. No hydronephrosis or renal abnormality to explain the patient's renal insufficiency. 2. Bladder wall thickening, a nonspecific finding. Electronically Signed   By: Dorise Bullion III M.D   On: 03/11/2016 14:33   ASSESSMENT AND PLAN:   70 year old obese male with a history of peripheral vascular disease and hypertension who presents again with dizziness after being in the hospital last week for same issue and underwent echocardiogram and carotid Doppler which were unremarkable.  1. Dizziness: -MRI  Negative Orthostatic vital signs normal. Hold clonidine as side effect is dizziness. Telemetry for bradycardia as etiology of dizziness. When necessary meclizine for dizziness  2. Bradycardia, symptomatic with dizziness: Hold metoprolol for now.  3. Acute renal failure: This is most likely due to contrast-induced nephropathy and use of metformin. Cont IV fluids. Renal ultrasound ordered Nephrology consult requested. Hold nephrotoxic agents including losartan and metformin. Repeat BMP in a.m. Monitor intake and output.  4. Essential hypertension: Monitor blood pressure while discontinuing clonidine and metoprolol as well as losartan. Continue hydralazine. May need to add Norvasc.  5. Tobacco dependence: Patient is highly encouraged to stop smoking. He was counseled for 3 minutes regarding stopping smoking. He is in a contemplative state.  6. Diabetes: Stop metformin due to problem #3 High-dose sliding scale insulin. Monitor blood sugars  7. Peripheral vascular disease: Continue aspirin and atorvastatin.  8. 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 Hollice Espy at discharge. Continue Flomax.   PT to see pt CSW and CM for d/c planning  Case  discussed with Care Management/Social Worker. Management plans discussed with the patient, family and they are in  agreement.  CODE STATUS:DNR  DVT Prophylaxis: Heparin TOTAL TIME TAKING CARE OF THIS PATIENT: 30 minutes.  >50% time spent on counselling and coordination of care  POSSIBLE D/C IN 1-2 DAYS, DEPENDING ON CLINICAL CONDITION.  Note: This dictation was prepared with Dragon dictation along with smaller phrase technology. Any transcriptional errors that result from this process are unintentional.  Chenita Ruda M.D on 03/12/2016 at 3:11 PM  Between 7am to 6pm - Pager - [phone]  After 6pm go to [email or web address] - password [email or web address] [facility]  Office  [phone]  CC: Primary care physician; [name], MD

## 2016-03-12 NOTE — Care Management Important Message (Signed)
Important Message  Patient Details  Name: Michael Mathews MRN: [id number] Date of Birth: April 16, 1946   Medicare Important Message Given:  Yes    Virna Livengood A, RN 03/12/2016, 4:02 PM

## 2016-03-12 NOTE — Consult Note (Signed)
[facility] CONSULT NOTE    Date: 03/12/2016                  Patient Name:  Michael Mathews  MRN: [id number]  DOB: 01/02/1946  Age / Sex: 70 y.o., male         PCP: [name], MD                 Service Requesting Consult: Hospitalisit                 Reason for Consult: Acute renal failure            History of Present Illness: Patient is a 70 y.o. male with a PMHx of Osteoarthritis, asthma, COPD, diabetes mellitus type 2, hypertension, obstructive sleep apnea, who was admitted to [facility] on 03/11/2016 for evaluation of dizziness.  Patient was recently widowed [facility] from 03/03/2016 to 03/05/2016. At that point in time he presented with dizziness. 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 However he has not had outpatient urology evaluation thus far. He also presented with acute renal failure which improved with conservative management at the last visit. On 03/04/2016 he had a CT angiogram with contrast administration. Patient presented again now with dizziness. He's had a few episodes of diarrhea but nothing excessive her report. He reports nausea but no vomiting. He states that he was attempting to keep up with his fluid intake.  The patient's baseline creatinine is 1.1 from 03/04/2016. When he re-presented this time creatinine was 3.48 but is now up to 4.1.  In addition the patient was on losartan as well as metformin at home.   Medications: Outpatient medications: Prescriptions Prior to Admission  Medication Sig Dispense Refill Last Dose  . aspirin EC 325 MG tablet Take 325 mg by mouth every morning.   03/11/2016 at Unknown time  . atorvastatin (LIPITOR) 10 MG tablet Take 10 mg by mouth daily.   unknown  . cloNIDine (CATAPRES) 0.3 MG tablet Take 0.3 mg by mouth daily.   unknown  . DULoxetine (CYMBALTA) 30 MG capsule Take 30 mg by mouth daily.   unknown   . Fluticasone-Salmeterol (ADVAIR) 250-50 MCG/DOSE AEPB Inhale 1 puff into the lungs 2 (two) times daily.   03/11/2016 at Unknown time  . GLIPIZIDE XL 10 MG 24 hr tablet Take 20 mg by mouth daily.   03/11/2016 at Unknown time  . hydrALAZINE (APRESOLINE) 100 MG tablet Take 100 mg by mouth 3 (three) times daily.   03/11/2016 at Unknown time  . losartan (COZAAR) 100 MG tablet Take 100 mg by mouth daily.   unknown  . meclizine (ANTIVERT) 12.5 MG tablet Take 1 tablet (12.5 mg total) by mouth 2 (two) times daily as needed for dizziness. 30 tablet 0 unknown  . metFORMIN (GLUCOPHAGE) 500 MG tablet Take 500 mg by mouth 2 (two) times daily with a meal.   03/11/2016 at Unknown time  . metoprolol (LOPRESSOR) 50 MG tablet Take 50 mg by mouth 2 (two) times daily.   03/11/2016 at 0500  . pantoprazole (PROTONIX) 40 MG tablet Take 1 tablet (40 mg total) by mouth daily. 30 tablet 0 unknown  . tamsulosin (FLOMAX) 0.4 MG CAPS capsule Take 1 capsule (0.4 mg total) by mouth daily. 30 capsule 0 unknown  . terazosin (HYTRIN) 10 MG capsule Take 10 mg by mouth at bedtime.  03/10/2016 at Unknown time    Current medications: Current Facility-Administered Medications  Medication Dose Route Frequency Provider Last Rate Last Dose  . 0.9 %  sodium chloride infusion   Intravenous Continuous Fritzi Mandes, MD 125 mL/hr at 03/12/16 1257    . acetaminophen (TYLENOL) tablet 650 mg  650 mg Oral Q6H PRN Bettey Costa, MD   650 mg at 03/12/16 1127   Or  . acetaminophen (TYLENOL) suppository 650 mg  650 mg Rectal Q6H PRN Bettey Costa, MD      . aspirin EC tablet 325 mg  325 mg Oral q morning - 10a Bettey Costa, MD   325 mg at 03/12/16 0926  . atorvastatin (LIPITOR) tablet 10 mg  10 mg Oral q1800 Bettey Costa, MD   10 mg at 03/11/16 1802  . DULoxetine (CYMBALTA) DR capsule 30 mg  30 mg Oral Daily Bettey Costa, MD   30 mg at 03/12/16 0926  . heparin injection 5,000 Units  5,000 Units Subcutaneous Q8H Bettey Costa, MD   5,000 Units at 03/12/16 0529  .  Influenza vac split quadrivalent PF (FLUARIX) injection 0.5 mL  0.5 mL Intramuscular Tomorrow-1000 Sital Mody, MD      . insulin aspart (novoLOG) injection 0-20 Units  0-20 Units Subcutaneous TID WC Bettey Costa, MD   3 Units at 03/12/16 1257  . meclizine (ANTIVERT) tablet 12.5 mg  12.5 mg Oral BID PRN Bettey Costa, MD      . mometasone-formoterol (DULERA) 200-5 MCG/ACT inhaler 2 puff  2 puff Inhalation BID Bettey Costa, MD   2 puff at 03/12/16 0926  . ondansetron (ZOFRAN) tablet 4 mg  4 mg Oral Q6H PRN Bettey Costa, MD       Or  . ondansetron (ZOFRAN) injection 4 mg  4 mg Intravenous Q6H PRN Sital Mody, MD      . oxyCODONE (Oxy IR/ROXICODONE) immediate release tablet 5 mg  5 mg Oral Q4H PRN Sital Mody, MD      . pantoprazole (PROTONIX) EC tablet 40 mg  40 mg Oral Daily Bettey Costa, MD   40 mg at 03/12/16 0926  . pneumococcal 23 valent vaccine (PNU-IMMUNE) injection 0.5 mL  0.5 mL Intramuscular Tomorrow-1000 Sital Mody, MD      . senna-docusate (Senokot-S) tablet 1 tablet  1 tablet Oral QHS PRN Bettey Costa, MD      . sodium chloride flush (NS) 0.9 % injection 3 mL  3 mL Intravenous Q12H Bettey Costa, MD   3 mL at 03/11/16 1644  . tamsulosin (FLOMAX) capsule 0.4 mg  0.4 mg Oral Daily Bettey Costa, MD   0.4 mg at 03/12/16 0926  . terazosin (HYTRIN) capsule 10 mg  10 mg Oral QHS Bettey Costa, MD   10 mg at 03/11/16 2131      Allergies: No Known Allergies    Past Medical History: Past Medical History:  Diagnosis Date  . Arthritis   . Asthma   . COPD (chronic obstructive pulmonary disease) ([place])   . Cough    chronic  . Diabetes mellitus without complication ([place])   . Edema   . Hypertension   . Orthopnea   . Oxygen deficit    o2 prn  . Shortness of breath dyspnea   . Sleep apnea    cpap     Past Surgical History: Past Surgical History:  Procedure Laterality Date  . CARDIAC CATHETERIZATION    . COLONOSCOPY    . EYE SURGERY    . KNEE ARTHROSCOPY  Family History: History reviewed. No  pertinent family history.   Social History: Social History   Social History  . Marital status: Unknown    Spouse name: N/A  . Number of children: N/A  . Years of education: N/A   Occupational History  . Not on file.   Social History Main Topics  . Smoking status: Current Every Day Smoker    Packs/day: 0.50    Years: 55.00  . Smokeless tobacco: Current User  . Alcohol use No  . Drug use: No  . Sexual activity: Not on file   Other Topics Concern  . Not on file   Social History Narrative  . No narrative on file     Review of Systems: Review of Systems  Constitutional: Positive for malaise/fatigue. Negative for chills and fever.  HENT: Negative for ear pain, hearing loss and tinnitus.   Eyes: Negative for blurred vision, double vision and pain.  Respiratory: Negative for cough, hemoptysis and sputum production.   Cardiovascular: Negative for chest pain, palpitations and orthopnea.  Gastrointestinal: Positive for diarrhea and nausea. Negative for heartburn and vomiting.  Genitourinary: Negative for dysuria, frequency and urgency.  Musculoskeletal: Positive for back pain.  Skin: Negative for itching and rash.  Neurological: Positive for dizziness. Negative for focal weakness and headaches.  Endo/Heme/Allergies: Negative for polydipsia. Does not bruise/bleed easily.  Psychiatric/Behavioral: Negative for depression. The patient is not nervous/anxious.      Vital Signs: Blood pressure (!) 142/58, pulse 70, temperature 97.8 F (36.6 C), temperature source Oral, resp. rate 18, height 5\' 10"  (1.778 m), weight 111.1 kg (245 lb), SpO2 92 %.  Weight trends: Filed Weights   03/11/16 1211  Weight: 111.1 kg (245 lb)    Physical Exam: General: NAD, sitting up in bed  Head: Normocephalic, atraumatic.  Eyes: Anicteric, EOMI  Nose: Mucous membranes moist, not inflammed, nonerythematous.  Throat: Oropharynx nonerythematous, no exudate appreciated.   Neck: Supple, trachea  midline.  Lungs:  Normal respiratory effort. Clear to auscultation BL without crackles or wheezes.  Heart: RRR. S1 and S2 normal without gallop, murmur, or rubs.  Abdomen:  BS normoactive. Soft, Nondistended, non-tender.  No masses or organomegaly.  Extremities: trace pretibial edema.  Neurologic: A&O X3, Motor strength is 5/5 in the all 4 extremities  Skin: No visible rashes, scars.    Lab results: Basic Metabolic Panel:  Recent Labs Lab 03/11/16 1201 03/12/16 0509  NA 137 140  K 4.1 3.5  CL 104 108  CO2 23 20*  GLUCOSE 129* 101*  BUN 30* 34*  CREATININE 3.48* 4.14*  CALCIUM 8.3* 7.8*    Liver Function Tests:  Recent Labs Lab 03/11/16 1201  AST 19  ALT 12*  ALKPHOS 245*  BILITOT 1.1  PROT 7.0  ALBUMIN 3.5   No results for input(s): LIPASE, AMYLASE in the last 168 hours. No results for input(s): AMMONIA in the last 168 hours.  CBC:  Recent Labs Lab 03/11/16 1201 03/12/16 0509  WBC 7.9 4.8  NEUTROABS 6.4  --   HGB 10.6* 9.6*  HCT 32.0* 29.8*  MCV 93.2 95.0  PLT 199 161    Cardiac Enzymes:  Recent Labs Lab 03/11/16 1201  TROPONINI <0.03    BNP: Invalid input(s): POCBNP  CBG:  Recent Labs Lab 03/11/16 1634 03/11/16 2113 03/12/16 0734 03/12/16 1133  GLUCAP 112* 109* 116* 125*    Microbiology: No results found for this or any previous visit.  Coagulation Studies: No results for input(s): LABPROT, INR in the  last 72 hours.  Urinalysis:  Recent Labs  03/11/16 1244  COLORURINE YELLOW*  LABSPEC 1.009  PHURINE 5.0  GLUCOSEU NEGATIVE  HGBUR NEGATIVE  BILIRUBINUR NEGATIVE  KETONESUR TRACE*  PROTEINUR NEGATIVE  NITRITE NEGATIVE  LEUKOCYTESUR NEGATIVE      Imaging: Dg Chest 2 View  Result Date: 03/11/2016 CLINICAL DATA:  Pt was discharged on Sunday after being admitted for a few days; pt states that he hasn't felt himself for the past week and states feeling worse; pt states this am when he got up he was very dizzy. Hx/o COPD  and is a smoker. EXAM: CHEST  2 VIEW COMPARISON:  03/03/2016 FINDINGS: There is linear right middle lobe opacity that is likely due to atelectasis. Remainder of the lungs is clear. No pleural effusion. No pneumothorax. Cardiac silhouette is normal in size. No mediastinal or hilar masses. No evidence of adenopathy. Skeletal structures are demineralized. IMPRESSION: No acute cardiopulmonary disease. Electronically Signed   By: Lajean Manes M.D.   On: 03/11/2016 12:31   Mr Brain Wo Contrast  Result Date: 03/11/2016 CLINICAL DATA:  70 year old male with dizziness. Recent headache. Initial encounter. EXAM: MRI HEAD WITHOUT CONTRAST TECHNIQUE: Multiplanar, multiecho pulse sequences of the brain and surrounding structures were obtained without intravenous contrast. COMPARISON:  Head CT without contrast 03/03/2016 and earlier. FINDINGS: Brain: Chronic lacunar infarcts scattered in the bilateral deep gray matter nuclei. Comparatively mild patchy superimposed bilateral cerebral white matter T2 and FLAIR hyperintensity. Moderate patchy T2 hyperintensity in the pons. There does appear to be a small area of cortical encephalomalacia in the right middle frontal gyrus on series 8, image 21. No other cortical encephalomalacia. No definite chronic cerebral blood products. No restricted diffusion to suggest acute infarction. No midline shift, mass effect, evidence of mass lesion, ventriculomegaly, extra-axial collection or acute intracranial hemorrhage. Cervicomedullary junction and pituitary are within normal limits. Vascular: Major intracranial vascular flow voids are preserved, dominant appearing distal left vertebral artery. Skull and upper cervical spine: Negative aside from chronic disc and endplate degeneration. Mildly heterogeneous bone marrow signal diffusely. Sinuses/Orbits: Postoperative changes to the right globe. Otherwise negative orbit soft tissues. Scattered mild paranasal sinus mucosal thickening. Negative scalp  soft tissues. Other: Grossly normal visualized internal auditory structures. There are minimal mastoid effusions greater on the left. Negative nasopharynx. IMPRESSION: 1.  No acute intracranial abnormality. 2. Moderately advanced deep gray matter nuclei chronic small vessel ischemia. Signal changes in the pons also likely due to chronic small vessel disease. 3. Mild paranasal sinus inflammation. Minimal mastoid effusions, most often are postinflammatory and significance is doubtful. Electronically Signed   By: Genevie Ann M.D.   On: 03/11/2016 15:49   US Renal  Result Date: 03/11/2016 CLINICAL DATA:  Acute renal insufficiency EXAM: RENAL / URINARY TRACT ULTRASOUND COMPLETE COMPARISON:  None. FINDINGS: Right Kidney: Length: 11.7 cm. Echogenicity within normal limits. No mass or hydronephrosis visualized. Left Kidney: Length: 11.5 cm. Echogenicity within normal limits. No mass or hydronephrosis visualized. Bladder: There is bladder wall thickening measuring 9 mm. IMPRESSION: 1. No hydronephrosis or renal abnormality to explain the patient's renal insufficiency. 2. Bladder wall thickening, a nonspecific finding. Electronically Signed   By: Dorise Bullion III M.D   On: 03/11/2016 14:33      Assessment & Plan: Pt is a 70 y.o. male with a PMHx of Osteoarthritis, asthma, COPD, diabetes mellitus type 2, hypertension, obstructive sleep apnea, who was admitted to [facility] on 03/11/2016 for evaluation of dizziness.  Patient was recently widowed [facility] from 03/03/2016 to 03/05/2016. At that point in time he presented with dizziness. 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  1. Acute renal failure. Most likely secondary to contrast exposure in the setting of possible volume depletion. Patient also on losartan at home. - Renal ultrasound was negative for obstruction. At this point in time the patient has presumed  prostate cancer with bony involvement of the spine. For now continue IV fluid hydration. No urgent indication for dialysis. We will check SPEP, UPEP for additional workup.  Agree with stopping losartan and metformin.  2. Hypertension. Agree with discontinuation of losartan as above. Continue terazosin for now.    3.  Dizziness:  Unclear why patient has dizziness, no intracranial findings to explain dizziness.  Continue volume repletion for now.  4.  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 PSA was 10.   2.

## 2016-03-13 ENCOUNTER — Encounter (INDEPENDENT_AMBULATORY_CARE_PROVIDER_SITE_OTHER): Payer: Self-pay

## 2016-03-13 ENCOUNTER — Ambulatory Visit (INDEPENDENT_AMBULATORY_CARE_PROVIDER_SITE_OTHER): Payer: Self-pay | Admitting: Vascular Surgery

## 2016-03-13 LAB — CREATININE, SERUM
Creatinine, Ser: 3.29 mg/dL — ABNORMAL HIGH (ref 0.61–1.24)
GFR, EST AFRICAN AMERICAN: 20 mL/min — AB (ref >60)
GFR, EST NON AFRICAN AMERICAN: 17 mL/min — AB (ref >60)

## 2016-03-13 LAB — GLUCOSE, CAPILLARY
GLUCOSE-CAPILLARY: 119 mg/dL — AB (ref 65–99)
GLUCOSE-CAPILLARY: 128 mg/dL — AB (ref 65–99)
GLUCOSE-CAPILLARY: 106 mg/dL — AB (ref 65–99)
GLUCOSE-CAPILLARY: 108 mg/dL — AB (ref 65–99)

## 2016-03-13 MED ORDER — METOPROLOL TARTRATE 5 MG/5ML IV SOLN
5.0000 mg | Freq: Once | INTRAVENOUS | Status: AC
  Administered 2016-03-13: 5 mg via INTRAVENOUS
  Filled 2016-03-13: qty 5

## 2016-03-13 MED ORDER — HYDRALAZINE HCL 20 MG/ML IJ SOLN
10.0000 mg | Freq: Once | INTRAMUSCULAR | Status: AC
  Administered 2016-03-13: 10 mg via INTRAVENOUS
  Filled 2016-03-13: qty 1

## 2016-03-13 NOTE — Progress Notes (Signed)
Pt upset about the bed alarm.  He continues to remove it from the chair.  I explained to him the importance of the alarm but he does not want it

## 2016-03-13 NOTE — Progress Notes (Signed)
[facility]  ROUNDING NOTE    Subjective:   Patient sitting in chair. No complaints.   Creatinine 3.29 (4.14)  Objective:  Vital signs in last 24 hours:  Temp:  [97.3 F (36.3 C)-98.1 F (36.7 C)] 98 F (36.7 C) (10/02 1119) Pulse Rate:  [68-101] 100 (10/02 1119) Resp:  [18-24] 20 (10/02 1119) BP: (125-193)/(57-86) 126/72 (10/02 1119) SpO2:  [92 %-95 %] 95 % (10/02 0653)  Weight change:  Filed Weights   03/11/16 1211  Weight: 111.1 kg (245 lb)    Intake/Output: I/O last 3 completed shifts: In: 4249.8 [P.O.:480; I.V.:3769.8] Out: 1450 [Urine:1450]   Intake/Output this shift:  Total I/O In: 488 [I.V.:488] Out: 200 [Urine:200]  Physical Exam: eneral: NAD, sitting in chair  Head: Normocephalic, atraumatic. Moist oral mucosal membranes  Eyes: Anicteric, PERRL  Neck: Supple, trachea midline  Lungs:  Clear to auscultation  Heart: Regular rate and rhythm  Abdomen:  Soft, nontender,   Extremities: no peripheral edema.  Neurologic: Nonfocal, moving all four extremities  Skin: No lesions       Basic Metabolic Panel:  Recent Labs Lab 03/11/16 1201 03/12/16 0509 03/13/16 0752  NA 137 140  --   K 4.1 3.5  --   CL 104 108  --   CO2 23 20*  --   GLUCOSE 129* 101*  --   BUN 30* 34*  --   CREATININE 3.48* 4.14* 3.29*  CALCIUM 8.3* 7.8*  --     Liver Function Tests:  Recent Labs Lab 03/11/16 1201  AST 19  ALT 12*  ALKPHOS 245*  BILITOT 1.1  PROT 7.0  ALBUMIN 3.5   No results for input(s): LIPASE, AMYLASE in the last 168 hours. No results for input(s): AMMONIA in the last 168 hours.  CBC:  Recent Labs Lab 03/11/16 1201 03/12/16 0509  WBC 7.9 4.8  NEUTROABS 6.4  --   HGB 10.6* 9.6*  HCT 32.0* 29.8*  MCV 93.2 95.0  PLT 199 161    Cardiac Enzymes:  Recent Labs Lab 03/11/16 1201  TROPONINI <0.03    BNP: Invalid input(s): POCBNP  CBG:  Recent Labs Lab 03/12/16 1133 03/12/16 1626 03/12/16 2141 03/13/16 0734  03/13/16 1136  GLUCAP 125* 114* 122* 119* 128*    Microbiology: No results found for this or any previous visit.  Coagulation Studies: No results for input(s): LABPROT, INR in the last 72 hours.  Urinalysis:  Recent Labs  03/11/16 1244  [place] 1.009  PHURINE 5.0  GLUCOSEU NEGATIVE  HGBUR NEGATIVE  BILIRUBINUR NEGATIVE  KETONESUR TRACE*  PROTEINUR NEGATIVE  NITRITE NEGATIVE  LEUKOCYTESUR NEGATIVE      Imaging: Mr Brain Wo Contrast  Result Date: 03/11/2016 CLINICAL DATA:  70 year old male with dizziness. Recent headache. Initial encounter. EXAM: MRI HEAD WITHOUT CONTRAST TECHNIQUE: Multiplanar, multiecho pulse sequences of the brain and surrounding structures were obtained without intravenous contrast. COMPARISON:  Head CT without contrast 03/03/2016 and earlier. FINDINGS: Brain: Chronic lacunar infarcts scattered in the bilateral deep gray matter nuclei. Comparatively mild patchy superimposed bilateral cerebral white matter T2 and FLAIR hyperintensity. Moderate patchy T2 hyperintensity in the pons. There does appear to be a small area of cortical encephalomalacia in the right middle frontal gyrus on series 8, image 21. No other cortical encephalomalacia. No definite chronic cerebral blood products. No restricted diffusion to suggest acute infarction. No midline shift, mass effect, evidence of mass lesion, ventriculomegaly, extra-axial collection or acute intracranial hemorrhage. Cervicomedullary junction and pituitary are within  normal limits. Vascular: Major intracranial vascular flow voids are preserved, dominant appearing distal left vertebral artery. Skull and upper cervical spine: Negative aside from chronic disc and endplate degeneration. Mildly heterogeneous bone marrow signal diffusely. Sinuses/Orbits: Postoperative changes to the right globe. Otherwise negative orbit soft tissues. Scattered mild paranasal sinus mucosal thickening. Negative scalp soft  tissues. Other: Grossly normal visualized internal auditory structures. There are minimal mastoid effusions greater on the left. Negative nasopharynx. IMPRESSION: 1.  No acute intracranial abnormality. 2. Moderately advanced deep gray matter nuclei chronic small vessel ischemia. Signal changes in the pons also likely due to chronic small vessel disease. 3. Mild paranasal sinus inflammation. Minimal mastoid effusions, most often are postinflammatory and significance is doubtful. Electronically Signed   By: Genevie Ann M.D.   On: 03/11/2016 15:49   US Renal  Result Date: 03/11/2016 CLINICAL DATA:  Acute renal insufficiency EXAM: RENAL / URINARY TRACT ULTRASOUND COMPLETE COMPARISON:  None. FINDINGS: Right Kidney: Length: 11.7 cm. Echogenicity within normal limits. No mass or hydronephrosis visualized. Left Kidney: Length: 11.5 cm. Echogenicity within normal limits. No mass or hydronephrosis visualized. Bladder: There is bladder wall thickening measuring 9 mm. IMPRESSION: 1. No hydronephrosis or renal abnormality to explain the patient's renal insufficiency. 2. Bladder wall thickening, a nonspecific finding. Electronically Signed   By: Dorise Bullion III M.D   On: 03/11/2016 14:33     Medications:   . sodium chloride 125 mL/hr at 03/13/16 0753   . aspirin EC  325 mg Oral q morning - 10a  . atorvastatin  10 mg Oral q1800  . DULoxetine  30 mg Oral Daily  . heparin  5,000 Units Subcutaneous Q8H  . insulin aspart  0-20 Units Subcutaneous TID WC  . mometasone-formoterol  2 puff Inhalation BID  . pantoprazole  40 mg Oral Daily  . sodium chloride flush  3 mL Intravenous Q12H  . tamsulosin  0.4 mg Oral Daily   acetaminophen **OR** acetaminophen, meclizine, ondansetron **OR** ondansetron (ZOFRAN) IV, oxyCODONE, senna-docusate  Assessment/ Plan:  Michael Mathews is a 70 y.o. white male with Osteoarthritis, asthma, COPD, diabetes mellitus type 2, hypertension, obstructive sleep apnea, who was admitted to  [facility] on 03/11/2016 for evaluation of dizziness.    1. Acute renal failure: on chronic kidney disease stage III: baseline GFR of 54 from 2015. Acute renal failure most likely secondary to contrast exposure in the setting of possible volume depletion. Patient also on losartan at home. - holding losartan - discontinued metformin - Continue to monitor renal function - Discontinue IV fluids  2. Hypertension: blood pressure at goal. Holding losartan - tamsulosin  3.  Anemia with kidney failure: hemoglobin 9.6. Not a candidate for epo with metastatic cancer  4. Diabetes Mellitus type II with chronic kidney disease: off metformin.    LOS: [place], [place] 10/2/201712:55 PM

## 2016-03-13 NOTE — Progress Notes (Signed)
[facility] [place] NAME: Michael Mathews    MR#:  [id number]  DATE OF BIRTH:  1946-02-05  SUBJECTIVE:   Came with dizzy spell and few falls at home Out in the chair  REVIEW OF SYSTEMS:   Review of Systems  Constitutional: Negative for chills, fever and weight loss.  HENT: Negative for ear discharge, ear pain and nosebleeds.   Eyes: Negative for blurred vision, pain and discharge.  Respiratory: Negative for sputum production, shortness of breath, wheezing and stridor.   Cardiovascular: Negative for chest pain, palpitations, orthopnea and PND.  Gastrointestinal: Negative for abdominal pain, diarrhea, nausea and vomiting.  Genitourinary: Negative for frequency and urgency.  Musculoskeletal: Positive for back pain and falls. Negative for joint pain.  Neurological: Positive for weakness. Negative for sensory change, speech change and focal weakness.  Psychiatric/Behavioral: Negative for depression and hallucinations. The patient is not nervous/anxious.    Tolerating Diet:yes Tolerating PT: pending  DRUG ALLERGIES:  No Known Allergies  VITALS:  Blood pressure 125/69, pulse (!) 101, temperature 97.7 F (36.5 C), temperature source Oral, resp. rate 20, height 5\' 10"  (1.778 m), weight 111.1 kg (245 lb), SpO2 95 %.  PHYSICAL EXAMINATION:   Physical Exam  GENERAL:  70 y.o.-year-old patient lying in the bed with no acute distress.  EYES: Pupils equal, round, reactive to light and accommodation. No scleral icterus. Extraocular muscles intact.  HEENT: Head atraumatic, normocephalic. Oropharynx and nasopharynx clear.  NECK:  Supple, no jugular venous distention. No thyroid enlargement, no tenderness.  LUNGS: Normal breath sounds bilaterally, no wheezing, rales, rhonchi. No use of accessory muscles of respiration.  CARDIOVASCULAR: S1, S2 normal. No murmurs, rubs, or gallops.  ABDOMEN: Soft, nontender, nondistended. Bowel sounds present. No  organomegaly or mass.  EXTREMITIES: No cyanosis, clubbing or edema b/l.    NEUROLOGIC: Cranial nerves II through XII are intact. No focal Motor or sensory deficits b/l.   PSYCHIATRIC:  patient is alert and oriented x 3.  SKIN: No obvious rash, lesion, or ulcer.   LABORATORY PANEL:  CBC  Recent Labs Lab 03/12/16 0509  WBC 4.8  HGB 9.6*  HCT 29.8*  PLT 161    Chemistries   Recent Labs Lab 03/11/16 1201 03/12/16 0509  NA 137 140  K 4.1 3.5  CL 104 108  CO2 23 20*  GLUCOSE 129* 101*  BUN 30* 34*  CREATININE 3.48* 4.14*  CALCIUM 8.3* 7.8*  AST 19  --   ALT 12*  --   ALKPHOS 245*  --   BILITOT 1.1  --    Cardiac Enzymes  Recent Labs Lab 03/11/16 1201  TROPONINI <0.03   RADIOLOGY:  Dg Chest 2 View  Result Date: 03/11/2016 CLINICAL DATA:  Pt was discharged on Sunday after being admitted for a few days; pt states that he hasn't felt himself for the past week and states feeling worse; pt states this am when he got up he was very dizzy. Hx/o COPD and is a smoker. EXAM: CHEST  2 VIEW COMPARISON:  03/03/2016 FINDINGS: There is linear right middle lobe opacity that is likely due to atelectasis. Remainder of the lungs is clear. No pleural effusion. No pneumothorax. Cardiac silhouette is normal in size. No mediastinal or hilar masses. No evidence of adenopathy. Skeletal structures are demineralized. IMPRESSION: No acute cardiopulmonary disease. Electronically Signed   By: Lajean Manes M.D.   On: 03/11/2016 12:31   Mr Brain Wo Contrast  Result Date: 03/11/2016 CLINICAL  DATA:  70 year old male with dizziness. Recent headache. Initial encounter. EXAM: MRI HEAD WITHOUT CONTRAST TECHNIQUE: Multiplanar, multiecho pulse sequences of the brain and surrounding structures were obtained without intravenous contrast. COMPARISON:  Head CT without contrast 03/03/2016 and earlier. FINDINGS: Brain: Chronic lacunar infarcts scattered in the bilateral deep gray matter nuclei. Comparatively mild  patchy superimposed bilateral cerebral white matter T2 and FLAIR hyperintensity. Moderate patchy T2 hyperintensity in the pons. There does appear to be a small area of cortical encephalomalacia in the right middle frontal gyrus on series 8, image 21. No other cortical encephalomalacia. No definite chronic cerebral blood products. No restricted diffusion to suggest acute infarction. No midline shift, mass effect, evidence of mass lesion, ventriculomegaly, extra-axial collection or acute intracranial hemorrhage. Cervicomedullary junction and pituitary are within normal limits. Vascular: Major intracranial vascular flow voids are preserved, dominant appearing distal left vertebral artery. Skull and upper cervical spine: Negative aside from chronic disc and endplate degeneration. Mildly heterogeneous bone marrow signal diffusely. Sinuses/Orbits: Postoperative changes to the right globe. Otherwise negative orbit soft tissues. Scattered mild paranasal sinus mucosal thickening. Negative scalp soft tissues. Other: Grossly normal visualized internal auditory structures. There are minimal mastoid effusions greater on the left. Negative nasopharynx. IMPRESSION: 1.  No acute intracranial abnormality. 2. Moderately advanced deep gray matter nuclei chronic small vessel ischemia. Signal changes in the pons also likely due to chronic small vessel disease. 3. Mild paranasal sinus inflammation. Minimal mastoid effusions, most often are postinflammatory and significance is doubtful. Electronically Signed   By: Genevie Ann M.D.   On: 03/11/2016 15:49   US Renal  Result Date: 03/11/2016 CLINICAL DATA:  Acute renal insufficiency EXAM: RENAL / URINARY TRACT ULTRASOUND COMPLETE COMPARISON:  None. FINDINGS: Right Kidney: Length: 11.7 cm. Echogenicity within normal limits. No mass or hydronephrosis visualized. Left Kidney: Length: 11.5 cm. Echogenicity within normal limits. No mass or hydronephrosis visualized. Bladder: There is bladder wall  thickening measuring 9 mm. IMPRESSION: 1. No hydronephrosis or renal abnormality to explain the patient's renal insufficiency. 2. Bladder wall thickening, a nonspecific finding. Electronically Signed   By: Dorise Bullion III M.D   On: 03/11/2016 14:33   ASSESSMENT AND PLAN:   70 year old obese male with a history of peripheral vascular disease and hypertension who presents again with dizziness after being in the hospital last week for same issue and underwent echocardiogram and carotid Doppler which were unremarkable.  1. Dizziness: -MRI  Negative Orthostatic vital signs normal. Hold clonidine as side effect is dizziness. Telemetry for bradycardia as etiology of dizziness. When necessary meclizine for dizziness  2. Bradycardia, symptomatic with dizziness: Hold metoprolol for now.  3. Acute renal failure: This is most likely due to contrast-induced nephropathy  -Cont IV fluids. Renal ultrasound ordered Nephrology consult appreciated Hold nephrotoxic agents including losartan and metformin. Repeat BMP in a.m. UOP 1100 cc  4. Essential hypertension: Monitor blood pressure while discontinuing clonidine and metoprolol as well as losartan. Continue hydralazine.  5. Tobacco dependence: Patient is highly encouraged to stop smoking. He was counseled for 3 minutes regarding stopping smoking. He is in a contemplative state.  6. Diabetes: Stop metformin due to problem #3 High-dose sliding scale insulin. Monitor blood sugars  7. Peripheral vascular disease: Continue aspirin and atorvastatin.  8. 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 Hollice Espy at discharge. Continue Flomax.   PT to see pt CSW and CM for d/c planning  Case discussed with Care Management/Social Worker. Management plans  discussed with the patient, family and they are in agreement.  CODE STATUS:DNR  DVT Prophylaxis: Heparin TOTAL TIME TAKING  CARE OF THIS PATIENT: 30 minutes.  >50% time spent on counselling and coordination of care  POSSIBLE D/C IN 1-2 DAYS, DEPENDING ON CLINICAL CONDITION.  Note: This dictation was prepared with Dragon dictation along with smaller phrase technology. Any transcriptional errors that result from this process are unintentional.  Mitsue Peery M.D on 03/13/2016 at 8:14 AM  Between 7am to 6pm - Pager - [phone]  After 6pm go to [email or web address] - password [email or web address] [facility]  Office  [phone]  CC: Primary care physician; [name], MD

## 2016-03-13 NOTE — Evaluation (Signed)
Physical Therapy Evaluation Patient Details Name: Michael Mathews MRN: [id number] DOB: 05/11/1946 Today's Date: 03/13/2016   History of Present Illness  Pt is a 70 year old obese male with a history of peripheral vascular disease and hypertension who presents with dizziness, bradycardia, acute renal failure, and probable metastatic prostate CA.   Clinical Impression  Pt presents with deficits in gait, balance, and activity tolerance.  Gait training with and without AD with pt able to amb 200' max with SBA and RW.  Pt reported feeling like "legs were giving out" after 200'.  After therapeutic rest break pt able to amb 2 x 50' without AD with CGA with min instability and slight left/right drifting.  Pt reported feeling less steady without AD and recommended HHPT and for pt to use RW for amb at this time. Pt will benefit from PT services to address above deficits for decreased fall risk and a safe return home.         Follow Up Recommendations Home health PT    Equipment Recommendations  Rolling walker with 5" wheels    Recommendations for Other Services       Precautions / Restrictions Precautions Precautions: Fall Restrictions Weight Bearing Restrictions: No      Mobility  Bed Mobility Overal bed mobility: Independent                Transfers Overall transfer level: Modified independent Equipment used: None;Rolling walker (2 wheeled)             General transfer comment: Sit<>stand with good body mechanics and safety awareness; good balance  Ambulation/Gait Ambulation/Gait assistance: Supervision with RW, CGA without AD Ambulation Distance (Feet): 200 Feet with RW, 2 x 50' without AD.  Assistive device: Rolling walker (2 wheeled);None     Gait velocity interpretation: Below normal speed for age/gender General Gait Details: Pt steady with gait with RW but presented with min instability and slight drifting L/R without AD  Stairs            Wheelchair  Mobility    Modified Rankin (Stroke Patients Only)       Balance Overall balance assessment: Needs assistance   Sitting balance-Leahy Scale: Normal     Standing balance support: No upper extremity supported Standing balance-Leahy Scale: Good   Single Leg Stance - Right Leg: 2 Single Leg Stance - Left Leg: 3                         Pertinent Vitals/Pain Pain Assessment: No/denies pain    Home Living Family/patient expects to be discharged to:: Private residence Living Arrangements: Alone   Type of Home: Mobile home Home Access: Stairs to enter Entrance Stairs-Rails: Left Entrance Stairs-Number of Steps: 6 Home Layout: One level Home Equipment: None      Prior Function Level of Independence: Independent         Comments: Ind amb limited community distances without AD, ind with ADLs, only one fall in last year secondary to recent onset of dizziness     Hand Dominance        Extremity/Trunk Assessment   Upper Extremity Assessment: Overall WFL for tasks assessed           Lower Extremity Assessment: Overall WFL for tasks assessed         Communication   Communication: No difficulties  Cognition Arousal/Alertness: Awake/alert Behavior During Therapy: WFL for tasks assessed/performed Overall Cognitive Status: Within Functional Limits for tasks assessed  General Comments      Exercises Other Exercises Other Exercises: Static standing balance training with feet apart, feet together, and semi tandem with eyes open/closed   Assessment/Plan    PT Assessment Patient needs continued PT services  PT Problem List Decreased balance;Decreased activity tolerance          PT Treatment Interventions DME instruction;Gait training;Stair training;Balance training;Neuromuscular re-education;Therapeutic activities;Therapeutic exercise;Patient/family education    PT Goals (Current goals can be found in the Care Plan  section)  Acute Rehab PT Goals Patient Stated Goal: To be able to walk more PT Goal Formulation: With patient Time For Goal Achievement: 03/26/16 Potential to Achieve Goals: Good    Frequency Min 2X/week   Barriers to discharge        Co-evaluation               End of Session Equipment Utilized During Treatment: Gait belt Activity Tolerance: Patient limited by fatigue Patient left: in chair;with call bell/phone within reach;Other (comment) (Per nursing, pt refuses chair alarm)           Time: 1445-1520 PT Time Calculation (min) (ACUTE ONLY): 35 min   Charges:   PT Evaluation $PT Eval Low Complexity: 1 Procedure PT Treatments $Gait Training: 8-22 mins   PT G Codes:        DRoyetta Asal PT, DPT 03/13/16, 4:26 PM

## 2016-03-13 NOTE — Progress Notes (Addendum)
MD gave orders to discontinue telemetry.  PT order given

## 2016-03-13 NOTE — Progress Notes (Signed)
Pt restless today.  Eager to go home but unsure how he will do at home on his own.

## 2016-03-14 LAB — PROTEIN ELECTROPHORESIS, SERUM
A/G Ratio: 1.1 (ref 0.7–1.7)
Albumin ELP: 3 g/dL (ref 2.9–4.4)
Alpha-1-Globulin: 0.2 g/dL (ref 0.0–0.4)
Alpha-2-Globulin: 0.9 g/dL (ref 0.4–1.0)
Beta Globulin: 0.9 g/dL (ref 0.7–1.3)
Gamma Globulin: 0.9 g/dL (ref 0.4–1.8)
Globulin, Total: 2.8 g/dL (ref 2.2–3.9)
TOTAL PROTEIN ELP: 5.8 g/dL — AB (ref 6.0–8.5)

## 2016-03-14 LAB — PROTEIN ELECTRO, RANDOM URINE
ALPHA-1-GLOBULIN, U: 3.4 %
ALPHA-2-GLOBULIN, U: 9.8 %
Albumin ELP, Urine: 40.3 %
Beta Globulin, U: 23.7 %
Gamma Globulin, U: 22.8 %
TOTAL PROTEIN, URINE-UPE24: 17.6 mg/dL

## 2016-03-14 LAB — GLUCOSE, CAPILLARY
Glucose-Capillary: 121 mg/dL — ABNORMAL HIGH (ref 65–99)
Glucose-Capillary: 95 mg/dL (ref 65–99)

## 2016-03-14 LAB — BASIC METABOLIC PANEL
ANION GAP: 11 (ref 5–15)
BUN: 22 mg/dL — AB (ref 6–20)
CALCIUM: 8.7 mg/dL — AB (ref 8.9–10.3)
CO2: 18 mmol/L — ABNORMAL LOW (ref 22–32)
CREATININE: 2.09 mg/dL — AB (ref 0.61–1.24)
Chloride: 115 mmol/L — ABNORMAL HIGH (ref 101–111)
GFR calc Af Amer: 35 mL/min — ABNORMAL LOW (ref >60)
GFR, EST NON AFRICAN AMERICAN: 30 mL/min — AB (ref >60)
GLUCOSE: 114 mg/dL — AB (ref 65–99)
Potassium: 3.7 mmol/L (ref 3.5–5.1)
Sodium: 144 mmol/L (ref 135–145)

## 2016-03-14 MED ORDER — CLONIDINE HCL 0.1 MG PO TABS
0.3000 mg | ORAL_TABLET | Freq: Every day | ORAL | Status: DC
  Administered 2016-03-14: 0.3 mg via ORAL
  Filled 2016-03-14: qty 3

## 2016-03-14 MED ORDER — METOPROLOL TARTRATE 50 MG PO TABS
50.0000 mg | ORAL_TABLET | Freq: Two times a day (BID) | ORAL | Status: DC
  Administered 2016-03-14: 50 mg via ORAL
  Filled 2016-03-14: qty 1

## 2016-03-14 MED ORDER — HYDRALAZINE HCL 20 MG/ML IJ SOLN
10.0000 mg | Freq: Once | INTRAMUSCULAR | Status: AC
Start: 2016-03-14 — End: 2016-03-14
  Administered 2016-03-14: 10 mg via INTRAVENOUS
  Filled 2016-03-14: qty 1

## 2016-03-14 NOTE — Progress Notes (Signed)
Patient discharged to home. Concerns addressed, IV site removed. DNR form given to patient. Walker given.

## 2016-03-14 NOTE — Discharge Summary (Signed)
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 Arthritis   . Asthma   . COPD (chronic obstructive pulmonary disease) ([place])   . Cough    chronic  . Diabetes mellitus without complication ([place])   . Edema   . Hypertension   . Orthopnea   . Oxygen deficit    o2 prn  . Shortness of breath dyspnea   . Sleep apnea    cpap    HOSPITAL COURSE:   70 year old obese male with a history of peripheral vascular disease and hypertension who presents again with dizziness after being in the hospital last week for same issue and underwent echocardiogram and carotid Doppler which were unremarkable.  1. Dizziness: -MRI  Negative Orthostatic vital signs normal. Telemetry NSR  When necessary meclizine for dizziness  2. Bradycardia resolved Resume metoprolol  3. Acute on chronic renal failure CKDIII/IV: This is most likely due to contrast-induced nephropathy  -recieved IV fluids. Renal ultrasound normal-no obstructive uropathy Nephrology consult appreciated---f/u out pt per Dr Juleen [place] Hold nephrotoxic agents including losartan and metformin. Creat down to 3.29 Bladder scan neg-not retaining urine UOP 400 cc  4. Essential hypertension Continue hydralazine now resumed clonidine and BB -d/c losartan  5. Tobacco dependence: Patient is highly encouraged to stop smoking. He was counseled for 3 minutes regarding stopping  smoking. He is in a contemplative state.  6. Diabetes: Stop metformin due to problem #3 High-dose sliding scale insulin. Monitor blood sugars On po glipizide  7. Peripheral vascular disease: Continue aspirin and atorvastatin.  8. 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 Hollice Espy at discharge. Continue Flomax. D/ced terazosin  D/c  Home HHPT and RW  CONSULTS OBTAINED:  Treatment Team:  Munsoor Lateef, MD  DRUG ALLERGIES:  No Known Allergies  DISCHARGE MEDICATIONS:   Current Discharge Medication List    CONTINUE these medications which have NOT CHANGED   Details  aspirin EC 325 MG tablet Take 325 mg by mouth every morning.    atorvastatin (LIPITOR) 10 MG tablet Take 10 mg by mouth daily.    cloNIDine (CATAPRES) 0.3 MG tablet Take 0.3 mg by mouth daily.    DULoxetine (CYMBALTA) 30 MG capsule Take 30 mg by mouth daily.    Fluticasone-Salmeterol (ADVAIR) 250-50 MCG/DOSE AEPB Inhale 1 puff into the lungs 2 (two) times daily.    GLIPIZIDE XL 10 MG 24 hr tablet Take 20 mg by mouth daily.    hydrALAZINE (APRESOLINE) 100 MG tablet Take 100 mg by mouth 3 (three) times daily.    meclizine (ANTIVERT) 12.5 MG tablet Take 1 tablet (12.5 mg total) by mouth 2 (two) times daily as needed for dizziness. Qty: 30 tablet, Refills: 0    metoprolol (LOPRESSOR) 50 MG tablet Take 50 mg by mouth 2 (two) times daily.    pantoprazole (PROTONIX) 40 MG tablet Take 1 tablet (40 mg total) by mouth daily. Qty: 30  tablet, Refills: 0    tamsulosin (FLOMAX) 0.4 MG CAPS capsule Take 1 capsule (0.4 mg total) by mouth daily. Qty: 30 capsule, Refills: 0      STOP taking these medications     losartan (COZAAR) 100 MG tablet      metFORMIN (GLUCOPHAGE) 500 MG tablet      terazosin (HYTRIN) 10 MG capsule         If you experience worsening of your admission symptoms, develop shortness of breath, life threatening emergency,  suicidal or homicidal thoughts you must seek medical attention immediately by calling 911 or calling your MD immediately  if symptoms less severe.  You Must read complete instructions/literature along with all the possible adverse reactions/side effects for all the Medicines you take and that have been prescribed to you. Take any new Medicines after you have completely understood and accept all the possible adverse reactions/side effects.   Please note  You were cared for by a hospitalist during your hospital stay. If you have any questions about your discharge medications or the care you received while you were in the hospital after you are discharged, you can call the unit and asked to speak with the hospitalist on call if the hospitalist that took care of you is not available. Once you are discharged, your primary care physician will handle any further medical issues. Please note that NO REFILLS for any discharge medications will be authorized once you are discharged, as it is imperative that you return to your primary care physician (or establish a relationship with a primary care physician if you do not have one) for your aftercare needs so that they can reassess your need for medications and monitor your lab values. Today   SUBJECTIVE   No new complaints  VITAL SIGNS:  Blood pressure (!) 182/71, pulse 93, temperature 98.6 F (37 C), temperature source Oral, resp. rate 20, height 5\' 10"  (1.778 m), weight 111.1 kg (245 lb), SpO2 97 %.  I/O:   Intake/Output Summary (Last 24 hours) at 03/14/16 0824 Last data filed at 03/14/16 0800  Gross per 24 hour  Intake          3279.07 ml  Output              200 ml  Net          3079.07 ml    PHYSICAL EXAMINATION:  GENERAL:  70 y.o.-year-old patient lying in the bed with no acute distress.  EYES: Pupils equal, round, reactive to light and accommodation. No scleral icterus. Extraocular muscles intact.  HEENT: Head atraumatic, normocephalic.  Oropharynx and nasopharynx clear.  NECK:  Supple, no jugular venous distention. No thyroid enlargement, no tenderness.  LUNGS: Normal breath sounds bilaterally, no wheezing, rales,rhonchi or crepitation. No use of accessory muscles of respiration.  CARDIOVASCULAR: S1, S2 normal. No murmurs, rubs, or gallops.  ABDOMEN: Soft, non-tender, non-distended. Bowel sounds present. No organomegaly or mass.  EXTREMITIES: No pedal edema, cyanosis, or clubbing.  NEUROLOGIC: Cranial nerves II through XII are intact. Muscle strength 5/5 in all extremities. Sensation intact. Gait not checked.  PSYCHIATRIC: The patient is alert and oriented x 3.  SKIN: No obvious rash, lesion, or ulcer.   DATA REVIEW:   CBC   Recent Labs Lab 03/12/16 0509  WBC 4.8  HGB 9.6*  HCT 29.8*  PLT 161    Chemistries   Recent Labs Lab 03/11/16 1201 03/12/16 0509 03/13/16 0752  NA 137 140  --   K 4.1 3.5  --  CL 104 108  --   CO2 23 20*  --   GLUCOSE 129* 101*  --   BUN 30* 34*  --   CREATININE 3.48* 4.14* 3.29*  CALCIUM 8.3* 7.8*  --   AST 19  --   --   ALT 12*  --   --   ALKPHOS 245*  --   --   BILITOT 1.1  --   --     Microbiology Results   No results found for this or any previous visit (from the past 240 hour(s)).  RADIOLOGY:  No results found.   Management plans discussed with the patient, family and they are in agreement.  CODE STATUS:     Code Status Orders        Start     Ordered   03/11/16 1342  Do not attempt resuscitation (DNR)  Continuous    Question Answer Comment  In the event of cardiac or respiratory ARREST Do not call a "code blue"   In the event of cardiac or respiratory ARREST Do not perform Intubation, CPR, defibrillation or ACLS   In the event of cardiac or respiratory ARREST Use medication by any route, position, wound care, and other measures to relive pain and suffering. May use oxygen, suction and manual treatment of airway obstruction as needed for comfort.       03/11/16 1341    Code Status History    Date Active Date Inactive Code Status Order ID Comments User Context   03/03/2016  3:36 PM 03/04/2016  7:06 AM DNR [id number]  Bettey Costa, MD Inpatient    [facility] Directive Documentation   Flowsheet Row Most Recent Value  Type of Advance Directive  Out of facility DNR (pink MOST or yellow form)  Pre-existing out of facility DNR order (yellow form or pink MOST form)  Yellow form placed in chart (order not valid for inpatient use)  "MOST" Form in Place?  No data      TOTAL TIME TAKING CARE OF THIS PATIENT:40  minutes.    Yves Fodor M.D on 03/14/2016 at 8:24 AM  Between 7am to 6pm - Pager - [phone] After 6pm go to [email or web address] - password [email or web address] [facility]  Office  [phone]  CC: Primary care physician; [name], MD

## 2016-03-14 NOTE — Care Management (Signed)
Patient admitted for dizziness and ARF.  Patient lives at home alone.  Patient states that he does not have any family that lives locally.  Per patient at baseline he is independent.  Patient PCP [facility], and uses their pharmacy. Denies any issues obtaining medications.  PT has assessed patient and recommended home health PT.  Patient was provided agency preference list, and home health services were declined.  Order placed for Michael Mathews, jason with [facility] will deliver prior to discharge.  RNCM signing off.

## 2016-03-14 NOTE — Progress Notes (Signed)
Brief Nutrition Education Note:   Dietitian Consult received for diet education. Pt with HTN, DM, CKD III/IV. Provided pt with Diabetes and CKD stages 1 through 4 Nutrition Therapy handout from the Academy of Nutrition and Dietetics. Reviewed key points of diet including moderate protein intake, adherence to low sodium and diabetic diets.   Pt receptive to education, adherence likely.  Pt reports appetite has been good, eats 4-5 smaller meals per day, weight has been stable. Pt to be discharged to home today.   Kerman Passey Girard, [place], LDN [phone] Pager  [phone] Weekend/On-Call Pager

## 2016-03-14 NOTE — Discharge Instructions (Signed)
Renal diet

## 2016-03-14 NOTE — Progress Notes (Signed)
[facility]  ROUNDING NOTE    Subjective:   Patient sitting in chair. No complaints.   Creatinine 2.09 (3.29 )(4.14)  Objective:  Vital signs in last 24 hours:  Temp:  [98 F (36.7 C)-98.6 F (37 C)] 98.6 F (37 C) (10/03 0419) Pulse Rate:  [73-109] 93 (10/03 0425) Resp:  [16-20] 20 (10/03 0419) BP: (126-202)/(65-135) 182/71 (10/03 0425) SpO2:  [96 %-99 %] 97 % (10/03 0425)  Weight change:  Filed Weights   03/11/16 1211  Weight: 111.1 kg (245 lb)    Intake/Output: I/O last 3 completed shifts: In: 4813.1 [P.O.:240; I.V.:4573.1] Out: 1300 [Urine:1300]   Intake/Output this shift:  Total I/O In: 15 [I.V.:15] Out: -   Physical Exam: eneral: NAD, sitting in chair  Head: Normocephalic, atraumatic. Moist oral mucosal membranes  Eyes: Anicteric, PERRL  Neck: Supple, trachea midline  Lungs:  Clear to auscultation  Heart: Regular rate and rhythm  Abdomen:  Soft, nontender,   Extremities: no peripheral edema.  Neurologic: Nonfocal, moving all four extremities  Skin: No lesions       Basic Metabolic Panel:  Recent Labs Lab 03/11/16 1201 03/12/16 0509 03/13/16 0752 03/14/16 0757  NA 137 140  --  144  K 4.1 3.5  --  3.7  CL 104 108  --  115*  CO2 23 20*  --  18*  GLUCOSE 129* 101*  --  114*  BUN 30* 34*  --  22*  CREATININE 3.48* 4.14* 3.29* 2.09*  CALCIUM 8.3* 7.8*  --  8.7*    Liver Function Tests:  Recent Labs Lab 03/11/16 1201  AST 19  ALT 12*  ALKPHOS 245*  BILITOT 1.1  PROT 7.0  ALBUMIN 3.5   No results for input(s): LIPASE, AMYLASE in the last 168 hours. No results for input(s): AMMONIA in the last 168 hours.  CBC:  Recent Labs Lab 03/11/16 1201 03/12/16 0509  WBC 7.9 4.8  NEUTROABS 6.4  --   HGB 10.6* 9.6*  HCT 32.0* 29.8*  MCV 93.2 95.0  PLT 199 161    Cardiac Enzymes:  Recent Labs Lab 03/11/16 1201  TROPONINI <0.03    BNP: Invalid input(s): POCBNP  CBG:  Recent Labs Lab 03/13/16 0734  03/13/16 1136 03/13/16 1655 03/13/16 2133 03/14/16 0726  GLUCAP 119* 128* 106* 108* 121*    Microbiology: No results found for this or any previous visit.  Coagulation Studies: No results for input(s): LABPROT, INR in the last 72 hours.  Urinalysis:  Recent Labs  03/11/16 1244  COLORURINE YELLOW*  LABSPEC 1.009  PHURINE 5.0  GLUCOSEU NEGATIVE  HGBUR NEGATIVE  BILIRUBINUR NEGATIVE  KETONESUR TRACE*  PROTEINUR NEGATIVE  NITRITE NEGATIVE  LEUKOCYTESUR NEGATIVE      Imaging: No results found.   Medications:     . aspirin EC  325 mg Oral q morning - 10a  . atorvastatin  10 mg Oral q1800  . cloNIDine  0.3 mg Oral Daily  . DULoxetine  30 mg Oral Daily  . heparin  5,000 Units Subcutaneous Q8H  . insulin aspart  0-20 Units Subcutaneous TID WC  . metoprolol  50 mg Oral BID  . mometasone-formoterol  2 puff Inhalation BID  . pantoprazole  40 mg Oral Daily  . sodium chloride flush  3 mL Intravenous Q12H  . tamsulosin  0.4 mg Oral Daily   acetaminophen **OR** acetaminophen, meclizine, ondansetron **OR** ondansetron (ZOFRAN) IV, oxyCODONE, senna-docusate  Assessment/ Plan:  Michael Mathews is a 70 y.o. white  male with Osteoarthritis, asthma, COPD, diabetes mellitus type 2, hypertension, obstructive sleep apnea, who was admitted to [facility] on 03/11/2016 for evaluation of dizziness.    1. Acute renal failure: on chronic kidney disease stage III: baseline GFR of 54 from 2015. Acute renal failure most likely secondary to contrast exposure in the setting of possible volume depletion. Patient also on losartan at home. - holding losartan - discontinued metformin - Continue to monitor renal function  2. Hypertension: blood pressure at goal. Holding losartan - tamsulosin  3.  Anemia with kidney failure: hemoglobin 9.6. Not a candidate for epo with metastatic cancer  4. Diabetes Mellitus type II with chronic kidney disease: off metformin.   Follow up with Dr. Candiss Norse 10/19  at 11am.    LOS: 3 Shaneen Reeser 10/3/201710:50 AM

## 2016-03-23 ENCOUNTER — Emergency Department: Payer: Medicare Other

## 2016-03-23 ENCOUNTER — Inpatient Hospital Stay
Admission: EM | Admit: 2016-03-23 | Discharge: 2016-03-24 | DRG: 194 | Disposition: A | Discharge status: Home Health | Payer: Medicare Other | Attending: Internal Medicine | Admitting: Internal Medicine

## 2016-03-23 ENCOUNTER — Encounter: Payer: Self-pay | Admitting: Emergency Medicine

## 2016-03-23 DIAGNOSIS — Z8249 Family history of ischemic heart disease and other diseases of the circulatory system: Secondary | ICD-10-CM | POA: Diagnosis not present

## 2016-03-23 DIAGNOSIS — Z7984 Long term (current) use of oral hypoglycemic drugs: Secondary | ICD-10-CM | POA: Diagnosis not present

## 2016-03-23 DIAGNOSIS — R42 Dizziness and giddiness: Secondary | ICD-10-CM

## 2016-03-23 DIAGNOSIS — Z79899 Other long term (current) drug therapy: Secondary | ICD-10-CM | POA: Diagnosis not present

## 2016-03-23 DIAGNOSIS — G4730 Sleep apnea, unspecified: Secondary | ICD-10-CM | POA: Diagnosis present

## 2016-03-23 DIAGNOSIS — Z7982 Long term (current) use of aspirin: Secondary | ICD-10-CM | POA: Diagnosis not present

## 2016-03-23 DIAGNOSIS — Y95 Nosocomial condition: Secondary | ICD-10-CM | POA: Diagnosis present

## 2016-03-23 DIAGNOSIS — Z823 Family history of stroke: Secondary | ICD-10-CM | POA: Diagnosis not present

## 2016-03-23 DIAGNOSIS — R972 Elevated prostate specific antigen [PSA]: Secondary | ICD-10-CM | POA: Diagnosis present

## 2016-03-23 DIAGNOSIS — Z66 Do not resuscitate: Secondary | ICD-10-CM | POA: Diagnosis present

## 2016-03-23 DIAGNOSIS — J9610 Chronic respiratory failure, unspecified whether with hypoxia or hypercapnia: Secondary | ICD-10-CM | POA: Diagnosis present

## 2016-03-23 DIAGNOSIS — I951 Orthostatic hypotension: Secondary | ICD-10-CM | POA: Diagnosis present

## 2016-03-23 DIAGNOSIS — A419 Sepsis, unspecified organism: Secondary | ICD-10-CM

## 2016-03-23 DIAGNOSIS — I10 Essential (primary) hypertension: Secondary | ICD-10-CM | POA: Diagnosis present

## 2016-03-23 DIAGNOSIS — J189 Pneumonia, unspecified organism: Principal | ICD-10-CM | POA: Diagnosis present

## 2016-03-23 DIAGNOSIS — Z7951 Long term (current) use of inhaled steroids: Secondary | ICD-10-CM

## 2016-03-23 DIAGNOSIS — E119 Type 2 diabetes mellitus without complications: Secondary | ICD-10-CM | POA: Diagnosis present

## 2016-03-23 DIAGNOSIS — E860 Dehydration: Secondary | ICD-10-CM | POA: Diagnosis present

## 2016-03-23 DIAGNOSIS — R262 Difficulty in walking, not elsewhere classified: Secondary | ICD-10-CM

## 2016-03-23 DIAGNOSIS — J440 Chronic obstructive pulmonary disease with acute lower respiratory infection: Secondary | ICD-10-CM | POA: Diagnosis present

## 2016-03-23 DIAGNOSIS — F17210 Nicotine dependence, cigarettes, uncomplicated: Secondary | ICD-10-CM | POA: Diagnosis present

## 2016-03-23 DIAGNOSIS — N179 Acute kidney failure, unspecified: Secondary | ICD-10-CM | POA: Diagnosis present

## 2016-03-23 DIAGNOSIS — D638 Anemia in other chronic diseases classified elsewhere: Secondary | ICD-10-CM | POA: Diagnosis present

## 2016-03-23 HISTORY — DX: Elevated prostate specific antigen (PSA): R97.20

## 2016-03-23 LAB — BLOOD GAS, VENOUS
ACID-BASE EXCESS: 3.1 mmol/L — AB (ref 0.0–2.0)
BICARBONATE: 27 mmol/L (ref 20.0–28.0)
FIO2: 0.28
O2 SAT: 77.1 %
PCO2 VEN: 38 mmHg — AB (ref 44.0–60.0)
PH VEN: 7.46 — AB (ref 7.250–7.430)
PO2 VEN: 39 mmHg (ref 32.0–45.0)
Patient temperature: 37

## 2016-03-23 LAB — COMPREHENSIVE METABOLIC PANEL
ALBUMIN: 3.2 g/dL — AB (ref 3.5–5.0)
ALT: 16 U/L — ABNORMAL LOW (ref 17–63)
ANION GAP: 10 (ref 5–15)
AST: 34 U/L (ref 15–41)
Alkaline Phosphatase: 182 U/L — ABNORMAL HIGH (ref 38–126)
BILIRUBIN TOTAL: 1.2 mg/dL (ref 0.3–1.2)
BUN: 21 mg/dL — ABNORMAL HIGH (ref 6–20)
CALCIUM: 7.7 mg/dL — AB (ref 8.9–10.3)
CO2: 24 mmol/L (ref 22–32)
Chloride: 106 mmol/L (ref 101–111)
Creatinine, Ser: 1.8 mg/dL — ABNORMAL HIGH (ref 0.61–1.24)
GFR calc non Af Amer: 36 mL/min — ABNORMAL LOW (ref >60)
GFR, EST AFRICAN AMERICAN: 42 mL/min — AB (ref >60)
GLUCOSE: 111 mg/dL — AB (ref 65–99)
POTASSIUM: 4.1 mmol/L (ref 3.5–5.1)
SODIUM: 140 mmol/L (ref 135–145)
TOTAL PROTEIN: 6.7 g/dL (ref 6.5–8.1)

## 2016-03-23 LAB — CBC WITH DIFFERENTIAL/PLATELET
BASOS PCT: 0 %
Band Neutrophils: 0 %
Basophils Absolute: 0 10*3/uL (ref 0–0.1)
Blasts: 0 %
Eosinophils Absolute: 0.1 10*3/uL (ref 0–0.7)
Eosinophils Relative: 1 %
HEMATOCRIT: 26.8 % — AB (ref 40.0–52.0)
HEMOGLOBIN: 8.7 g/dL — AB (ref 13.0–18.0)
LYMPHS PCT: 20 %
Lymphs Abs: 1.2 10*3/uL (ref 1.0–3.6)
MCH: 30.9 pg (ref 26.0–34.0)
MCHC: 32.6 g/dL (ref 32.0–36.0)
MCV: 94.9 fL (ref 80.0–100.0)
MONO ABS: 0.5 10*3/uL (ref 0.2–1.0)
MYELOCYTES: 1 %
Metamyelocytes Relative: 1 %
Monocytes Relative: 9 %
NEUTROS PCT: 68 %
NRBC: 0 /100{WBCs}
Neutro Abs: 4.3 10*3/uL (ref 1.4–6.5)
OTHER: 0 %
PROMYELOCYTES ABS: 0 %
Platelets: 177 10*3/uL (ref 150–440)
RBC: 2.82 MIL/uL — AB (ref 4.40–5.90)
RDW: 16 % — AB (ref 11.5–14.5)
WBC: 6.1 10*3/uL (ref 3.8–10.6)

## 2016-03-23 LAB — TROPONIN I: Troponin I: 0.04 ng/mL (ref <0.03)

## 2016-03-23 LAB — LACTIC ACID, PLASMA
Lactic Acid, Venous: 3.4 mmol/L (ref 0.5–1.9)
Lactic Acid, Venous: 1.9 mmol/L (ref 0.5–1.9)

## 2016-03-23 MED ORDER — CLONIDINE HCL 0.1 MG PO TABS
0.1000 mg | ORAL_TABLET | Freq: Two times a day (BID) | ORAL | Status: DC
  Administered 2016-03-24 (×2): 0.1 mg via ORAL
  Filled 2016-03-23 (×2): qty 1

## 2016-03-23 MED ORDER — AZITHROMYCIN 250 MG PO TABS
250.0000 mg | ORAL_TABLET | Freq: Every day | ORAL | Status: DC
  Administered 2016-03-24: 250 mg via ORAL
  Filled 2016-03-23: qty 1

## 2016-03-23 MED ORDER — VANCOMYCIN HCL IN DEXTROSE 1-5 GM/200ML-% IV SOLN: 1000.0000 mg | Freq: Once | INTRAVENOUS | Status: DC

## 2016-03-23 MED ORDER — SODIUM CHLORIDE 0.9 % IV BOLUS (SEPSIS): 1000.0000 mL | Freq: Once | INTRAVENOUS | Status: DC

## 2016-03-23 MED ORDER — SODIUM CHLORIDE 0.9 % IV BOLUS (SEPSIS)
1000.0000 mL | Freq: Once | INTRAVENOUS | Status: AC
  Administered 2016-03-23: 1000 mL via INTRAVENOUS

## 2016-03-23 MED ORDER — MECLIZINE HCL 25 MG PO TABS
12.5000 mg | ORAL_TABLET | Freq: Two times a day (BID) | ORAL | Status: DC | PRN
Start: 2016-03-23 — End: 2016-03-24

## 2016-03-23 MED ORDER — ACETAMINOPHEN 325 MG PO TABS: 650.0000 mg | ORAL_TABLET | Freq: Four times a day (QID) | ORAL | Status: DC | PRN

## 2016-03-23 MED ORDER — ASPIRIN EC 325 MG PO TBEC
325.0000 mg | DELAYED_RELEASE_TABLET | ORAL | Status: DC
  Administered 2016-03-24: 325 mg via ORAL
  Filled 2016-03-23: qty 1

## 2016-03-23 MED ORDER — MOMETASONE FURO-FORMOTEROL FUM 200-5 MCG/ACT IN AERO
2.0000 | INHALATION_SPRAY | Freq: Two times a day (BID) | RESPIRATORY_TRACT | Status: DC
  Administered 2016-03-24 (×2): 2 via RESPIRATORY_TRACT
  Filled 2016-03-23: qty 8.8

## 2016-03-23 MED ORDER — ENOXAPARIN SODIUM 40 MG/0.4ML ~~LOC~~ SOLN
40.0000 mg | SUBCUTANEOUS | Status: DC
  Administered 2016-03-24: 40 mg via SUBCUTANEOUS
  Filled 2016-03-23: qty 0.4

## 2016-03-23 MED ORDER — SODIUM CHLORIDE 0.9 % IV BOLUS (SEPSIS)
500.0000 mL | Freq: Once | INTRAVENOUS | Status: AC
  Administered 2016-03-24: 500 mL via INTRAVENOUS

## 2016-03-23 MED ORDER — METOPROLOL TARTRATE 50 MG PO TABS
50.0000 mg | ORAL_TABLET | Freq: Two times a day (BID) | ORAL | Status: DC
  Administered 2016-03-24: 50 mg via ORAL
  Filled 2016-03-23 (×2): qty 1

## 2016-03-23 MED ORDER — DULOXETINE HCL 30 MG PO CPEP
30.0000 mg | ORAL_CAPSULE | Freq: Every day | ORAL | Status: DC
  Administered 2016-03-24: 30 mg via ORAL
  Filled 2016-03-23: qty 1

## 2016-03-23 MED ORDER — VANCOMYCIN HCL IN DEXTROSE 1-5 GM/200ML-% IV SOLN
1000.0000 mg | INTRAVENOUS | Status: DC
  Administered 2016-03-24: 1000 mg via INTRAVENOUS
  Filled 2016-03-23 (×2): qty 200

## 2016-03-23 MED ORDER — NICOTINE 14 MG/24HR TD PT24
14.0000 mg | MEDICATED_PATCH | Freq: Every day | TRANSDERMAL | Status: DC
  Administered 2016-03-24: 14 mg via TRANSDERMAL
  Filled 2016-03-23 (×2): qty 1

## 2016-03-23 MED ORDER — SODIUM CHLORIDE 0.9 % IV BOLUS (SEPSIS)
500.0000 mL | Freq: Once | INTRAVENOUS | Status: AC
  Administered 2016-03-23: 500 mL via INTRAVENOUS

## 2016-03-23 MED ORDER — CEFEPIME-DEXTROSE 2 GM/50ML IV SOLR
2.0000 g | Freq: Once | INTRAVENOUS | Status: AC
  Administered 2016-03-23: 2 g via INTRAVENOUS
  Filled 2016-03-23: qty 50

## 2016-03-23 MED ORDER — SODIUM CHLORIDE 0.9 % IV BOLUS (SEPSIS)
1000.0000 mL | Freq: Once | INTRAVENOUS | Status: AC
  Administered 2016-03-24: 1000 mL via INTRAVENOUS

## 2016-03-23 MED ORDER — PANTOPRAZOLE SODIUM 40 MG PO TBEC
40.0000 mg | DELAYED_RELEASE_TABLET | Freq: Every day | ORAL | Status: DC
  Administered 2016-03-24: 40 mg via ORAL
  Filled 2016-03-23: qty 1

## 2016-03-23 MED ORDER — VANCOMYCIN HCL IN DEXTROSE 1-5 GM/200ML-% IV SOLN
1000.0000 mg | INTRAVENOUS | Status: AC
  Administered 2016-03-24: 1000 mg via INTRAVENOUS
  Filled 2016-03-23: qty 200

## 2016-03-23 MED ORDER — PIPERACILLIN-TAZOBACTAM 3.375 G IVPB 30 MIN: 3.3750 g | Freq: Once | INTRAVENOUS | Status: DC

## 2016-03-23 MED ORDER — HYDRALAZINE HCL 50 MG PO TABS
50.0000 mg | ORAL_TABLET | Freq: Three times a day (TID) | ORAL | Status: DC
  Administered 2016-03-24 (×2): 50 mg via ORAL
  Filled 2016-03-23 (×2): qty 1

## 2016-03-23 MED ORDER — CEFEPIME-DEXTROSE 2 GM/50ML IV SOLR
2.0000 g | Freq: Two times a day (BID) | INTRAVENOUS | Status: DC
  Administered 2016-03-24: 2 g via INTRAVENOUS
  Filled 2016-03-23 (×2): qty 50

## 2016-03-23 MED ORDER — AZITHROMYCIN 250 MG PO TABS
500.0000 mg | ORAL_TABLET | Freq: Every day | ORAL | Status: AC
  Administered 2016-03-24: 500 mg via ORAL
  Filled 2016-03-23: qty 2

## 2016-03-23 MED ORDER — ACETAMINOPHEN 650 MG RE SUPP: 650.0000 mg | Freq: Four times a day (QID) | RECTAL | Status: DC | PRN

## 2016-03-23 MED ORDER — ATORVASTATIN CALCIUM 20 MG PO TABS
10.0000 mg | ORAL_TABLET | Freq: Every day | ORAL | Status: DC
  Administered 2016-03-24 (×2): 10 mg via ORAL
  Filled 2016-03-23: qty 2
  Filled 2016-03-23: qty 1

## 2016-03-23 MED ORDER — GLIPIZIDE ER 10 MG PO TB24
20.0000 mg | ORAL_TABLET | Freq: Every day | ORAL | Status: DC
  Administered 2016-03-24: 20 mg via ORAL
  Filled 2016-03-23: qty 2

## 2016-03-23 NOTE — Progress Notes (Signed)
Pt doesn't wish to wear our Cpap. Pt encouraged to call should he change his mind.

## 2016-03-23 NOTE — ED Notes (Signed)
Pt alert.  nsr on monitor.  Iv fluids infusing.  Skin warm and dry.  Pt unable to void at this time.  Pt waiting on admission.

## 2016-03-23 NOTE — ED Provider Notes (Addendum)
-----------------------------------------   7:22 PM on 03/23/2016 -----------------------------------------   Blood pressure (!) 149/80, pulse 83, temperature 98.4 F (36.9 C), temperature source Oral, resp. rate (!) 21, height 5\' 10"  (1.778 m), weight 245 lb (111.1 kg), SpO2 99 %.  Assuming care from Dr. Jimmye Norman.  In short, Michael Mathews is a 70 y.o. male with a chief complaint of Dizziness .  Refer to the original H&P for additional details.  The current plan of care is to *following the patient's laboratory and x-ray testing. Patient was initiated on a septic workup due to hypotension and some feelings of shortness of breath. Patient appears to have pneumonia and laboratory work showed an elevated lactic acid level. Patient was initiated on IV antibiotics and blood cultures urine and urine culture [facility] but obtained. Patient was also initiated on septic fluid bolus. His last blood pressure systolic was [id number]. Pulse ox remained stable on a 2 L nasal cannula.  Chest x-ray Bilateral basilar pneumonia.  EKG: ED ECG REPORT I, Daymon Larsen, the attending physician, personally viewed and interpreted this ECG.  Date: 03/23/2016 EKG Time: 1910 Rate: *82 Rhythm: normal sinus rhythm QRS Axis: normal Intervals: normal ST/T Wave abnormalities: normal Conduction Disturbances: none Narrative Interpretation: unremarkable Poor R-wave progression anteriorly leads Nonspecific intraventricular delay   Assessment Healthcare acquired pneumonia Possible sepsis    Plan: Inpatient management    Daymon Larsen, MD 03/23/16 1927

## 2016-03-23 NOTE — ED Notes (Signed)
Report called to floor nurse alvesha rn.  Pt alert.  nsor on monitor.

## 2016-03-23 NOTE — ED Triage Notes (Signed)
Pt via ems from home c/o dizziness x 3 weeks. States he didn't come in yesterday because he "wanted to stay home."  States he has been feeling increasingly sob. Pt had hypotension at scene per ems (80's/40's) ; EKG unremarkable, no fever. Pt states his urine has foul smell and is cloudy.

## 2016-03-23 NOTE — ED Notes (Signed)
Resumed care from [facility] rn.  Pt alert.  No chest pain.  No dizziness now.  Pt on 2 liters oxygen.  nsr on monitor.  Skin warm and dry.  Iv in place.  siderails up x 2.

## 2016-03-23 NOTE — ED Notes (Signed)
Transporting patient to room 224-2C

## 2016-03-23 NOTE — ED Provider Notes (Signed)
[facility] Emergency Department Provider Note        Time seen: ----------------------------------------- 2:42 PM on 03/23/2016 -----------------------------------------    I have reviewed the triage vital signs and the nursing notes.   HISTORY  Chief Complaint Dizziness    HPI DREVIN WILES is a 70 y.o. male who presents to the ER for dizziness. Patient describes a room spinning sensation like he has gotten off of a merry-go-round. He does have a history of chronic vertigoand he thinks he is mostly taking meclizine. He denies recent illness, but has noted that his urine smells abnormal and looks cloudy. In route his blood pressure was noted to be in the [id number] systolic. He denies fevers, chills, chest pain, vomiting or diarrhea. He has chronic shortness of breath.   Past Medical History:  Diagnosis Date  . Arthritis   . Asthma   . COPD (chronic obstructive pulmonary disease) ([place])   . Cough    chronic  . Diabetes mellitus without complication ([place])   . Edema   . Hypertension   . Orthopnea   . Oxygen deficit    o2 prn  . Shortness of breath dyspnea   . Sleep apnea    cpap    Patient Active Problem List   Diagnosis Date Noted  . ARF (acute renal failure) ([place]) 03/11/2016  . Dizziness 03/03/2016    Past Surgical History:  Procedure Laterality Date  . CARDIAC CATHETERIZATION    . COLONOSCOPY    . EYE SURGERY    . KNEE ARTHROSCOPY      Allergies Review of patient's allergies indicates no known allergies.  Social History Social History  Substance Use Topics  . Smoking status: Current Every Day Smoker    Packs/day: 0.50    Years: 55.00  . Smokeless tobacco: Current User  . Alcohol use No    Review of Systems Constitutional: Negative for fever. Cardiovascular: Negative for chest pain. Respiratory: Negative for shortness of breath. Gastrointestinal: Negative for abdominal pain, vomiting and diarrhea. Genitourinary: Negative for  dysuria. Positive for malodorous urine Musculoskeletal: Negative for back pain. Skin: Negative for rash. Neurological: Negative for headaches, focal weakness or numbness. Positive for dizziness  10-point ROS otherwise negative.  ____________________________________________   PHYSICAL EXAM:  VITAL SIGNS: ED Triage Vitals  Enc Vitals Group     BP      Pulse      Resp      Temp      Temp src      SpO2      Weight      Height      Head Circumference      Peak Flow      Pain Score      Pain Loc      Pain Edu?      Excl. in [place]?    Constitutional: Alert and oriented. Well appearing and in no distress. Eyes: Conjunctivae are normal. PERRL. Normal extraocular movements. ENT   Head: Normocephalic and atraumatic.   Nose: No congestion/rhinnorhea.   Mouth/Throat: Mucous membranes are moist.   Neck: No stridor. Cardiovascular: Normal rate, regular rhythm. No murmurs, rubs, or gallops. Respiratory: Normal respiratory effort without tachypnea nor retractions. Breath sounds are clear and equal bilaterally. No wheezes/rales/rhonchi. Gastrointestinal: Soft and nontender. Normal bowel sounds Musculoskeletal: Nontender with normal range of motion in all extremities. No lower extremity tenderness nor edema. Neurologic:  Normal speech and language. No gross focal neurologic deficits are appreciated.  Skin:  Skin  is warm, dry and intact. No rash noted. Psychiatric: Mood and affect are normal. Speech and behavior are normal.  ____________________________________________  ED COURSE:  Pertinent labs & imaging results that were available during my care of the patient were reviewed by me and considered in my medical decision making (see chart for details). Clinical Course  Patient presents to ER with dizziness which is likely vertigo related. Unclear etiology for his hypotension. On arrival here he is normotensive. We will assess with basic labs and  imaging.  Procedures ____________________________________________   LABS (pertinent positives/negatives)  Labs Reviewed  CULTURE, BLOOD (ROUTINE X 2)  CULTURE, BLOOD (ROUTINE X 2)  URINE CULTURE  LACTIC ACID, PLASMA  LACTIC ACID, PLASMA  COMPREHENSIVE METABOLIC PANEL  TROPONIN I  CBC WITH DIFFERENTIAL/PLATELET  BLOOD GAS, VENOUS  URINALYSIS COMPLETEWITH MICROSCOPIC ([facility] ONLY)    RADIOLOGY Images were viewed by me  Chest x-ray Is pending at this time ____________________________________________  FINAL ASSESSMENT AND PLAN  Dizziness  Plan: Patient with labs and imaging that are pending at this time. Patient care checked out to [place].   Earleen Newport, MD   Note: This dictation was prepared with Dragon dictation. Any transcriptional errors that result from this process are unintentional    Earleen Newport, MD 03/23/16 [phone]

## 2016-03-23 NOTE — ED Notes (Signed)
Lab called with troponin of 0.04  Dr Marcelene Butte aware.

## 2016-03-23 NOTE — Progress Notes (Signed)
Pharmacy Antibiotic Note  Michael Mathews is a 70 y.o. male admitted on 03/23/2016 with pneumonia/?HCAP.  Pharmacy has been consulted for Vancomycin and Cefepime dosing.  CXR= Bilat. LL PNA Patient w/ recent admissions 9/22-9/24/17 and 9/30-10/3/17 for dizziness, etc. Patient from home.   Plan: Vancomycin 1000 IV every 18 hours.  Goal trough 15-20 mcg/mL. Will give stacked dosing.  Ke= 0.037  T1/2 18.73   Vd 61.74   Will schedule trough prior to 5th dose.  Cefepime 2gm IV q12h per renal fxn.  Patient also ordered Azithromycin.  Height: 5\' 10"  (177.8 cm) Weight: 245 lb (111.1 kg) IBW/kg (Calculated) : 73  Adj BW= 88.2 kg  Temp (24hrs), Avg:98.4 F (36.9 C), Min:98.4 F (36.9 C), Max:98.4 F (36.9 C)   Recent Labs Lab 03/23/16 1543 03/23/16 1735  WBC 6.1  --   CREATININE 1.80*  --   LATICACIDVEN 3.4* 1.9    Estimated Creatinine Clearance: 47 mL/min (by C-G formula based on SCr of 1.8 mg/dL (H)).    No Known Allergies  Antimicrobials this admission: Cefepime 10/12 >>   Vanc  10/12 >>   Azithromycin 10/12 >>  Dose adjustments this admission:    Microbiology results: 10/12 BCx: P 10/12 UCx: P    Sputum:    10/12 MRSA PCR: P  Thank you for allowing pharmacy to be a part of this patient's care.  Flynn Gwyn A 03/23/2016 8:47 PM

## 2016-03-23 NOTE — H&P (Signed)
[facility] [place] NAME: Michael Mathews    MR#:  [id number]  DATE OF BIRTH:  June 20, 1945  DATE OF ADMISSION:  03/23/2016  PRIMARY CARE PHYSICIAN: [name], MD   REQUESTING/REFERRING PHYSICIAN: DR Meade Maw  CHIEF COMPLAINT:   Chief Complaint  Patient presents with  . Dizziness    HISTORY OF PRESENT ILLNESS:  Michael Mathews  is a 70 y.o. male presenting back to the hospital with not feeling well. He was recently discharged from the hospital with dizziness and passing out. He states that he just felt really weak and was lying down today and then he couldn't take it anymore and came back for further evaluation. He feels lightheaded and unsteady when he walks. Some cough and some shortness of breath. In the ER he was found to have bilateral pneumonia on chest x-ray and hospitalist services were contacted for further evaluation  PAST MEDICAL HISTORY:   Past Medical History:  Diagnosis Date  . Arthritis   . Asthma   . COPD (chronic obstructive pulmonary disease) ([place])   . Cough    chronic  . Diabetes mellitus without complication ([place])   . Edema   . Elevated PSA   . Hypertension   . Orthopnea   . Oxygen deficit    o2 prn  . Shortness of breath dyspnea   . Sleep apnea    cpap    PAST SURGICAL HISTORY:   Past Surgical History:  Procedure Laterality Date  . CARDIAC CATHETERIZATION    . COLONOSCOPY    . EYE SURGERY    . KNEE ARTHROSCOPY      SOCIAL HISTORY:   Social History  Substance Use Topics  . Smoking status: Current Every Day Smoker    Packs/day: 0.50    Years: 55.00  . Smokeless tobacco: Current User  . Alcohol use No    FAMILY HISTORY:   Family History  Problem Relation Age of Onset  . CVA Mother   . CAD Father     DRUG ALLERGIES:  No Known Allergies  REVIEW OF SYSTEMS:  CONSTITUTIONAL: No fever. Felt warm last night. Positive for sweats. Positive for fatigue. EYES: No blurred or  double vision.  EARS, NOSE, AND THROAT: No tinnitus or ear pain. No sore throat. Positive for runny nose RESPIRATORY: Some cough, shortness of breath and wheezing. No hemoptysis.  CARDIOVASCULAR: Occasional chest pain. No orthopnea, edema.  GASTROINTESTINAL: Positive for nausea. No vomiting, diarrhea. No blood in bowel movements. Some abdominal pain GENITOURINARY: No dysuria, hematuria.  ENDOCRINE: No polyuria, nocturia,  HEMATOLOGY: No anemia, easy bruising or bleeding SKIN: No rash or lesion. MUSCULOSKELETAL: No joint pain or arthritis.   NEUROLOGIC: No tingling, numbness, weakness.  PSYCHIATRY: No anxiety or depression.   MEDICATIONS AT HOME:   Prior to Admission medications   Medication Sig Start Date End Date Taking? Authorizing Provider  aspirin EC 325 MG tablet Take 325 mg by mouth every morning.   Yes Historical Provider, MD  atorvastatin (LIPITOR) 10 MG tablet Take 10 mg by mouth daily.   Yes Historical Provider, MD  cloNIDine (CATAPRES) 0.3 MG tablet Take 0.3 mg by mouth daily.   Yes Historical Provider, MD  DULoxetine (CYMBALTA) 30 MG capsule Take 30 mg by mouth daily. 01/04/16  Yes Historical Provider, MD  Fluticasone-Salmeterol (ADVAIR) 250-50 MCG/DOSE AEPB Inhale 1 puff into the lungs 2 (two) times daily.   Yes Historical Provider, MD  GLIPIZIDE XL 10 MG 24 hr  tablet Take 20 mg by mouth daily. 01/19/16  Yes Historical Provider, MD  hydrALAZINE (APRESOLINE) 100 MG tablet Take 100 mg by mouth 3 (three) times daily.   Yes Historical Provider, MD  meclizine (ANTIVERT) 12.5 MG tablet Take 1 tablet (12.5 mg total) by mouth 2 (two) times daily as needed for dizziness. 03/05/16  Yes Dustin Flock, MD  metoprolol (LOPRESSOR) 50 MG tablet Take 50 mg by mouth 2 (two) times daily.   Yes Historical Provider, MD  naproxen (NAPROSYN) 500 MG tablet Take 500 mg by mouth 2 (two) times daily with a meal. 03/21/16  Yes Historical Provider, MD  pantoprazole (PROTONIX) 40 MG tablet Take 1 tablet (40  mg total) by mouth daily. 03/05/16  Yes Dustin Flock, MD  tamsulosin (FLOMAX) 0.4 MG CAPS capsule Take 1 capsule (0.4 mg total) by mouth daily. 03/05/16  Yes Dustin Flock, MD      VITAL SIGNS:  Blood pressure (!) 149/80, pulse 83, temperature 98.4 F (36.9 C), temperature source Oral, resp. rate (!) 21, height 5\' 10"  (1.778 m), weight 111.1 kg (245 lb), SpO2 99 %.  PHYSICAL EXAMINATION:  GENERAL:  70 y.o.-year-old patient lying in the bed with no acute distress.  EYES: Pupils equal, round, reactive to light and accommodation. No scleral icterus. Extraocular muscles intact.  HEENT: Head atraumatic, normocephalic. Oropharynx and nasopharynx clear.  NECK:  Supple, no jugular venous distention. No thyroid enlargement, no tenderness.  LUNGS: Normal breath sounds bilaterally, no wheezing, rales,rhonchi or crepitation. No use of accessory muscles of respiration.  CARDIOVASCULAR: S1, S2 normal. No murmurs, rubs, or gallops.  ABDOMEN: Soft, nontender, nondistended. Bowel sounds present. No organomegaly or mass.  EXTREMITIES: No pedal edema, cyanosis, or clubbing.  NEUROLOGIC: Cranial nerves II through XII are intact. Muscle strength 5/5 in all extremities. Sensation intact. Gait not checked.  PSYCHIATRIC: The patient is alert and oriented x 3.  SKIN: No rash, lesion, or ulcer.   LABORATORY PANEL:   CBC  Recent Labs Lab 03/23/16 1543  WBC 6.1  HGB 8.7*  HCT 26.8*  PLT 177   ------------------------------------------------------------------------------------------------------------------  Chemistries   Recent Labs Lab 03/23/16 1543  NA 140  K 4.1  CL 106  CO2 24  GLUCOSE 111*  BUN 21*  CREATININE 1.80*  CALCIUM 7.7*  AST 34  ALT 16*  ALKPHOS 182*  BILITOT 1.2   ------------------------------------------------------------------------------------------------------------------  Cardiac Enzymes  Recent Labs Lab 03/23/16 1543  TROPONINI 0.04*    ------------------------------------------------------------------------------------------------------------------  RADIOLOGY:  Dg Chest Port 1 View  Result Date: 03/23/2016 CLINICAL DATA:  Dizziness over the last 3 weeks. Worsening shortness of breath. EXAM: PORTABLE CHEST 1 VIEW COMPARISON:  03/11/2016 FINDINGS: Heart size is normal. There is aortic atherosclerosis. There is patchy infiltrate at both lung bases left worse than right consistent pneumonia. Upper lungs are clear. No heart failure or effusion. IMPRESSION: Bilateral lower lobe pneumonia left worse than right. No dense consolidation or lobar collapse. Electronically Signed   By: Nelson Chimes M.D.   On: 03/23/2016 15:04     IMPRESSION AND PLAN:   1. Bilateral pneumonia. Patient actually doesn't appear that sick. Since he was recently in the hospital need to get aggressive with antibiotics with vancomycin and cefepime and Zithromax. I will get a MRSA PCR so hopefully we can get rid of the vancomycin. 2. Dizziness. Check orthostatic vital signs. Stop Flomax. Decrease clonidine to 0.1 mg twice a day. Cut back on hydralazine dose. 3. Chronic respiratory failure and COPD continue inhalers and oxygen supplementation  4. Sleep apnea on CPAP at night 5. Recent acute kidney injury. Likely has underlying chronic kidney disease. Monitor creatinine again tomorrow. Discontinue Naprosyn 6. Elevated PSA was referred to urology as outpatient 7. Type 2 diabetes mellitus. Sliding scale ordered 8. Anemia. Send off iron studies and guaiac stools.  All the records are reviewed and case discussed with ED provider. Management plans discussed with the patient, and he is in agreement.  CODE STATUS: DO NOT RESUSCITATE  TOTAL TIME TAKING CARE OF THIS PATIENT: 50 minutes.    Loletha Grayer M.D on 03/23/2016 at 8:23 PM  Between 7am to 6pm - Pager - [phone]  After 6pm call admission pager [phone]  [facility]   [phone]  CC: Primary care physician; [name], MD

## 2016-03-23 NOTE — ED Notes (Signed)
Lab called with lactic of 3.4   Dr Marcelene Butte aware.

## 2016-03-24 LAB — CBC
HCT: 26 % — ABNORMAL LOW (ref 40.0–52.0)
HEMOGLOBIN: 8.5 g/dL — AB (ref 13.0–18.0)
MCH: 31.1 pg (ref 26.0–34.0)
MCHC: 32.8 g/dL (ref 32.0–36.0)
MCV: 94.9 fL (ref 80.0–100.0)
Platelets: 152 10*3/uL (ref 150–440)
RBC: 2.74 MIL/uL — AB (ref 4.40–5.90)
RDW: 15.7 % — ABNORMAL HIGH (ref 11.5–14.5)
WBC: 5.1 10*3/uL (ref 3.8–10.6)

## 2016-03-24 LAB — URINALYSIS COMPLETE WITH MICROSCOPIC (ARMC ONLY)
BILIRUBIN URINE: NEGATIVE
Glucose, UA: NEGATIVE mg/dL
KETONES UR: NEGATIVE mg/dL
LEUKOCYTES UA: NEGATIVE
Nitrite: NEGATIVE
PH: 6 (ref 5.0–8.0)
PROTEIN: 30 mg/dL — AB
SPECIFIC GRAVITY, URINE: 1.02 (ref 1.005–1.030)

## 2016-03-24 LAB — IRON AND TIBC
Iron: 42 ug/dL — ABNORMAL LOW (ref 45–182)
SATURATION RATIOS: 17 % — AB (ref 17.9–39.5)
TIBC: 246 ug/dL — AB (ref 250–450)
UIBC: 204 ug/dL

## 2016-03-24 LAB — GLUCOSE, CAPILLARY
Glucose-Capillary: 146 mg/dL — ABNORMAL HIGH (ref 65–99)
Glucose-Capillary: 145 mg/dL — ABNORMAL HIGH (ref 65–99)

## 2016-03-24 LAB — BASIC METABOLIC PANEL
ANION GAP: 7 (ref 5–15)
BUN: 23 mg/dL — ABNORMAL HIGH (ref 6–20)
CALCIUM: 7.6 mg/dL — AB (ref 8.9–10.3)
CO2: 27 mmol/L (ref 22–32)
Chloride: 108 mmol/L (ref 101–111)
Creatinine, Ser: 1.61 mg/dL — ABNORMAL HIGH (ref 0.61–1.24)
GFR, EST AFRICAN AMERICAN: 48 mL/min — AB (ref >60)
GFR, EST NON AFRICAN AMERICAN: 41 mL/min — AB (ref >60)
Glucose, Bld: 130 mg/dL — ABNORMAL HIGH (ref 65–99)
Potassium: 3.4 mmol/L — ABNORMAL LOW (ref 3.5–5.1)
Sodium: 142 mmol/L (ref 135–145)

## 2016-03-24 LAB — VITAMIN B12: VITAMIN B 12: 206 pg/mL (ref 180–914)

## 2016-03-24 LAB — FERRITIN: Ferritin: 51 ng/mL (ref 24–336)

## 2016-03-24 LAB — MRSA PCR SCREENING: MRSA BY PCR: NEGATIVE

## 2016-03-24 MED ORDER — LEVOFLOXACIN 500 MG PO TABS: 750.0000 mg | ORAL_TABLET | Freq: Every day | ORAL | Status: DC

## 2016-03-24 MED ORDER — FLUDROCORTISONE ACETATE 0.1 MG PO TABS: 0.1000 mg | ORAL_TABLET | Freq: Every day | ORAL | 0 refills | Status: DC

## 2016-03-24 MED ORDER — NICOTINE 14 MG/24HR TD PT24: 14.0000 mg | MEDICATED_PATCH | Freq: Every day | TRANSDERMAL | 0 refills | Status: DC

## 2016-03-24 MED ORDER — ATORVASTATIN CALCIUM 40 MG PO TABS: 40.0000 mg | ORAL_TABLET | Freq: Every day | ORAL | 0 refills | Status: DC

## 2016-03-24 MED ORDER — VITAMIN B-12 100 MCG PO TABS: 100.0000 ug | ORAL_TABLET | Freq: Every day | ORAL | 0 refills | Status: DC

## 2016-03-24 MED ORDER — CLONIDINE HCL 0.1 MG PO TABS: 0.1000 mg | ORAL_TABLET | Freq: Two times a day (BID) | ORAL | Status: DC

## 2016-03-24 MED ORDER — CLONIDINE HCL 0.1 MG PO TABS: 0.1000 mg | ORAL_TABLET | Freq: Two times a day (BID) | ORAL | 11 refills | Status: DC

## 2016-03-24 MED ORDER — AMLODIPINE BESYLATE 10 MG PO TABS: 10.0000 mg | ORAL_TABLET | Freq: Every day | ORAL | Status: DC

## 2016-03-24 MED ORDER — INSULIN ASPART 100 UNIT/ML ~~LOC~~ SOLN: 0.0000 [IU] | Freq: Every day | SUBCUTANEOUS | Status: DC

## 2016-03-24 MED ORDER — LEVOFLOXACIN 750 MG PO TABS: 750.0000 mg | ORAL_TABLET | Freq: Every day | ORAL | 0 refills | Status: DC

## 2016-03-24 MED ORDER — FLUDROCORTISONE ACETATE 0.1 MG PO TABS
0.1000 mg | ORAL_TABLET | Freq: Every day | ORAL | Status: DC
  Administered 2016-03-24: 0.1 mg via ORAL
  Filled 2016-03-24: qty 1

## 2016-03-24 MED ORDER — INSULIN ASPART 100 UNIT/ML ~~LOC~~ SOLN
0.0000 [IU] | Freq: Three times a day (TID) | SUBCUTANEOUS | Status: DC
  Administered 2016-03-24: 3 [IU] via SUBCUTANEOUS
  Filled 2016-03-24: qty 3

## 2016-03-24 NOTE — Progress Notes (Signed)
Dr. Ara Kussmaul made aware of positive orthostatic BP, also reviewed labs (elevated troponin). Pt continues to report dizziness upon standing. Safety precautions in place. Will monitor.

## 2016-03-24 NOTE — Care Management (Signed)
Patient admitted for PNA and dizziness.  Patient lives at home alone.  Patient states that he does not have any family that lives locally.  Per patient at baseline he is independent.  Patient PCP [facility], and uses their pharmacy. Denies any issues obtaining medications.  PT has assessed patient and recommended home health PT.  RW was provided to patient previous admission. Patient has [facility] O2 [facility].  Patient states that he only wears O2 on occasion.  Patient was able to maintain his O2 on RA with ambulation prior to discharge.  Home health order was placed for RN and PT.  Patient was provided with agency preference.  Patient states that he does not have a preference.  I have made the referral to [facility] with [facility].. Patient states that he has a smart phone at home, but has trouble using it.  Cheryl notified of this.  Start of care for tomorrow.  RNCM signing off.

## 2016-03-24 NOTE — Evaluation (Signed)
Physical Therapy Evaluation Patient Details Name: Michael Mathews MRN: [id number] DOB: 05-23-1946 Today's Date: 03/24/2016   History of Present Illness  Pt is a 70 y/o M presenting back from the hospital after being recently admitted and d/c due to episodes of dizziness.  Pt has continued to feel weak and lightheaded when walking.  Pt was found to have Bil pneumonia and +orthostatics during this hospital stay.  Pt's PMH includes COPD, eye surgery,knee arthroscopy.    Clinical Impression  Pt admitted with above diagnosis. Pt currently with functional limitations due to the deficits listed below (see PT Problem List). Michael Mathews has presented with +orthostatics this hospital stay but denies dizziness with postural changes during session.  BP 123/64 sitting EOB at start of PT session.  Pt has h/o of what is described as claudication with onset of BLE pain after >200 ft ambulating.  He is much more steady when using a RW which he has at home.  However, given his limited ambulatory distance before onset of pain, recommending rollator to allow for seated breaks. Additionally, recommend that pt obtain life alert due to recent h/o falls and +orthostatics. Pt will benefit from skilled PT to increase their independence and safety with mobility to allow discharge to the venue listed below.      Follow Up Recommendations Home health PT    Equipment Recommendations  Other (comment) (Rollator, life alert)    Recommendations for Other Services       Precautions / Restrictions Precautions Precautions: Fall Restrictions Weight Bearing Restrictions: No      Mobility  Bed Mobility               General bed mobility comments: Pt sitting EOB upon PT arrival  Transfers Overall transfer level: Needs assistance Equipment used: None Transfers: Sit to/from Stand Sit to Stand: Supervision         General transfer comment: No instability with sit<>stand, supervision provided as pt presented to  hospital with +orthostatics.  Pt denied dizziness/lightheadedness but reported BLE fatigue with static standing.  Attempted to take BP but machine not operating correctly.  Ambulation/Gait Ambulation/Gait assistance: Supervision Ambulation Distance (Feet): 300 Feet Assistive device: Rolling walker (2 wheeled) Gait Pattern/deviations: Step-through pattern;Decreased stride length   Gait velocity interpretation: at or above normal speed for age/gender General Gait Details: Pt steady but reports BLE pain at end of ambulation.  SpO2 remained at or above 92% on RA while ambulating.    Stairs            Wheelchair Mobility    Modified Rankin (Stroke Patients Only)       Balance Overall balance assessment: Needs assistance;History of Falls Sitting-balance support: No upper extremity supported;Feet supported Sitting balance-Leahy Scale: Normal     Standing balance support: No upper extremity supported;During functional activity Standing balance-Leahy Scale: Good                   Standardized Balance Assessment Standardized Balance Assessment : Dynamic Gait Index   Dynamic Gait Index Level Surface: Mild Impairment (more steady with RW) Change in Gait Speed: Normal Gait with Horizontal Head Turns: Normal Gait with Vertical Head Turns: Normal Gait and Pivot Turn: Normal Step Over Obstacle: Normal Step Around Obstacles: Normal       Pertinent Vitals/Pain Pain Assessment: 0-10 Pain Score: 5  Pain Location: BLEs at end of ambulation Pain Descriptors / Indicators: Aching;Cramping Pain Intervention(s): Limited activity within patient's tolerance;Monitored during session    Home Living Family/patient  expects to be discharged to:: Private residence Living Arrangements: Alone Available Help at Discharge: Neighbor Type of Home: Mobile home Home Access: Stairs to enter Entrance Stairs-Rails: Left Entrance Stairs-Number of Steps: 6 Home Layout: One level Home  Equipment: Walker - 2 wheels      Prior Function Level of Independence: Independent with assistive device(s)         Comments: Limited ambulatory distance due to claudication.  Does not use RW.  Does his own cooking, cleaning.       Hand Dominance   Dominant Hand: Right    Extremity/Trunk Assessment   Upper Extremity Assessment: Overall WFL for tasks assessed           Lower Extremity Assessment: Overall WFL for tasks assessed         Communication   Communication: No difficulties  Cognition Arousal/Alertness: Awake/alert Behavior During Therapy: WFL for tasks assessed/performed Overall Cognitive Status: Within Functional Limits for tasks assessed                      General Comments General comments (skin integrity, edema, etc.): Educated pt on orthostatic vital signs during this admission and instructed pt to move through postural progressions slowly, waiting ~1 minute or until dizziness/lightheadedness subside before moving to next postural change.  Pt verbalized understanding and was appreciative.    Exercises General Exercises - Lower Extremity Long Arc Quad: AROM;Both;10 reps;Seated Hip Flexion/Marching: Both;10 reps;Seated   Assessment/Plan    PT Assessment Patient needs continued PT services  PT Problem List Decreased activity tolerance;Decreased balance;Decreased knowledge of use of DME;Decreased safety awareness;Pain          PT Treatment Interventions DME instruction;Gait training;Stair training;Functional mobility training;Therapeutic activities;Therapeutic exercise;Balance training;Neuromuscular re-education;Patient/family education    PT Goals (Current goals can be found in the Care Plan section)  Acute Rehab PT Goals Patient Stated Goal: To be able to walk more PT Goal Formulation: With patient Time For Goal Achievement: 03/31/16 Potential to Achieve Goals: Good    Frequency Min 2X/week   Barriers to discharge         Co-evaluation               End of Session Equipment Utilized During Treatment: Gait belt Activity Tolerance: Patient tolerated treatment well;Patient limited by pain Patient left: in bed;with call bell/phone within reach;with bed alarm set (sitting EOB) Nurse Communication: Mobility status;Other (comment) (SpO2)         Time: [id number] PT Time Calculation (min) (ACUTE ONLY): 25 min   Charges:   PT Evaluation $PT Eval Low Complexity: 1 Procedure PT Treatments $Gait Training: 8-22 mins   PT G Codes:        Collie Siad PT, DPT 03/24/2016, 11:18 AM

## 2016-03-24 NOTE — Discharge Instructions (Signed)
Follow all MD discharge instructions. Take all medications as prescribed. Keep all follow up appointments. If your symptoms return, call your doctor. If you experience any new symptoms that are of concern to you or that are bothersome to you, call your doctor. For all questions and/or concerns, call your doctor. ° ° If you have a medical emergency, call 911 ° ° ° °

## 2016-03-24 NOTE — Discharge Summary (Signed)
[facility] NAME: Michael Mathews    MR#:  [id number]  DATE OF BIRTH:  [birth year]  DATE OF ADMISSION:  03/23/2016 ADMITTING PHYSICIAN: Loletha Grayer, MD  DATE OF DISCHARGE: 03/24/2016  PRIMARY CARE PHYSICIAN: Tomasa Hose A, MD    ADMISSION DIAGNOSIS:  Dizziness [R42] Healthcare-associated pneumonia [J18.9] Sepsis, due to unspecified organism ([place]) [A41.9]  DISCHARGE DIAGNOSIS:  Active Problems:   Pneumonia   SECONDARY DIAGNOSIS:   Past Medical History:  Diagnosis Date  . Arthritis   . Asthma   . COPD (chronic obstructive pulmonary disease) ([place])   . Cough    chronic  . Diabetes mellitus without complication ([place])   . Edema   . Elevated PSA   . Hypertension   . Orthopnea   . Oxygen deficit    o2 prn  . Shortness of breath dyspnea   . Sleep apnea    cpap    HOSPITAL COURSE:   70 year old male with a history of diabetes and essential hypertension who presents with dizziness and thought of bilateral pneumonia.  1. Bilateral pneumonia (HCAP): Clinically patient had no symptoms of pneumonia such as shortness of breath, cough, fevers or elevated white blood cell count. Chest x-ray did allude to bilateral pneumonia. He was empirically treated with cefepime and vancomycin along with the Zithromax. MRSA PCR came back negative. He will be discharged with Levaquin for 4 more days of treatment.  2. Dizziness with orthostasis: Hydralazine is most likely the etiology of the orthostatic hypotension. This medication has been discontinued. I spoke with nephrology who had recommended decreasing clonidine to 0.1 twice a day and using Florinef. I will also discharge patient with compression stockings. Patient has had workup for dizziness in the past including MRI of the brain which was negative, 2-D echocardiogram and carotid Dopplers. He has significant atherosclerosis as evident by last CT scan. Ideally he would benefit from cardiac  diet, good blood pressure and good diabetes control.  3. Essential hypertension: Due to the significant atherosclerotic disease it is recommended to keep his blood pressure systolic in the [id number] range. He will continue on low-dose clonidine, metoprolol.   4. Diabetes: Continue outpatient medications. 5. Acute kidney injury: This is due to dehydration. This is improved with IV fluids. Naproxen has been discontinued   6. Elevated PSA in the past: Patient has been referred to urology as outpatient. 7. Anemia of chronic disease: Hemoglobin is relatively stable. No evidence of acute GI bleed. B12 was low however within normal limits. He would benefit from a B12 supplement.     DISCHARGE CONDITIONS AND DIET:  Stable Diabetic diet  CONSULTS OBTAINED:    DRUG ALLERGIES:  No Known Allergies  DISCHARGE MEDICATIONS:   Current Discharge Medication List    START taking these medications   Details  fludrocortisone (FLORINEF) 0.1 MG tablet Take 1 tablet (0.1 mg total) by mouth daily. Qty: 30 tablet, Refills: 0    levofloxacin (LEVAQUIN) 750 MG tablet Take 1 tablet (750 mg total) by mouth daily. Qty: 5 tablet, Refills: 0    nicotine (NICODERM CQ - DOSED IN MG/24 HOURS) 14 mg/24hr patch Place 1 patch (14 mg total) onto the skin daily. Qty: 28 patch, Refills: 0    vitamin B-12 (CYANOCOBALAMIN) 100 MCG tablet Take 1 tablet (100 mcg total) by mouth daily. Qty: 30 tablet, Refills: 0      CONTINUE these medications which have CHANGED   Details  atorvastatin (LIPITOR) 40 MG tablet Take  1 tablet (40 mg total) by mouth daily. Qty: 30 tablet, Refills: 0    cloNIDine (CATAPRES) 0.1 MG tablet Take 1 tablet (0.1 mg total) by mouth 2 (two) times daily. Qty: 60 tablet, Refills: 11      CONTINUE these medications which have NOT CHANGED   Details  aspirin EC 325 MG tablet Take 325 mg by mouth every morning.    DULoxetine (CYMBALTA) 30 MG capsule Take 30 mg by mouth daily.     Fluticasone-Salmeterol (ADVAIR) 250-50 MCG/DOSE AEPB Inhale 1 puff into the lungs 2 (two) times daily.    GLIPIZIDE XL 10 MG 24 hr tablet Take 20 mg by mouth daily.    meclizine (ANTIVERT) 12.5 MG tablet Take 1 tablet (12.5 mg total) by mouth 2 (two) times daily as needed for dizziness. Qty: 30 tablet, Refills: 0    metoprolol (LOPRESSOR) 50 MG tablet Take 50 mg by mouth 2 (two) times daily.    pantoprazole (PROTONIX) 40 MG tablet Take 1 tablet (40 mg total) by mouth daily. Qty: 30 tablet, Refills: 0    tamsulosin (FLOMAX) 0.4 MG CAPS capsule Take 1 capsule (0.4 mg total) by mouth daily. Qty: 30 capsule, Refills: 0      STOP taking these medications     hydrALAZINE (APRESOLINE) 100 MG tablet      naproxen (NAPROSYN) 500 MG tablet               Today   CHIEF COMPLAINT:  Doing well this am no couhg or fevers no dizziness or chest pain   VITAL SIGNS:  Blood pressure (!) 146/54, pulse 71, temperature 98.4 F (36.9 C), temperature source Oral, resp. rate 18, height 5\' 10"  (1.778 m), weight 111.1 kg (245 lb), SpO2 94 %.   REVIEW OF SYSTEMS:  Review of Systems  Constitutional: Negative.  Negative for chills, fever and malaise/fatigue.  HENT: Negative.  Negative for ear discharge, ear pain, hearing loss, nosebleeds and sore throat.   Eyes: Negative.  Negative for blurred vision and pain.  Respiratory: Negative.  Negative for cough, hemoptysis, shortness of breath and wheezing.   Cardiovascular: Negative.  Negative for chest pain, palpitations and leg swelling.  Gastrointestinal: Negative.  Negative for abdominal pain, blood in stool, diarrhea, nausea and vomiting.  Genitourinary: Negative.  Negative for dysuria.  Musculoskeletal: Negative.  Negative for back pain.  Skin: Negative.   Neurological: Negative for dizziness, tremors, speech change, focal weakness, seizures and headaches.  Endo/Heme/Allergies: Negative.  Does not bruise/bleed easily.   Psychiatric/Behavioral: Negative.  Negative for depression, hallucinations and suicidal ideas.     PHYSICAL EXAMINATION:  GENERAL:  70 y.o.-year-old patient lying in the bed with no acute distress.  NECK:  Supple, no jugular venous distention. No thyroid enlargement, no tenderness.  LUNGS: Normal breath sounds bilaterally, no wheezing, rales,rhonchi  No use of accessory muscles of respiration.  CARDIOVASCULAR: S1, S2 normal. No murmurs, rubs, or gallops.  ABDOMEN: Soft, non-tender, non-distended. Bowel sounds present. No organomegaly or mass.  EXTREMITIES: No pedal edema, cyanosis, or clubbing.  PSYCHIATRIC: The patient is alert and oriented x 3.  SKIN: No obvious rash, lesion, or ulcer.   DATA REVIEW:   CBC  Recent Labs Lab 03/24/16 0007  WBC 5.1  HGB 8.5*  HCT 26.0*  PLT 152    Chemistries   Recent Labs Lab 03/23/16 1543 03/24/16 0007  NA 140 142  K 4.1 3.4*  CL 106 108  CO2 24 27  GLUCOSE 111* 130*  BUN 21*  23*  CREATININE 1.80* 1.61*  CALCIUM 7.7* 7.6*  AST 34  --   ALT 16*  --   ALKPHOS 182*  --   BILITOT 1.2  --     Cardiac Enzymes  Recent Labs Lab 03/23/16 1543  TROPONINI 0.04*    Microbiology Results  @MICRORSLT48 @  RADIOLOGY:  Dg Chest Port 1 View  Result Date: 03/23/2016 CLINICAL DATA:  Dizziness over the last 3 weeks. Worsening shortness of breath. EXAM: PORTABLE CHEST 1 VIEW COMPARISON:  03/11/2016 FINDINGS: Heart size is normal. There is aortic atherosclerosis. There is patchy infiltrate at both lung bases left worse than right consistent pneumonia. Upper lungs are clear. No heart failure or effusion. IMPRESSION: Bilateral lower lobe pneumonia left worse than right. No dense consolidation or lobar collapse. Electronically Signed   By: Nelson Chimes M.D.   On: 03/23/2016 15:04      Management plans discussed with the patient and he is in agreement. Stable for discharge home with [facility]  Patient should follow up with  pcp/nephrology  CODE STATUS:     Code Status Orders        Start     Ordered   03/23/16 2018  Do not attempt resuscitation (DNR)  Continuous    Question Answer Comment  In the event of cardiac or respiratory ARREST Do not call a "code blue"   In the event of cardiac or respiratory ARREST Do not perform Intubation, CPR, defibrillation or ACLS   In the event of cardiac or respiratory ARREST Use medication by any route, position, wound care, and other measures to relive pain and suffering. May use oxygen, suction and manual treatment of airway obstruction as needed for comfort.   Comments nurse may pronounce      03/23/16 2017    Code Status History    Date Active Date Inactive Code Status Order ID Comments User Context   03/11/2016  1:41 PM 03/14/2016  4:06 PM DNR [id number]  Bettey Costa, MD ED   03/03/2016  3:36 PM 03/04/2016  7:06 AM DNR [id number]  Bettey Costa, MD Inpatient    Advance Directive Documentation   Flowsheet Row Most Recent Value  Type of Advance Directive  Out of facility DNR (pink MOST or yellow form)  Pre-existing out of facility DNR order (yellow form or pink MOST form)  Yellow form placed in chart (order not valid for inpatient use)  "MOST" Form in Place?  No data      TOTAL TIME TAKING CARE OF THIS PATIENT: 38 minutes.    Note: This dictation was prepared with Dragon dictation along with smaller phrase technology. Any transcriptional errors that result from this process are unintentional.  Duyen Beckom M.D on 03/24/2016 at 10:54 AM  Between 7am to 6pm - Pager - [phone] After 6pm go to [email or web address] - password [email or web address]  Office  [phone]  CC: Primary care physician; [name], MD

## 2016-03-24 NOTE — Progress Notes (Signed)
Pharmacy Antibiotic Note  Michael Mathews is a 70 y.o. male admitted on 03/23/2016 with pneumonia.  Pharmacy has been consulted for levofloxacin dosing   Plan: Initially started on vancomycin, cefepime, and azithromycin. To discharge home on levofloxacin  Levofloxacin 750mg  PO Q24H to start tomorrow.  Height: 5\' 10"  (177.8 cm) Weight: 245 lb (111.1 kg) IBW/kg (Calculated) : 73  Adj BW= 88.2 kg  Temp (24hrs), Avg:98.2 F (36.8 C), Min:97.9 F (36.6 C), Max:98.4 F (36.9 C)   Recent Labs Lab 03/23/16 1543 03/23/16 1735 03/24/16 0007  WBC 6.1  --  5.1  CREATININE 1.80*  --  1.61*  LATICACIDVEN 3.4* 1.9  --     Estimated Creatinine Clearance: 52.5 mL/min (by C-G formula based on SCr of 1.61 mg/dL (H)).    No Known Allergies  Antimicrobials this admission: Cefepime 10/12 >>   Vanc  10/12 >>   Azithromycin 10/12 >>  Dose adjustments this admission:    Microbiology results: 10/12 BCx: NGTD 10/12 UCx: P    Sputum:    10/12 MRSA PCR: negative  Thank you for allowing pharmacy to be a part of this patient's care.  [name] C 03/24/2016 11:20 AM

## 2016-03-24 NOTE — Progress Notes (Signed)
Pt d/c home; d/c instructions reviewed w/ pt; pt understanding was verbalized; IVs removed, catheters in tact, gauze dressings applied; all pt questions answered; pt verbalized that all pt belongings were accounted for; pt left unit via wheelchair accompanied by staff

## 2016-03-25 LAB — URINE CULTURE

## 2016-03-28 LAB — CULTURE, BLOOD (ROUTINE X 2)
CULTURE: NO GROWTH
Culture: NO GROWTH

## 2016-04-05 ENCOUNTER — Ambulatory Visit: Payer: Self-pay | Admitting: Urology

## 2016-04-05 ENCOUNTER — Encounter: Payer: Self-pay | Admitting: Urology

## 2016-04-13 ENCOUNTER — Encounter: Payer: Self-pay | Admitting: Emergency Medicine

## 2016-04-13 ENCOUNTER — Emergency Department
Admission: EM | Admit: 2016-04-13 | Discharge: 2016-04-13 | Disposition: A | Discharge status: Home / Self Care | Payer: Medicare Other | Attending: Emergency Medicine | Admitting: Emergency Medicine

## 2016-04-13 DIAGNOSIS — J45909 Unspecified asthma, uncomplicated: Secondary | ICD-10-CM | POA: Diagnosis not present

## 2016-04-13 DIAGNOSIS — Z7982 Long term (current) use of aspirin: Secondary | ICD-10-CM | POA: Diagnosis not present

## 2016-04-13 DIAGNOSIS — E1122 Type 2 diabetes mellitus with diabetic chronic kidney disease: Secondary | ICD-10-CM | POA: Diagnosis not present

## 2016-04-13 DIAGNOSIS — Z7984 Long term (current) use of oral hypoglycemic drugs: Secondary | ICD-10-CM | POA: Insufficient documentation

## 2016-04-13 DIAGNOSIS — R339 Retention of urine, unspecified: Secondary | ICD-10-CM | POA: Diagnosis present

## 2016-04-13 DIAGNOSIS — Z79899 Other long term (current) drug therapy: Secondary | ICD-10-CM | POA: Insufficient documentation

## 2016-04-13 DIAGNOSIS — R1030 Lower abdominal pain, unspecified: Secondary | ICD-10-CM | POA: Diagnosis not present

## 2016-04-13 DIAGNOSIS — N189 Chronic kidney disease, unspecified: Secondary | ICD-10-CM | POA: Insufficient documentation

## 2016-04-13 DIAGNOSIS — I129 Hypertensive chronic kidney disease with stage 1 through stage 4 chronic kidney disease, or unspecified chronic kidney disease: Secondary | ICD-10-CM | POA: Insufficient documentation

## 2016-04-13 DIAGNOSIS — J449 Chronic obstructive pulmonary disease, unspecified: Secondary | ICD-10-CM | POA: Insufficient documentation

## 2016-04-13 DIAGNOSIS — F17200 Nicotine dependence, unspecified, uncomplicated: Secondary | ICD-10-CM | POA: Diagnosis not present

## 2016-04-13 LAB — URINALYSIS COMPLETE WITH MICROSCOPIC (ARMC ONLY)
BACTERIA UA: NONE SEEN
Bilirubin Urine: NEGATIVE
Glucose, UA: NEGATIVE mg/dL
KETONES UR: NEGATIVE mg/dL
LEUKOCYTES UA: NEGATIVE
Nitrite: NEGATIVE
PH: 7 (ref 5.0–8.0)
PROTEIN: 30 mg/dL — AB
SQUAMOUS EPITHELIAL / LPF: NONE SEEN
Specific Gravity, Urine: 1.01 (ref 1.005–1.030)

## 2016-04-13 LAB — CBC WITH DIFFERENTIAL/PLATELET
BASOS ABS: 0 10*3/uL (ref 0–0.1)
BLASTS: 0 %
Band Neutrophils: 2 %
Basophils Relative: 0 %
Eosinophils Absolute: 0.1 10*3/uL (ref 0–0.7)
Eosinophils Relative: 2 %
HEMATOCRIT: 30.6 % — AB (ref 40.0–52.0)
Hemoglobin: 10.1 g/dL — ABNORMAL LOW (ref 13.0–18.0)
Lymphocytes Relative: 12 %
Lymphs Abs: 0.9 10*3/uL — ABNORMAL LOW (ref 1.0–3.6)
MCH: 31.3 pg (ref 26.0–34.0)
MCHC: 33.1 g/dL (ref 32.0–36.0)
MCV: 94.6 fL (ref 80.0–100.0)
METAMYELOCYTES PCT: 3 %
MYELOCYTES: 0 %
Monocytes Absolute: 0.4 10*3/uL (ref 0.2–1.0)
Monocytes Relative: 6 %
Neutro Abs: 5.8 10*3/uL (ref 1.4–6.5)
Neutrophils Relative %: 75 %
Other: 0 %
Platelets: 173 10*3/uL (ref 150–440)
Promyelocytes Absolute: 0 %
RBC: 3.24 MIL/uL — AB (ref 4.40–5.90)
RDW: 16.6 % — ABNORMAL HIGH (ref 11.5–14.5)
WBC: 7.2 10*3/uL (ref 3.8–10.6)
nRBC: 0 /100 WBC

## 2016-04-13 LAB — BASIC METABOLIC PANEL
ANION GAP: 12 (ref 5–15)
BUN: 17 mg/dL (ref 6–20)
CO2: 24 mmol/L (ref 22–32)
Calcium: 8.7 mg/dL — ABNORMAL LOW (ref 8.9–10.3)
Chloride: 103 mmol/L (ref 101–111)
Creatinine, Ser: 0.92 mg/dL (ref 0.61–1.24)
GFR calc Af Amer: >60 mL/min (ref >60)
GLUCOSE: 157 mg/dL — AB (ref 65–99)
POTASSIUM: 4.2 mmol/L (ref 3.5–5.1)
Sodium: 139 mmol/L (ref 135–145)

## 2016-04-13 NOTE — ED Provider Notes (Signed)
[facility] Emergency Department Provider Note  ____________________________________________   I have reviewed the triage vital signs and the nursing notes.   HISTORY  Chief Complaint Urinary Retention    HPI Michael Mathews is a 70 y.o. male who has a history apparently of a large prostate he believes, presents today with urinary retention. States he has not urinated since last night. Been drinking "a lot of water" noted make himself urinate. Denies any pain and has a sense of fullness to his lower abdomen. Denies any recent dysuria or urinary frequency fever or flank pain. Was recently hospitalized foracute kidney injury in the and then had a sepsis admission, secondary to pneumonia, he was to be seen by urology as an outpatient states she was going to call today but then this happened. Patient does have chronic kidney disease. He is CK V3/4 last time that was thought to be secondary to acute on chronic kidney disease with no further toxicity from contrast-induced nephropathy. His last creatinine 2 weeks ago was 1.61.     Past Medical History:  Diagnosis Date  . Arthritis   . Asthma   . COPD (chronic obstructive pulmonary disease) ([place])   . Cough    chronic  . Diabetes mellitus without complication ([place])   . Edema   . Elevated PSA   . Hypertension   . Orthopnea   . Oxygen deficit    o2 prn  . Shortness of breath dyspnea   . Sleep apnea    cpap    Patient Active Problem List   Diagnosis Date Noted  . Pneumonia 03/23/2016  . ARF (acute renal failure) ([place]) 03/11/2016  . Dizziness 03/03/2016    Past Surgical History:  Procedure Laterality Date  . CARDIAC CATHETERIZATION    . COLONOSCOPY    . EYE SURGERY    . KNEE ARTHROSCOPY      Prior to Admission medications   Medication Sig Start Date End Date Taking? Authorizing Provider  aspirin EC 325 MG tablet Take 325 mg by mouth every morning.    Historical Provider, MD  atorvastatin (LIPITOR) 40  MG tablet Take 1 tablet (40 mg total) by mouth daily. 03/24/16   Bettey Costa, MD  cloNIDine (CATAPRES) 0.1 MG tablet Take 1 tablet (0.1 mg total) by mouth 2 (two) times daily. 03/24/16   Bettey Costa, MD  DULoxetine (CYMBALTA) 30 MG capsule Take 30 mg by mouth daily. 01/04/16   Historical Provider, MD  fludrocortisone (FLORINEF) 0.1 MG tablet Take 1 tablet (0.1 mg total) by mouth daily. 03/24/16   Bettey Costa, MD  Fluticasone-Salmeterol (ADVAIR) 250-50 MCG/DOSE AEPB Inhale 1 puff into the lungs 2 (two) times daily.    Historical Provider, MD  GLIPIZIDE XL 10 MG 24 hr tablet Take 20 mg by mouth daily. 01/19/16   Historical Provider, MD  levofloxacin (LEVAQUIN) 750 MG tablet Take 1 tablet (750 mg total) by mouth daily. 03/24/16   Bettey Costa, MD  meclizine (ANTIVERT) 12.5 MG tablet Take 1 tablet (12.5 mg total) by mouth 2 (two) times daily as needed for dizziness. 03/05/16   Dustin Flock, MD  metoprolol (LOPRESSOR) 50 MG tablet Take 50 mg by mouth 2 (two) times daily.    Historical Provider, MD  nicotine (NICODERM CQ - DOSED IN MG/24 HOURS) 14 mg/24hr patch Place 1 patch (14 mg total) onto the skin daily. 03/25/16   Bettey Costa, MD  pantoprazole (PROTONIX) 40 MG tablet Take 1 tablet (40 mg total) by mouth daily. 03/05/16  Dustin Flock, MD  tamsulosin (FLOMAX) 0.4 MG CAPS capsule Take 1 capsule (0.4 mg total) by mouth daily. 03/05/16   Dustin Flock, MD  vitamin B-12 (CYANOCOBALAMIN) 100 MCG tablet Take 1 tablet (100 mcg total) by mouth daily. 03/24/16   Bettey Costa, MD    Allergies Review of patient's allergies indicates no known allergies.  Family History  Problem Relation Age of Onset  . CVA Mother   . CAD Father     Social History Social History  Substance Use Topics  . Smoking status: Current Every Day Smoker    Packs/day: 0.50    Years: 55.00  . Smokeless tobacco: Current User  . Alcohol use No    Review of Systems Constitutional: No fever/chills Eyes: No visual changes. ENT: No sore  throat. No stiff neck no neck pain Cardiovascular: Denies chest pain. Respiratory: Denies shortness of breath. Gastrointestinal:   no vomiting.  No diarrhea.  No constipation. Genitourinary: Negative for dysuria. Musculoskeletal: Negative lower extremity swelling Skin: Negative for rash. Neurological: Negative for severe headaches, focal weakness or numbness. 10-point ROS otherwise negative.  ____________________________________________   PHYSICAL EXAM:  VITAL SIGNS: ED Triage Vitals  Enc Vitals Group     BP 04/13/16 0718 (!) 232/104     Pulse Rate 04/13/16 0718 93     Resp 04/13/16 0718 16     Temp 04/13/16 0718 97.4 F (36.3 C)     Temp Source 04/13/16 0718 Oral     SpO2 04/13/16 0718 96 %     Weight 04/13/16 0717 235 lb (106.6 kg)     Height 04/13/16 0717 5\' 10"  (1.778 m)     Head Circumference --      Peak Flow --      Pain Score 04/13/16 0718 7     Pain Loc --      Pain Edu? --      Excl. in [place]? --     Constitutional: Alert and oriented. Well appearing and in no acute distress. Eyes: Conjunctivae are normal. PERRL. EOMI. Head: Atraumatic. Nose: No congestion/rhinnorhea. Mouth/Throat: Mucous membranes are moist.  Oropharynx non-erythematous. Neck: No stridor.   Nontender with no meningismus Cardiovascular: Normal rate, regular rhythm. Grossly normal heart sounds.  Good peripheral circulation. Respiratory: Normal respiratory effort.  No retractions. Lungs CTAB. Abdominal: Soft and Initially there was some lower pelvic tenderness completely relieved by Foley placement.. No distention. No guarding no rebound Back:  There is no focal tenderness or step off.  there is no midline tenderness there are no lesions noted. there is no CVA tenderness Musculoskeletal: No lower extremity tenderness, no upper extremity tenderness. No joint effusions, no DVT signs strong distal pulses no edema Neurologic:  Normal speech and language. No gross focal neurologic deficits are  appreciated.  Skin:  Skin is warm, dry and intact. No rash noted. Psychiatric: Mood and affect are normal. Speech and behavior are normal.  ____________________________________________   LABS (all labs ordered are listed, but only abnormal results are displayed)  Labs Reviewed  URINALYSIS COMPLETEWITH MICROSCOPIC ([place]) - Abnormal; Notable for the following:       Result Value   Color, Urine STRAW (*)    APPearance CLEAR (*)    Hgb urine dipstick 1+ (*)    Protein, ur 30 (*)    All other components within normal limits  CBC WITH DIFFERENTIAL/PLATELET  BASIC METABOLIC PANEL   ____________________________________________  EKG  I personally interpreted any EKGs ordered by me or triage  ____________________________________________  RADIOLOGY  I reviewed any imaging ordered by me or triage that were performed during my shift and, if possible, patient and/or family made aware of any abnormal findings. ____________________________________________   PROCEDURES  Procedure(s) performed: None  Procedures  Critical Care performed: None  ____________________________________________   INITIAL IMPRESSION / ASSESSMENT AND PLAN / ED COURSE  Pertinent labs & imaging results that were available during my care of the patient were reviewed by me and considered in my medical decision making (see chart for details).  Patient with urinary retention in the context of chronic kidney disease. We'll check a BUN/creatinine CBC and urinalysis. He has had complete relief after Foley placement. We are so effectively how much he had in there but seems to be at least a half liter and likely more.  Clinical Course   ____________________________________________   FINAL CLINICAL IMPRESSION(S) / ED DIAGNOSES  Final diagnoses:  None      This chart was dictated using voice recognition software.  Despite best efforts to proofread,  errors can occur which can change meaning.       Schuyler Amor, MD 04/13/16 [phone]

## 2016-04-13 NOTE — ED Notes (Addendum)
Pt educated on urinary catheter care and catheter bag care. Pt verbalized understanding of d/c and f/u instructions. Pt refused leg bag.

## 2016-04-13 NOTE — ED Triage Notes (Signed)
Pt here from home via [facility] with lower abdominal pain, pt hasn't been able to void since 2300 last night.

## 2016-05-01 ENCOUNTER — Encounter: Payer: Self-pay | Admitting: Urology

## 2016-05-01 ENCOUNTER — Ambulatory Visit (INDEPENDENT_AMBULATORY_CARE_PROVIDER_SITE_OTHER): Payer: Medicare Other | Admitting: Urology

## 2016-05-01 VITALS — BP 144/67 | HR 67 | Ht 70.0 in | Wt 236.1 lb

## 2016-05-01 DIAGNOSIS — I639 Cerebral infarction, unspecified: Secondary | ICD-10-CM

## 2016-05-01 DIAGNOSIS — R972 Elevated prostate specific antigen [PSA]: Secondary | ICD-10-CM | POA: Diagnosis not present

## 2016-05-01 DIAGNOSIS — R339 Retention of urine, unspecified: Secondary | ICD-10-CM | POA: Diagnosis not present

## 2016-05-01 NOTE — Progress Notes (Signed)
05/01/2016 8:50 AM   Michael Mathews [birth year] [id number]  Referring provider: [name], MD [place], [place] [address]  Chief Complaint  Patient presents with  . New Patient (Initial Visit)    urinary retention     HPI: [name] is a 70yo with a hx of elevated PSA and urinary retention here for evaluation. PSA 10.81. No previous PSA per patient.  He had an episode of ARF likely due to retention and creatinine was over 3 and is now 0.92. He foley has been in place for 2 weeks. He is currently on flomax which was started 2 months ago. He had severe LUTS for the past 6 months including nocturia 3-4x, urgency, frequency. He had associated dysuria at the time the foley placed. No urine culture was sent.  Renal US 1 month ago showed no hydronephrosis.   No other exacerbating/alleviating events. No other associated symptoms   PMH: Past Medical History:  Diagnosis Date  . Arthritis   . Asthma   . COPD (chronic obstructive pulmonary disease) ([place])   . Cough    chronic  . Diabetes mellitus without complication ([place])   . Edema   . Elevated PSA   . Hypertension   . Orthopnea   . Oxygen deficit    o2 prn  . Shortness of breath dyspnea   . Sleep apnea    cpap    Surgical History: Past Surgical History:  Procedure Laterality Date  . CARDIAC CATHETERIZATION    . COLONOSCOPY    . EYE SURGERY    . KNEE ARTHROSCOPY      Home Medications:    Medication List       Accurate as of 05/01/16  8:50 AM. Always use your most recent med list.          aspirin EC 325 MG tablet Take 325 mg by mouth every morning.   atorvastatin 40 MG tablet Commonly known as:  LIPITOR Take 1 tablet (40 mg total) by mouth daily.   cloNIDine 0.1 MG tablet Commonly known as:  CATAPRES Take 1 tablet (0.1 mg total) by mouth 2 (two) times daily.   DULoxetine 30 MG capsule Commonly known as:  CYMBALTA Take 30 mg by mouth daily.   fludrocortisone 0.1 MG tablet Commonly  known as:  FLORINEF Take 1 tablet (0.1 mg total) by mouth daily.   Fluticasone-Salmeterol 250-50 MCG/DOSE Aepb Commonly known as:  ADVAIR Inhale 1 puff into the lungs 2 (two) times daily.   GLIPIZIDE XL 10 MG 24 hr tablet Generic drug:  glipiZIDE Take 20 mg by mouth daily.   levofloxacin 750 MG tablet Commonly known as:  LEVAQUIN Take 1 tablet (750 mg total) by mouth daily.   meclizine 12.5 MG tablet Commonly known as:  ANTIVERT Take 1 tablet (12.5 mg total) by mouth 2 (two) times daily as needed for dizziness.   metoprolol 50 MG tablet Commonly known as:  LOPRESSOR Take 50 mg by mouth 2 (two) times daily.   nicotine 14 mg/24hr patch Commonly known as:  NICODERM CQ - dosed in mg/24 hours Place 1 patch (14 mg total) onto the skin daily.   pantoprazole 40 MG tablet Commonly known as:  PROTONIX Take 1 tablet (40 mg total) by mouth daily.   tamsulosin 0.4 MG Caps capsule Commonly known as:  FLOMAX Take 1 capsule (0.4 mg total) by mouth daily.   vitamin B-12 100 MCG tablet Commonly known as:  CYANOCOBALAMIN Take 1 tablet (  100 mcg total) by mouth daily.       Allergies: No Known Allergies  Family History: Family History  Problem Relation Age of Onset  . CVA Mother   . CAD Father     Social History:  reports that he has been smoking.  He has a 27.50 pack-year smoking history. He uses smokeless tobacco. He reports that he does not drink alcohol or use drugs.  ROS: UROLOGY Frequent Urination?: Yes Hard to postpone urination?: Yes Burning/pain with urination?: Yes Get up at night to urinate?: Yes Leakage of urine?: No Urine stream starts and stops?: No Trouble starting stream?: No Do you have to strain to urinate?: No Blood in urine?: No Urinary tract infection?: No Sexually transmitted disease?: Yes Injury to kidneys or bladder?: No Painful intercourse?: No Weak stream?: No Erection problems?: No Penile pain?: No  Gastrointestinal Nausea?:  No Vomiting?: No Indigestion/heartburn?: No Diarrhea?: No Constipation?: No  Constitutional Fever: No Night sweats?: No Weight loss?: No Fatigue?: No  Skin Skin rash/lesions?: No Itching?: No  Eyes Blurred vision?: Yes Double vision?: No  Ears/Nose/Throat Sore throat?: No Sinus problems?: No  Hematologic/Lymphatic Swollen glands?: No  Cardiovascular Leg swelling?: Yes Chest pain?: No  Respiratory Cough?: No Shortness of breath?: No  Endocrine Excessive thirst?: No  Musculoskeletal Back pain?: Yes Joint pain?: Yes  Neurological Headaches?: Yes Dizziness?: Yes  Psychologic Depression?: No Anxiety?: No  Physical Exam: BP (!) 144/67   Pulse 67   Ht 5\' 10"  (1.778 m)   Wt 107.1 kg (236 lb 1.6 oz)   BMI 33.88 kg/m   Constitutional:  Alert and oriented, No acute distress. HEENT: [place] AT, moist mucus membranes.  Trachea midline, no masses. Cardiovascular: No clubbing, cyanosis, or edema. Respiratory: Normal respiratory effort, no increased work of breathing. GI: Abdomen is soft, nontender, nondistended, no abdominal masses GU: No CVA tenderness. Uncircumcised phallus, no masses/lesions on penis, testes, scrotum. Prostate 40g left lobe firm.  Skin: No rashes, bruises or suspicious lesions. Lymph: No cervical or inguinal adenopathy. Neurologic: Grossly intact, no focal deficits, moving all 4 extremities. Psychiatric: Normal mood and affect.  Laboratory Data: Lab Results  Component Value Date   WBC 7.2 04/13/2016   HGB 10.1 (L) 04/13/2016   HCT 30.6 (L) 04/13/2016   MCV 94.6 04/13/2016   PLT 173 04/13/2016    Lab Results  Component Value Date   CREATININE 0.92 04/13/2016    Lab Results  Component Value Date   PSA 10.81 (H) 03/05/2016    No results found for: TESTOSTERONE  No results found for: HGBA1C  Urinalysis    Component Value Date/Time   COLORURINE STRAW (A) 04/13/2016 0724   APPEARANCEUR CLEAR (A) 04/13/2016 0724   LABSPEC 1.010  04/13/2016 0724   PHURINE 7.0 04/13/2016 0724   GLUCOSEU NEGATIVE 04/13/2016 0724   HGBUR 1+ (A) 04/13/2016 0724   BILIRUBINUR NEGATIVE 04/13/2016 0724   KETONESUR NEGATIVE 04/13/2016 0724   PROTEINUR 30 (A) 04/13/2016 0724   NITRITE NEGATIVE 04/13/2016 0724   LEUKOCYTESUR NEGATIVE 04/13/2016 0724    Pertinent Imaging: Renal US  Assessment & Plan:   1. Elevated PSa -RTC 1 months with PSA  2. BPH with LUTS, urinary retention. -Continue flomax -voiding trial passed today  There are no diagnoses linked to this encounter.  No Follow-up on file.  Nicolette Bang, MD  [facility] [address] [place], [place] [address] [phone]

## 2016-05-08 ENCOUNTER — Ambulatory Visit: Payer: Self-pay

## 2016-05-29 ENCOUNTER — Ambulatory Visit (INDEPENDENT_AMBULATORY_CARE_PROVIDER_SITE_OTHER): Payer: Medicare Other | Admitting: Urology

## 2016-05-29 VITALS — BP 127/71 | HR 84 | Ht 70.0 in | Wt 229.7 lb

## 2016-05-29 DIAGNOSIS — R972 Elevated prostate specific antigen [PSA]: Secondary | ICD-10-CM | POA: Diagnosis not present

## 2016-05-29 DIAGNOSIS — I639 Cerebral infarction, unspecified: Secondary | ICD-10-CM | POA: Diagnosis not present

## 2016-05-29 DIAGNOSIS — R339 Retention of urine, unspecified: Secondary | ICD-10-CM

## 2016-05-29 LAB — BLADDER SCAN AMB NON-IMAGING: Scan Result: 33

## 2016-05-29 MED ORDER — FINASTERIDE 5 MG PO TABS: 5.0000 mg | ORAL_TABLET | Freq: Every day | ORAL | 3 refills | Status: DC

## 2016-05-29 MED ORDER — TAMSULOSIN HCL 0.4 MG PO CAPS
0.4000 mg | ORAL_CAPSULE | Freq: Every day | ORAL | 3 refills | Status: DC
Start: 2016-05-29 — End: 2018-04-12

## 2016-05-29 NOTE — Progress Notes (Signed)
05/29/2016 6:55 AM   Michael Mathews 05/31/1946 [id number]  Referring provider: [name], MD [place], [place] [address]  No chief complaint on file.   HPI:  1 - Lower Urinary Tract Symptom / Urinary Retention  - acute urinary retention 02/2016 managed with catheter x 2 mos and start tamsulosin. Passed voiding trial 04/2016. Cr <1 after resolution. Added finasteride 05/2016.   2 - Elevated PSA- PSA 10.81 (pre finasteride) during episode urinary retention 02/2016. Was to have repeat drawn prior to visit today but that was not done.  05/2016 DRE 45gm some firmness.   PMH sig for DM2, COPD/O2/Still smokes, OSA/CPAP.   Today "Michael Mathews" is seen in f/u above. He was to have PSA prior but this was not done. No recurrent reteniton.    PMH: Past Medical History:  Diagnosis Date  . Arthritis   . Asthma   . COPD (chronic obstructive pulmonary disease) ([place])   . Cough    chronic  . Diabetes mellitus without complication ([place])   . Edema   . Elevated PSA   . Hypertension   . Orthopnea   . Oxygen deficit    o2 prn  . Shortness of breath dyspnea   . Sleep apnea    cpap    Surgical History: Past Surgical History:  Procedure Laterality Date  . CARDIAC CATHETERIZATION    . COLONOSCOPY    . EYE SURGERY    . KNEE ARTHROSCOPY      Home Medications:  Allergies as of 05/29/2016   No Known Allergies     Medication List       Accurate as of 05/29/16  6:55 AM. Always use your most recent med list.          aspirin EC 325 MG tablet Take 325 mg by mouth every morning.   atorvastatin 40 MG tablet Commonly known as:  LIPITOR Take 1 tablet (40 mg total) by mouth daily.   cloNIDine 0.1 MG tablet Commonly known as:  CATAPRES Take 1 tablet (0.1 mg total) by mouth 2 (two) times daily.   DULoxetine 30 MG capsule Commonly known as:  CYMBALTA Take 30 mg by mouth daily.   fludrocortisone 0.1 MG tablet Commonly known as:  FLORINEF Take 1 tablet (0.1 mg  total) by mouth daily.   Fluticasone-Salmeterol 250-50 MCG/DOSE Aepb Commonly known as:  ADVAIR Inhale 1 puff into the lungs 2 (two) times daily.   GLIPIZIDE XL 10 MG 24 hr tablet Generic drug:  glipiZIDE Take 20 mg by mouth daily.   levofloxacin 750 MG tablet Commonly known as:  LEVAQUIN Take 1 tablet (750 mg total) by mouth daily.   meclizine 12.5 MG tablet Commonly known as:  ANTIVERT Take 1 tablet (12.5 mg total) by mouth 2 (two) times daily as needed for dizziness.   metoprolol 50 MG tablet Commonly known as:  LOPRESSOR Take 50 mg by mouth 2 (two) times daily.   nicotine 14 mg/24hr patch Commonly known as:  NICODERM CQ - dosed in mg/24 hours Place 1 patch (14 mg total) onto the skin daily.   pantoprazole 40 MG tablet Commonly known as:  PROTONIX Take 1 tablet (40 mg total) by mouth daily.   tamsulosin 0.4 MG Caps capsule Commonly known as:  FLOMAX Take 1 capsule (0.4 mg total) by mouth daily.   vitamin B-12 100 MCG tablet Commonly known as:  CYANOCOBALAMIN Take 1 tablet (100 mcg total) by mouth daily.  Allergies: No Known Allergies  Family History: Family History  Problem Relation Age of Onset  . CVA Mother   . CAD Father     Social History:  reports that he has been smoking.  He has a 27.50 pack-year smoking history. He uses smokeless tobacco. He reports that he does not drink alcohol or use drugs.   Review of Systems  Gastrointestinal (upper)  : Negative for upper GI symptoms  Gastrointestinal (lower) : Negative for lower GI symptoms  Constitutional : Negative for symptoms  Skin: Negative for skin symptoms  Eyes: Negative for eye symptoms  Ear/Nose/Throat : Negative for Ear/Nose/Throat symptoms  Hematologic/Lymphatic: Negative for Hematologic/Lymphatic symptoms  Cardiovascular : Negative for cardiovascular symptoms  Respiratory : Shortness of breath  Endocrine: Negative for endocrine symptoms  Musculoskeletal: Negative  for musculoskeletal symptoms  Neurological: Negative for neurological symptoms  Psychologic: Negative for psychiatric symptoms      Physical Exam: There were no vitals taken for this visit.  Constitutional:  Alert and oriented, No acute distress. Somewhat disheveled. HEENT: [place] AT, moist mucus membranes.  Trachea midline, no masses. Cardiovascular: No clubbing, cyanosis, or edema. Respiratory: Normal respiratory effort, no increased work of breathing. GI: Abdomen is soft, nontender, nondistended, no abdominal masses GU: No CVA tenderness. Uncircumcised phallus, no masses/lesions on penis, testes, scrotum.  45gm prostate with some firmness throughout / no frank nodules.  Skin: No rashes, bruises or suspicious lesions. Lymph: No cervical or inguinal adenopathy. Neurologic: Grossly intact, no focal deficits, moving all 4 extremities. Psychiatric: Normal mood and affect.  Laboratory Data: Lab Results  Component Value Date   WBC 7.2 04/13/2016   HGB 10.1 (L) 04/13/2016   HCT 30.6 (L) 04/13/2016   MCV 94.6 04/13/2016   PLT 173 04/13/2016    Lab Results  Component Value Date   CREATININE 0.92 04/13/2016    Lab Results  Component Value Date   PSA 10.81 (H) 03/05/2016    No results found for: TESTOSTERONE  No results found for: HGBA1C  Urinalysis    Component Value Date/Time   COLORURINE STRAW (A) 04/13/2016 0724   APPEARANCEUR CLEAR (A) 04/13/2016 0724   LABSPEC 1.010 04/13/2016 0724   PHURINE 7.0 04/13/2016 0724   GLUCOSEU NEGATIVE 04/13/2016 0724   HGBUR 1+ (A) 04/13/2016 0724   BILIRUBINUR NEGATIVE 04/13/2016 0724   KETONESUR NEGATIVE 04/13/2016 0724   PROTEINUR 30 (A) 04/13/2016 0724   NITRITE NEGATIVE 04/13/2016 0724   LEUKOCYTESUR NEGATIVE 04/13/2016 0724      Assessment & Plan:    1 - Lower Urinary Tract Symptom / Urinary Retention  - continue alpha blocker and add finasteride. PVR remains good.  2 - Elevated PSA-  PSA today, further eval pending  result. Very high threshold to agressiv work up given age >69 and substantial CV/Pulm comorbidity.  RTC 1 year with PVR, PSA    [name], MD  [facility] [address] [place], [place] [address] [phone]

## 2016-05-30 LAB — PSA: PROSTATE SPECIFIC AG, SERUM: 10.3 ng/mL — AB (ref 0.0–4.0)

## 2016-06-08 ENCOUNTER — Telehealth: Payer: Self-pay

## 2016-06-08 NOTE — Telephone Encounter (Signed)
Pt PCP, Dr. Clide Deutscher, called stating she saw pt today and was noting previous hospitalizations along with imaging that has been done. Dr. Clide Deutscher noted metastatic disease from imaging. Dr. Clide Deutscher also stated that she does not see mention of prostate cancer mets in our documentation and wanted to know if she should be looking for other forms of cancer that has mets. Pt does have a referral to oncology. Please advise.

## 2016-06-13 NOTE — Telephone Encounter (Signed)
Michael Frock, MD  [place] Singer, CMA        [name] PSA still quite high even without catheter. Rec F/u 20mos with PSA BEFORE appt and rediscuss as he may warrant biopsy if remains elevated.   Thanks,  T Manny   Pt vm states the wireless caller is not available at this time and to try the call again later.

## 2016-06-14 NOTE — Telephone Encounter (Signed)
I attempted to contact the pt again, no answer. His vm states he's not available. I called his neighbor Jodi Marble and left a message with him to have the pt to return my call.

## 2016-06-19 NOTE — Progress Notes (Signed)
Hematology/Oncology Consult note [facility] Telephone:([phone] [phone]  Patient Care Team: [name], MD as PCP - General (Family Medicine)   Name of the patient: Michael Mathews  [id number]  September 04, 1945    Reason for referral- sclerotic bone lesions noted incidentally on CT abdomen and MRI spine   Referring physician- Dr. Tomasa Hose  Date of visit: 06/19/16   History of presenting illness- patient is 71 year old male with a past medical history significant for hypertension diabetes and COPD among other medical problems. He was admitted to the hospital in October 2017 with some symptoms of dizziness and abdominal pain. CT angiogram abdomen incidentally showed sclerotic metastatic disease throughout the visualized spine. This was followed by an MRI of the lumbar spine which showed multifocal T1 and T2 hypointense lesions with areas of increased signal on STIR sequence are seen in multiple vertebral bodies. Largest lesions are in T11 T12 L1 and in the imaged sacrum. This is consistent with metastatic disease. Primary lesion is not identified. MRI brain showed no acute intracranial abnormality. Patient was noted to have an elevated PSA and was referred to urology as an outpatient. He was recently seen by Dr. Tresa Moore on 05/29/2016. PSA was repeated at that time which came back elevated at 10.3. 3 months over the value was 10.81. Patient has been sent to me for evaluation of sclerotic bone lesions  Patient lives alone and is independent of his ADLs and IADLs. She does have obstructive sleep apnea and uses nocturnal CPAP and oxygen. Reports fatigue and dyspnea on exertion. Reports chronic low back pain.  ECOG PS- 1  Pain scale- 3- back pain   Review of systems- Review of Systems  Constitutional: Positive for malaise/fatigue. Negative for chills, fever and weight loss.       Pursed lip breathing positive  HENT: Negative for congestion, ear discharge and  nosebleeds.   Eyes: Negative for blurred vision.  Respiratory: Positive for shortness of breath (On exertion). Negative for cough, hemoptysis, sputum production and wheezing.   Cardiovascular: Negative for chest pain, palpitations, orthopnea and claudication.  Gastrointestinal: Negative for abdominal pain, blood in stool, constipation, diarrhea, heartburn, melena, nausea and vomiting.  Genitourinary: Negative for dysuria, flank pain, frequency, hematuria and urgency.  Musculoskeletal: Positive for back pain. Negative for joint pain and myalgias.  Skin: Negative for rash.  Neurological: Negative for dizziness, tingling, focal weakness, seizures, weakness and headaches.  Endo/Heme/Allergies: Does not bruise/bleed easily.  Psychiatric/Behavioral: Negative for depression and suicidal ideas. The patient does not have insomnia.     No Known Allergies  Patient Active Problem List   Diagnosis Date Noted  . Elevated PSA 05/01/2016  . Urinary retention 05/01/2016  . Pneumonia 03/23/2016  . ARF (acute renal failure) ([place]) 03/11/2016  . Dizziness 03/03/2016     Past Medical History:  Diagnosis Date  . Arthritis   . Asthma   . COPD (chronic obstructive pulmonary disease) ([place])   . Cough    chronic  . Diabetes mellitus without complication ([place])   . Edema   . Elevated PSA   . Hypertension   . Orthopnea   . Oxygen deficit    o2 prn  . Shortness of breath dyspnea   . Sleep apnea    cpap     Past Surgical History:  Procedure Laterality Date  . CARDIAC CATHETERIZATION    . COLONOSCOPY    . EYE SURGERY    . KNEE ARTHROSCOPY      Social History  Social History  . Marital status: Unknown    Spouse name: N/A  . Number of children: N/A  . Years of education: N/A   Occupational History  . Not on file.   Social History Main Topics  . Smoking status: Current Every Day Smoker    Packs/day: 0.50    Years: 55.00  . Smokeless tobacco: Current User  . Alcohol use No  . Drug  use: No  . Sexual activity: Not on file   Other Topics Concern  . Not on file   Social History Narrative  . No narrative on file     Family History  Problem Relation Age of Onset  . CVA Mother   . CAD Father      Current Outpatient Prescriptions:  .  aspirin EC 325 MG tablet, Take 325 mg by mouth every morning., Disp: , Rfl:  .  atorvastatin (LIPITOR) 40 MG tablet, Take 1 tablet (40 mg total) by mouth daily., Disp: 30 tablet, Rfl: 0 .  cloNIDine (CATAPRES) 0.1 MG tablet, Take 1 tablet (0.1 mg total) by mouth 2 (two) times daily., Disp: 60 tablet, Rfl: 11 .  DULoxetine (CYMBALTA) 30 MG capsule, Take 30 mg by mouth daily., Disp: , Rfl:  .  finasteride (PROSCAR) 5 MG tablet, Take 1 tablet (5 mg total) by mouth daily., Disp: 90 tablet, Rfl: 3 .  fludrocortisone (FLORINEF) 0.1 MG tablet, Take 1 tablet (0.1 mg total) by mouth daily., Disp: 30 tablet, Rfl: 0 .  Fluticasone-Salmeterol (ADVAIR) 250-50 MCG/DOSE AEPB, Inhale 1 puff into the lungs 2 (two) times daily., Disp: , Rfl:  .  GLIPIZIDE XL 10 MG 24 hr tablet, Take 20 mg by mouth daily., Disp: , Rfl:  .  levofloxacin (LEVAQUIN) 750 MG tablet, Take 1 tablet (750 mg total) by mouth daily. (Patient not taking: Reported on 05/29/2016), Disp: 5 tablet, Rfl: 0 .  meclizine (ANTIVERT) 12.5 MG tablet, Take 1 tablet (12.5 mg total) by mouth 2 (two) times daily as needed for dizziness., Disp: 30 tablet, Rfl: 0 .  metoprolol (LOPRESSOR) 50 MG tablet, Take 50 mg by mouth 2 (two) times daily., Disp: , Rfl:  .  nicotine (NICODERM CQ - DOSED IN MG/24 HOURS) 14 mg/24hr patch, Place 1 patch (14 mg total) onto the skin daily., Disp: 28 patch, Rfl: 0 .  pantoprazole (PROTONIX) 40 MG tablet, Take 1 tablet (40 mg total) by mouth daily., Disp: 30 tablet, Rfl: 0 .  tamsulosin (FLOMAX) 0.4 MG CAPS capsule, Take 1 capsule (0.4 mg total) by mouth daily., Disp: 90 capsule, Rfl: 3 .  vitamin B-12 (CYANOCOBALAMIN) 100 MCG tablet, Take 1 tablet (100 mcg total) by  mouth daily., Disp: 30 tablet, Rfl: 0   Physical exam:  Vitals:   06/20/16 1329  BP: (!) 166/60  Pulse: 65  Temp: 97.8 F (36.6 C)  TempSrc: Tympanic  Weight: 233 lb 9.2 oz (106 kg)  Height: 5\' 10"  (1.778 m)   Physical Exam  Constitutional: He is oriented to person, place, and time and well-developed, well-nourished, and in no distress.  Elderly, frail appearing  HENT:  Head: Normocephalic and atraumatic.  Eyes: EOM are normal. Pupils are equal, round, and reactive to light.  Neck: Normal range of motion.  Cardiovascular: Normal rate, regular rhythm and normal heart sounds.   Pulmonary/Chest: Effort normal and breath sounds normal.  Abdominal: Soft. Bowel sounds are normal.  Genitourinary:  Genitourinary Comments: Prostate exam not done today  Neurological: He is alert and oriented to person,  place, and time.  Skin: Skin is warm and dry.       CMP Latest Ref Rng & Units 04/13/2016  Glucose 65 - 99 mg/dL 157(H)  BUN 6 - 20 mg/dL 17  Creatinine 0.61 - 1.24 mg/dL 0.92  Sodium 135 - 145 mmol/L 139  Potassium 3.5 - 5.1 mmol/L 4.2  Chloride 101 - 111 mmol/L 103  CO2 22 - 32 mmol/L 24  Calcium 8.9 - 10.3 mg/dL 8.7(L)  Total Protein 6.5 - 8.1 g/dL -  Total Bilirubin 0.3 - 1.2 mg/dL -  Alkaline Phos 38 - 126 U/L -  AST 15 - 41 U/L -  ALT 17 - 63 U/L -   CBC Latest Ref Rng & Units 06/20/2016  WBC 3.8 - 10.6 K/uL 5.2  Hemoglobin 13.0 - 18.0 g/dL 8.1(L)  Hematocrit 40.0 - 52.0 % 25.0(L)  Platelets 150 - 440 K/uL 164     Assessment and plan- Patient is a 71 y.o. male who has been referred to [place] for evaluation of sclerotic bony lesions found incidentally on CT abdomen  1. Findings of sclerotic lesions on CT abdomen as well as MRI spine along with elevated PSA is concerning for metastatic prostate cancer. Today I will repeat a CBC, CMP as well as uptake in systemic scans including CT chest abdomen and pelvis with contrast along with a bone scan. These bone lesions appear  sclerotic and less likely secondary to myeloma. However I will obtain myeloma panel including quantitative immunoglobulins, SPEP and serum immunofixation. Based on the results of the scan I will decide what site would be amenable to biopsy. I will tentatively plan to get a bone biopsy and see him back in about 3-4 weeks' time to discuss the results of the biopsy and scan and further management. I explained to the patient in detail the possibility of prostate cancer based on his prior scans. Patient is interested in finding out the possible diagnoses and is amenable to getting biopsy.  2. I also note is that on his today's blood work he is more anemic with an H&H of 8.1/25. I will therefore obtain a comprehensive anemia workup including LDH, reticulocyte count, haptoglobin, B12 and folate levels as well.   Total face to face encounter time for this patient visit was 45 min. >50% of the time was  spent in counseling and coordination of care.    Visit Diagnosis 1. Abnormal CT scan   2. Bone metastases ([place])   3. Anemia, unspecified type     Dr. Randa Evens, MD, MPH [facility] Pager- [phone] 06/19/2016 12:08 PM

## 2016-06-20 ENCOUNTER — Inpatient Hospital Stay: Payer: Medicare Other

## 2016-06-20 ENCOUNTER — Encounter: Payer: Self-pay | Admitting: Oncology

## 2016-06-20 ENCOUNTER — Inpatient Hospital Stay: Payer: Medicare Other | Attending: Oncology | Admitting: Oncology

## 2016-06-20 ENCOUNTER — Other Ambulatory Visit: Payer: Self-pay | Admitting: *Deleted

## 2016-06-20 VITALS — BP 166/60 | HR 65 | Temp 97.8°F | Ht 70.0 in | Wt 233.6 lb

## 2016-06-20 DIAGNOSIS — R9389 Abnormal findings on diagnostic imaging of other specified body structures: Secondary | ICD-10-CM

## 2016-06-20 DIAGNOSIS — C801 Malignant (primary) neoplasm, unspecified: Secondary | ICD-10-CM

## 2016-06-20 DIAGNOSIS — D649 Anemia, unspecified: Secondary | ICD-10-CM | POA: Insufficient documentation

## 2016-06-20 DIAGNOSIS — I10 Essential (primary) hypertension: Secondary | ICD-10-CM | POA: Diagnosis not present

## 2016-06-20 DIAGNOSIS — Z79899 Other long term (current) drug therapy: Secondary | ICD-10-CM | POA: Insufficient documentation

## 2016-06-20 DIAGNOSIS — R911 Solitary pulmonary nodule: Secondary | ICD-10-CM | POA: Insufficient documentation

## 2016-06-20 DIAGNOSIS — F17210 Nicotine dependence, cigarettes, uncomplicated: Secondary | ICD-10-CM | POA: Diagnosis not present

## 2016-06-20 DIAGNOSIS — G4733 Obstructive sleep apnea (adult) (pediatric): Secondary | ICD-10-CM | POA: Diagnosis not present

## 2016-06-20 DIAGNOSIS — N179 Acute kidney failure, unspecified: Secondary | ICD-10-CM | POA: Insufficient documentation

## 2016-06-20 DIAGNOSIS — E119 Type 2 diabetes mellitus without complications: Secondary | ICD-10-CM | POA: Insufficient documentation

## 2016-06-20 DIAGNOSIS — R972 Elevated prostate specific antigen [PSA]: Secondary | ICD-10-CM | POA: Insufficient documentation

## 2016-06-20 DIAGNOSIS — J440 Chronic obstructive pulmonary disease with acute lower respiratory infection: Secondary | ICD-10-CM | POA: Diagnosis not present

## 2016-06-20 DIAGNOSIS — C7951 Secondary malignant neoplasm of bone: Secondary | ICD-10-CM

## 2016-06-20 LAB — COMPREHENSIVE METABOLIC PANEL
ALK PHOS: 391 U/L — AB (ref 38–126)
ALT: 10 U/L — AB (ref 17–63)
AST: 19 U/L (ref 15–41)
Albumin: 3.3 g/dL — ABNORMAL LOW (ref 3.5–5.0)
Anion gap: 8 (ref 5–15)
BILIRUBIN TOTAL: 0.5 mg/dL (ref 0.3–1.2)
BUN: 11 mg/dL (ref 6–20)
CALCIUM: 8.4 mg/dL — AB (ref 8.9–10.3)
CO2: 27 mmol/L (ref 22–32)
CREATININE: 0.76 mg/dL (ref 0.61–1.24)
Chloride: 104 mmol/L (ref 101–111)
GFR calc non Af Amer: >60 mL/min (ref >60)
Glucose, Bld: 113 mg/dL — ABNORMAL HIGH (ref 65–99)
Potassium: 3.7 mmol/L (ref 3.5–5.1)
Sodium: 139 mmol/L (ref 135–145)
TOTAL PROTEIN: 7 g/dL (ref 6.5–8.1)

## 2016-06-20 LAB — RETICULOCYTES
RBC.: 2.7 MIL/uL — AB (ref 4.40–5.90)
RETIC COUNT ABSOLUTE: 64.8 10*3/uL (ref 19.0–183.0)
RETIC CT PCT: 2.4 % (ref 0.4–3.1)

## 2016-06-20 LAB — CBC WITH DIFFERENTIAL/PLATELET
Basophils Absolute: 0 10*3/uL (ref 0–0.1)
Basophils Relative: 1 %
Eosinophils Absolute: 0.1 10*3/uL (ref 0–0.7)
Eosinophils Relative: 1 %
HEMATOCRIT: 25 % — AB (ref 40.0–52.0)
HEMOGLOBIN: 8.1 g/dL — AB (ref 13.0–18.0)
LYMPHS ABS: 1 10*3/uL (ref 1.0–3.6)
LYMPHS PCT: 19 %
MCH: 30.6 pg (ref 26.0–34.0)
MCHC: 32.4 g/dL (ref 32.0–36.0)
MCV: 94.4 fL (ref 80.0–100.0)
MONOS PCT: 13 %
Monocytes Absolute: 0.7 10*3/uL (ref 0.2–1.0)
NEUTROS ABS: 3.5 10*3/uL (ref 1.4–6.5)
Neutrophils Relative %: 66 %
Platelets: 164 10*3/uL (ref 150–440)
RBC: 2.65 MIL/uL — AB (ref 4.40–5.90)
RDW: 15 % — ABNORMAL HIGH (ref 11.5–14.5)
WBC: 5.2 10*3/uL (ref 3.8–10.6)

## 2016-06-20 LAB — VITAMIN B12: Vitamin B-12: 225 pg/mL (ref 180–914)

## 2016-06-20 LAB — LACTATE DEHYDROGENASE: LDH: 234 U/L — ABNORMAL HIGH (ref 98–192)

## 2016-06-20 LAB — FERRITIN: Ferritin: 27 ng/mL (ref 24–336)

## 2016-06-20 NOTE — Progress Notes (Signed)
Patient here for initial visit. He is unsure why he is here. He has no complaints today.

## 2016-06-21 LAB — HAPTOGLOBIN: Haptoglobin: 273 mg/dL — ABNORMAL HIGH (ref 34–200)

## 2016-06-23 LAB — MULTIPLE MYELOMA PANEL, SERUM
ALPHA 1: 0.2 g/dL (ref 0.0–0.4)
ALPHA2 GLOB SERPL ELPH-MCNC: 0.8 g/dL (ref 0.4–1.0)
Albumin SerPl Elph-Mcnc: 3.2 g/dL (ref 2.9–4.4)
Albumin/Glob SerPl: 1.1 (ref 0.7–1.7)
B-GLOBULIN SERPL ELPH-MCNC: 1.2 g/dL (ref 0.7–1.3)
GLOBULIN, TOTAL: 3.2 g/dL (ref 2.2–3.9)
Gamma Glob SerPl Elph-Mcnc: 1 g/dL (ref 0.4–1.8)
IGG (IMMUNOGLOBIN G), SERUM: 994 mg/dL (ref 700–1600)
IGM, SERUM: 124 mg/dL (ref 15–143)
IgA: 370 mg/dL (ref 61–437)
PDF: 0
Total Protein ELP: 6.4 g/dL (ref 6.0–8.5)

## 2016-06-29 ENCOUNTER — Ambulatory Visit: Admission: RE | Admit: 2016-06-29 | Payer: Medicare Other | Source: Ambulatory Visit

## 2016-07-04 NOTE — Telephone Encounter (Signed)
I attempted to contact the pt vm states the wireless customer you are trying to call is unavailable. I'm going to mail out a letter to have the pt to contact to the office.

## 2016-07-06 ENCOUNTER — Other Ambulatory Visit: Payer: Self-pay | Admitting: *Deleted

## 2016-07-06 DIAGNOSIS — R9389 Abnormal findings on diagnostic imaging of other specified body structures: Secondary | ICD-10-CM

## 2016-07-06 DIAGNOSIS — R972 Elevated prostate specific antigen [PSA]: Secondary | ICD-10-CM

## 2016-07-07 ENCOUNTER — Encounter
Admission: RE | Admit: 2016-07-07 | Discharge: 2016-07-07 | Disposition: A | Discharge status: Home / Self Care | Payer: Medicare Other | Source: Ambulatory Visit | Attending: Oncology | Admitting: Oncology

## 2016-07-07 ENCOUNTER — Ambulatory Visit
Admission: RE | Admit: 2016-07-07 | Discharge: 2016-07-07 | Disposition: A | Discharge status: Home / Self Care | Payer: Medicare Other | Source: Ambulatory Visit | Attending: Oncology | Admitting: Oncology

## 2016-07-07 DIAGNOSIS — C7951 Secondary malignant neoplasm of bone: Secondary | ICD-10-CM | POA: Diagnosis not present

## 2016-07-07 DIAGNOSIS — R590 Localized enlarged lymph nodes: Secondary | ICD-10-CM | POA: Insufficient documentation

## 2016-07-07 DIAGNOSIS — R972 Elevated prostate specific antigen [PSA]: Secondary | ICD-10-CM | POA: Insufficient documentation

## 2016-07-07 DIAGNOSIS — I2510 Atherosclerotic heart disease of native coronary artery without angina pectoris: Secondary | ICD-10-CM | POA: Diagnosis not present

## 2016-07-07 DIAGNOSIS — R9389 Abnormal findings on diagnostic imaging of other specified body structures: Secondary | ICD-10-CM

## 2016-07-07 DIAGNOSIS — I700 Atherosclerosis of aorta: Secondary | ICD-10-CM | POA: Insufficient documentation

## 2016-07-07 DIAGNOSIS — R948 Abnormal results of function studies of other organs and systems: Secondary | ICD-10-CM | POA: Insufficient documentation

## 2016-07-07 DIAGNOSIS — R938 Abnormal findings on diagnostic imaging of other specified body structures: Secondary | ICD-10-CM | POA: Insufficient documentation

## 2016-07-07 DIAGNOSIS — K4020 Bilateral inguinal hernia, without obstruction or gangrene, not specified as recurrent: Secondary | ICD-10-CM | POA: Insufficient documentation

## 2016-07-07 DIAGNOSIS — N400 Enlarged prostate without lower urinary tract symptoms: Secondary | ICD-10-CM | POA: Diagnosis not present

## 2016-07-07 DIAGNOSIS — R911 Solitary pulmonary nodule: Secondary | ICD-10-CM | POA: Diagnosis not present

## 2016-07-07 DIAGNOSIS — I712 Thoracic aortic aneurysm, without rupture: Secondary | ICD-10-CM | POA: Insufficient documentation

## 2016-07-07 MED ORDER — TECHNETIUM TC 99M MEDRONATE IV KIT
25.0000 | PACK | Freq: Once | INTRAVENOUS | Status: AC | PRN
  Administered 2016-07-07: 21.97 via INTRAVENOUS

## 2016-07-07 MED ORDER — IOPAMIDOL (ISOVUE-300) INJECTION 61%
100.0000 mL | Freq: Once | INTRAVENOUS | Status: AC | PRN
  Administered 2016-07-07: 100 mL via INTRAVENOUS

## 2016-07-10 ENCOUNTER — Telehealth: Payer: Self-pay

## 2016-07-10 NOTE — Telephone Encounter (Signed)
-----   Message from [name], MD sent at 07/10/2016 10:24 AM EST ----- Regarding: follow up This guy needs follow up with any MD in next few weeks to discuss possible BX for elevated PSA and bone lesions.

## 2016-07-11 ENCOUNTER — Encounter: Payer: Self-pay | Admitting: Urology

## 2016-07-11 ENCOUNTER — Inpatient Hospital Stay (HOSPITAL_BASED_OUTPATIENT_CLINIC_OR_DEPARTMENT_OTHER): Payer: Medicare Other | Admitting: Oncology

## 2016-07-11 VITALS — BP 142/64 | HR 64 | Temp 96.4°F | Resp 18 | Wt 225.4 lb

## 2016-07-11 DIAGNOSIS — R911 Solitary pulmonary nodule: Secondary | ICD-10-CM

## 2016-07-11 DIAGNOSIS — C7951 Secondary malignant neoplasm of bone: Secondary | ICD-10-CM

## 2016-07-11 DIAGNOSIS — C801 Malignant (primary) neoplasm, unspecified: Secondary | ICD-10-CM

## 2016-07-11 DIAGNOSIS — D649 Anemia, unspecified: Secondary | ICD-10-CM

## 2016-07-11 DIAGNOSIS — R972 Elevated prostate specific antigen [PSA]: Secondary | ICD-10-CM | POA: Diagnosis not present

## 2016-07-11 DIAGNOSIS — R9389 Abnormal findings on diagnostic imaging of other specified body structures: Secondary | ICD-10-CM

## 2016-07-11 DIAGNOSIS — F17210 Nicotine dependence, cigarettes, uncomplicated: Secondary | ICD-10-CM

## 2016-07-11 NOTE — Progress Notes (Signed)
Here for follow up. Complains of back pain-told he has disc issues . Today c/o pain going down legs

## 2016-07-11 NOTE — Progress Notes (Signed)
Hematology/Oncology Consult note [facility]  Telephone:([phone] [phone]  Patient Care Team: [name], MD as PCP - General (Family Medicine)   Name of the patient: Michael Mathews  [id number]  04-27-1946   Date of visit: 07/11/16  Diagnosis- likely metastatic prostate cancer biopsy pending  Chief complaint/ Reason for visit- discuss results of scans  Heme/Onc history: 1. patient is 71 year old male with a past medical history significant for hypertension diabetes and COPD among other medical problems. He was admitted to the hospital in October 2017 with some symptoms of dizziness and abdominal pain. CT angiogram abdomen incidentally showed sclerotic metastatic disease throughout the visualized spine.   2. This was followed by an MRI of the lumbar spine which showed multifocal T1 and T2 hypointense lesions with areas of increased signal on STIR sequence are seen in multiple vertebral bodies. Largest lesions are in T11 T12 L1 and in the imaged sacrum. This is consistent with metastatic disease. Primary lesion is not identified. MRI brain showed no acute intracranial abnormality.   3. Patient was noted to have an elevated PSA and was referred to urology as an outpatient. He was recently seen by Dr. Tresa Moore on 05/29/2016. PSA was repeated at that time which came back elevated at 10.3. 3 months over the value was 10.81.  4. Bone scan on 07/07/16 showed : Widespread radiotracer uptake over the axial and appendicular skeleton as described compatible with metastatic disease and correlating with sclerotic lesions on Ct. CT chest on 07/07/16 showed:  Scattered sclerotic osseous metastatic lesions throughout spine, pelvis, proximal femora, ribs, and manubrium.  Prostatic enlargement with thickened bladder wall question related to chronic bladder outlet obstruction.  Spiculated 12 x 11 x 7 mm LEFT upper lobe nodule question primary pulmonary neoplasm. Stable  nonspecific small LEFT adrenal nodule. Single minimally enlarged AP window lymph node. BILATERAL inguinal hernias containing fat. 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 Recommend annual imaging followup by CTA or MRA.   5. Anemia work up from 06/20/16 was as follows: CBC showed white count of 5.2, H&H of 8.1/25 and a platelet count of 164. CMP showed elevated alkaline phosphatase of 391. Multiple myeloma panel showed normal quantitative immunoglobulins and no monoclonal M protein. Vitamin B12 level was low normal at 225. LDH was mildly elevated at 234. Ferritin was low normal at 27. Reticulocyte count was 2.4% inappropriately low for the degree of anemia. Haptoglobin was elevated at 273.  Interval history- He reports doing well. Able to ambulate and he is independent of ADL's and IADL's. Denies any pain  ECOG PS- 1 Pain scale- 0   Review of systems- Review of Systems  Constitutional: Positive for malaise/fatigue. Negative for chills, fever and weight loss.  HENT: Negative for congestion, ear discharge and nosebleeds.   Eyes: Negative for blurred vision.  Respiratory: Negative for cough, hemoptysis, sputum production, shortness of breath and wheezing.   Cardiovascular: Negative for chest pain, palpitations, orthopnea and claudication.  Gastrointestinal: Negative for abdominal pain, blood in stool, constipation, diarrhea, heartburn, melena, nausea and vomiting.  Genitourinary: Negative for dysuria, flank pain, frequency, hematuria and urgency.  Musculoskeletal: Negative for back pain, joint pain and myalgias.  Skin: Negative for rash.  Neurological: Negative for dizziness, tingling, focal weakness, seizures, weakness and headaches.  Endo/Heme/Allergies: Does not bruise/bleed easily.  Psychiatric/Behavioral: Negative for depression and suicidal ideas. The patient does not have insomnia.  Current treatment- yet to start  No Known Allergies   Past Medical History:  Diagnosis Date  . Arthritis   . Asthma   . COPD (chronic obstructive pulmonary disease) ([place])   . Cough    chronic  . Diabetes mellitus without complication ([place])   . Edema   . Elevated PSA   . Hypertension   . Orthopnea   . Oxygen deficit    o2 prn  . Shortness of breath dyspnea   . Sleep apnea    cpap     Past Surgical History:  Procedure Laterality Date  . CARDIAC CATHETERIZATION    . COLONOSCOPY    . EYE SURGERY    . KNEE ARTHROSCOPY      Social History   Social History  . Marital status: Unknown    Spouse name: N/A  . Number of children: N/A  . Years of education: N/A   Occupational History  . Not on file.   Social History Main Topics  . Smoking status: Current Every Day Smoker    Packs/day: 0.50    Years: 55.00  . Smokeless tobacco: Never Used  . Alcohol use No  . Drug use: No  . Sexual activity: Not on file   Other Topics Concern  . Not on file   Social History Narrative  . No narrative on file    Family History  Problem Relation Age of Onset  . CVA Mother   . CAD Father      Current Outpatient Prescriptions:  .  aspirin EC 325 MG tablet, Take 325 mg by mouth every morning., Disp: , Rfl:  .  atorvastatin (LIPITOR) 40 MG tablet, Take 1 tablet (40 mg total) by mouth daily., Disp: 30 tablet, Rfl: 0 .  DULoxetine (CYMBALTA) 30 MG capsule, Take 30 mg by mouth daily., Disp: , Rfl:  .  finasteride (PROSCAR) 5 MG tablet, Take 1 tablet (5 mg total) by mouth daily., Disp: 90 tablet, Rfl: 3 .  Fluticasone-Salmeterol (ADVAIR) 250-50 MCG/DOSE AEPB, Inhale 1 puff into the lungs 2 (two) times daily., Disp: , Rfl:  .  GLIPIZIDE XL 10 MG 24 hr tablet, Take 20 mg by mouth daily., Disp: , Rfl:  .  hydrALAZINE (APRESOLINE) 100 MG tablet, , Disp: , Rfl:  .  losartan (COZAAR) 100 MG tablet, Take 100 mg by mouth daily., Disp: , Rfl:  .  metFORMIN (GLUCOPHAGE) 500 MG tablet,  Take 500 mg by mouth 2 (two) times daily with a meal., Disp: , Rfl:  .  metoprolol (LOPRESSOR) 50 MG tablet, Take 50 mg by mouth 2 (two) times daily., Disp: , Rfl:  .  nicotine (NICODERM CQ - DOSED IN MG/24 HOURS) 14 mg/24hr patch, Place 1 patch (14 mg total) onto the skin daily. (Patient not taking: Reported on 06/20/2016), Disp: 28 patch, Rfl: 0 .  tamsulosin (FLOMAX) 0.4 MG CAPS capsule, Take 1 capsule (0.4 mg total) by mouth daily., Disp: 90 capsule, Rfl: 3 .  terazosin (HYTRIN) 10 MG capsule, , Disp: , Rfl:   Physical exam:  Vitals:   07/11/16 1341  BP: (!) 142/64  Pulse: 64  Resp: 18  Temp: (!) 96.4 F (35.8 C)  TempSrc: Tympanic  Weight: 225 lb 6.7 oz (102.3 kg)   Physical Exam  Constitutional: He is oriented to person, place, and time and well-developed, well-nourished, and in no distress.  HENT:  Head: Normocephalic and atraumatic.  Eyes: EOM are normal. Pupils are equal, round, and reactive to light.  Neck:  Normal range of motion.  Cardiovascular: Normal rate, regular rhythm and normal heart sounds.   Pulmonary/Chest: Effort normal and breath sounds normal.  Abdominal: Soft. Bowel sounds are normal.  Neurological: He is alert and oriented to person, place, and time.  Skin: Skin is warm and dry.     CMP Latest Ref Rng & Units 06/20/2016  Glucose 65 - 99 mg/dL 113(H)  BUN 6 - 20 mg/dL 11  Creatinine 0.61 - 1.24 mg/dL 0.76  Sodium 135 - 145 mmol/L 139  Potassium 3.5 - 5.1 mmol/L 3.7  Chloride 101 - 111 mmol/L 104  CO2 22 - 32 mmol/L 27  Calcium 8.9 - 10.3 mg/dL 8.4(L)  Total Protein 6.5 - 8.1 g/dL 7.0  Total Bilirubin 0.3 - 1.2 mg/dL 0.5  Alkaline Phos 38 - 126 U/L 391(H)  AST 15 - 41 U/L 19  ALT 17 - 63 U/L 10(L)   CBC Latest Ref Rng & Units 06/20/2016  WBC 3.8 - 10.6 K/uL 5.2  Hemoglobin 13.0 - 18.0 g/dL 8.1(L)  Hematocrit 40.0 - 52.0 % 25.0(L)  Platelets 150 - 440 K/uL 164    No images are attached to the encounter.  Ct Chest W Contrast  Result Date:  07/07/2016 CLINICAL DATA:  Smoker, abnormal bone lesions on prior CT scan, elevated PSA, diabetes mellitus, hypertension, question metastatic disease EXAM: CT CHEST, ABDOMEN, AND PELVIS WITH CONTRAST TECHNIQUE: Multidetector CT imaging of the chest, abdomen and pelvis was performed following the standard protocol during bolus administration of intravenous contrast. Sagittal and coronal MPR images reconstructed from axial data set. CONTRAST:  175m ISOVUE-300 IOPAMIDOL (ISOVUE-300) INJECTION 61% IV. Dilute oral contrast. COMPARISON:  None. CT abdomen and pelvis 03/04/2016, CT chest 08/16/2008 FINDINGS: CT CHEST FINDINGS Cardiovascular: Atherosclerotic calcifications aorta, proximal great vessels, and coronary arteries. Small pericardial effusion. Aneurysmal dilatation of ascending thoracic aorta 4.3 cm transverse image 29 unchanged. Mediastinum/Nodes: Base of cervical region normal appearance. Scattered normal sized mediastinal and hilar lymph nodes. Single enlarged AP window node 12 mm short axis image 22. Calcified nodes at RIGHT hilum. No definite esophageal abnormalities. Lungs/Pleura: Multiple calcified granulomata RIGHT lung. Atelectasis in LEFT lower lobe with peribronchial thickening and question mucous plugging. Slightly spiculated mass identified in the LEFT upper lobe adjacent to the major fissure, 12 x 11 x 7 mm, concerning for a primary pulmonary neoplasm. Musculoskeletal: Numerous sclerotic osseous metastatic lesions are seen throughout the spine, ribs and manubrium. CT ABDOMEN PELVIS FINDINGS Hepatobiliary: Gallbladder and liver normal appearance Pancreas: Normal appearance Spleen: Tiny calcified granulomata within spleen. Otherwise normal appearance. Adrenals/Urinary Tract: Nonspecific 13 x 8 mm LEFT adrenal nodule, unchanged. Calcifications in both kidneys many renovascular, question tiny nonobstructing calculi as well. Adrenal glands, kidneys, and ureters normal appearance. Mild thickening of the  bladder wall question related to chronic outlet obstruction, with prostate gland mildly enlarged at 4.4 x 3.7 cm. Stomach/Bowel: Normal appendix. Stomach and bowel loops normal appearance. Vascular/Lymphatic: Atherosclerotic calcifications aorta and iliac arteries. Prominent aorta at aortic hiatus, 3.9 x 3.9 cm. No adenopathy. Reproductive: N/A Other: BILATERAL inguinal hernias containing fat. No free air free fluid. Musculoskeletal: Scattered sclerotic osseous metastatic lesions in spine and pelvis as well as proximal femora bilaterally. IMPRESSION: Scattered sclerotic osseous metastatic lesions throughout spine, pelvis, proximal femora, ribs, and manubrium. Prostatic enlargement with thickened bladder wall question related to chronic bladder outlet obstruction. Spiculated 12 x 11 x 7 mm LEFT upper lobe nodule question primary pulmonary neoplasm. Stable nonspecific small LEFT adrenal nodule. Single minimally enlarged AP window lymph  node. BILATERAL inguinal hernias containing fat. 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 Recommend annual imaging followup by CTA or MRA. This recommendation follows 2010 ACCF/AHA/AATS/ACR/ASA/SCA/SCAI/SIR/STS/SVM Guidelines for the Diagnosis and Management of Patients with Thoracic Aortic Disease. Circulation. 2010; 121: V494-W967 Electronically Signed   By: Lavonia Dana M.D.   On: 07/07/2016 17:16   Nm Bone Scan Whole Body  Result Date: 07/07/2016 CLINICAL DATA:  Sclerotic bone lesions on CT October 2017. Low back pain and right scapula/upper back pain. No known history of cancer. No injury. EXAM: NUCLEAR MEDICINE WHOLE BODY BONE SCAN TECHNIQUE: Whole body anterior and posterior images were obtained approximately 3 hours after intravenous injection of radiopharmaceutical. RADIOPHARMACEUTICALS:  21.97 mCi Technetium-40mMDP IV COMPARISON:  CT 07/07/2016 and 03/04/2016  FINDINGS: Examination demonstrates multiple foci of increased radiotracer activity throughout the thoracolumbar spine, anterior and posterior ribs, bilateral humerus and bilateral femurs and sternal manubrial junction. Multiple foci of uptake over the sacrum/ pelvic bones most prominent over the midline sacrum and right ischium. Subtle focus of uptake over the upper cervical spine and left skullbase. These findings correlate with sclerotic lesions on CT and are likely due to metastatic disease. IMPRESSION: Widespread radiotracer uptake over the axial and appendicular skeleton as described compatible with metastatic disease and correlating with sclerotic lesions on CT. Electronically Signed   By: DMarin OlpM.D.   On: 07/07/2016 16:31   Ct Abdomen Pelvis W Contrast  Result Date: 07/07/2016 CLINICAL DATA:  Smoker, abnormal bone lesions on prior CT scan, elevated PSA, diabetes mellitus, hypertension, question metastatic disease EXAM: CT CHEST, ABDOMEN, AND PELVIS WITH CONTRAST TECHNIQUE: Multidetector CT imaging of the chest, abdomen and pelvis was performed following the standard protocol during bolus administration of intravenous contrast. Sagittal and coronal MPR images reconstructed from axial data set. CONTRAST:  1088mISOVUE-300 IOPAMIDOL (ISOVUE-300) INJECTION 61% IV. Dilute oral contrast. COMPARISON:  None. CT abdomen and pelvis 03/04/2016, CT chest 08/16/2008 FINDINGS: CT CHEST FINDINGS Cardiovascular: Atherosclerotic calcifications aorta, proximal great vessels, and coronary arteries. Small pericardial effusion. Aneurysmal dilatation of ascending thoracic aorta 4.3 cm transverse image 29 unchanged. Mediastinum/Nodes: Base of cervical region normal appearance. Scattered normal sized mediastinal and hilar lymph nodes. Single enlarged AP window node 12 mm short axis image 22. Calcified nodes at RIGHT hilum. No definite esophageal abnormalities. Lungs/Pleura: Multiple calcified granulomata RIGHT lung.  Atelectasis in LEFT lower lobe with peribronchial thickening and question mucous plugging. Slightly spiculated mass identified in the LEFT upper lobe adjacent to the major fissure, 12 x 11 x 7 mm, concerning for a primary pulmonary neoplasm. Musculoskeletal: Numerous sclerotic osseous metastatic lesions are seen throughout the spine, ribs and manubrium. CT ABDOMEN PELVIS FINDINGS Hepatobiliary: Gallbladder and liver normal appearance Pancreas: Normal appearance Spleen: Tiny calcified granulomata within spleen. Otherwise normal appearance. Adrenals/Urinary Tract: Nonspecific 13 x 8 mm LEFT adrenal nodule, unchanged. Calcifications in both kidneys many renovascular, question tiny nonobstructing calculi as well. Adrenal glands, kidneys, and ureters normal appearance. Mild thickening of the bladder wall question related to chronic outlet obstruction, with prostate gland mildly enlarged at 4.4 x 3.7 cm. Stomach/Bowel: Normal appendix. Stomach and bowel loops normal appearance. Vascular/Lymphatic: Atherosclerotic calcifications aorta and iliac arteries. Prominent aorta at aortic hiatus, 3.9 x 3.9 cm. No adenopathy. Reproductive: N/A Other: BILATERAL inguinal hernias containing fat. No free air free fluid. Musculoskeletal: Scattered sclerotic osseous metastatic lesions in spine and pelvis as well as proximal femora bilaterally. IMPRESSION: Scattered sclerotic osseous metastatic lesions  throughout spine, pelvis, proximal femora, ribs, and manubrium. Prostatic enlargement with thickened bladder wall question related to chronic bladder outlet obstruction. Spiculated 12 x 11 x 7 mm LEFT upper lobe nodule question primary pulmonary neoplasm. Stable nonspecific small LEFT adrenal nodule. Single minimally enlarged AP window lymph node. BILATERAL inguinal hernias containing fat. 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  Recommend annual imaging followup by CTA or MRA. This recommendation follows 2010 ACCF/AHA/AATS/ACR/ASA/SCA/SCAI/SIR/STS/SVM Guidelines for the Diagnosis and Management of Patients with Thoracic Aortic Disease. Circulation. 2010; 121: [id number] Electronically Signed   By: Lavonia Dana M.D.   On: 07/07/2016 17:16     Assessment and plan- Patient is a 71 y.o. male who was referred to [place] for sclerotic lesions in his bone presumptively from metastatic prostate cancer  1. We got in touch with Dr. Tresa Moore patients primary urologist yesterday. I also spoke to IR and they feel that prostate biopsy would be less invasive and a safer option as compared to bone biopsy. I do feel that patients performance status permits getting a biopsy and prostate biopsy will give [place] more information including gleasons score which can give [place] information on the biology of his disease. Patient will be seeing Dr. Tresa Moore in 2 weeks time to discuss this further. I do think he would atleast be a candidate for ADT if this were to be prostate cancer. He does have extensive bone mets and he may be a candidate for Zytiga in addition to ADT. Given his age, PS and co morbidities I would prefer to use zytiga over docetaxel in his case. I will discuss this further after he sees Dr. Tresa Moore. If prostate biopsy cannot be performed, I will consider getting a bone biopsy. Patient himself desires more work up to come to a diagnosis.  2. Patient was also found to have spiculated LUL lung lesion concerning for primary lung neoplasm which is likely separate from his prostate cancer diagnosis. I will proceed with CT guided biopsy of the same  3. Patient does have normocytic anemia which is likely multifactorial secondary to IDA and anemia of chronic disease from underlying prostate cancer. I have advised him to start taking oral iron 325 mg BID. He is also to take multivitamin given low normal Vitamin B 12 levels   4. I will see him back in 3 weeks time with  labs and hopefully he would have had his lung and prostate/or bone biopsy by then.   Total face to face encounter time for this patient visit was 30 min. >50% of the time was  spent in counseling and coordination of care.   Visit Diagnosis 1. Elevated PSA   2. Abnormal CT scan   3. Normocytic anemia      Dr. Randa Evens, MD, MPH [place] [facility] Pager- [phone] 07/11/2016 4:10 PM

## 2016-07-11 NOTE — Telephone Encounter (Signed)
Could not reach the pt to schd his follow up app could not lm on phone. I sent a letter with his app and mailed it to him.   Sharyn Lull

## 2016-07-13 ENCOUNTER — Other Ambulatory Visit: Payer: Self-pay | Admitting: *Deleted

## 2016-07-13 DIAGNOSIS — R9389 Abnormal findings on diagnostic imaging of other specified body structures: Secondary | ICD-10-CM

## 2016-07-13 DIAGNOSIS — R972 Elevated prostate specific antigen [PSA]: Secondary | ICD-10-CM

## 2016-07-18 ENCOUNTER — Telehealth: Payer: Self-pay | Admitting: *Deleted

## 2016-07-18 NOTE — Telephone Encounter (Signed)
Pt came in today and he was told that after the doctors spoke about his case, we are no longer doing a lung bx since they felt like he was high risk for lung bx with breathing issues and other health reasons. They have agreed to bx bone and pt agreeable and given date of bx 2/12 [facility] 7:30 for procedure for 8:30 .  NPO after midnight and sip of water with meds the morning of procedure.  Also he must have driver to take him home.  He is agreeable to this. Also he will have f/u with [name] 2/20 at 9:30

## 2016-07-20 ENCOUNTER — Other Ambulatory Visit: Payer: Self-pay | Admitting: General Surgery

## 2016-07-24 ENCOUNTER — Ambulatory Visit
Admission: RE | Admit: 2016-07-24 | Discharge: 2016-07-24 | Disposition: A | Discharge status: Home / Self Care | Payer: Medicare Other | Source: Ambulatory Visit | Attending: Oncology | Admitting: Oncology

## 2016-07-24 DIAGNOSIS — G4730 Sleep apnea, unspecified: Secondary | ICD-10-CM | POA: Diagnosis not present

## 2016-07-24 DIAGNOSIS — R609 Edema, unspecified: Secondary | ICD-10-CM | POA: Diagnosis not present

## 2016-07-24 DIAGNOSIS — R9389 Abnormal findings on diagnostic imaging of other specified body structures: Secondary | ICD-10-CM

## 2016-07-24 DIAGNOSIS — C61 Malignant neoplasm of prostate: Secondary | ICD-10-CM | POA: Diagnosis not present

## 2016-07-24 DIAGNOSIS — I10 Essential (primary) hypertension: Secondary | ICD-10-CM | POA: Diagnosis not present

## 2016-07-24 DIAGNOSIS — E119 Type 2 diabetes mellitus without complications: Secondary | ICD-10-CM | POA: Insufficient documentation

## 2016-07-24 DIAGNOSIS — J449 Chronic obstructive pulmonary disease, unspecified: Secondary | ICD-10-CM | POA: Diagnosis not present

## 2016-07-24 DIAGNOSIS — Z7984 Long term (current) use of oral hypoglycemic drugs: Secondary | ICD-10-CM | POA: Diagnosis not present

## 2016-07-24 DIAGNOSIS — R972 Elevated prostate specific antigen [PSA]: Secondary | ICD-10-CM | POA: Insufficient documentation

## 2016-07-24 DIAGNOSIS — R0902 Hypoxemia: Secondary | ICD-10-CM | POA: Insufficient documentation

## 2016-07-24 DIAGNOSIS — M899 Disorder of bone, unspecified: Secondary | ICD-10-CM | POA: Insufficient documentation

## 2016-07-24 DIAGNOSIS — C7951 Secondary malignant neoplasm of bone: Secondary | ICD-10-CM | POA: Insufficient documentation

## 2016-07-24 DIAGNOSIS — F17210 Nicotine dependence, cigarettes, uncomplicated: Secondary | ICD-10-CM | POA: Insufficient documentation

## 2016-07-24 DIAGNOSIS — R938 Abnormal findings on diagnostic imaging of other specified body structures: Secondary | ICD-10-CM | POA: Insufficient documentation

## 2016-07-24 DIAGNOSIS — Z7982 Long term (current) use of aspirin: Secondary | ICD-10-CM | POA: Insufficient documentation

## 2016-07-24 LAB — CBC
HCT: 24.9 % — ABNORMAL LOW (ref 40.0–52.0)
HEMOGLOBIN: 8.3 g/dL — AB (ref 13.0–18.0)
MCH: 30.7 pg (ref 26.0–34.0)
MCHC: 33.4 g/dL (ref 32.0–36.0)
MCV: 92.1 fL (ref 80.0–100.0)
Platelets: 179 10*3/uL (ref 150–440)
RBC: 2.7 MIL/uL — AB (ref 4.40–5.90)
RDW: 15.1 % — ABNORMAL HIGH (ref 11.5–14.5)
WBC: 7.3 10*3/uL (ref 3.8–10.6)

## 2016-07-24 LAB — PROTIME-INR
INR: 1.07
PROTHROMBIN TIME: 13.9 s (ref 11.4–15.2)

## 2016-07-24 LAB — GLUCOSE, CAPILLARY: Glucose-Capillary: 116 mg/dL — ABNORMAL HIGH (ref 65–99)

## 2016-07-24 LAB — APTT: aPTT: 32 seconds (ref 24–36)

## 2016-07-24 MED ORDER — MIDAZOLAM HCL 5 MG/5ML IJ SOLN
INTRAMUSCULAR | Status: AC | PRN
  Administered 2016-07-24 (×2): 1 mg via INTRAVENOUS

## 2016-07-24 MED ORDER — SODIUM CHLORIDE 0.9 % IV SOLN
INTRAVENOUS | Status: DC
  Administered 2016-07-24: 09:00:00 via INTRAVENOUS

## 2016-07-24 MED ORDER — FENTANYL CITRATE (PF) 100 MCG/2ML IJ SOLN
INTRAMUSCULAR | Status: AC | PRN
  Administered 2016-07-24: 25 ug via INTRAVENOUS
  Administered 2016-07-24 (×2): 12.5 ug via INTRAVENOUS
  Administered 2016-07-24: 50 ug via INTRAVENOUS

## 2016-07-24 NOTE — Procedures (Signed)
Interventional Radiology Procedure Note  Procedure: CT guided bone biopsy of sclerotic L1 lesion.  Mx bone bx's.   Complications: None Recommendations:  - 1 hour obs - Ok to shower tomorrow - Do not submerge for 7 days - Routine care   Signed,  Dulcy Fanny. Earleen Newport, DO

## 2016-07-24 NOTE — H&P (Signed)
Chief Complaint: Patient was seen in consultation today for No chief complaint on file.  at the request of [place] [facility]  Referring Physician(s): [place] [facility]  Supervising Physician: Corrie Mckusick  Patient Status: [facility] - Out-pt  History of Present Illness: Michael Mathews is a 71 y.o. male presenting today for a targeted bone lesion biopsy, with suspicion of metastatic prostate CA.     Past Medical History:  Diagnosis Date  . Arthritis   . Asthma   . COPD (chronic obstructive pulmonary disease) ([place])   . Cough    chronic  . Diabetes mellitus without complication ([place])   . Edema   . Elevated PSA   . Hypertension   . Orthopnea   . Oxygen deficit    o2 prn  . Shortness of breath dyspnea   . Sleep apnea    cpap    Past Surgical History:  Procedure Laterality Date  . CARDIAC CATHETERIZATION    . COLONOSCOPY    . EYE SURGERY    . KNEE ARTHROSCOPY      Allergies: Patient has no known allergies.  Medications: Prior to Admission medications   Medication Sig Start Date End Date Taking? Authorizing Provider  aspirin EC 325 MG tablet Take 325 mg by mouth every morning.   Yes Historical Provider, MD  atorvastatin (LIPITOR) 40 MG tablet Take 1 tablet (40 mg total) by mouth daily. 03/24/16  Yes Sital Mody, MD  Fluticasone-Salmeterol (ADVAIR) 250-50 MCG/DOSE AEPB Inhale 1 puff into the lungs 2 (two) times daily.   Yes Historical Provider, MD  desonide (DESOWEN) 0.05 % cream  07/05/16   Historical Provider, MD  DULoxetine (CYMBALTA) 30 MG capsule Take 30 mg by mouth daily. 01/04/16   Historical Provider, MD  finasteride (PROSCAR) 5 MG tablet Take 1 tablet (5 mg total) by mouth daily. 05/29/16 05/29/17  [name], MD  GLIPIZIDE XL 10 MG 24 hr tablet Take 20 mg by mouth daily. 01/19/16   Historical Provider, MD  hydrALAZINE (APRESOLINE) 100 MG tablet  05/31/16   Historical Provider, MD  losartan (COZAAR) 100 MG tablet Take 100 mg by mouth daily.    Historical Provider, MD    metFORMIN (GLUCOPHAGE) 500 MG tablet Take 500 mg by mouth 2 (two) times daily with a meal.    Historical Provider, MD  metoprolol (LOPRESSOR) 50 MG tablet Take 50 mg by mouth 2 (two) times daily.    Historical Provider, MD  nicotine (NICODERM CQ - DOSED IN MG/24 HOURS) 14 mg/24hr patch Place 1 patch (14 mg total) onto the skin daily. Patient not taking: Reported on 07/11/2016 03/25/16   Bettey Costa, MD  tamsulosin (FLOMAX) 0.4 MG CAPS capsule Take 1 capsule (0.4 mg total) by mouth daily. 05/29/16   [name], MD  terazosin (HYTRIN) 10 MG capsule  05/31/16   Historical Provider, MD     Family History  Problem Relation Age of Onset  . CVA Mother   . CAD Father     Social History   Social History  . Marital status: Unknown    Spouse name: N/A  . Number of children: N/A  . Years of education: N/A   Social History Main Topics  . Smoking status: Current Every Day Smoker    Packs/day: 0.50    Years: 55.00  . Smokeless tobacco: Never Used  . Alcohol use No  . Drug use: No  . Sexual activity: Not Asked   Other Topics Concern  . None   Social  History Narrative  . None      Review of Systems: A 12 point ROS discussed and pertinent positives are indicated in the HPI above.  All other systems are negative.  Review of Systems  Vital Signs: BP (!) 129/51   Pulse 66   Temp 98 F (36.7 C) (Oral)   Resp 13   Ht 5\' 10"  (1.778 m)   SpO2 93%   Physical Exam  Mallampati Score:  2  Imaging: Ct Chest W Contrast  Result Date: 07/07/2016 CLINICAL DATA:  Smoker, abnormal bone lesions on prior CT scan, elevated PSA, diabetes mellitus, hypertension, question metastatic disease EXAM: CT CHEST, ABDOMEN, AND PELVIS WITH CONTRAST TECHNIQUE: Multidetector CT imaging of the chest, abdomen and pelvis was performed following the standard protocol during bolus administration of intravenous contrast. Sagittal and coronal MPR images reconstructed from axial data set. CONTRAST:  133mL  ISOVUE-300 IOPAMIDOL (ISOVUE-300) INJECTION 61% IV. Dilute oral contrast. COMPARISON:  None. CT abdomen and pelvis 03/04/2016, CT chest 08/16/2008 FINDINGS: CT CHEST FINDINGS Cardiovascular: Atherosclerotic calcifications aorta, proximal great vessels, and coronary arteries. Small pericardial effusion. Aneurysmal dilatation of ascending thoracic aorta 4.3 cm transverse image 29 unchanged. Mediastinum/Nodes: Base of cervical region normal appearance. Scattered normal sized mediastinal and hilar lymph nodes. Single enlarged AP window node 12 mm short axis image 22. Calcified nodes at RIGHT hilum. No definite esophageal abnormalities. Lungs/Pleura: Multiple calcified granulomata RIGHT lung. Atelectasis in LEFT lower lobe with peribronchial thickening and question mucous plugging. Slightly spiculated mass identified in the LEFT upper lobe adjacent to the major fissure, 12 x 11 x 7 mm, concerning for a primary pulmonary neoplasm. Musculoskeletal: Numerous sclerotic osseous metastatic lesions are seen throughout the spine, ribs and manubrium. CT ABDOMEN PELVIS FINDINGS Hepatobiliary: Gallbladder and liver normal appearance Pancreas: Normal appearance Spleen: Tiny calcified granulomata within spleen. Otherwise normal appearance. Adrenals/Urinary Tract: Nonspecific 13 x 8 mm LEFT adrenal nodule, unchanged. Calcifications in both kidneys many renovascular, question tiny nonobstructing calculi as well. Adrenal glands, kidneys, and ureters normal appearance. Mild thickening of the bladder wall question related to chronic outlet obstruction, with prostate gland mildly enlarged at 4.4 x 3.7 cm. Stomach/Bowel: Normal appendix. Stomach and bowel loops normal appearance. Vascular/Lymphatic: Atherosclerotic calcifications aorta and iliac arteries. Prominent aorta at aortic hiatus, 3.9 x 3.9 cm. No adenopathy. Reproductive: N/A Other: BILATERAL inguinal hernias containing fat. No free air free fluid. Musculoskeletal: Scattered  sclerotic osseous metastatic lesions in spine and pelvis as well as proximal femora bilaterally. IMPRESSION: Scattered sclerotic osseous metastatic lesions throughout spine, pelvis, proximal femora, ribs, and manubrium. Prostatic enlargement with thickened bladder wall question related to chronic bladder outlet obstruction. Spiculated 12 x 11 x 7 mm LEFT upper lobe nodule question primary pulmonary neoplasm. Stable nonspecific small LEFT adrenal nodule. Single minimally enlarged AP window lymph node. BILATERAL inguinal hernias containing fat. 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 Recommend annual imaging followup by CTA or MRA. This recommendation follows 2010 ACCF/AHA/AATS/ACR/ASA/SCA/SCAI/SIR/STS/SVM Guidelines for the Diagnosis and Management of Patients with Thoracic Aortic Disease. Circulation. 2010; [id number] Electronically Signed   By: Lavonia Dana M.D.   On: 07/07/2016 17:16   Nm Bone Scan Whole Body  Result Date: 07/07/2016 CLINICAL DATA:  Sclerotic bone lesions on CT October 2017. Low back pain and right scapula/upper back pain. No known history of cancer. No injury. EXAM: NUCLEAR MEDICINE WHOLE BODY BONE SCAN TECHNIQUE: Whole body anterior and posterior images were obtained approximately 3 hours  after intravenous injection of radiopharmaceutical. RADIOPHARMACEUTICALS:  21.97 mCi Technetium-12m MDP IV COMPARISON:  CT 07/07/2016 and 03/04/2016 FINDINGS: Examination demonstrates multiple foci of increased radiotracer activity throughout the thoracolumbar spine, anterior and posterior ribs, bilateral humerus and bilateral femurs and sternal manubrial junction. Multiple foci of uptake over the sacrum/ pelvic bones most prominent over the midline sacrum and right ischium. Subtle focus of uptake over the upper cervical spine and left skullbase. These findings correlate with sclerotic lesions on  CT and are likely due to metastatic disease. IMPRESSION: Widespread radiotracer uptake over the axial and appendicular skeleton as described compatible with metastatic disease and correlating with sclerotic lesions on CT. Electronically Signed   By: Marin Olp M.D.   On: 07/07/2016 16:31   Ct Abdomen Pelvis W Contrast  Result Date: 07/07/2016 CLINICAL DATA:  Smoker, abnormal bone lesions on prior CT scan, elevated PSA, diabetes mellitus, hypertension, question metastatic disease EXAM: CT CHEST, ABDOMEN, AND PELVIS WITH CONTRAST TECHNIQUE: Multidetector CT imaging of the chest, abdomen and pelvis was performed following the standard protocol during bolus administration of intravenous contrast. Sagittal and coronal MPR images reconstructed from axial data set. CONTRAST:  138mL ISOVUE-300 IOPAMIDOL (ISOVUE-300) INJECTION 61% IV. Dilute oral contrast. COMPARISON:  None. CT abdomen and pelvis 03/04/2016, CT chest 08/16/2008 FINDINGS: CT CHEST FINDINGS Cardiovascular: Atherosclerotic calcifications aorta, proximal great vessels, and coronary arteries. Small pericardial effusion. Aneurysmal dilatation of ascending thoracic aorta 4.3 cm transverse image 29 unchanged. Mediastinum/Nodes: Base of cervical region normal appearance. Scattered normal sized mediastinal and hilar lymph nodes. Single enlarged AP window node 12 mm short axis image 22. Calcified nodes at RIGHT hilum. No definite esophageal abnormalities. Lungs/Pleura: Multiple calcified granulomata RIGHT lung. Atelectasis in LEFT lower lobe with peribronchial thickening and question mucous plugging. Slightly spiculated mass identified in the LEFT upper lobe adjacent to the major fissure, 12 x 11 x 7 mm, concerning for a primary pulmonary neoplasm. Musculoskeletal: Numerous sclerotic osseous metastatic lesions are seen throughout the spine, ribs and manubrium. CT ABDOMEN PELVIS FINDINGS Hepatobiliary: Gallbladder and liver normal appearance Pancreas: Normal  appearance Spleen: Tiny calcified granulomata within spleen. Otherwise normal appearance. Adrenals/Urinary Tract: Nonspecific 13 x 8 mm LEFT adrenal nodule, unchanged. Calcifications in both kidneys many renovascular, question tiny nonobstructing calculi as well. Adrenal glands, kidneys, and ureters normal appearance. Mild thickening of the bladder wall question related to chronic outlet obstruction, with prostate gland mildly enlarged at 4.4 x 3.7 cm. Stomach/Bowel: Normal appendix. Stomach and bowel loops normal appearance. Vascular/Lymphatic: Atherosclerotic calcifications aorta and iliac arteries. Prominent aorta at aortic hiatus, 3.9 x 3.9 cm. No adenopathy. Reproductive: N/A Other: BILATERAL inguinal hernias containing fat. No free air free fluid. Musculoskeletal: Scattered sclerotic osseous metastatic lesions in spine and pelvis as well as proximal femora bilaterally. IMPRESSION: Scattered sclerotic osseous metastatic lesions throughout spine, pelvis, proximal femora, ribs, and manubrium. Prostatic enlargement with thickened bladder wall question related to chronic bladder outlet obstruction. Spiculated 12 x 11 x 7 mm LEFT upper lobe nodule question primary pulmonary neoplasm. Stable nonspecific small LEFT adrenal nodule. Single minimally enlarged AP window lymph node. BILATERAL inguinal hernias containing fat. 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 Recommend annual imaging followup by CTA or MRA. This recommendation follows 2010 ACCF/AHA/AATS/ACR/ASA/SCA/SCAI/SIR/STS/SVM Guidelines for the Diagnosis and Management of Patients with Thoracic Aortic Disease. Circulation. 2010; [id number] Electronically Signed   By: Lavonia Dana M.D.   On: 07/07/2016 17:16    Labs:  CBC:  Recent Labs  03/24/16 0007 04/13/16 0724 06/20/16 1410 07/24/16 0744  WBC 5.1 7.2 5.2 7.3  HGB 8.5* 10.1* 8.1* 8.3*    HCT 26.0* 30.6* 25.0* 24.9*  PLT 152 173 164 179    COAGS:  Recent Labs  07/24/16 0744  INR 1.07  APTT 32    BMP:  Recent Labs  03/23/16 1543 03/24/16 0007 04/13/16 0724 06/20/16 1410  NA 140 142 139 139  K 4.1 3.4* 4.2 3.7  CL 106 108 103 104  CO2 24 27 24 27   GLUCOSE 111* 130* 157* 113*  BUN 21* 23* 17 11  CALCIUM 7.7* 7.6* 8.7* 8.4*  CREATININE 1.80* 1.61* 0.92 0.76  GFRNONAA 36* 41* >60 >60  GFRAA 42* 48* >60 >60    LIVER FUNCTION TESTS:  Recent Labs  03/03/16 1032 03/11/16 1201 03/23/16 1543 06/20/16 1410  BILITOT 0.7 1.1 1.2 0.5  AST 15 19 34 19  ALT 10* 12* 16* 10*  ALKPHOS 241* 245* 182* 391*  PROT 7.0 7.0 6.7 7.0  ALBUMIN 3.4* 3.5 3.2* 3.3*    TUMOR MARKERS: No results for input(s): AFPTM, CEA, CA199, CHROMGRNA in the last 8760 hours.  Assessment and Plan:  71 yo male presents for CT guided targeted bone biopsy.     Electronically Signed: Corrie Mckusick 07/24/2016, 8:34 AM

## 2016-07-24 NOTE — Progress Notes (Signed)
FSBS 116

## 2016-07-25 ENCOUNTER — Ambulatory Visit (INDEPENDENT_AMBULATORY_CARE_PROVIDER_SITE_OTHER): Payer: Medicare Other | Admitting: Urology

## 2016-07-25 ENCOUNTER — Ambulatory Visit: Payer: Medicare Other | Admitting: Urology

## 2016-07-25 VITALS — BP 112/56 | HR 69 | Ht 70.0 in | Wt 228.0 lb

## 2016-07-25 DIAGNOSIS — R350 Frequency of micturition: Secondary | ICD-10-CM | POA: Diagnosis not present

## 2016-07-25 DIAGNOSIS — R3129 Other microscopic hematuria: Secondary | ICD-10-CM

## 2016-07-25 DIAGNOSIS — C7951 Secondary malignant neoplasm of bone: Secondary | ICD-10-CM | POA: Diagnosis not present

## 2016-07-25 DIAGNOSIS — R972 Elevated prostate specific antigen [PSA]: Secondary | ICD-10-CM

## 2016-07-25 LAB — URINALYSIS, COMPLETE
BILIRUBIN UA: NEGATIVE
Glucose, UA: NEGATIVE
Ketones, UA: NEGATIVE
LEUKOCYTES UA: NEGATIVE
Nitrite, UA: NEGATIVE
PH UA: 6 (ref 5.0–7.5)
Specific Gravity, UA: 1.02 (ref 1.005–1.030)
Urobilinogen, Ur: 0.2 mg/dL (ref 0.2–1.0)

## 2016-07-25 LAB — MICROSCOPIC EXAMINATION: BACTERIA UA: NONE SEEN

## 2016-07-25 LAB — BLADDER SCAN AMB NON-IMAGING

## 2016-07-25 NOTE — Progress Notes (Signed)
07/25/2016 10:19 AM   Michael Mathews Jun 15, 1945 [id number]  Referring provider: [name], MD [address], [place] [address]  Chief Complaint  Patient presents with  . Elevated PSA    discuss prostate biopsy    HPI: 1 - Lower Urinary Tract Symptom / Urinary Retention  - acute urinary retention 02/2016 managed with catheter x 2 mos and start tamsulosin. Passed voiding trial 04/2016. Cr <1 after resolution. Added finasteride 05/2016.   2 - Elevated PSA- PSA 10.81 (pre finasteride) during episode urinary retention 02/2016. He had multiple Bone lesions noted on a September 2017 CT scan in the T and L-spine consistent with sclerotic metastases. His DRE 05/2016 showed a 45gm prostate and some firmness. Repeat Dec 2017 PSA 10.3 on finasteride. A January 2018 CT revealed multiple lesions in the spine, pelvis, femur, ribs and mandible confirmed on a follow-up bone scan which showed diffuse bony metastatic disease. He's seen Dr. [name] at the [facility]. Yesterday,  he underwent a biopsy of an L1 bone lesion with interventional radiology.  PMH sig for DM2, COPD/O2/Still smokes, OSA/CPAP.   Today, patient is seen for the above. He's been well since the biopsy. He has urgency. PVR today 34 ml. He follows up Friday with Dr. [name] to check results if available. He continues tamsulosin and finasteride.   PMH: Past Medical History:  Diagnosis Date  . Arthritis   . Asthma   . COPD (chronic obstructive pulmonary disease) ([place])   . Cough    chronic  . Diabetes mellitus without complication ([place])   . Edema   . Elevated PSA   . Hypertension   . Orthopnea   . Oxygen deficit    o2 prn  . Shortness of breath dyspnea   . Sleep apnea    cpap    Surgical History: Past Surgical History:  Procedure Laterality Date  . CARDIAC CATHETERIZATION    . COLONOSCOPY    . EYE SURGERY    . KNEE ARTHROSCOPY      Home Medications:  Allergies as of 07/25/2016   No Known Allergies     Medication List       Accurate as of 07/25/16 10:19 AM. Always use your most recent med list.          aspirin EC 325 MG tablet Take 325 mg by mouth every morning.   atorvastatin 40 MG tablet Commonly known as:  LIPITOR Take 1 tablet (40 mg total) by mouth daily.   cloNIDine 0.3 MG tablet Commonly known as:  CATAPRES Take 0.3 mg by mouth 2 (two) times daily.   desonide 0.05 % cream Commonly known as:  DESOWEN   DULoxetine 30 MG capsule Commonly known as:  CYMBALTA Take 30 mg by mouth daily.   finasteride 5 MG tablet Commonly known as:  PROSCAR Take 1 tablet (5 mg total) by mouth daily.   Fluticasone-Salmeterol 250-50 MCG/DOSE Aepb Commonly known as:  ADVAIR Inhale 1 puff into the lungs 2 (two) times daily.   GLIPIZIDE XL 10 MG 24 hr tablet Generic drug:  glipiZIDE Take 20 mg by mouth daily.   hydrALAZINE 100 MG tablet Commonly known as:  APRESOLINE   losartan 100 MG tablet Commonly known as:  COZAAR Take 100 mg by mouth daily.   metFORMIN 500 MG tablet Commonly known as:  GLUCOPHAGE Take 500 mg by mouth 2 (two) times daily with a meal.   metoprolol 50 MG tablet Commonly known as:  LOPRESSOR Take  50 mg by mouth 2 (two) times daily.   tamsulosin 0.4 MG Caps capsule Commonly known as:  FLOMAX Take 1 capsule (0.4 mg total) by mouth daily.   terazosin 10 MG capsule Commonly known as:  HYTRIN       Allergies: No Known Allergies  Family History: Family History  Problem Relation Age of Onset  . CVA Mother   . CAD Father     Social History:  reports that he has been smoking.  He has a 27.50 pack-year smoking history. He has never used smokeless tobacco. He reports that he does not drink alcohol or use drugs.  ROS: UROLOGY Frequent Urination?: Yes Hard to postpone urination?: Yes Burning/pain with urination?: Yes Get up at night to urinate?: Yes Leakage of urine?: No Urine stream starts and stops?: No Trouble starting stream?: No Do you have  to strain to urinate?: No Blood in urine?: No Urinary tract infection?: No Sexually transmitted disease?: No Injury to kidneys or bladder?: No Painful intercourse?: No Weak stream?: No Erection problems?: No Penile pain?: Yes  Gastrointestinal Nausea?: No Vomiting?: No Indigestion/heartburn?: No Diarrhea?: No Constipation?: No  Constitutional Fever: No Night sweats?: No Weight loss?: No Fatigue?: No  Skin Skin rash/lesions?: No Itching?: No  Eyes Blurred vision?: Yes Double vision?: No  Ears/Nose/Throat Sore throat?: No Sinus problems?: No  Hematologic/Lymphatic Swollen glands?: No Easy bruising?: No  Cardiovascular Leg swelling?: No Chest pain?: No  Respiratory Cough?: No Shortness of breath?: Yes  Endocrine Excessive thirst?: No  Musculoskeletal Back pain?: Yes Joint pain?: No  Neurological Headaches?: No Dizziness?: No  Psychologic Depression?: No Anxiety?: No  Physical Exam: BP (!) 112/56   Pulse 69   Ht 5\' 10"  (1.778 m)   Wt 103.4 kg (228 lb)   BMI 32.71 kg/m   Constitutional:  Alert and oriented, No acute distress. HEENT: [place] AT, moist mucus membranes.  Trachea midline, no masses. Cardiovascular: No clubbing, cyanosis, or edema. Respiratory: Normal respiratory effort, no increased work of breathing. GI: Abdomen is soft, nontender, nondistended, no abdominal masses GU: No CVA tenderness. Bandage over bx site dry.  Skin: No rashes, bruises or suspicious lesions. Neurologic: Grossly intact, no focal deficits, moving all 4 extremities. Psychiatric: Normal mood and affect.  Laboratory Data: Lab Results  Component Value Date   WBC 7.3 07/24/2016   HGB 8.3 (L) 07/24/2016   HCT 24.9 (L) 07/24/2016   MCV 92.1 07/24/2016   PLT 179 07/24/2016    Lab Results  Component Value Date   CREATININE 0.76 06/20/2016    Lab Results  Component Value Date   PSA 10.81 (H) 03/05/2016    No results found for: TESTOSTERONE  No results  found for: HGBA1C  Urinalysis    Component Value Date/Time   COLORURINE STRAW (A) 04/13/2016 0724   APPEARANCEUR CLEAR (A) 04/13/2016 0724   LABSPEC 1.010 04/13/2016 0724   PHURINE 7.0 04/13/2016 0724   GLUCOSEU NEGATIVE 04/13/2016 0724   HGBUR 1+ (A) 04/13/2016 0724   BILIRUBINUR NEGATIVE 04/13/2016 0724   KETONESUR NEGATIVE 04/13/2016 0724   PROTEINUR 30 (A) 04/13/2016 0724   NITRITE NEGATIVE 04/13/2016 0724   LEUKOCYTESUR NEGATIVE 04/13/2016 0724    Pertinent Imaging:  Assessment & Plan:   1. Urinary frequency - PVR normal. Monitor.   2. Elevated PSA, abnormal DRE, multiple bone mets - await bone bx results. If equivocal may need prostate biopsy. We can see back for prostate biopsy, otherwise will see in 6 weeks to monitor voiding and treatment.  Dr. [name] is planning ADT +/- Zytiga or docetaxel per his note. Pt may also benefit from [place]. I spoke with Dr. [name]. She'll send back to [place] it pt biopsy equivocal or negative for prostate biopsy.   3. MH - he has 11-30 rbc's on UA today. He has no gross hematuria. Will recheck urine. He may need cystoscopy. He is a smoker.   - Urinalysis, Complete   No Follow-up on file.  Festus Aloe, [facility] [address], [place], [place] [address] [phone]

## 2016-07-26 ENCOUNTER — Telehealth: Payer: Self-pay | Admitting: *Deleted

## 2016-07-26 ENCOUNTER — Other Ambulatory Visit: Payer: Self-pay | Admitting: Pathology

## 2016-07-26 LAB — SURGICAL PATHOLOGY

## 2016-07-26 NOTE — Telephone Encounter (Signed)
L1 bone biopsy is positive for metastatic prostate cancer

## 2016-07-28 ENCOUNTER — Inpatient Hospital Stay: Payer: Medicare Other

## 2016-07-28 ENCOUNTER — Inpatient Hospital Stay: Payer: Medicare Other | Attending: Oncology | Admitting: Oncology

## 2016-07-28 ENCOUNTER — Encounter: Payer: Self-pay | Admitting: Oncology

## 2016-07-28 VITALS — BP 146/55 | HR 61 | Temp 97.7°F | Wt 228.0 lb

## 2016-07-28 DIAGNOSIS — N179 Acute kidney failure, unspecified: Secondary | ICD-10-CM | POA: Insufficient documentation

## 2016-07-28 DIAGNOSIS — G4733 Obstructive sleep apnea (adult) (pediatric): Secondary | ICD-10-CM | POA: Insufficient documentation

## 2016-07-28 DIAGNOSIS — R972 Elevated prostate specific antigen [PSA]: Secondary | ICD-10-CM | POA: Insufficient documentation

## 2016-07-28 DIAGNOSIS — C61 Malignant neoplasm of prostate: Secondary | ICD-10-CM | POA: Insufficient documentation

## 2016-07-28 DIAGNOSIS — I10 Essential (primary) hypertension: Secondary | ICD-10-CM | POA: Insufficient documentation

## 2016-07-28 DIAGNOSIS — E119 Type 2 diabetes mellitus without complications: Secondary | ICD-10-CM | POA: Diagnosis not present

## 2016-07-28 DIAGNOSIS — C7951 Secondary malignant neoplasm of bone: Secondary | ICD-10-CM | POA: Diagnosis not present

## 2016-07-28 DIAGNOSIS — Z7189 Other specified counseling: Secondary | ICD-10-CM | POA: Insufficient documentation

## 2016-07-28 DIAGNOSIS — F17210 Nicotine dependence, cigarettes, uncomplicated: Secondary | ICD-10-CM | POA: Insufficient documentation

## 2016-07-28 DIAGNOSIS — J440 Chronic obstructive pulmonary disease with acute lower respiratory infection: Secondary | ICD-10-CM | POA: Insufficient documentation

## 2016-07-28 DIAGNOSIS — D649 Anemia, unspecified: Secondary | ICD-10-CM | POA: Diagnosis not present

## 2016-07-28 DIAGNOSIS — R911 Solitary pulmonary nodule: Secondary | ICD-10-CM | POA: Diagnosis not present

## 2016-07-28 DIAGNOSIS — Z79899 Other long term (current) drug therapy: Secondary | ICD-10-CM | POA: Diagnosis not present

## 2016-07-28 LAB — CBC
HCT: 24.8 % — ABNORMAL LOW (ref 40.0–52.0)
Hemoglobin: 8 g/dL — ABNORMAL LOW (ref 13.0–18.0)
MCH: 30 pg (ref 26.0–34.0)
MCHC: 32.1 g/dL (ref 32.0–36.0)
MCV: 93.3 fL (ref 80.0–100.0)
PLATELETS: 164 10*3/uL (ref 150–440)
RBC: 2.65 MIL/uL — ABNORMAL LOW (ref 4.40–5.90)
RDW: 16.2 % — ABNORMAL HIGH (ref 11.5–14.5)
WBC: 6.2 10*3/uL (ref 3.8–10.6)

## 2016-07-28 LAB — PSA: PSA: 10.84 ng/mL — AB (ref 0.00–4.00)

## 2016-07-28 MED ORDER — BICALUTAMIDE 50 MG PO TABS: 50.0000 mg | ORAL_TABLET | Freq: Every day | ORAL | 0 refills | Status: DC

## 2016-07-28 MED ORDER — FERROUS SULFATE 325 (65 FE) MG PO TBEC: 325.0000 mg | DELAYED_RELEASE_TABLET | Freq: Two times a day (BID) | ORAL | 3 refills | Status: DC

## 2016-07-28 MED ORDER — VITAMIN B-12 1000 MCG PO TABS: 1000.0000 ug | ORAL_TABLET | Freq: Every day | ORAL | 3 refills | Status: DC

## 2016-07-28 NOTE — Progress Notes (Signed)
Patient denies an y ne pains or aches at this time.  Vitals stable and documented.  Patient denies usage of supplemental O2.  Patient brought to exam room 4

## 2016-07-28 NOTE — Progress Notes (Signed)
Hematology/Oncology Consult note [facility]  Telephone:([phone] Fax:([phone]  Patient Care Team: [name], MD as PCP - General (Family Medicine)   Name of the patient: Michael Mathews  [id number]  09/01/1945   Date of visit: 07/28/16  Diagnosis- metastatic castrate sensitive prostate cancer   Chief complaint/ Reason for visit- discuss biopsy results  Heme/Onc history: 1. patient is 70 year old male with a past medical history significant for hypertension diabetes and COPD among other medical problems. He was admitted to the hospital in October 2017 with some symptoms of dizziness and abdominal pain. CT angiogram abdomen incidentally showed sclerotic metastatic disease throughout the visualized spine.   2. This was followed by an MRI of the lumbar spine which showed multifocal T1 and T2 hypointense lesions with areas of increased signal on STIR sequence are seen in multiple vertebral bodies. Largest lesions are in T11 T12 L1 and in the imaged sacrum. This is consistent with metastatic disease. Primary lesion is not identified. MRI brain showed no acute intracranial abnormality.   3. Patient was noted to have an elevated PSA and was referred to urology as an outpatient. He was recently seen by Dr. Tresa Moore on 05/29/2016. PSA was repeated at that time which came back elevated at 10.3. 3 months over the value was 10.81.  4. Bone scan on 07/07/16 showed : Widespread radiotracer uptake over the axial and appendicular skeleton as described compatible with metastatic disease and correlating with sclerotic lesions on Ct. CT chest on 07/07/16 showed:  Scattered sclerotic osseous metastatic lesions throughout spine, pelvis, proximal femora, ribs, and manubrium.  Prostatic enlargement with thickened bladder wall question related to chronic bladder outlet obstruction.  Spiculated 12 x 11 x 7 mm LEFT upper lobe nodule question primary pulmonary neoplasm. Stable  nonspecific small LEFT adrenal nodule. Single minimally enlarged AP window lymph node. BILATERAL inguinal hernias containing fat. 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 Recommend annual imaging followup by CTA or MRA.   5. Anemia work up from 06/20/16 was as follows: CBC showed white count of 5.2, H&H of 8.1/25 and a platelet count of 164. CMP showed elevated alkaline phosphatase of 391. Multiple myeloma panel showed normal quantitative immunoglobulins and no monoclonal M protein. Vitamin B12 level was low normal at 225. LDH was mildly elevated at 234. Ferritin was low normal at 27. Reticulocyte count was 2.4% inappropriately low for the degree of anemia. Haptoglobin was elevated at 273.  6. Patients case was discussed at tumor board and plan was to watch the lung nodule with scans as it was ina difficult area to biopsy. CT guided bone biopsy was obtained which showed metastatic prostate cancer  Interval history- overall he is doing well. Reports some fatigue and sob on exertion which is at his baseline  ECOG PS- 1 Pain scale- 0 Opioid associated constipation- no  Review of systems- Review of Systems  Constitutional: Positive for malaise/fatigue. Negative for chills, fever and weight loss.  HENT: Negative for congestion, ear discharge and nosebleeds.   Eyes: Negative for blurred vision.  Respiratory: Positive for shortness of breath. Negative for cough, hemoptysis, sputum production and wheezing.   Cardiovascular: Negative for chest pain, palpitations, orthopnea and claudication.  Gastrointestinal: Negative for abdominal pain, blood in stool, constipation, diarrhea, heartburn, melena, nausea and vomiting.  Genitourinary: Negative for dysuria, flank pain, frequency, hematuria and urgency.  Musculoskeletal: Positive for back pain. Negative for joint pain and  myalgias.  Skin: Negative for  rash.  Neurological: Negative for dizziness, tingling, focal weakness, seizures, weakness and headaches.  Endo/Heme/Allergies: Does not bruise/bleed easily.  Psychiatric/Behavioral: Negative for depression and suicidal ideas. The patient does not have insomnia.      Current treatment- yet to start  No Known Allergies   Past Medical History:  Diagnosis Date  . Arthritis   . Asthma   . COPD (chronic obstructive pulmonary disease) ([place])   . Cough    chronic  . Diabetes mellitus without complication ([place])   . Edema   . Elevated PSA   . Hypertension   . Orthopnea   . Oxygen deficit    o2 prn  . Shortness of breath dyspnea   . Sleep apnea    cpap     Past Surgical History:  Procedure Laterality Date  . CARDIAC CATHETERIZATION    . COLONOSCOPY    . EYE SURGERY    . KNEE ARTHROSCOPY      Social History   Social History  . Marital status: Unknown    Spouse name: N/A  . Number of children: N/A  . Years of education: N/A   Occupational History  . Not on file.   Social History Main Topics  . Smoking status: Current Every Day Smoker    Packs/day: 0.50    Years: 55.00  . Smokeless tobacco: Never Used  . Alcohol use No  . Drug use: No  . Sexual activity: Not on file   Other Topics Concern  . Not on file   Social History Narrative  . No narrative on file    Family History  Problem Relation Age of Onset  . CVA Mother   . CAD Father      Current Outpatient Prescriptions:  .  aspirin EC 325 MG tablet, Take 325 mg by mouth every morning., Disp: , Rfl:  .  atorvastatin (LIPITOR) 40 MG tablet, Take 1 tablet (40 mg total) by mouth daily., Disp: 30 tablet, Rfl: 0 .  cloNIDine (CATAPRES) 0.3 MG tablet, Take 0.3 mg by mouth 2 (two) times daily., Disp: , Rfl:  .  desonide (DESOWEN) 0.05 % cream, , Disp: , Rfl:  .  DULoxetine (CYMBALTA) 30 MG capsule, Take 30 mg by mouth daily., Disp: , Rfl:  .  finasteride (PROSCAR) 5 MG tablet, Take 1 tablet (5 mg total) by mouth  daily., Disp: 90 tablet, Rfl: 3 .  Fluticasone-Salmeterol (ADVAIR) 250-50 MCG/DOSE AEPB, Inhale 1 puff into the lungs 2 (two) times daily., Disp: , Rfl:  .  GLIPIZIDE XL 10 MG 24 hr tablet, Take 20 mg by mouth daily., Disp: , Rfl:  .  hydrALAZINE (APRESOLINE) 100 MG tablet, , Disp: , Rfl:  .  losartan (COZAAR) 100 MG tablet, Take 100 mg by mouth daily., Disp: , Rfl:  .  metFORMIN (GLUCOPHAGE) 500 MG tablet, Take 500 mg by mouth 2 (two) times daily with a meal., Disp: , Rfl:  .  metoprolol (LOPRESSOR) 50 MG tablet, Take 50 mg by mouth 2 (two) times daily., Disp: , Rfl:  .  tamsulosin (FLOMAX) 0.4 MG CAPS capsule, Take 1 capsule (0.4 mg total) by mouth daily., Disp: 90 capsule, Rfl: 3 .  terazosin (HYTRIN) 10 MG capsule, , Disp: , Rfl:   Physical exam:  Vitals:   07/28/16 1036  BP: (!) 146/55  Pulse: 61  Temp: 97.7 F (36.5 C)  TempSrc: Tympanic  Weight: 228 lb (103.4 kg)   Physical Exam  Constitutional: He  is oriented to person, place, and time.  Elderly, frail, no acute distress  HENT:  Head: Normocephalic and atraumatic.  Eyes: EOM are normal. Pupils are equal, round, and reactive to light.  Neck: Normal range of motion.  Cardiovascular: Normal rate, regular rhythm and normal heart sounds.   Pulmonary/Chest: Effort normal and breath sounds normal.  Abdominal: Soft. Bowel sounds are normal.  Neurological: He is alert and oriented to person, place, and time.  Skin: Skin is warm and dry.     CMP Latest Ref Rng & Units 06/20/2016  Glucose 65 - 99 mg/dL 113(H)  BUN 6 - 20 mg/dL 11  Creatinine 0.61 - 1.24 mg/dL 0.76  Sodium 135 - 145 mmol/L 139  Potassium 3.5 - 5.1 mmol/L 3.7  Chloride 101 - 111 mmol/L 104  CO2 22 - 32 mmol/L 27  Calcium 8.9 - 10.3 mg/dL 8.4(L)  Total Protein 6.5 - 8.1 g/dL 7.0  Total Bilirubin 0.3 - 1.2 mg/dL 0.5  Alkaline Phos 38 - 126 U/L 391(H)  AST 15 - 41 U/L 19  ALT 17 - 63 U/L 10(L)   CBC Latest Ref Rng & Units 07/24/2016  WBC 3.8 - 10.6 K/uL 7.3    Hemoglobin 13.0 - 18.0 g/dL 8.3(L)  Hematocrit 40.0 - 52.0 % 24.9(L)  Platelets 150 - 440 K/uL 179    No images are attached to the encounter.  Ct Chest W Contrast  Result Date: 07/07/2016 CLINICAL DATA:  Smoker, abnormal bone lesions on prior CT scan, elevated PSA, diabetes mellitus, hypertension, question metastatic disease EXAM: CT CHEST, ABDOMEN, AND PELVIS WITH CONTRAST TECHNIQUE: Multidetector CT imaging of the chest, abdomen and pelvis was performed following the standard protocol during bolus administration of intravenous contrast. Sagittal and coronal MPR images reconstructed from axial data set. CONTRAST:  131m ISOVUE-300 IOPAMIDOL (ISOVUE-300) INJECTION 61% IV. Dilute oral contrast. COMPARISON:  None. CT abdomen and pelvis 03/04/2016, CT chest 08/16/2008 FINDINGS: CT CHEST FINDINGS Cardiovascular: Atherosclerotic calcifications aorta, proximal great vessels, and coronary arteries. Small pericardial effusion. Aneurysmal dilatation of ascending thoracic aorta 4.3 cm transverse image 29 unchanged. Mediastinum/Nodes: Base of cervical region normal appearance. Scattered normal sized mediastinal and hilar lymph nodes. Single enlarged AP window node 12 mm short axis image 22. Calcified nodes at RIGHT hilum. No definite esophageal abnormalities. Lungs/Pleura: Multiple calcified granulomata RIGHT lung. Atelectasis in LEFT lower lobe with peribronchial thickening and question mucous plugging. Slightly spiculated mass identified in the LEFT upper lobe adjacent to the major fissure, 12 x 11 x 7 mm, concerning for a primary pulmonary neoplasm. Musculoskeletal: Numerous sclerotic osseous metastatic lesions are seen throughout the spine, ribs and manubrium. CT ABDOMEN PELVIS FINDINGS Hepatobiliary: Gallbladder and liver normal appearance Pancreas: Normal appearance Spleen: Tiny calcified granulomata within spleen. Otherwise normal appearance. Adrenals/Urinary Tract: Nonspecific 13 x 8 mm LEFT adrenal nodule,  unchanged. Calcifications in both kidneys many renovascular, question tiny nonobstructing calculi as well. Adrenal glands, kidneys, and ureters normal appearance. Mild thickening of the bladder wall question related to chronic outlet obstruction, with prostate gland mildly enlarged at 4.4 x 3.7 cm. Stomach/Bowel: Normal appendix. Stomach and bowel loops normal appearance. Vascular/Lymphatic: Atherosclerotic calcifications aorta and iliac arteries. Prominent aorta at aortic hiatus, 3.9 x 3.9 cm. No adenopathy. Reproductive: N/A Other: BILATERAL inguinal hernias containing fat. No free air free fluid. Musculoskeletal: Scattered sclerotic osseous metastatic lesions in spine and pelvis as well as proximal femora bilaterally. IMPRESSION: Scattered sclerotic osseous metastatic lesions throughout spine, pelvis, proximal femora, ribs, and manubrium. Prostatic enlargement with thickened  bladder wall question related to chronic bladder outlet obstruction. Spiculated 12 x 11 x 7 mm LEFT upper lobe nodule question primary pulmonary neoplasm. Stable nonspecific small LEFT adrenal nodule. Single minimally enlarged AP window lymph node. BILATERAL inguinal hernias containing fat. 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 Recommend annual imaging followup by CTA or MRA. This recommendation follows 2010 ACCF/AHA/AATS/ACR/ASA/SCA/SCAI/SIR/STS/SVM Guidelines for the Diagnosis and Management of Patients with Thoracic Aortic Disease. Circulation. 2010; 121: B341-P379 Electronically Signed   By: Lavonia Dana M.D.   On: 07/07/2016 17:16   Nm Bone Scan Whole Body  Result Date: 07/07/2016 CLINICAL DATA:  Sclerotic bone lesions on CT October 2017. Low back pain and right scapula/upper back pain. No known history of cancer. No injury. EXAM: NUCLEAR MEDICINE WHOLE BODY BONE SCAN TECHNIQUE: Whole body anterior and posterior images were  obtained approximately 3 hours after intravenous injection of radiopharmaceutical. RADIOPHARMACEUTICALS:  21.97 mCi Technetium-83mMDP IV COMPARISON:  CT 07/07/2016 and 03/04/2016 FINDINGS: Examination demonstrates multiple foci of increased radiotracer activity throughout the thoracolumbar spine, anterior and posterior ribs, bilateral humerus and bilateral femurs and sternal manubrial junction. Multiple foci of uptake over the sacrum/ pelvic bones most prominent over the midline sacrum and right ischium. Subtle focus of uptake over the upper cervical spine and left skullbase. These findings correlate with sclerotic lesions on CT and are likely due to metastatic disease. IMPRESSION: Widespread radiotracer uptake over the axial and appendicular skeleton as described compatible with metastatic disease and correlating with sclerotic lesions on CT. Electronically Signed   By: DMarin OlpM.D.   On: 07/07/2016 16:31   Ct Abdomen Pelvis W Contrast  Result Date: 07/07/2016 CLINICAL DATA:  Smoker, abnormal bone lesions on prior CT scan, elevated PSA, diabetes mellitus, hypertension, question metastatic disease EXAM: CT CHEST, ABDOMEN, AND PELVIS WITH CONTRAST TECHNIQUE: Multidetector CT imaging of the chest, abdomen and pelvis was performed following the standard protocol during bolus administration of intravenous contrast. Sagittal and coronal MPR images reconstructed from axial data set. CONTRAST:  1025mISOVUE-300 IOPAMIDOL (ISOVUE-300) INJECTION 61% IV. Dilute oral contrast. COMPARISON:  None. CT abdomen and pelvis 03/04/2016, CT chest 08/16/2008 FINDINGS: CT CHEST FINDINGS Cardiovascular: Atherosclerotic calcifications aorta, proximal great vessels, and coronary arteries. Small pericardial effusion. Aneurysmal dilatation of ascending thoracic aorta 4.3 cm transverse image 29 unchanged. Mediastinum/Nodes: Base of cervical region normal appearance. Scattered normal sized mediastinal and hilar lymph nodes. Single  enlarged AP window node 12 mm short axis image 22. Calcified nodes at RIGHT hilum. No definite esophageal abnormalities. Lungs/Pleura: Multiple calcified granulomata RIGHT lung. Atelectasis in LEFT lower lobe with peribronchial thickening and question mucous plugging. Slightly spiculated mass identified in the LEFT upper lobe adjacent to the major fissure, 12 x 11 x 7 mm, concerning for a primary pulmonary neoplasm. Musculoskeletal: Numerous sclerotic osseous metastatic lesions are seen throughout the spine, ribs and manubrium. CT ABDOMEN PELVIS FINDINGS Hepatobiliary: Gallbladder and liver normal appearance Pancreas: Normal appearance Spleen: Tiny calcified granulomata within spleen. Otherwise normal appearance. Adrenals/Urinary Tract: Nonspecific 13 x 8 mm LEFT adrenal nodule, unchanged. Calcifications in both kidneys many renovascular, question tiny nonobstructing calculi as well. Adrenal glands, kidneys, and ureters normal appearance. Mild thickening of the bladder wall question related to chronic outlet obstruction, with prostate gland mildly enlarged at 4.4 x 3.7 cm. Stomach/Bowel: Normal appendix. Stomach and bowel loops normal appearance. Vascular/Lymphatic: Atherosclerotic calcifications aorta and iliac arteries. Prominent aorta at aortic hiatus, 3.9 x 3.9 cm. No  adenopathy. Reproductive: N/A Other: BILATERAL inguinal hernias containing fat. No free air free fluid. Musculoskeletal: Scattered sclerotic osseous metastatic lesions in spine and pelvis as well as proximal femora bilaterally. IMPRESSION: Scattered sclerotic osseous metastatic lesions throughout spine, pelvis, proximal femora, ribs, and manubrium. Prostatic enlargement with thickened bladder wall question related to chronic bladder outlet obstruction. Spiculated 12 x 11 x 7 mm LEFT upper lobe nodule question primary pulmonary neoplasm. Stable nonspecific small LEFT adrenal nodule. Single minimally enlarged AP window lymph node. BILATERAL inguinal  hernias containing fat. 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 Recommend annual imaging followup by CTA or MRA. This recommendation follows 2010 ACCF/AHA/AATS/ACR/ASA/SCA/SCAI/SIR/STS/SVM Guidelines for the Diagnosis and Management of Patients with Thoracic Aortic Disease. Circulation. 2010; 121: G269-S854 Electronically Signed   By: Lavonia Dana M.D.   On: 07/07/2016 17:16   Ct Biopsy  Result Date: 07/24/2016 INDICATION: 71 year old male with a history of multiple blastic lesions throughout the appendicular and axial skeleton, concerning for metastatic prostate carcinoma. He has been referred for biopsy. EXAM: CT BIOPSY MEDICATIONS: None. ANESTHESIA/SEDATION: Moderate (conscious) sedation was employed during this procedure. A total of Versed 2.0 mg and Fentanyl 100 mcg was administered intravenously. Moderate Sedation Time: 25 minutes. The patient's level of consciousness and vital signs were monitored continuously by radiology nursing throughout the procedure under my direct supervision. FLUOROSCOPY TIME:  CT COMPLICATIONS: None PROCEDURE: Informed written consent was obtained from the patient after a thorough discussion of the procedural risks, benefits and alternatives. All questions were addressed. Maximal Sterile Barrier Technique was utilized including caps, mask, sterile gowns, sterile gloves, sterile drape, hand hygiene and skin antiseptic. A timeout was performed prior to the initiation of the procedure. Patient is position prone position on the CT gantry table. Scout CT of the L1 level was performed with images stored sent to PACs. The patient was then prepped and draped in usual sterile fashion. The skin and subcutaneous tissues were generously infiltrated 1% lidocaine for local anesthesia. Using CT guidance, 11 gauge Murphy needle was advanced into the left L1 pedicle targeting the sclerotic  lesion. Once the needle was confirmed in location, the stylet was removed, and the coaxial 14 gauge Francine tip augering needle was advanced into the pedicle. CT image confirmed location. Sample was placed into formalin and a second 14 gauge biopsy was performed. Finally 11 gauge core biopsy was performed with the [facility] Iii needle. This core biopsy was placed into formalin. Sterile bandage was placed. Patient tolerated the procedure well and remained hemodynamically stable throughout. No complications were encountered and no significant blood loss. IMPRESSION: Status post CT-guided core biopsy of sclerotic bone lesion of the L1 vertebral body. Tissue specimen sent to pathology for complete histopathologic analysis. Signed, Dulcy Fanny. Earleen Newport, DO [facility] Electronically Signed   By: Corrie Mckusick D.O.   On: 07/24/2016 10:12     Assessment and plan- Patient is a 71 y.o. male with newly diagnosed castrate sensitive metastatic prostate cancer to the bone  I again discussed the results of CT scans as well as bone scan and the results of recent bone biopsy which is consistent with metastatic prostate cancer. Since patient did not have prostate biopsy we do not have any information on Gleason score. He does have widespread bony metastases but no evidence of visceral metastases or lymph node metastases and his PSA is mildly elevated at 10. He is also significantly anemic likely from chronic  disease as workup for his anemia has essentially been unremarkable other than borderline low iron and B12 levels. For his metastatic prostate cancer I would like him to start taking Casodex 50 mg once daily for 2 weeks following which I will initiate Lupron every 3 months and stop the Casodex at that time. Given that he does not have visceral or lymph node metastasis use I will hold off on initiating docetaxel or zytiga at this time. Also patient lives alone and does not have  a good social support or means of communication.  I will consider those options at progression. Today I will obtain a repeat CBC CMP and a PSA. He will come back to the clinic in 2 weeks to get his first Lupron shot. I discussed the risks and benefits of Lupron and Casodex including all but not limited to fatigue, gynecomastia and worsening anemia. Patient understands and agrees to proceed as planned. The goal of therapy at this time will be palliative and not curative to improve his quality of life and longevity. Patient is an understanding of the plan   With regards to his anemia- we will prescribe iron tablets 325 mg twice a day along with oral B12 tablets. I will repeat his CBC along with iron studies and B12 in 6 weeks' time and check a repeat PSA as well at that time  With regards to his left upper lobe lung nodule- I will be repeating his scans in 3 months and decide about further management of his lung nodule at that time based on my discussion at the tumor board   Total face to face encounter time for this patient visit was 30 min. >50% of the time was  spent in counseling and coordination of care.     Visit Diagnosis 1. Prostate cancer ([place])   2. Anemia, unspecified type   3. Prostate cancer metastatic to bone ([place])   4. Goals of care, counseling/discussion      Dr. Randa Evens, MD, MPH [facility] Pager- [phone] 07/28/2016 10:23 AM

## 2016-07-30 ENCOUNTER — Telehealth: Payer: Self-pay | Admitting: *Deleted

## 2016-07-30 NOTE — Telephone Encounter (Signed)
Pt came to clinic to check up on if bx results back yet. He has appt for 2/20 but his phone is not working for [place] to call so pt. Comes in today to check on the results.  He was added on as a pt to see dr [name] to go over results and make a plan.  Dr. [name] wanted pt to start casodex for 2 weeks and then start lupron shot for his prostate cancer. I went over the plan and pt agreeable and wants casodex sent to his pharmacy. Also dr. [name] wants him to start on iron pills and b12 tablets and pt wants that sent to pharmacy.  I called his pharmacy to see if they have the OTC meds and they do dispense them there as long as they have a rx.  Then pt states he needs to wait til 3/3 because he gets paid then. He is on medicare and gets next check then to pay for meds. We have agreed that he will start his 2 weeks of casodex on 3/5 and take it for 2 weeks and then he will come to cancer center for his first injection 3/19. After that he has other f/u appts set up.  Pt is agreeable to this and I have sent message that when pt comes to pick up drugs if it is costly to call me to see if I can help. Patient also knows this also.

## 2016-08-01 ENCOUNTER — Inpatient Hospital Stay: Payer: Medicare Other | Admitting: Oncology

## 2016-08-07 ENCOUNTER — Ambulatory Visit: Payer: Medicare Other

## 2016-08-10 ENCOUNTER — Telehealth: Payer: Self-pay | Admitting: *Deleted

## 2016-08-10 NOTE — Telephone Encounter (Signed)
Pt came in and wondered if I pur in rx for his meds he needs to start and I told him that I did. He will start the casodex on 3/5 andhe wanted to wait because he had ran out of money fo rfeb. And he gets new check 3/2.  He will go tom. And pick up rx at his pharmacy.

## 2016-08-28 ENCOUNTER — Inpatient Hospital Stay: Payer: Medicare Other | Attending: Oncology

## 2016-08-28 DIAGNOSIS — Z79818 Long term (current) use of other agents affecting estrogen receptors and estrogen levels: Secondary | ICD-10-CM | POA: Insufficient documentation

## 2016-08-28 DIAGNOSIS — J449 Chronic obstructive pulmonary disease, unspecified: Secondary | ICD-10-CM | POA: Diagnosis not present

## 2016-08-28 DIAGNOSIS — C61 Malignant neoplasm of prostate: Secondary | ICD-10-CM

## 2016-08-28 DIAGNOSIS — G4730 Sleep apnea, unspecified: Secondary | ICD-10-CM | POA: Diagnosis not present

## 2016-08-28 DIAGNOSIS — D649 Anemia, unspecified: Secondary | ICD-10-CM | POA: Insufficient documentation

## 2016-08-28 DIAGNOSIS — I712 Thoracic aortic aneurysm, without rupture: Secondary | ICD-10-CM | POA: Insufficient documentation

## 2016-08-28 DIAGNOSIS — Z7982 Long term (current) use of aspirin: Secondary | ICD-10-CM | POA: Insufficient documentation

## 2016-08-28 DIAGNOSIS — Z79899 Other long term (current) drug therapy: Secondary | ICD-10-CM | POA: Insufficient documentation

## 2016-08-28 DIAGNOSIS — I10 Essential (primary) hypertension: Secondary | ICD-10-CM | POA: Diagnosis not present

## 2016-08-28 DIAGNOSIS — C7951 Secondary malignant neoplasm of bone: Secondary | ICD-10-CM | POA: Insufficient documentation

## 2016-08-28 DIAGNOSIS — Z7984 Long term (current) use of oral hypoglycemic drugs: Secondary | ICD-10-CM | POA: Insufficient documentation

## 2016-08-28 DIAGNOSIS — R911 Solitary pulmonary nodule: Secondary | ICD-10-CM | POA: Insufficient documentation

## 2016-08-28 DIAGNOSIS — I700 Atherosclerosis of aorta: Secondary | ICD-10-CM | POA: Insufficient documentation

## 2016-08-28 DIAGNOSIS — E119 Type 2 diabetes mellitus without complications: Secondary | ICD-10-CM | POA: Diagnosis not present

## 2016-08-28 DIAGNOSIS — K4020 Bilateral inguinal hernia, without obstruction or gangrene, not specified as recurrent: Secondary | ICD-10-CM | POA: Diagnosis not present

## 2016-08-28 DIAGNOSIS — F17210 Nicotine dependence, cigarettes, uncomplicated: Secondary | ICD-10-CM | POA: Diagnosis not present

## 2016-08-28 MED ORDER — LEUPROLIDE ACETATE (3 MONTH) 22.5 MG IM KIT
22.5000 mg | PACK | Freq: Once | INTRAMUSCULAR | Status: AC
  Administered 2016-08-28: 22.5 mg via INTRAMUSCULAR
  Filled 2016-08-28: qty 22.5

## 2016-09-04 ENCOUNTER — Ambulatory Visit (INDEPENDENT_AMBULATORY_CARE_PROVIDER_SITE_OTHER): Payer: Medicare Other | Admitting: Urology

## 2016-09-04 VITALS — BP 166/65 | HR 55 | Ht 70.0 in | Wt 220.2 lb

## 2016-09-04 DIAGNOSIS — N138 Other obstructive and reflux uropathy: Secondary | ICD-10-CM

## 2016-09-04 DIAGNOSIS — C61 Malignant neoplasm of prostate: Secondary | ICD-10-CM | POA: Diagnosis not present

## 2016-09-04 DIAGNOSIS — N401 Enlarged prostate with lower urinary tract symptoms: Secondary | ICD-10-CM

## 2016-09-04 DIAGNOSIS — C7951 Secondary malignant neoplasm of bone: Secondary | ICD-10-CM

## 2016-09-04 DIAGNOSIS — R3912 Poor urinary stream: Secondary | ICD-10-CM | POA: Diagnosis not present

## 2016-09-04 LAB — MICROSCOPIC EXAMINATION

## 2016-09-04 LAB — URINALYSIS, COMPLETE
BILIRUBIN UA: NEGATIVE
GLUCOSE, UA: NEGATIVE
LEUKOCYTES UA: NEGATIVE
NITRITE UA: NEGATIVE
SPEC GRAV UA: 1.02 (ref 1.005–1.030)
Urobilinogen, Ur: 1 mg/dL (ref 0.2–1.0)
pH, UA: 6.5 (ref 5.0–7.5)

## 2016-09-04 NOTE — Progress Notes (Signed)
09/04/2016 10:52 AM   Michael Mathews 09-30-1945 [id number]  Referring provider: [name], MD [name], [place] [address]  Chief Complaint  Patient presents with  . Follow-up    prostate cancer     HPI: 1 - Stage IV Prostate Cancer - PSA 10.81 (pre finasteride) during episode urinary retention 02/2016. He had multiple Bone lesions noted on a September 2017 CT scan in the T and L-spine consistent with sclerotic metastases. His DRE 05/2016 showed a 45gm prostate and some firmness. Repeat Dec 2017 PSA 10.3 on finasteride. A January 2018 CT revealed multiple lesions in the spine, pelvis, femur, ribs and mandible confirmed on a follow-up bone scan which showed diffuse bony metastatic disease. He sees Dr. [name] at the [facility]. Biopsy of L1 c/w prostate adenocarcinoma. He has no visceral or LN mets, so he start Casodex and Lupron with Dr. [name].   2 - Lower Urinary Tract Symptom / Urinary Retention / BPH - acute urinary retention 02/2016 managed with catheter x 2 mos and start tamsulosin. Passed voiding trial 04/2016. Cr <1 after resolution. Added finasteride 05/2016. PVR was 34 mL.   3. Microhematuria - pt with MH at Feb 2018 visit with 3-10 rbc's per hpf. He is a smoker.   PMH sig for DM2, COPD/O2/Still smokes, OSA/CPAP.   Today, patient is seen for the above. He's been well since the biopsy. He's voiding about the same. No gross hematuria.   He continues tamsulosin but not sure he's still on finasteride. No gross hematuria.   He continues to have [place].    PMH: Past Medical History:  Diagnosis Date  . Arthritis   . Asthma   . COPD (chronic obstructive pulmonary disease) ([place])   . Cough    chronic  . Diabetes mellitus without complication ([place])   . Edema   . Elevated PSA   . Hypertension   . Orthopnea   . Oxygen deficit    o2 prn  . Shortness of breath dyspnea   . Sleep apnea    cpap    Surgical History: Past Surgical History:  Procedure  Laterality Date  . CARDIAC CATHETERIZATION    . COLONOSCOPY    . EYE SURGERY    . KNEE ARTHROSCOPY      Home Medications:  Allergies as of 09/04/2016   No Known Allergies     Medication List       Accurate as of 09/04/16 10:52 AM. Always use your most recent med list.          aspirin EC 325 MG tablet Take 325 mg by mouth every morning.   atorvastatin 40 MG tablet Commonly known as:  LIPITOR Take 1 tablet (40 mg total) by mouth daily.   bicalutamide 50 MG tablet Commonly known as:  CASODEX Take 1 tablet (50 mg total) by mouth daily.   cloNIDine 0.3 MG tablet Commonly known as:  CATAPRES Take 0.3 mg by mouth 2 (two) times daily.   desonide 0.05 % cream Commonly known as:  DESOWEN   DULoxetine 30 MG capsule Commonly known as:  CYMBALTA Take 30 mg by mouth daily.   ferrous sulfate 325 (65 FE) MG EC tablet Take 1 tablet (325 mg total) by mouth 2 (two) times daily with a meal.   finasteride 5 MG tablet Commonly known as:  PROSCAR Take 1 tablet (5 mg total) by mouth daily.   Fluticasone-Salmeterol 250-50 MCG/DOSE Aepb Commonly known as:  ADVAIR Inhale 1  puff into the lungs 2 (two) times daily.   GLIPIZIDE XL 10 MG 24 hr tablet Generic drug:  glipiZIDE Take 20 mg by mouth daily.   hydrALAZINE 100 MG tablet Commonly known as:  APRESOLINE   losartan 100 MG tablet Commonly known as:  COZAAR Take 100 mg by mouth daily.   metFORMIN 500 MG tablet Commonly known as:  GLUCOPHAGE Take 500 mg by mouth 2 (two) times daily with a meal.   metoprolol 50 MG tablet Commonly known as:  LOPRESSOR Take 50 mg by mouth 2 (two) times daily.   tamsulosin 0.4 MG Caps capsule Commonly known as:  FLOMAX Take 1 capsule (0.4 mg total) by mouth daily.   terazosin 10 MG capsule Commonly known as:  HYTRIN   vitamin B-12 1000 MCG tablet Commonly known as:  CYANOCOBALAMIN Take 1 tablet (1,000 mcg total) by mouth daily.       Allergies: No Known Allergies  Family  History: Family History  Problem Relation Age of Onset  . CVA Mother   . CAD Father     Social History:  reports that he has been smoking.  He has a 27.50 pack-year smoking history. He has never used smokeless tobacco. He reports that he does not drink alcohol or use drugs.  ROS: UROLOGY Frequent Urination?: No Hard to postpone urination?: No Burning/pain with urination?: Yes Get up at night to urinate?: Yes Leakage of urine?: No Urine stream starts and stops?: No Trouble starting stream?: No Do you have to strain to urinate?: No Blood in urine?: No Urinary tract infection?: No Sexually transmitted disease?: No Injury to kidneys or bladder?: No Painful intercourse?: No Weak stream?: No Erection problems?: No Penile pain?: No  Gastrointestinal Nausea?: No Vomiting?: No Indigestion/heartburn?: No Diarrhea?: No Constipation?: No  Constitutional Fever: No Night sweats?: No Weight loss?: No Fatigue?: No  Skin Skin rash/lesions?: No Itching?: No  Eyes Blurred vision?: Yes Double vision?: No  Ears/Nose/Throat Sore throat?: No Sinus problems?: Yes  Hematologic/Lymphatic Swollen glands?: No Easy bruising?: No  Cardiovascular Leg swelling?: Yes Chest pain?: No  Respiratory Cough?: No Shortness of breath?: Yes  Endocrine Excessive thirst?: No  Musculoskeletal Back pain?: Yes Joint pain?: No  Neurological Headaches?: No Dizziness?: No  Psychologic Depression?: No Anxiety?: No  Physical Exam: BP (!) 166/65   Pulse (!) 55   Ht 5\' 10"  (1.778 m)   Wt 99.9 kg (220 lb 3.2 oz)   BMI 31.60 kg/m   Constitutional:  Alert and oriented, No acute distress. HEENT: [employer] AT, moist mucus membranes.  Trachea midline, no masses. Cardiovascular: No clubbing, cyanosis, or edema. Respiratory: Normal respiratory effort, no increased work of breathing. Skin: No rashes, bruises or suspicious lesions. Neurologic: Grossly intact, no focal deficits, moving all 4  extremities. Psychiatric: Normal mood and affect.  Laboratory Data: Lab Results  Component Value Date   WBC 6.2 07/28/2016   HGB 8.0 (L) 07/28/2016   HCT 24.8 (L) 07/28/2016   MCV 93.3 07/28/2016   PLT 164 07/28/2016    Lab Results  Component Value Date   CREATININE 0.76 06/20/2016    Lab Results  Component Value Date   PSA 10.84 (H) 07/28/2016   PSA 10.81 (H) 03/05/2016    No results found for: TESTOSTERONE  No results found for: HGBA1C  Urinalysis    Component Value Date/Time   COLORURINE STRAW (A) 04/13/2016 [place] (A) 07/25/2016 1024   LABSPEC 1.010 04/13/2016 0724   PHURINE 7.0 04/13/2016 0724  GLUCOSEU Negative 07/25/2016 1024   HGBUR 1+ (A) 04/13/2016 0724   BILIRUBINUR Negative 07/25/2016 1024   KETONESUR NEGATIVE 04/13/2016 0724   PROTEINUR 1+ (A) 07/25/2016 1024   PROTEINUR 30 (A) 04/13/2016 0724   NITRITE Negative 07/25/2016 1024   NITRITE NEGATIVE 04/13/2016 0724   LEUKOCYTESUR Negative 07/25/2016 1024   Chart review - I reviewed Dr. [name] office notes and CT biopsy images.   Assessment & Plan:   1. Prostate Cancer Stage IV (bone mets) - casodex and Lupron per Oncology Dr. [name].   2. BPH/LUTS - stop finasteride. Continue tamsulosin.  - Urinalysis, Complete  3. MH - f/u for cystoscopy.    No Follow-up on file.  Festus Aloe, [facility] [address] [place], [place] [address] [phone]

## 2016-09-07 LAB — CULTURE, URINE COMPREHENSIVE

## 2016-09-08 ENCOUNTER — Inpatient Hospital Stay (HOSPITAL_BASED_OUTPATIENT_CLINIC_OR_DEPARTMENT_OTHER): Payer: Medicare Other | Admitting: Oncology

## 2016-09-08 ENCOUNTER — Inpatient Hospital Stay: Payer: Medicare Other

## 2016-09-08 VITALS — BP 112/69 | HR 59 | Temp 95.6°F | Resp 22 | Wt 219.8 lb

## 2016-09-08 DIAGNOSIS — C61 Malignant neoplasm of prostate: Secondary | ICD-10-CM

## 2016-09-08 DIAGNOSIS — D649 Anemia, unspecified: Secondary | ICD-10-CM

## 2016-09-08 DIAGNOSIS — R911 Solitary pulmonary nodule: Secondary | ICD-10-CM

## 2016-09-08 DIAGNOSIS — Z79899 Other long term (current) drug therapy: Secondary | ICD-10-CM

## 2016-09-08 DIAGNOSIS — C7951 Secondary malignant neoplasm of bone: Secondary | ICD-10-CM | POA: Diagnosis not present

## 2016-09-08 DIAGNOSIS — Z79818 Long term (current) use of other agents affecting estrogen receptors and estrogen levels: Secondary | ICD-10-CM | POA: Diagnosis not present

## 2016-09-08 DIAGNOSIS — F17210 Nicotine dependence, cigarettes, uncomplicated: Secondary | ICD-10-CM | POA: Diagnosis not present

## 2016-09-08 DIAGNOSIS — Z5181 Encounter for therapeutic drug level monitoring: Secondary | ICD-10-CM

## 2016-09-08 LAB — IRON AND TIBC
IRON: 53 ug/dL (ref 45–182)
Saturation Ratios: 15 % — ABNORMAL LOW (ref 17.9–39.5)
TIBC: 343 ug/dL (ref 250–450)
UIBC: 290 ug/dL

## 2016-09-08 LAB — CBC
HCT: 32.6 % — ABNORMAL LOW (ref 40.0–52.0)
HEMOGLOBIN: 10.7 g/dL — AB (ref 13.0–18.0)
MCH: 31.6 pg (ref 26.0–34.0)
MCHC: 32.7 g/dL (ref 32.0–36.0)
MCV: 96.4 fL (ref 80.0–100.0)
Platelets: 189 10*3/uL (ref 150–440)
RBC: 3.38 MIL/uL — AB (ref 4.40–5.90)
RDW: 18.3 % — ABNORMAL HIGH (ref 11.5–14.5)
WBC: 4.7 10*3/uL (ref 3.8–10.6)

## 2016-09-08 LAB — FERRITIN: FERRITIN: 39 ng/mL (ref 24–336)

## 2016-09-08 NOTE — Progress Notes (Signed)
No new complaints, continues to have low back pain, chronic in nature.  States that he weighted 200 lbs at the urologist on Monday and today it is 219.8, reports wearing the same shoes at that visit as he has on today as well.

## 2016-09-08 NOTE — Progress Notes (Signed)
Hematology/Oncology Consult note [facility]  Telephone:([phone] Fax:([phone]  Patient Care Team: [name], MD as PCP - General (Family Medicine)   Name of the patient: Michael Mathews  [id number]  03-08-1946   Date of visit: 09/08/16  Diagnosis- metastatic castrate sensitive prostate cancer   Chief complaint/ Reason for visit- discuss biopsy results  Heme/Onc history: 1. patient is 71 year old male with a past medical history significant for hypertension diabetes and COPD among other medical problems. He was admitted to the hospital in October 2017 with some symptoms of dizziness and abdominal pain. CT angiogram abdomen incidentally showed sclerotic metastatic disease throughout the visualized spine.   2. This was followed by an MRI of the lumbar spine which showed multifocal T1 and T2 hypointense lesions with areas of increased signal on STIR sequence are seen in multiple vertebral bodies. Largest lesions are in T11 T12 L1 and in the imaged sacrum. This is consistent with metastatic disease. Primary lesion is not identified. MRI brain showed no acute intracranial abnormality.   3. Patient was noted to have an elevated PSA and was referred to urology as an outpatient. He was recently seen by Dr. Tresa Moore on 05/29/2016. PSA was repeated at that time which came back elevated at 10.3. 3 months over the value was 10.81.  4. Bone scan on 07/07/16 showed : Widespread radiotracer uptake over the axial and appendicular skeleton as described compatible with metastatic disease and correlating with sclerotic lesions on Ct. CT chest on 07/07/16 showed:  Scattered sclerotic osseous metastatic lesions throughout spine, pelvis, proximal femora, ribs, and manubrium.  Prostatic enlargement with thickened bladder wall question related to chronic bladder outlet obstruction.  Spiculated 12 x 11 x 7 mm LEFT upper lobe nodule question primary pulmonary neoplasm. Stable  nonspecific small LEFT adrenal nodule. Single minimally enlarged AP window lymph node. BILATERAL inguinal hernias containing fat. 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 Recommend annual imaging followup by CTA or MRA.   5. Anemia work up from 06/20/16 was as follows: CBC showed white count of 5.2, H&H of 8.1/25 and a platelet count of 164. CMP showed elevated alkaline phosphatase of 391. Multiple myeloma panel showed normal quantitative immunoglobulins and no monoclonal M protein. Vitamin B12 level was low normal at 225. LDH was mildly elevated at 234. Ferritin was low normal at 27. Reticulocyte count was 2.4% inappropriately low for the degree of anemia. Haptoglobin was elevated at 273.  6. Patients case was discussed at tumor board and plan was to watch the lung nodule with scans as it was in a difficult area to biopsy. CT guided bone biopsy was obtained which showed metastatic prostate cancer  Interval history- overall he is doing well. Reports chronic fatigue, sob on exertion, and chronic back pain radiating down both legs, which is at his baseline.  He reports food "just doesn't taste good anymore" and has lost 9 pounds since 07/28/16.  He also reports getting a bit dizzy with position changes and orthostatic vitals showed BP of 145/79 with HR 57 sitting, BP 112/69 and HR 59 standing. He reports occasional numbness in both hands, not affecting daily activities.  He reported difficulty staying asleep, utilizing CPAP nightly.  He denies nausea, vomiting, constipation, diarrhea, hematuria, hematochezia, melena, headache, hot flashes and night sweats.  ECOG PS- 1 Pain scale- 0 Opioid associated constipation- no  Review of systems- Review of Systems  Constitutional: Positive for  malaise/fatigue. Negative for chills, fever and weight loss.  HENT: Negative for congestion, ear discharge and  nosebleeds.   Eyes: Negative for blurred vision.  Respiratory: Positive for shortness of breath. Negative for cough, hemoptysis, sputum production and wheezing.   Cardiovascular: Negative for chest pain, palpitations, orthopnea and claudication.  Gastrointestinal: Negative for abdominal pain, blood in stool, constipation, diarrhea, heartburn, melena, nausea and vomiting.  Genitourinary: Negative for dysuria, flank pain, frequency, hematuria and urgency.  Musculoskeletal: Positive for back pain. Negative for joint pain and myalgias.  Skin: Negative for rash.  Neurological: Positive for dizziness and sensory change. Negative for tingling, focal weakness, seizures, weakness and headaches.  Endo/Heme/Allergies: Does not bruise/bleed easily.  Psychiatric/Behavioral: Negative for depression and suicidal ideas. The patient does not have insomnia.      Current treatment- yet to start  No Known Allergies   Past Medical History:  Diagnosis Date  . Arthritis   . Asthma   . COPD (chronic obstructive pulmonary disease) ([place])   . Cough    chronic  . Diabetes mellitus without complication ([place])   . Edema   . Elevated PSA   . Hypertension   . Orthopnea   . Oxygen deficit    o2 prn  . Shortness of breath dyspnea   . Sleep apnea    cpap     Past Surgical History:  Procedure Laterality Date  . CARDIAC CATHETERIZATION    . COLONOSCOPY    . EYE SURGERY    . KNEE ARTHROSCOPY      Social History   Social History  . Marital status: Unknown    Spouse name: N/A  . Number of children: N/A  . Years of education: N/A   Occupational History  . Not on file.   Social History Main Topics  . Smoking status: Current Every Day Smoker    Packs/day: 0.50    Years: 55.00  . Smokeless tobacco: Never Used  . Alcohol use No  . Drug use: No  . Sexual activity: Not on file   Other Topics Concern  . Not on file   Social History Narrative  . No narrative on file    Family History  Problem  Relation Age of Onset  . CVA Mother   . CAD Father      Current Outpatient Prescriptions:  .  aspirin EC 325 MG tablet, Take 325 mg by mouth every morning., Disp: , Rfl:  .  atorvastatin (LIPITOR) 40 MG tablet, Take 1 tablet (40 mg total) by mouth daily., Disp: 30 tablet, Rfl: 0 .  cloNIDine (CATAPRES) 0.3 MG tablet, Take 0.3 mg by mouth 2 (two) times daily., Disp: , Rfl:  .  desonide (DESOWEN) 0.05 % cream, , Disp: , Rfl:  .  Fluticasone-Salmeterol (ADVAIR) 250-50 MCG/DOSE AEPB, Inhale 1 puff into the lungs 2 (two) times daily., Disp: , Rfl:  .  GLIPIZIDE XL 10 MG 24 hr tablet, Take 20 mg by mouth daily., Disp: , Rfl:  .  hydrALAZINE (APRESOLINE) 100 MG tablet, , Disp: , Rfl:  .  losartan (COZAAR) 100 MG tablet, Take 100 mg by mouth daily., Disp: , Rfl:  .  metFORMIN (GLUCOPHAGE) 500 MG tablet, Take 500 mg by mouth 2 (two) times daily with a meal., Disp: , Rfl:  .  metoprolol (LOPRESSOR) 50 MG tablet, Take 50 mg by mouth 2 (two) times daily., Disp: , Rfl:  .  tamsulosin (FLOMAX) 0.4 MG CAPS capsule, Take 1 capsule (0.4 mg total) by mouth daily.,  Disp: 90 capsule, Rfl: 3 .  terazosin (HYTRIN) 10 MG capsule, , Disp: , Rfl:  .  vitamin B-12 (CYANOCOBALAMIN) 1000 MCG tablet, Take 1 tablet (1,000 mcg total) by mouth daily., Disp: 30 tablet, Rfl: 3 .  bicalutamide (CASODEX) 50 MG tablet, Take 1 tablet (50 mg total) by mouth daily. (Patient not taking: Reported on 09/04/2016), Disp: 14 tablet, Rfl: 0 .  DULoxetine (CYMBALTA) 30 MG capsule, Take 30 mg by mouth daily., Disp: , Rfl:  .  ferrous sulfate 325 (65 FE) MG EC tablet, Take 1 tablet (325 mg total) by mouth 2 (two) times daily with a meal. (Patient not taking: Reported on 09/04/2016), Disp: 60 tablet, Rfl: 3  Physical exam:  Vitals:   09/08/16 1419  BP: (!) 148/73  Pulse: 65  Resp: (!) 22  Temp: (!) 95.6 F (35.3 C)  TempSrc: Tympanic  Weight: 219 lb 12.8 oz (99.7 kg)   Physical Exam  Constitutional: He is oriented to person, place,  and time.  Elderly, frail, no acute distress  HENT:  Head: Normocephalic and atraumatic.  Eyes: EOM are normal. Pupils are equal, round, and reactive to light.  Neck: Normal range of motion.  Cardiovascular: Normal rate, regular rhythm and normal heart sounds.   Pulmonary/Chest: Effort normal and breath sounds normal.  Abdominal: Soft. Bowel sounds are normal.  Neurological: He is alert and oriented to person, place, and time.  Skin: Skin is warm and dry.     CMP Latest Ref Rng & Units 06/20/2016  Glucose 65 - 99 mg/dL 113(H)  BUN 6 - 20 mg/dL 11  Creatinine 0.61 - 1.24 mg/dL 0.76  Sodium 135 - 145 mmol/L 139  Potassium 3.5 - 5.1 mmol/L 3.7  Chloride 101 - 111 mmol/L 104  CO2 22 - 32 mmol/L 27  Calcium 8.9 - 10.3 mg/dL 8.4(L)  Total Protein 6.5 - 8.1 g/dL 7.0  Total Bilirubin 0.3 - 1.2 mg/dL 0.5  Alkaline Phos 38 - 126 U/L 391(H)  AST 15 - 41 U/L 19  ALT 17 - 63 U/L 10(L)   CBC Latest Ref Rng & Units 09/08/2016  WBC 3.8 - 10.6 K/uL 4.7  Hemoglobin 13.0 - 18.0 g/dL 10.7(L)  Hematocrit 40.0 - 52.0 % 32.6(L)  Platelets 150 - 440 K/uL 189    No images are attached to the encounter.  No results found.   Assessment and plan- Patient is a 71 y.o. 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 He took casodex for a week then stopped. Next dose of lupron due in June 2018. PSA trending down.   With regards to his anemia- continue iron tablets 325 mg twice a day along with oral B12 tablets daily as prescribed. H/H improved  With regards to his left upper lobe lung nodule- will order repeat CT chest with contrast prior to next visit in 3 months based on discussion at tumor board.  Will discuss scan results at next visit.   Bone density scan ordered prior to next visit due to Lupron injections.  Review results at next visit.  [place]: Discussed sleep hygiene: dark bedroom,  discontinuing screen (computer, phone, tablet, T.V.) usage 1-2 hours prior to bedtime, not eating or drinking 2 hours prior to bedtime, and limiting daytime naps to 20-45 minutes.  Recommended patient follow up with sleep clinic.   Visit Diagnosis 1. Prostate cancer ([place])   2. Encounter for monitoring adjuvant hormonal therapy  3. Prostate cancer metastatic to bone [facility])   4. Lung nodule     Lucendia Herrlich, NP  09/08/16 4:39 PM

## 2016-09-09 ENCOUNTER — Encounter: Payer: Self-pay | Admitting: Oncology

## 2016-09-09 LAB — VITAMIN B12: VITAMIN B 12: 1047 pg/mL — AB (ref 180–914)

## 2016-09-09 LAB — PSA: PSA: 4.73 ng/mL — AB (ref 0.00–4.00)

## 2016-09-13 ENCOUNTER — Other Ambulatory Visit: Payer: Self-pay

## 2016-09-14 ENCOUNTER — Encounter: Payer: Self-pay | Admitting: Emergency Medicine

## 2016-09-14 ENCOUNTER — Emergency Department
Admission: EM | Admit: 2016-09-14 | Discharge: 2016-09-14 | Disposition: A | Discharge status: Home / Self Care | Payer: Medicare Other | Attending: Emergency Medicine | Admitting: Emergency Medicine

## 2016-09-14 DIAGNOSIS — I10 Essential (primary) hypertension: Secondary | ICD-10-CM | POA: Diagnosis not present

## 2016-09-14 DIAGNOSIS — R339 Retention of urine, unspecified: Secondary | ICD-10-CM | POA: Diagnosis present

## 2016-09-14 DIAGNOSIS — F17200 Nicotine dependence, unspecified, uncomplicated: Secondary | ICD-10-CM | POA: Insufficient documentation

## 2016-09-14 DIAGNOSIS — J45909 Unspecified asthma, uncomplicated: Secondary | ICD-10-CM | POA: Diagnosis not present

## 2016-09-14 DIAGNOSIS — Z7984 Long term (current) use of oral hypoglycemic drugs: Secondary | ICD-10-CM | POA: Diagnosis not present

## 2016-09-14 DIAGNOSIS — Z79899 Other long term (current) drug therapy: Secondary | ICD-10-CM | POA: Diagnosis not present

## 2016-09-14 DIAGNOSIS — J449 Chronic obstructive pulmonary disease, unspecified: Secondary | ICD-10-CM | POA: Diagnosis not present

## 2016-09-14 DIAGNOSIS — E119 Type 2 diabetes mellitus without complications: Secondary | ICD-10-CM | POA: Insufficient documentation

## 2016-09-14 DIAGNOSIS — R338 Other retention of urine: Secondary | ICD-10-CM

## 2016-09-14 LAB — URINALYSIS, ROUTINE W REFLEX MICROSCOPIC
BACTERIA UA: NONE SEEN
Bilirubin Urine: NEGATIVE
Glucose, UA: NEGATIVE mg/dL
Ketones, ur: NEGATIVE mg/dL
Leukocytes, UA: NEGATIVE
NITRITE: NEGATIVE
Protein, ur: 100 mg/dL — AB
SPECIFIC GRAVITY, URINE: 1.01 (ref 1.005–1.030)
pH: 6 (ref 5.0–8.0)

## 2016-09-14 NOTE — ED Provider Notes (Signed)
[facility] Emergency Department Provider Note  ____________________________________________   First MD Initiated Contact with Patient 09/14/16 [phone]     (approximate)  I have reviewed the triage vital signs and the nursing notes.   HISTORY  Chief Complaint Urinary Retention    HPI Michael Mathews is a 71 y.o. male with an extensive past medical history including prior episodes of urinary retention as well as metastatic prostate cancer who presents for evaluation of acute urinary retention for about 8 hours.  He has a lot of pressure in his abdomen and he describes the pain and the symptoms as severe.  They have been gradual in onset over the last 8 hours.  He has had to have an indwelling Foley catheter in the past.  His urologist is Dr. Tresa Moore.   Past Medical History:  Diagnosis Date  . Arthritis   . Asthma   . COPD (chronic obstructive pulmonary disease) ([place])   . Cough    chronic  . Diabetes mellitus without complication ([place])   . Edema   . Elevated PSA   . Hypertension   . Orthopnea   . Oxygen deficit    o2 prn  . Shortness of breath dyspnea   . Sleep apnea    cpap    Patient Active Problem List   Diagnosis Date Noted  . Prostate cancer metastatic to bone ([place]) 07/28/2016  . Goals of care, counseling/discussion 07/28/2016  . Elevated PSA 05/01/2016  . Urinary retention 05/01/2016  . Pneumonia 03/23/2016  . ARF (acute renal failure) ([place]) 03/11/2016  . Dizziness 03/03/2016  . Mixed hyperlipidemia 09/01/2015  . OSA (obstructive sleep apnea) 09/01/2015  . Atherosclerotic peripheral vascular disease with intermittent claudication ([place]) 04/16/2015  . Lumbar radiculitis 08/13/2014  . Lumbar stenosis with neurogenic claudication 08/13/2014  . Cardiomyopathy ([place]) 01/23/2013  . DOE (dyspnea on exertion) 01/23/2013  . Hyperlipidemia 01/23/2013  . PVC (premature ventricular contraction) 01/23/2013  . Nonspecific elevation of levels of  transaminase and lactic acid dehydrogenase (ldh) 07/21/2011  . Type 2 diabetes mellitus without complications ([place]) 49/70/2637  . Sciatica 06/28/2010  . Chronic obstructive pulmonary disease ([place]) 12/02/2008  . Essential (primary) hypertension 09/05/2007  . Personal history of transient ischemic attack (TIA), and cerebral infarction without residual deficits 09/05/2007    Past Surgical History:  Procedure Laterality Date  . CARDIAC CATHETERIZATION    . COLONOSCOPY    . EYE SURGERY    . KNEE ARTHROSCOPY      Prior to Admission medications   Medication Sig Start Date End Date Taking? Authorizing Provider  aspirin EC 325 MG tablet Take 325 mg by mouth every morning.    Historical Provider, MD  atorvastatin (LIPITOR) 40 MG tablet Take 1 tablet (40 mg total) by mouth daily. 03/24/16   Bettey Costa, MD  bicalutamide (CASODEX) 50 MG tablet Take 1 tablet (50 mg total) by mouth daily. Patient not taking: Reported on 09/04/2016 07/28/16   Sindy Guadeloupe, MD  cloNIDine (CATAPRES) 0.3 MG tablet Take 0.3 mg by mouth 2 (two) times daily.    Historical Provider, MD  desonide (DESOWEN) 0.05 % cream  07/05/16   Historical Provider, MD  DULoxetine (CYMBALTA) 30 MG capsule Take 30 mg by mouth daily. 01/04/16   Historical Provider, MD  ferrous sulfate 325 (65 FE) MG EC tablet Take 1 tablet (325 mg total) by mouth 2 (two) times daily with a meal. Patient not taking: Reported on 09/04/2016 07/28/16   Sindy Guadeloupe, MD  Fluticasone-Salmeterol (ADVAIR) 250-50 MCG/DOSE AEPB Inhale 1 puff into the lungs 2 (two) times daily.    Historical Provider, MD  GLIPIZIDE XL 10 MG 24 hr tablet Take 20 mg by mouth daily. 01/19/16   Historical Provider, MD  hydrALAZINE (APRESOLINE) 100 MG tablet  05/31/16   Historical Provider, MD  losartan (COZAAR) 100 MG tablet Take 100 mg by mouth daily.    Historical Provider, MD  metFORMIN (GLUCOPHAGE) 500 MG tablet Take 500 mg by mouth 2 (two) times daily with a meal.    Historical Provider, MD    metoprolol (LOPRESSOR) 50 MG tablet Take 50 mg by mouth 2 (two) times daily.    Historical Provider, MD  tamsulosin (FLOMAX) 0.4 MG CAPS capsule Take 1 capsule (0.4 mg total) by mouth daily. 05/29/16   [name], MD  terazosin (HYTRIN) 10 MG capsule  05/31/16   Historical Provider, MD  vitamin B-12 (CYANOCOBALAMIN) 1000 MCG tablet Take 1 tablet (1,000 mcg total) by mouth daily. 07/28/16   Sindy Guadeloupe, MD    Allergies Patient has no known allergies.  Family History  Problem Relation Age of Onset  . CVA Mother   . CAD Father     Social History Social History  Substance Use Topics  . Smoking status: Current Every Day Smoker    Packs/day: 0.50    Years: 55.00  . Smokeless tobacco: Never Used  . Alcohol use No    Review of Systems Constitutional: No fever/chills Eyes: No visual changes. ENT: No sore throat. Cardiovascular: Denies chest pain. Respiratory: Denies shortness of breath. Gastrointestinal: Suprapubic distention and pain pain.  No nausea, no vomiting.  No diarrhea.  No constipation. Genitourinary: Urinary retention for about 8 hours.  No dysuria/hematuria. Musculoskeletal: Negative for back pain. Skin: Negative for rash. Neurological: Negative for headaches, focal weakness or numbness.  10-point ROS otherwise negative.  ____________________________________________   PHYSICAL EXAM:  VITAL SIGNS: ED Triage Vitals  Enc Vitals Group     BP 09/14/16 0127 (!) 247/102     Pulse Rate 09/14/16 0127 91     Resp 09/14/16 0127 20     Temp --      Temp Source 09/14/16 0127 Oral     SpO2 09/14/16 0127 97 %     Weight 09/14/16 0129 219 lb (99.3 kg)     Height 09/14/16 0129 5\' 10"  (1.778 m)     Head Circumference --      Peak Flow --      Pain Score 09/14/16 0127 6     Pain Loc --      Pain Edu? --      Excl. in [place]? --     Constitutional: Alert and oriented. Well appearing and in no acute distress. Eyes: Conjunctivae are normal. PERRL. EOMI. Head:  Atraumatic. Nose: No congestion/rhinnorhea. Mouth/Throat: Mucous membranes are moist. Neck: No stridor.  No meningeal signs.   Cardiovascular: Normal rate, regular rhythm. Good peripheral circulation. Grossly normal heart sounds. Respiratory: Normal respiratory effort.  No retractions. Lungs CTAB. Gastrointestinal: Soft and nontender. No distention.  Genitourinary: Indwelling Foley catheter placed in the ED draining yellowish-clear urine. Musculoskeletal: No lower extremity tenderness nor edema. No gross deformities of extremities. Neurologic:  Normal speech and language. No gross focal neurologic deficits are appreciated.  Skin:  Skin is warm, dry and intact. No rash noted. Psychiatric: Mood and affect are normal. Speech and behavior are normal.  ____________________________________________   LABS (all labs ordered are listed, but only abnormal results  are displayed)  Labs Reviewed  URINALYSIS, ROUTINE W REFLEX MICROSCOPIC - Abnormal; Notable for the following:       Result Value   Color, Urine STRAW (*)    APPearance CLEAR (*)    Hgb urine dipstick SMALL (*)    Protein, ur 100 (*)    Squamous Epithelial / LPF 0-5 (*)    All other components within normal limits   ____________________________________________  EKG  None - EKG not ordered by ED physician ____________________________________________  RADIOLOGY   No results found.  ____________________________________________   PROCEDURES  Critical Care performed: No   Procedure(s) performed:   Procedures   ____________________________________________   INITIAL IMPRESSION / ASSESSMENT AND PLAN / ED COURSE  Pertinent labs & imaging results that were available during my care of the patient were reviewed by me and considered in my medical decision making (see chart for details).  Initial bladder scan showed more than a liter of urine.  He has drained about 600 mL at this time and the Foley continues to drain.   He feels much better and is in no acute distress.  His initial blood pressure was extremely elevated but I think this is likely due to his discomfort.  I am sending a urinalysis to look for any sign of infection which I will treat with Keflex as needed but the current plan is for him to keep an indwelling Foley with leg bag and follow-up with his urologist.  He understands and agrees with this plan.   Clinical Course as of Sep 15 338  Thu Sep 14, 2016  0339 No indication of UTI.  Will d/c with leg bag for outpatient follow up.    BP normalized after Foley placement.  I gave my usual and customary return precautions.    [CF]    Clinical Course User Index [CF] Hinda Kehr, MD    ____________________________________________  FINAL CLINICAL IMPRESSION(S) / ED DIAGNOSES  Final diagnoses:  Acute urinary retention     MEDICATIONS GIVEN DURING THIS VISIT:  Medications - No data to display   NEW OUTPATIENT MEDICATIONS STARTED DURING THIS VISIT:  New Prescriptions   No medications on file    Modified Medications   No medications on file    Discontinued Medications   No medications on file     Note:  This document was prepared using Dragon voice recognition software and may include unintentional dictation errors.    Hinda Kehr, MD 09/14/16 [phone]

## 2016-09-14 NOTE — ED Triage Notes (Signed)
Pt presents to ED with urinary retention. Pt reports similar symptoms a few months ago and states he needed a foley catheter. Last time voided was around 1700 last night. Attempted again just prior to triage with no success.

## 2016-09-14 NOTE — Discharge Instructions (Signed)
You were seen in the Emergency Department (ED) for urinary retention.  We placed a Foley catheter to help your bladder drain.  Please read through the included information and follow up with your doctor as recommended in these papers; your doctor will see you in clinic and help you determine when it is time to have the catheter removed.  If you stop producing urine in the bag or if you develop other symptoms that concern you, such as fever, chills, persistent vomiting, or severe abdominal pain, please return immediately to the Emergency Department.  ° °

## 2016-09-14 NOTE — ED Notes (Signed)
Leg bagged placed, education for pt provided.

## 2016-09-15 ENCOUNTER — Ambulatory Visit: Payer: Medicare Other

## 2016-10-04 ENCOUNTER — Other Ambulatory Visit: Payer: Self-pay | Admitting: Radiology

## 2016-10-04 ENCOUNTER — Encounter: Payer: Self-pay | Admitting: Urology

## 2016-10-04 ENCOUNTER — Ambulatory Visit (INDEPENDENT_AMBULATORY_CARE_PROVIDER_SITE_OTHER): Payer: Medicare Other | Admitting: Urology

## 2016-10-04 VITALS — BP 142/74 | HR 66 | Ht 70.0 in | Wt 222.4 lb

## 2016-10-04 DIAGNOSIS — R339 Retention of urine, unspecified: Secondary | ICD-10-CM

## 2016-10-04 DIAGNOSIS — C61 Malignant neoplasm of prostate: Secondary | ICD-10-CM

## 2016-10-04 DIAGNOSIS — D494 Neoplasm of unspecified behavior of bladder: Secondary | ICD-10-CM

## 2016-10-04 DIAGNOSIS — N138 Other obstructive and reflux uropathy: Secondary | ICD-10-CM

## 2016-10-04 DIAGNOSIS — N401 Enlarged prostate with lower urinary tract symptoms: Principal | ICD-10-CM

## 2016-10-04 MED ORDER — LIDOCAINE HCL 2 % EX GEL
1.0000 "application " | Freq: Once | CUTANEOUS | Status: AC
  Administered 2016-10-04: 1 via URETHRAL

## 2016-10-04 MED ORDER — CIPROFLOXACIN HCL 500 MG PO TABS
500.0000 mg | ORAL_TABLET | Freq: Once | ORAL | Status: AC
  Administered 2016-10-04: 500 mg via ORAL

## 2016-10-04 NOTE — Progress Notes (Signed)
   10/04/16  CC:  Chief Complaint  Patient presents with  . Cysto    urinary retention    HPI: 71 year old male with a history of advanced prostate cancer and obstructive oiding symptoms presents today for cystoscopy secondary to microscopic hematuria. 2 weeks prior the patient presented to the emergency room for urinary retention and a Foley catheter was placed. The catheter has been in situ since, and the patient has been tolerating it reasonably well.  This is the second episode of urinary retention for this patient.  Blood pressure (!) 142/74, pulse 66, height 5\' 10"  (1.778 m), weight 100.9 kg (222 lb 6.4 oz). NED. A&Ox3.   No respiratory distress   Abd soft, NT, ND Normal phallus with bilateral descended testicles  Cystoscopy Procedure Note  Patient identification was confirmed, informed consent was obtained, and patient was prepped using Betadine solution.  Lidocaine jelly was administered per urethral meatus.    Preoperative abx where received prior to procedure.     Pre-Procedure: - Inspection reveals a normal caliber ureteral meatus.  Procedure: The flexible cystoscope was introduced without difficulty The patient had a lesion on the anterior aspect of the apical prosthetic urethra consistent with locally advanced prostate cancer. He had obstructing lateral lobes and a long urethra He had bullous edema within his bladder consistent with catheter associated irritation. There was an area on the posterior bladder wall on the right hand side that appeared to resemble more a transitional cell carcinoma then bullous edema. There is also an area on the left lateral wall that was concerning. Together, these lesions measure approximately 2 cm. The bladder was slightly trabeculated, ureteral orifice was orthotopic.   Post-Procedure: - Patient tolerated the procedure well  Assessment/ Plan:  I recommended that the patient consider a channel TURP for his obstruction as well as a  TURBT for the concerning lesion seen in his bladder today.  The patient was given a voiding trial concurrently with cystoscopine was instilled in the patient's bladder and 75 voided. The patient was counseled on returning to the emergency department if he is unable to void upon discharge, but we will leave his Foley catheter out.  We will get him scheduled for follow-up surgery as soon as possible. The patient should be cleared by his primary care provider before proceeding to the operating room.

## 2016-10-04 NOTE — Progress Notes (Signed)
Catheter Removal  Patient is present today for a catheter removal.  9 ml of water was drained from the balloon. A 16 FR foley cath was removed from the bladder no complications were noted . Patient tolerated well. The pt had a cystoscopy today and 100 ml of sodium chloride was instilled in the bladder. He was able to void out 75 ml after the cystoscopy. The catheter was not replaced and pt was notified to come back to the office if develop any issues voiding.  Preformed by: K.Emiyah Spraggins, CMA

## 2016-10-12 ENCOUNTER — Encounter
Admission: RE | Admit: 2016-10-12 | Discharge: 2016-10-12 | Disposition: A | Discharge status: Home / Self Care | Payer: Medicare Other | Source: Ambulatory Visit | Attending: Urology | Admitting: Urology

## 2016-10-12 DIAGNOSIS — R339 Retention of urine, unspecified: Secondary | ICD-10-CM | POA: Diagnosis not present

## 2016-10-12 DIAGNOSIS — Z01812 Encounter for preprocedural laboratory examination: Secondary | ICD-10-CM | POA: Insufficient documentation

## 2016-10-12 DIAGNOSIS — C61 Malignant neoplasm of prostate: Secondary | ICD-10-CM | POA: Diagnosis not present

## 2016-10-12 HISTORY — DX: Headache, unspecified: R51.9

## 2016-10-12 HISTORY — DX: Headache: R51

## 2016-10-12 HISTORY — DX: Atherosclerotic heart disease of native coronary artery without angina pectoris: I25.10

## 2016-10-12 LAB — URINALYSIS, ROUTINE W REFLEX MICROSCOPIC
BILIRUBIN URINE: NEGATIVE
Bacteria, UA: NONE SEEN
Glucose, UA: NEGATIVE mg/dL
Hgb urine dipstick: NEGATIVE
Ketones, ur: NEGATIVE mg/dL
Leukocytes, UA: NEGATIVE
Nitrite: NEGATIVE
PH: 5 (ref 5.0–8.0)
Protein, ur: 100 mg/dL — AB
SPECIFIC GRAVITY, URINE: 1.023 (ref 1.005–1.030)
Squamous Epithelial / LPF: NONE SEEN

## 2016-10-12 LAB — CBC
HCT: 34 % — ABNORMAL LOW (ref 40.0–52.0)
Hemoglobin: 11.3 g/dL — ABNORMAL LOW (ref 13.0–18.0)
MCH: 31.8 pg (ref 26.0–34.0)
MCHC: 33.3 g/dL (ref 32.0–36.0)
MCV: 95.5 fL (ref 80.0–100.0)
PLATELETS: 178 10*3/uL (ref 150–440)
RBC: 3.56 MIL/uL — AB (ref 4.40–5.90)
RDW: 15.8 % — ABNORMAL HIGH (ref 11.5–14.5)
WBC: 5.6 10*3/uL (ref 3.8–10.6)

## 2016-10-12 LAB — BASIC METABOLIC PANEL
Anion gap: 11 (ref 5–15)
BUN: 18 mg/dL (ref 6–20)
CALCIUM: 8.9 mg/dL (ref 8.9–10.3)
CO2: 25 mmol/L (ref 22–32)
CREATININE: 0.92 mg/dL (ref 0.61–1.24)
Chloride: 102 mmol/L (ref 101–111)
GFR calc non Af Amer: >60 mL/min (ref >60)
Glucose, Bld: 110 mg/dL — ABNORMAL HIGH (ref 65–99)
Potassium: 4 mmol/L (ref 3.5–5.1)
SODIUM: 138 mmol/L (ref 135–145)

## 2016-10-12 NOTE — Pre-Procedure Instructions (Signed)
Cardiac clearance and EKG in front of chart.  Pt is under the impression he will be staying overnight after surgery.  Urged pt to find someone to transport pt home and sty with him overnight or his surgery may be canceled.

## 2016-10-12 NOTE — Patient Instructions (Addendum)
  Your procedure is scheduled on:Wednesday Oct 18, 2017 , 2018. Report to Same Day Surgery at 10:00 am.  Remember: Instructions that are not followed completely may result in serious medical risk, up to and including death, or upon the discretion of your surgeon and anesthesiologist your surgery may need to be rescheduled.    _x___ 1. Do not eat food or drink liquids after midnight. No gum chewing or hard candies.     ____ 2. No Alcohol for 24 hours before or after surgery.   ____ 3. Bring all medications with you on the day of surgery if instructed.    __x__ 4. Notify your doctor if there is any change in your medical condition     (cold, fever, infections).    __x___ 5. No smoking 24 hours prior to surgery.     Do not wear jewelry, make-up, hairpins, clips or nail polish.  Do not wear lotions, powders, or perfumes.   Do not shave 48 hours prior to surgery. Men may shave face and neck.  Do not bring valuables to the hospital.    [facility] is not responsible for any belongings or valuables.               Contacts, dentures or bridgework may not be worn into surgery.  Leave your suitcase in the car. After surgery it may be brought to your room.  For patients admitted to the hospital, discharge time is determined by your treatment team.   Patients discharged the day of surgery will not be allowed to drive home.    Please read over the following fact sheets that you were given:   [facility] Preparing for Surgery  _x___ Take these medicines the morning of surgery with A SIP OF WATER:    1. atorvastatin (LIPITOR)   2. cloNIDine (CATAPRES)   3. DULoxetine (CYMBALTA)   4. hydrALAZINE (APRESOLINE)  5. losartan (COZAAR)   6. metoprolol (LOPRESSOR)  7. terazosin (HYTRIN)  ___ Fleet Enema (as directed)   ____ Use CHG Soap as directed on instruction sheet  ____ Use inhalers on the day of surgery and bring to hospital day of surgery  __x__ Stop metformin 2 days prior to  surgery    ____ Take 1/2 of usual insulin dose the night before surgery and none on the morning of          surgery.   __x__ Stop aspirin as directed by Dr. Nehemiah Massed.  __x__ Stop Anti-inflammatories such as Advil, Aleve, Ibuprofen, Motrin, Naproxen, Naprosyn, Goodies powders or aspirin products. OK to take Tylenol.   __x__ Stop supplements:vitamin B-12 (CYANOCOBALAMIN)  until after surgery.    __x__ Bring C-Pap to the hospital.

## 2016-10-13 ENCOUNTER — Telehealth: Payer: Self-pay | Admitting: Radiology

## 2016-10-13 LAB — URINE CULTURE: CULTURE: NO GROWTH

## 2016-10-13 NOTE — Pre-Procedure Instructions (Signed)
CBC sent to Dr. Erlene Quan and Anesthesia for review.   UA also sent to Dr. Erlene Quan for review.

## 2016-10-13 NOTE — Telephone Encounter (Signed)
-----   Message from Hollice Espy, MD sent at 10/13/2016  3:39 PM EDT ----- Marylou Flesher.  Best its been in 6 months.  Hollice Espy, MD  ----- Message ----- From: Ranell Patrick, RN Sent: 10/13/2016   2:44 PM To: Hollice Espy, MD  Please note Hgb 11.3. Ok to proceed? We got a fax from pre-admit.

## 2016-10-17 MED ORDER — CIPROFLOXACIN IN D5W 400 MG/200ML IV SOLN
400.0000 mg | INTRAVENOUS | Status: AC
  Administered 2016-10-18: 400 mg via INTRAVENOUS

## 2016-10-18 ENCOUNTER — Observation Stay
Admission: RE | Admit: 2016-10-18 | Discharge: 2016-10-19 | Disposition: A | Discharge status: Home / Self Care | Payer: Medicare Other | Source: Ambulatory Visit | Attending: Urology | Admitting: Urology

## 2016-10-18 ENCOUNTER — Ambulatory Visit: Payer: Medicare Other | Admitting: Anesthesiology

## 2016-10-18 ENCOUNTER — Encounter: Payer: Self-pay | Admitting: *Deleted

## 2016-10-18 ENCOUNTER — Encounter
Admission: RE | Disposition: A | Discharge status: Home / Self Care | Payer: Self-pay | Source: Ambulatory Visit | Attending: Urology

## 2016-10-18 DIAGNOSIS — I10 Essential (primary) hypertension: Secondary | ICD-10-CM | POA: Insufficient documentation

## 2016-10-18 DIAGNOSIS — N3091 Cystitis, unspecified with hematuria: Secondary | ICD-10-CM | POA: Diagnosis not present

## 2016-10-18 DIAGNOSIS — Z7984 Long term (current) use of oral hypoglycemic drugs: Secondary | ICD-10-CM | POA: Insufficient documentation

## 2016-10-18 DIAGNOSIS — J449 Chronic obstructive pulmonary disease, unspecified: Secondary | ICD-10-CM | POA: Diagnosis not present

## 2016-10-18 DIAGNOSIS — R339 Retention of urine, unspecified: Secondary | ICD-10-CM | POA: Insufficient documentation

## 2016-10-18 DIAGNOSIS — C61 Malignant neoplasm of prostate: Secondary | ICD-10-CM | POA: Diagnosis not present

## 2016-10-18 DIAGNOSIS — D494 Neoplasm of unspecified behavior of bladder: Secondary | ICD-10-CM

## 2016-10-18 DIAGNOSIS — F17200 Nicotine dependence, unspecified, uncomplicated: Secondary | ICD-10-CM | POA: Insufficient documentation

## 2016-10-18 DIAGNOSIS — N359 Urethral stricture, unspecified: Secondary | ICD-10-CM | POA: Insufficient documentation

## 2016-10-18 DIAGNOSIS — I2510 Atherosclerotic heart disease of native coronary artery without angina pectoris: Secondary | ICD-10-CM | POA: Insufficient documentation

## 2016-10-18 DIAGNOSIS — G4730 Sleep apnea, unspecified: Secondary | ICD-10-CM | POA: Insufficient documentation

## 2016-10-18 DIAGNOSIS — E119 Type 2 diabetes mellitus without complications: Secondary | ICD-10-CM | POA: Diagnosis not present

## 2016-10-18 DIAGNOSIS — N138 Other obstructive and reflux uropathy: Secondary | ICD-10-CM

## 2016-10-18 DIAGNOSIS — N401 Enlarged prostate with lower urinary tract symptoms: Secondary | ICD-10-CM

## 2016-10-18 HISTORY — PX: CYSTOSCOPY W/ RETROGRADES: SHX1426

## 2016-10-18 HISTORY — PX: TRANSURETHRAL RESECTION OF BLADDER TUMOR: SHX2575

## 2016-10-18 HISTORY — PX: TRANSURETHRAL RESECTION OF PROSTATE: SHX73

## 2016-10-18 LAB — GLUCOSE, CAPILLARY
GLUCOSE-CAPILLARY: 96 mg/dL (ref 65–99)
Glucose-Capillary: 99 mg/dL (ref 65–99)

## 2016-10-18 SURGERY — TURP (TRANSURETHRAL RESECTION OF PROSTATE)
Anesthesia: General | Site: Ureter | Wound class: Clean Contaminated

## 2016-10-18 MED ORDER — BICALUTAMIDE 50 MG PO TABS
50.0000 mg | ORAL_TABLET | Freq: Every day | ORAL | Status: DC
  Administered 2016-10-19: 50 mg via ORAL
  Filled 2016-10-18 (×3): qty 1

## 2016-10-18 MED ORDER — ONDANSETRON HCL 4 MG/2ML IJ SOLN
INTRAMUSCULAR | Status: AC
  Filled 2016-10-18: qty 2

## 2016-10-18 MED ORDER — DEXAMETHASONE SODIUM PHOSPHATE 10 MG/ML IJ SOLN
INTRAMUSCULAR | Status: AC
  Filled 2016-10-18: qty 1

## 2016-10-18 MED ORDER — FENTANYL CITRATE (PF) 100 MCG/2ML IJ SOLN: 25.0000 ug | INTRAMUSCULAR | Status: DC | PRN

## 2016-10-18 MED ORDER — FERROUS SULFATE 325 (65 FE) MG PO TABS
325.0000 mg | ORAL_TABLET | Freq: Two times a day (BID) | ORAL | Status: DC
  Administered 2016-10-18 – 2016-10-19 (×2): 325 mg via ORAL
  Filled 2016-10-18 (×2): qty 1

## 2016-10-18 MED ORDER — METOPROLOL TARTRATE 50 MG PO TABS
50.0000 mg | ORAL_TABLET | Freq: Two times a day (BID) | ORAL | Status: DC
  Administered 2016-10-18 – 2016-10-19 (×2): 50 mg via ORAL
  Filled 2016-10-18 (×2): qty 1

## 2016-10-18 MED ORDER — DULOXETINE HCL 30 MG PO CPEP
30.0000 mg | ORAL_CAPSULE | Freq: Every day | ORAL | Status: DC
  Administered 2016-10-19: 30 mg via ORAL
  Filled 2016-10-18: qty 1

## 2016-10-18 MED ORDER — SUGAMMADEX SODIUM 200 MG/2ML IV SOLN
INTRAVENOUS | Status: DC | PRN
  Administered 2016-10-18: 200 mg via INTRAVENOUS

## 2016-10-18 MED ORDER — PHENYLEPHRINE HCL 10 MG/ML IJ SOLN
INTRAMUSCULAR | Status: DC | PRN
  Administered 2016-10-18: 50 ug via INTRAVENOUS
  Administered 2016-10-18 (×2): 100 ug via INTRAVENOUS

## 2016-10-18 MED ORDER — LIDOCAINE HCL 2 % IJ SOLN
INTRAMUSCULAR | Status: AC
  Filled 2016-10-18: qty 10

## 2016-10-18 MED ORDER — ROCURONIUM BROMIDE 50 MG/5ML IV SOLN
INTRAVENOUS | Status: AC
  Filled 2016-10-18: qty 1

## 2016-10-18 MED ORDER — EPHEDRINE SULFATE 50 MG/ML IJ SOLN
INTRAMUSCULAR | Status: AC
  Filled 2016-10-18: qty 1

## 2016-10-18 MED ORDER — CLONIDINE HCL 0.1 MG PO TABS
0.3000 mg | ORAL_TABLET | Freq: Every day | ORAL | Status: DC
  Administered 2016-10-19: 0.3 mg via ORAL
  Filled 2016-10-18: qty 3

## 2016-10-18 MED ORDER — MORPHINE SULFATE (PF) 2 MG/ML IV SOLN: 2.0000 mg | INTRAVENOUS | Status: DC | PRN

## 2016-10-18 MED ORDER — VITAMIN B-12 1000 MCG PO TABS
1000.0000 ug | ORAL_TABLET | Freq: Every day | ORAL | Status: DC
  Administered 2016-10-18 – 2016-10-19 (×2): 1000 ug via ORAL
  Filled 2016-10-18 (×2): qty 1

## 2016-10-18 MED ORDER — HYDROCHLOROTHIAZIDE 25 MG PO TABS
25.0000 mg | ORAL_TABLET | Freq: Every day | ORAL | Status: DC
  Administered 2016-10-18 – 2016-10-19 (×2): 25 mg via ORAL
  Filled 2016-10-18 (×2): qty 1

## 2016-10-18 MED ORDER — DIPHENHYDRAMINE HCL 50 MG/ML IJ SOLN: 12.5000 mg | Freq: Four times a day (QID) | INTRAMUSCULAR | Status: DC | PRN

## 2016-10-18 MED ORDER — CIPROFLOXACIN IN D5W 400 MG/200ML IV SOLN
400.0000 mg | Freq: Two times a day (BID) | INTRAVENOUS | Status: AC
  Administered 2016-10-18: 400 mg via INTRAVENOUS
  Filled 2016-10-18: qty 200

## 2016-10-18 MED ORDER — CIPROFLOXACIN IN D5W 400 MG/200ML IV SOLN
INTRAVENOUS | Status: AC
  Administered 2016-10-18: 400 mg via INTRAVENOUS
  Filled 2016-10-18: qty 200

## 2016-10-18 MED ORDER — METFORMIN HCL 500 MG PO TABS
500.0000 mg | ORAL_TABLET | Freq: Two times a day (BID) | ORAL | Status: DC
  Administered 2016-10-18 – 2016-10-19 (×2): 500 mg via ORAL
  Filled 2016-10-18 (×2): qty 1

## 2016-10-18 MED ORDER — SUCCINYLCHOLINE CHLORIDE 20 MG/ML IJ SOLN
INTRAMUSCULAR | Status: DC | PRN
  Administered 2016-10-18: 120 mg via INTRAVENOUS

## 2016-10-18 MED ORDER — HYDRALAZINE HCL 50 MG PO TABS
100.0000 mg | ORAL_TABLET | Freq: Three times a day (TID) | ORAL | Status: DC
  Administered 2016-10-18 – 2016-10-19 (×3): 100 mg via ORAL
  Filled 2016-10-18 (×3): qty 2

## 2016-10-18 MED ORDER — MIDAZOLAM HCL 2 MG/2ML IJ SOLN
INTRAMUSCULAR | Status: AC
  Filled 2016-10-18: qty 2

## 2016-10-18 MED ORDER — FINASTERIDE 5 MG PO TABS
5.0000 mg | ORAL_TABLET | Freq: Every day | ORAL | Status: DC
  Administered 2016-10-18 – 2016-10-19 (×2): 5 mg via ORAL
  Filled 2016-10-18 (×2): qty 1

## 2016-10-18 MED ORDER — SODIUM CHLORIDE 0.9 % IV SOLN
INTRAVENOUS | Status: DC
  Administered 2016-10-18 – 2016-10-19 (×3): via INTRAVENOUS

## 2016-10-18 MED ORDER — LOSARTAN POTASSIUM 50 MG PO TABS
100.0000 mg | ORAL_TABLET | Freq: Every day | ORAL | Status: DC
  Administered 2016-10-19: 100 mg via ORAL
  Filled 2016-10-18 (×2): qty 2

## 2016-10-18 MED ORDER — IOTHALAMATE MEGLUMINE 43 % IV SOLN
INTRAVENOUS | Status: DC | PRN
  Administered 2016-10-18: 15 mL via URETHRAL

## 2016-10-18 MED ORDER — PROPOFOL 10 MG/ML IV BOLUS
INTRAVENOUS | Status: AC
  Filled 2016-10-18: qty 20

## 2016-10-18 MED ORDER — OXYBUTYNIN CHLORIDE 5 MG PO TABS
5.0000 mg | ORAL_TABLET | Freq: Three times a day (TID) | ORAL | Status: DC | PRN
Start: 2016-10-18 — End: 2016-10-19

## 2016-10-18 MED ORDER — ONDANSETRON HCL 4 MG/2ML IJ SOLN
INTRAMUSCULAR | Status: DC | PRN
  Administered 2016-10-18: 4 mg via INTRAVENOUS

## 2016-10-18 MED ORDER — MIDAZOLAM HCL 5 MG/5ML IJ SOLN
INTRAMUSCULAR | Status: DC | PRN
  Administered 2016-10-18: 1 mg via INTRAVENOUS

## 2016-10-18 MED ORDER — FAMOTIDINE 20 MG PO TABS
20.0000 mg | ORAL_TABLET | Freq: Once | ORAL | Status: AC
  Administered 2016-10-18: 20 mg via ORAL

## 2016-10-18 MED ORDER — MOMETASONE FURO-FORMOTEROL FUM 200-5 MCG/ACT IN AERO
2.0000 | INHALATION_SPRAY | Freq: Two times a day (BID) | RESPIRATORY_TRACT | Status: DC
  Filled 2016-10-18: qty 8.8

## 2016-10-18 MED ORDER — FAMOTIDINE 20 MG PO TABS
ORAL_TABLET | ORAL | Status: AC
  Administered 2016-10-18: 20 mg via ORAL
  Filled 2016-10-18: qty 1

## 2016-10-18 MED ORDER — LIDOCAINE HCL (CARDIAC) 20 MG/ML IV SOLN
INTRAVENOUS | Status: DC | PRN
  Administered 2016-10-18: 60 mg via INTRAVENOUS

## 2016-10-18 MED ORDER — SUGAMMADEX SODIUM 200 MG/2ML IV SOLN
INTRAVENOUS | Status: AC
  Filled 2016-10-18: qty 2

## 2016-10-18 MED ORDER — TAMSULOSIN HCL 0.4 MG PO CAPS
0.4000 mg | ORAL_CAPSULE | Freq: Every day | ORAL | Status: DC
  Administered 2016-10-18 – 2016-10-19 (×2): 0.4 mg via ORAL
  Filled 2016-10-18 (×2): qty 1

## 2016-10-18 MED ORDER — GLIPIZIDE ER 10 MG PO TB24
10.0000 mg | ORAL_TABLET | Freq: Two times a day (BID) | ORAL | Status: DC
  Administered 2016-10-18 – 2016-10-19 (×2): 10 mg via ORAL
  Filled 2016-10-18 (×2): qty 1

## 2016-10-18 MED ORDER — BELLADONNA ALKALOIDS-OPIUM 16.2-60 MG RE SUPP: 1.0000 | Freq: Four times a day (QID) | RECTAL | Status: DC | PRN

## 2016-10-18 MED ORDER — SUCCINYLCHOLINE CHLORIDE 20 MG/ML IJ SOLN
INTRAMUSCULAR | Status: AC
  Filled 2016-10-18: qty 1

## 2016-10-18 MED ORDER — EPHEDRINE SULFATE 50 MG/ML IJ SOLN
INTRAMUSCULAR | Status: DC | PRN
  Administered 2016-10-18: 10 mg via INTRAVENOUS

## 2016-10-18 MED ORDER — SODIUM CHLORIDE 0.9 % IV SOLN
INTRAVENOUS | Status: DC
  Administered 2016-10-18: 11:00:00 via INTRAVENOUS

## 2016-10-18 MED ORDER — DEXAMETHASONE SODIUM PHOSPHATE 10 MG/ML IJ SOLN
INTRAMUSCULAR | Status: DC | PRN
  Administered 2016-10-18: 5 mg via INTRAVENOUS

## 2016-10-18 MED ORDER — ONDANSETRON HCL 4 MG/2ML IJ SOLN: 4.0000 mg | Freq: Once | INTRAMUSCULAR | Status: DC | PRN

## 2016-10-18 MED ORDER — DIPHENHYDRAMINE HCL 12.5 MG/5ML PO ELIX: 12.5000 mg | ORAL_SOLUTION | Freq: Four times a day (QID) | ORAL | Status: DC | PRN

## 2016-10-18 MED ORDER — FENTANYL CITRATE (PF) 100 MCG/2ML IJ SOLN
INTRAMUSCULAR | Status: DC | PRN
  Administered 2016-10-18 (×2): 25 ug via INTRAVENOUS
  Administered 2016-10-18: 50 ug via INTRAVENOUS

## 2016-10-18 MED ORDER — ATORVASTATIN CALCIUM 20 MG PO TABS
40.0000 mg | ORAL_TABLET | Freq: Every day | ORAL | Status: DC
  Administered 2016-10-19: 40 mg via ORAL
  Filled 2016-10-18: qty 2

## 2016-10-18 MED ORDER — ACETAMINOPHEN 325 MG PO TABS: 650.0000 mg | ORAL_TABLET | ORAL | Status: DC | PRN

## 2016-10-18 MED ORDER — ONDANSETRON HCL 4 MG/2ML IJ SOLN: 4.0000 mg | INTRAMUSCULAR | Status: DC | PRN

## 2016-10-18 MED ORDER — PROPOFOL 10 MG/ML IV BOLUS
INTRAVENOUS | Status: DC | PRN
  Administered 2016-10-18: 150 mg via INTRAVENOUS

## 2016-10-18 MED ORDER — HEPARIN SODIUM (PORCINE) 5000 UNIT/ML IJ SOLN
5000.0000 [IU] | Freq: Three times a day (TID) | INTRAMUSCULAR | Status: DC
  Administered 2016-10-18 – 2016-10-19 (×2): 5000 [IU] via SUBCUTANEOUS
  Filled 2016-10-18 (×2): qty 1

## 2016-10-18 MED ORDER — DOCUSATE SODIUM 100 MG PO CAPS
100.0000 mg | ORAL_CAPSULE | Freq: Two times a day (BID) | ORAL | Status: DC
  Administered 2016-10-18 – 2016-10-19 (×2): 100 mg via ORAL
  Filled 2016-10-18 (×2): qty 1

## 2016-10-18 MED ORDER — ROCURONIUM BROMIDE 100 MG/10ML IV SOLN
INTRAVENOUS | Status: DC | PRN
  Administered 2016-10-18: 5 mg via INTRAVENOUS
  Administered 2016-10-18: 10 mg via INTRAVENOUS

## 2016-10-18 MED ORDER — OXYCODONE HCL 5 MG PO TABS: 5.0000 mg | ORAL_TABLET | ORAL | Status: DC | PRN

## 2016-10-18 MED ORDER — FENTANYL CITRATE (PF) 100 MCG/2ML IJ SOLN
INTRAMUSCULAR | Status: AC
  Filled 2016-10-18: qty 2

## 2016-10-18 SURGICAL SUPPLY — 39 items
ADAPTER IRRIG TUBE 2 SPIKE SOL (ADAPTER) ×10 IMPLANT
BAG DRAIN CYSTO-URO LG1000N (MISCELLANEOUS) ×5 IMPLANT
BAG URO DRAIN 2000ML W/SPOUT (MISCELLANEOUS) IMPLANT
BAG URO DRAIN 4000ML (MISCELLANEOUS) ×5 IMPLANT
CATH FOL 2WAY LX 24X30 (CATHETERS) IMPLANT
CATH FOL LEG HOLDER (MISCELLANEOUS) ×5 IMPLANT
CATH FOLEY 2WAY  5CC 16FR (CATHETERS)
CATH FOLEY 3WAY 30CC 22FR (CATHETERS) ×5 IMPLANT
CATH URETL 5X70 OPEN END (CATHETERS) ×5 IMPLANT
CATH URTH 16FR FL 2W BLN LF (CATHETERS) IMPLANT
CONRAY 43 FOR UROLOGY 50M (MISCELLANEOUS) ×5 IMPLANT
DRAPE UTILITY 15X26 TOWEL STRL (DRAPES) ×5 IMPLANT
DRSG TELFA 4X3 1S NADH ST (GAUZE/BANDAGES/DRESSINGS) ×5 IMPLANT
ELECT LOOP 22F BIPOLAR SML (ELECTROSURGICAL) ×5
ELECT REM PT RETURN 9FT ADLT (ELECTROSURGICAL) ×5
ELECTRODE LOOP 22F BIPOLAR SML (ELECTROSURGICAL) ×3 IMPLANT
ELECTRODE REM PT RTRN 9FT ADLT (ELECTROSURGICAL) ×3 IMPLANT
GLOVE BIO SURGEON STRL SZ 6.5 (GLOVE) ×8 IMPLANT
GLOVE BIO SURGEONS STRL SZ 6.5 (GLOVE) ×2
GOWN STRL REUS W/ TWL LRG LVL3 (GOWN DISPOSABLE) ×6 IMPLANT
GOWN STRL REUS W/TWL LRG LVL3 (GOWN DISPOSABLE) ×4
HOLDER FOLEY CATH W/STRAP (MISCELLANEOUS) ×5 IMPLANT
KIT RM TURNOVER CYSTO AR (KITS) ×5 IMPLANT
LOOP CUT BIPOLAR 24F LRG (ELECTROSURGICAL) IMPLANT
NDL SAFETY ECLIPSE 18X1.5 (NEEDLE) IMPLANT
NEEDLE HYPO 18GX1.5 SHARP (NEEDLE)
PACK CYSTO AR (MISCELLANEOUS) ×5 IMPLANT
SCRUB POVIDONE IODINE 4 OZ (MISCELLANEOUS) ×5 IMPLANT
SENSORWIRE 0.038 NOT ANGLED (WIRE) ×5
SET CYSTO W/LG BORE CLAMP LF (SET/KITS/TRAYS/PACK) IMPLANT
SET IRRIG Y TYPE TUR BLADDER L (SET/KITS/TRAYS/PACK) ×5 IMPLANT
SET IRRIGATING DISP (SET/KITS/TRAYS/PACK) ×5 IMPLANT
SOL .9 NS 3000ML IRR  AL (IV SOLUTION) ×12
SOL .9 NS 3000ML IRR UROMATIC (IV SOLUTION) ×18 IMPLANT
SURGILUBE 2OZ TUBE FLIPTOP (MISCELLANEOUS) ×5 IMPLANT
SYR TOOMEY 50ML (SYRINGE) ×5 IMPLANT
SYRINGE IRR TOOMEY STRL 70CC (SYRINGE) ×10 IMPLANT
WATER STERILE IRR 1000ML POUR (IV SOLUTION) ×5 IMPLANT
WIRE SENSOR 0.038 NOT ANGLED (WIRE) ×3 IMPLANT

## 2016-10-18 NOTE — Op Note (Signed)
Date of procedure: 10/18/16  Preoperative diagnosis:  1. Advance prostate cancer 2. History of urinary retention 3.  Bladder erythema/lesion  Postoperative diagnosis:  1. Same as above  2. Bulbar urethral stricture  Procedure: 1. Cystoscopy 2. Bilateral retrograde pyelogram 3. Bladder biopsy 4. Channel TURP  Surgeon: Hollice Espy, MD  Anesthesia: General  Complications: None  Intraoperative findings: Patchy diffuse bladder erythema without any papillary lesions. Mild bulbar urethral stricture dilated bluntly with scope. Irregular prostatic fossa consistent with advanced prostate cancer.  EBL: Minimal  Specimens: Bladder biopsies, prostate chips  Drains: 31 French three-way Foley catheter  Indication: Michael Mathews is a 71 y.o. patient with advanced prostate cancer on Lupron with a history of urinary retention found to have some suspicious bladder findings.  After reviewing management options for treatment, he elected to proceed with the above surgical procedure(s). We have discussed the potential benefits and risks of the procedure, side effects of the proposed treatment, the likelihood of the patient achieving the goals of the procedure, and any potential problems that might occur during the procedure or recuperation. Informed consent has been obtained.  Description of procedure:  The patient was taken to the operating room and general anesthesia was induced.  The patient was placed in the dorsal lithotomy position, prepped and draped in the usual sterile fashion, and preoperative antibiotics were administered. A preoperative time-out was performed.   A 21 French cystoscope was advanced per urethra into the bladder. On advancement of the scope, he was noted to have a mild bulbar urethral stricture which was somewhat tight but the scope but ultimately able to be passed bluntly. The prosthetic fossa was tight and somewhat irregular consistent with his known history of advanced  prostate cancer. The bladder neck was mildly elevated. The bladder itself was then carefully inspected. It is moderately trabeculated with distention with an unremarkable trigone. There were no obvious papillary lesions throughout the entire bladder although there were diffuse patchy erythematous areas on the posterior wall, left and right lateral wall, and dome of bladder which were somewhat suspicious. These had a somewhat mottled appearance somewhat suspicious for CIS. Attention was first turned to the right ureteral orifice which was cannulated using a open-ended ureteral catheter. Retrograde powder was referred on this side which revealed a delicate appearing ureter without any hydronephrosis or filling defects. Attention was then turned to the contralateral UO and the same exact procedure was performed. This was also unremarkable without any hydroureteronephrosis or filling defects.  Next, call cup biopsy forceps were used to biopsy several of the erythematous patches, a total of 5 biopsies and a variety of locations throughout the bladder. The 76 French resectoscope was then brought in and using the visual obturator and the loop was used to fulgurate each of these locations.  Finally, the bipolar loop was used to take down the bladder neck as well as create a large channel within the prostatic fossa. Kerosene to avoid any resection beyond the vera. Enough tissue was taken to the scope advanced easily back and forth through the prostatic fossa but not down to the level of the capsule. Once a sufficient amount of tissue was resected, careful and adequate hemostasis was achieved. The prostate chips were irrigated from the bladder. The bladder was then drained and the scope was removed. A 22 French three-way Foley catheter was then advanced into the bladder and the balloon was filled with 30 cc of sterile water. Slow drip CBI was initiated. The patient was then cleaned  and dried, repositioned the supine  position, reversed from anesthesia, and taken the PACU in stable condition.  Plan: Patient will be admitted overnight for CBI. We will discharge him home with his Foley catheter in place given the presence of a bulbar urethral stricture.  Hollice Espy, M.D.

## 2016-10-18 NOTE — Progress Notes (Addendum)
Pt has home cpap in room. Pt's machine smelled of smoke and had brown discoloration. Pt's tubing and mask had tape around the hose. Pt's water chamber needs to be replaced.

## 2016-10-18 NOTE — Interval H&P Note (Signed)
History and Physical Interval Note:  10/18/2016 10:38 AM  Michael Mathews  has presented today for surgery, with the diagnosis of obstructing,urine retention,prostate cancer,bladder tumor  The various methods of treatment have been discussed with the patient and family. After consideration of risks, benefits and other options for treatment, the patient has consented to  Procedure(s): TRANSURETHRAL RESECTION OF THE PROSTATE (TURP) CHANNEL TURP (N/A) TRANSURETHRAL RESECTION OF BLADDER TUMOR (TURBT) (N/A) CYSTOSCOPY WITH RETROGRADE PYELOGRAM (Bilateral) as a surgical intervention .  The patient's history has been reviewed, patient examined, no change in status, stable for surgery.  I have reviewed the patient's chart and labs.  Questions were answered to the patient's satisfaction.    CTAB RRR  Hollice Espy

## 2016-10-18 NOTE — H&P (View-Only) (Signed)
   10/04/16  CC:  Chief Complaint  Patient presents with  . Cysto    urinary retention    HPI: 71 year old male with a history of advanced prostate cancer and obstructive oiding symptoms presents today for cystoscopy secondary to microscopic hematuria. 2 weeks prior the patient presented to the emergency room for urinary retention and a Foley catheter was placed. The catheter has been in situ since, and the patient has been tolerating it reasonably well.  This is the second episode of urinary retention for this patient.  Blood pressure (!) 142/74, pulse 66, height 5\' 10"  (1.778 m), weight 100.9 kg (222 lb 6.4 oz). NED. A&Ox3.   No respiratory distress   Abd soft, NT, ND Normal phallus with bilateral descended testicles  Cystoscopy Procedure Note  Patient identification was confirmed, informed consent was obtained, and patient was prepped using Betadine solution.  Lidocaine jelly was administered per urethral meatus.    Preoperative abx where received prior to procedure.     Pre-Procedure: - Inspection reveals a normal caliber ureteral meatus.  Procedure: The flexible cystoscope was introduced without difficulty The patient had a lesion on the anterior aspect of the apical prosthetic urethra consistent with locally advanced prostate cancer. He had obstructing lateral lobes and a long urethra He had bullous edema within his bladder consistent with catheter associated irritation. There was an area on the posterior bladder wall on the right hand side that appeared to resemble more a transitional cell carcinoma then bullous edema. There is also an area on the left lateral wall that was concerning. Together, these lesions measure approximately 2 cm. The bladder was slightly trabeculated, ureteral orifice was orthotopic.   Post-Procedure: - Patient tolerated the procedure well  Assessment/ Plan:  I recommended that the patient consider a channel TURP for his obstruction as well as a  TURBT for the concerning lesion seen in his bladder today.  The patient was given a voiding trial concurrently with cystoscopine was instilled in the patient's bladder and 75 voided. The patient was counseled on returning to the emergency department if he is unable to void upon discharge, but we will leave his Foley catheter out.  We will get him scheduled for follow-up surgery as soon as possible. The patient should be cleared by his primary care provider before proceeding to the operating room.

## 2016-10-18 NOTE — Anesthesia Preprocedure Evaluation (Signed)
Anesthesia Evaluation  Patient identified by MRN, date of birth, ID band Patient awake    Reviewed: Allergy & Precautions, Patient's Chart, lab work & pertinent test results  Airway Mallampati: II       Dental  (+) Upper Dentures, Lower Dentures   Pulmonary sleep apnea , COPD, Current Smoker,     + decreased breath sounds      Cardiovascular Exercise Tolerance: Poor hypertension, Pt. on medications and Pt. on home beta blockers + CAD, + Orthopnea and + DOE   Rhythm:Regular     Neuro/Psych  Headaches,    GI/Hepatic negative GI ROS, Neg liver ROS,   Endo/Other  diabetes, Type 2, Oral Hypoglycemic Agents  Renal/GU      Musculoskeletal   Abdominal (+) + obese,   Peds  Hematology   Anesthesia Other Findings   Reproductive/Obstetrics                             Anesthesia Physical Anesthesia Plan  ASA: III  Anesthesia Plan: General   Post-op Pain Management:    Induction: Intravenous  Airway Management Planned: Oral ETT  Additional Equipment:   Intra-op Plan:   Post-operative Plan: Extubation in OR  Informed Consent: I have reviewed the patients History and Physical, chart, labs and discussed the procedure including the risks, benefits and alternatives for the proposed anesthesia with the patient or authorized representative who has indicated his/her understanding and acceptance.     Plan Discussed with: CRNA  Anesthesia Plan Comments:         Anesthesia Quick Evaluation

## 2016-10-18 NOTE — Anesthesia Post-op Follow-up Note (Cosign Needed)
Anesthesia QCDR form completed.        

## 2016-10-18 NOTE — OR Nursing (Signed)
Patient states that he has been unable to locate his Metformin bottle.  He does not know when he last took the medicine.  I called [facility] and spoke to a pharmacy tech to find out when he last filled this script and it was on 08/23/16.  He received a 90 day supply at that time.  Will inform patient to continue to search for his medicine and if unable to locate to contact his  PCP once home.  Patient also takes Glipizide and has been taking this as ordered.

## 2016-10-18 NOTE — Transfer of Care (Signed)
Immediate Anesthesia Transfer of Care Note  Patient: YOSHUA [facility]  Procedure(s) Performed: Procedure(s): TRANSURETHRAL RESECTION OF THE PROSTATE (TURP) CHANNEL TURP (N/A) TRANSURETHRAL RESECTION OF BLADDER TUMOR (TURBT) (N/A) CYSTOSCOPY WITH RETROGRADE PYELOGRAM (Bilateral)  Patient Location: PACU  Anesthesia Type:General  Level of Consciousness: sedated  Airway & Oxygen Therapy: Patient Spontanous Breathing and Patient connected to face mask oxygen  Post-op Assessment: Report given to RN and Post -op Vital signs reviewed and stable  Post vital signs: Reviewed and stable  Last Vitals:  Vitals:   10/18/16 1035 10/18/16 1215  BP: (!) 160/81 (!) 150/69  Pulse: 62 64  Resp:  15  Temp: 36.7 C 36.3 C    Last Pain:  Vitals:   10/18/16 1215  TempSrc:   PainSc: Asleep         Complications: No apparent anesthesia complications

## 2016-10-18 NOTE — Brief Op Note (Signed)
10/18/2016  12:46 PM  PATIENT:  Michael Mathews  71 y.o. male  PRE-OPERATIVE DIAGNOSIS:  obstructing,urine retention,prostate cancer,bladder tumor  POST-OPERATIVE DIAGNOSIS:  obstructing,urine retention,prostate cancer,bladder tumor  PROCEDURE:  Procedure(s): TRANSURETHRAL RESECTION OF THE PROSTATE (TURP) CHANNEL TURP (N/A) TRANSURETHRAL RESECTION OF BLADDER TUMOR (TURBT) (N/A) CYSTOSCOPY WITH RETROGRADE PYELOGRAM (Bilateral)  SURGEON:  Surgeon(s) and Role:    Hollice Espy, MD - Primary  ASSISTANTS: none   ANESTHESIA:   general  EBL:  Total I/O In: 600 [I.V.:600] Out: -   Drains: 22 Fr Foley (3 way on CBI)  Specimen: bladder erythema, prostate chips  COUNTS CORRECT: YES  PLAN OF CARE: Admit for overnight observation  PATIENT DISPOSITION:  PACU - hemodynamically stable.

## 2016-10-18 NOTE — Anesthesia Postprocedure Evaluation (Signed)
Anesthesia Post Note  Patient: Michael Mathews  Procedure(s) Performed: Procedure(s) (LRB): TRANSURETHRAL RESECTION OF THE PROSTATE (TURP) CHANNEL TURP (N/A) TRANSURETHRAL RESECTION OF BLADDER TUMOR (TURBT) (N/A) CYSTOSCOPY WITH RETROGRADE PYELOGRAM (Bilateral)  Patient location during evaluation: PACU Anesthesia Type: General Level of consciousness: awake Pain management: pain level controlled Vital Signs Assessment: post-procedure vital signs reviewed and stable Respiratory status: spontaneous breathing Cardiovascular status: stable Anesthetic complications: no     Last Vitals:  Vitals:   10/18/16 1230 10/18/16 1245  BP: (!) 177/78 (!) 177/81  Pulse: 66 65  Resp: 16 16  Temp:      Last Pain:  Vitals:   10/18/16 1230  TempSrc:   PainSc: Asleep                 VAN STAVEREN,Aliea Bobe

## 2016-10-18 NOTE — Anesthesia Procedure Notes (Signed)
Procedure Name: Intubation Date/Time: 10/18/2016 11:09 AM Performed by: Dionne Bucy Pre-anesthesia Checklist: Patient identified, Patient being monitored, Timeout performed, Emergency Drugs available and Suction available Patient Re-evaluated:Patient Re-evaluated prior to inductionOxygen Delivery Method: Circle system utilized Preoxygenation: Pre-oxygenation with 100% oxygen Intubation Type: IV induction Ventilation: Mask ventilation without difficulty Laryngoscope Size: Mac and 4 Grade View: Grade I Tube type: Oral Tube size: 7.5 mm Number of attempts: 1 Airway Equipment and Method: Stylet Placement Confirmation: ETT inserted through vocal cords under direct vision,  positive ETCO2 and breath sounds checked- equal and bilateral Secured at: 23 cm Tube secured with: Tape Dental Injury: Teeth and Oropharynx as per pre-operative assessment

## 2016-10-19 DIAGNOSIS — C61 Malignant neoplasm of prostate: Secondary | ICD-10-CM | POA: Diagnosis not present

## 2016-10-19 LAB — CBC
HCT: 30.6 % — ABNORMAL LOW (ref 40.0–52.0)
Hemoglobin: 10.3 g/dL — ABNORMAL LOW (ref 13.0–18.0)
MCH: 31.9 pg (ref 26.0–34.0)
MCHC: 33.6 g/dL (ref 32.0–36.0)
MCV: 95.1 fL (ref 80.0–100.0)
Platelets: 170 10*3/uL (ref 150–440)
RBC: 3.22 MIL/uL — ABNORMAL LOW (ref 4.40–5.90)
RDW: 15.8 % — ABNORMAL HIGH (ref 11.5–14.5)
WBC: 7.8 10*3/uL (ref 3.8–10.6)

## 2016-10-19 LAB — BASIC METABOLIC PANEL
Anion gap: 6 (ref 5–15)
BUN: 20 mg/dL (ref 6–20)
CALCIUM: 8.3 mg/dL — AB (ref 8.9–10.3)
CO2: 28 mmol/L (ref 22–32)
CREATININE: 0.82 mg/dL (ref 0.61–1.24)
Chloride: 106 mmol/L (ref 101–111)
GFR calc non Af Amer: >60 mL/min (ref >60)
Glucose, Bld: 66 mg/dL (ref 65–99)
Potassium: 3.9 mmol/L (ref 3.5–5.1)
SODIUM: 140 mmol/L (ref 135–145)

## 2016-10-19 MED ORDER — OXYCODONE HCL 5 MG PO TABS: 5.0000 mg | ORAL_TABLET | Freq: Four times a day (QID) | ORAL | 0 refills | Status: DC | PRN

## 2016-10-19 NOTE — Care Management Obs Status (Signed)
[place] NOTIFICATION   Patient Details  Name: Michael Mathews MRN: [id number] Date of Birth: January 18, 1946   Medicare Observation Status Notification Given:  Yes    Shelbie Ammons, RN 10/19/2016, 10:26 AM

## 2016-10-19 NOTE — Care Management (Signed)
Admitted to [facility] under observation status with the diagnosis of urinary retention. TURP 10/18/16. Lives alone. Friend and neighbor is Ninfa Meeker [phone]). Last seen Dr. Clide Deutscher a couple of months ago. Prescriptions are filled at [facility]. Takes care of all basic activities of daily living himself, doesn't drive. Friend and his wife help with errands. Home Health 3 months ago. Doesn't remember name of agency. No skilled nursing. Home oxygen at night 2 liters per nasal cannula. Oxygen provided by [place] [facility] for a long time. CPAP in the home. Last fall was 2 years ago. Good appetite. Friend will transport. Foley in place. Shelbie Ammons RN MSN CCM Care Management [phone]

## 2016-10-19 NOTE — Discharge Summary (Signed)
Date of admission: 10/18/2016  Date of discharge: 10/19/2016  Admission diagnosis: advanced prostate cancer, urinary retention and bladder erythema/lesions  Discharge diagnosis: Status Post Channel TURP, bladder biopsy, RTG's and cystoscopy Bulbar urethral stricture, advanced prostate cancer, history of urinary retention and bladder erythema/lesions  Secondary diagnoses:  Patient Active Problem List   Diagnosis Date Noted  . Prostate cancer metastatic to bone ([place]) 07/28/2016  . Goals of care, counseling/discussion 07/28/2016  . Elevated PSA 05/01/2016  . Urinary retention 05/01/2016  . Pneumonia 03/23/2016  . ARF (acute renal failure) ([place]) 03/11/2016  . Dizziness 03/03/2016  . Mixed hyperlipidemia 09/01/2015  . OSA (obstructive sleep apnea) 09/01/2015  . Atherosclerotic peripheral vascular disease with intermittent claudication ([place]) 04/16/2015  . Lumbar radiculitis 08/13/2014  . Lumbar stenosis with neurogenic claudication 08/13/2014  . Cardiomyopathy ([place]) 01/23/2013  . DOE (dyspnea on exertion) 01/23/2013  . Hyperlipidemia 01/23/2013  . PVC (premature ventricular contraction) 01/23/2013  . Nonspecific elevation of levels of transaminase and lactic acid dehydrogenase (ldh) 07/21/2011  . Type 2 diabetes mellitus without complications ([place]) 57/84/6962  . Sciatica 06/28/2010  . Chronic obstructive pulmonary disease ([place]) 12/02/2008  . Essential (primary) hypertension 09/05/2007  . Personal history of transient ischemic attack (TIA), and cerebral infarction without residual deficits 09/05/2007    History and Physical: For full details, please see admission history and physical. Briefly, Michael Mathews is a 71 y.o. year old patient with advanced prostate cancer, urinary retention and bladder erythema/lesions underwent channel TURP and bladder biopsies on 10/18/2016.  He is placed on CBI overnight. CBI was discontinued this a.m. and patient will be discharged home with Foley in place    Hospital Course: Patient tolerated the procedure well.  He was then transferred to the floor after an uneventful PACU stay.  His hospital course was uncomplicated.  On POD#1 he had met discharge criteria: was eating a regular diet, was up and ambulating independently,  pain was well controlled, was voiding without a catheter, and was ready to for discharge.   Laboratory values:   Recent Labs  10/19/16 0516  WBC 7.8  HGB 10.3*  HCT 30.6*    Recent Labs  10/19/16 0516  NA 140  K 3.9  CL 106  CO2 28  GLUCOSE 66  BUN 20  CREATININE 0.82  CALCIUM 8.3*   No results for input(s): LABPT, INR in the last 72 hours. No results for input(s): LABURIN in the last 72 hours. Results for orders placed or performed during the hospital encounter of 10/12/16  Urine culture     Status: None   Collection Time: 10/12/16 10:39 AM  Result Value Ref Range Status   Specimen Description URINE, CLEAN CATCH  Final   Special Requests NONE  Final   Culture   Final    NO GROWTH Performed at [facility], [place] [address] [place], [place] [address]    Report Status 10/13/2016 FINAL  Final   Constitutional: Well nourished. Alert and oriented, No acute distress. HEENT: [place] AT, moist mucus membranes. Trachea midline, no masses. Cardiovascular: No clubbing, cyanosis, or edema. Respiratory: Normal respiratory effort, no increased work of breathing. Skin: No rashes, bruises or suspicious lesions. Lymph: No cervical or inguinal adenopathy. Neurologic: Grossly intact, no focal deficits, moving all 4 extremities. Psychiatric: Normal mood and affect.  Disposition: Home  Discharge instruction: The patient was instructed to be ambulatory but told to refrain from heavy lifting, strenuous activity, or driving.   Discharge medications: Allergies as of 10/19/2016  No Known Allergies     Medication List    TAKE these medications   aspirin EC 325 MG tablet Take 325 mg by mouth every morning.    atorvastatin 40 MG tablet Commonly known as:  LIPITOR Take 1 tablet (40 mg total) by mouth daily.   bicalutamide 50 MG tablet Commonly known as:  CASODEX Take 1 tablet (50 mg total) by mouth daily.   cloNIDine 0.3 MG tablet Commonly known as:  CATAPRES Take 0.3 mg by mouth daily.   DULoxetine 30 MG capsule Commonly known as:  CYMBALTA Take 30 mg by mouth daily.   ferrous sulfate 325 (65 FE) MG EC tablet Take 1 tablet (325 mg total) by mouth 2 (two) times daily with a meal.   finasteride 5 MG tablet Commonly known as:  PROSCAR Take 5 mg by mouth daily.   Fluticasone-Salmeterol 250-50 MCG/DOSE Aepb Commonly known as:  ADVAIR Inhale 1 puff into the lungs 2 (two) times daily.   GLIPIZIDE XL 10 MG 24 hr tablet Generic drug:  glipiZIDE Take 10 mg by mouth 2 (two) times daily.   hydrALAZINE 100 MG tablet Commonly known as:  APRESOLINE 3 (three) times daily.   hydrochlorothiazide 25 MG tablet Commonly known as:  HYDRODIURIL Take 25 mg by mouth daily.   losartan 100 MG tablet Commonly known as:  COZAAR Take 100 mg by mouth daily.   metFORMIN 500 MG tablet Commonly known as:  GLUCOPHAGE Take 500 mg by mouth 2 (two) times daily with a meal.   metoprolol 50 MG tablet Commonly known as:  LOPRESSOR Take 50 mg by mouth 2 (two) times daily.   oxyCODONE 5 MG immediate release tablet Commonly known as:  Oxy IR/ROXICODONE Take 1 tablet (5 mg total) by mouth every 6 (six) hours as needed for moderate pain.   tamsulosin 0.4 MG Caps capsule Commonly known as:  FLOMAX Take 1 capsule (0.4 mg total) by mouth daily.   vitamin B-12 1000 MCG tablet Commonly known as:  CYANOCOBALAMIN Take 1 tablet (1,000 mcg total) by mouth daily.       Followup:  Follow-up Information    Hollice Espy, MD. Go on 10/26/2016.   Specialty:  Urology Why:  Thursday at 9:30am for Foley removal Contact information: [address] [place] [phone]

## 2016-10-19 NOTE — Progress Notes (Signed)
Urology Consult Follow Up  Subjective: Patient is found sleeping, but he was easily aroused. He states he had a good night.  Staff states the urine in the tube has been clear. CBI found at a very low titration this morning.  Urine is light pink in the collection bag.  Patient is HTN, but afebrile.   Reviewing past records, this appears to be his baseline BP.  Hbg is 10.3, but this is actually improved from two months ago when it was 8.o two months ago.  Cr 0.82.  UOP is good.    Anti-infectives: Anti-infectives    Start     Dose/Rate Route Frequency Ordered Stop   10/18/16 2200  ciprofloxacin (CIPRO) IVPB 400 mg     400 mg 200 mL/hr over 60 Minutes Intravenous Every 12 hours 10/18/16 1340 10/18/16 2241   10/17/16 2248  ciprofloxacin (CIPRO) IVPB 400 mg     400 mg 200 mL/hr over 60 Minutes Intravenous 60 min pre-op 10/17/16 2248 10/18/16 1156      Current Facility-Administered Medications  Medication Dose Route Frequency Provider Last Rate Last Dose  . 0.9 %  sodium chloride infusion   Intravenous Continuous Hollice Espy, MD 125 mL/hr at 10/19/16 0300    . acetaminophen (TYLENOL) tablet 650 mg  650 mg Oral Q4H PRN Hollice Espy, MD      . atorvastatin (LIPITOR) tablet 40 mg  40 mg Oral Daily Hollice Espy, MD      . bicalutamide (CASODEX) tablet 50 mg  50 mg Oral Daily Hollice Espy, MD      . cloNIDine (CATAPRES) tablet 0.3 mg  0.3 mg Oral Daily Hollice Espy, MD      . diphenhydrAMINE (BENADRYL) injection 12.5 mg  12.5 mg Intravenous Q6H PRN Hollice Espy, MD       Or  . diphenhydrAMINE (BENADRYL) 12.5 MG/5ML elixir 12.5 mg  12.5 mg Oral Q6H PRN Hollice Espy, MD      . docusate sodium (COLACE) capsule 100 mg  100 mg Oral BID Hollice Espy, MD   100 mg at 10/18/16 2141  . DULoxetine (CYMBALTA) DR capsule 30 mg  30 mg Oral Daily Hollice Espy, MD      . ferrous sulfate tablet 325 mg  325 mg Oral BID WC Hollice Espy, MD   325 mg at 10/18/16 1717  . finasteride  (PROSCAR) tablet 5 mg  5 mg Oral Daily Hollice Espy, MD   5 mg at 10/18/16 1446  . glipiZIDE (GLUCOTROL XL) 24 hr tablet 10 mg  10 mg Oral BID WC Hollice Espy, MD   10 mg at 10/18/16 1717  . heparin injection 5,000 Units  5,000 Units Subcutaneous Q8H Hollice Espy, MD   5,000 Units at 10/19/16 0537  . hydrALAZINE (APRESOLINE) tablet 100 mg  100 mg Oral Q8H Hollice Espy, MD   100 mg at 10/19/16 0537  . hydrochlorothiazide (HYDRODIURIL) tablet 25 mg  25 mg Oral Daily Hollice Espy, MD   25 mg at 10/18/16 1446  . losartan (COZAAR) tablet 100 mg  100 mg Oral Daily Hollice Espy, MD      . metFORMIN (GLUCOPHAGE) tablet 500 mg  500 mg Oral BID WC Hollice Espy, MD   500 mg at 10/18/16 1717  . metoprolol (LOPRESSOR) tablet 50 mg  50 mg Oral BID Hollice Espy, MD   50 mg at 10/18/16 2141  . mometasone-formoterol (DULERA) 200-5 MCG/ACT inhaler 2 puff  2 puff Inhalation BID Hollice Espy, MD      .  morphine 2 MG/ML injection 2-4 mg  2-4 mg Intravenous Q2H PRN Hollice Espy, MD      . ondansetron [facility]) injection 4 mg  4 mg Intravenous Q4H PRN Hollice Espy, MD      . opium-belladonna (B&O SUPPRETTES) 16.2-60 MG suppository 1 suppository  1 suppository Rectal Q6H PRN Hollice Espy, MD      . oxybutynin (DITROPAN) tablet 5 mg  5 mg Oral Q8H PRN Hollice Espy, MD      . oxyCODONE (Oxy IR/ROXICODONE) immediate release tablet 5 mg  5 mg Oral Q4H PRN Hollice Espy, MD      . tamsulosin [facility]) capsule 0.4 mg  0.4 mg Oral Daily Hollice Espy, MD   0.4 mg at 10/18/16 1446  . vitamin B-12 (CYANOCOBALAMIN) tablet 1,000 mcg  1,000 mcg Oral Daily Hollice Espy, MD   1,000 mcg at 10/18/16 1446     Objective: Vital signs in last 24 hours: Temp:  [97.2 F (36.2 C)-98.5 F (36.9 C)] 97.7 F (36.5 C) (05/10 0523) Pulse Rate:  [62-76] 70 (05/10 0523) Resp:  [14-20] 16 (05/10 0523) BP: (145-188)/(62-81) 188/67 (05/10 0523) SpO2:  [90 %-100 %] 93 % (05/10 0523) Weight:  [222 lb  (100.7 kg)-229 lb 6.4 oz (104.1 kg)] 229 lb 6.4 oz (104.1 kg) (05/09 1354)  Intake/Output from previous day: 05/09 0701 - 05/10 0700 In: 6305.8 [P.O.:240; I.V.:2465.8; IV Piggyback:200] Out: 8500 [Urine:8500] Intake/Output this shift: Total I/O In: 208 [I.V.:208] Out: 700 [Urine:700]   Physical Exam Constitutional: Well nourished. Alert and oriented, No acute distress. HEENT: [place] AT, moist mucus membranes. Trachea midline, no masses. Cardiovascular: No clubbing, cyanosis, or edema. Respiratory: Normal respiratory effort, no increased work of breathing. GI: Abdomen is soft, non tender, non distended, no abdominal masses. Liver and spleen not palpable.  No hernias appreciated.  Stool sample for occult testing is not indicated.   GU: No CVA tenderness.  No bladder fullness or masses.  Foley in place.   Skin: No rashes, bruises or suspicious lesions. Lymph: No cervical or inguinal adenopathy. Neurologic: Grossly intact, no focal deficits, moving all 4 extremities. Psychiatric: Normal mood and affect.  Lab Results:   Recent Labs  10/19/16 0516  WBC 7.8  HGB 10.3*  HCT 30.6*  PLT 170   BMET  Recent Labs  10/19/16 0516  NA 140  K 3.9  CL 106  CO2 28  GLUCOSE 66  BUN 20  CREATININE 0.82  CALCIUM 8.3*   PT/INR No results for input(s): LABPROT, INR in the last 72 hours. ABG No results for input(s): PHART, HCO3 in the last 72 hours.  Invalid input(s): PCO2, PO2  Studies/Results: No results found.   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  1. Urinary retention  - s/p channel TURP and dilation of bulbar stricture on 10/18/2016  - CBI is turned off this am in preparation for possible discharge this afternoon with  Foley  2. Urethral stricture  - patient will need to be discharged with the Foley, he lives alone but has had an indwelling Foley several times in the past, comfortable with Foley care  - follow up in the office for Foley removal in one week  3. 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 Stage IV Prostate Cancer  - managed by Dr. [name] at the [facility]        LOS: 0 days    [facility] 10/19/2016

## 2016-10-19 NOTE — Progress Notes (Addendum)
Discharge order received. Patient is alert and oriented. Vital signs stable . No signs of acute distress. Discharge instructions given. Teaching given regarding foley catheter ceare. Patient verbalized understanding. No other issues noted at this time. Transported by Psychologist, occupational.

## 2016-10-19 NOTE — Progress Notes (Signed)
Inpatient Diabetes Program Recommendations  AACE/ADA: New Consensus Statement on Inpatient Glycemic Control (2015)  Target Ranges:  Prepandial:   less than 140 mg/dL      Peak postprandial:   less than 180 mg/dL (1-2 hours)      Critically ill patients:  140 - 180 mg/dL  Results for ARDIAN, HABER[place] (MRN [id number]) as of 10/19/2016 09:26  Ref. Range 10/19/2016 05:16  Glucose Latest Ref Range: 65 - 99 mg/dL 66   Results for RAE, SUTCLIFFE (MRN [id number]) as of 10/19/2016 09:26  Ref. Range 10/18/2016 09:54 10/18/2016 12:46  Glucose-Capillary Latest Ref Range: 65 - 99 mg/dL 96 99    Review of Glycemic Control  Diabetes history: DM2 Outpatient Diabetes medications: Glipizide XL 10 mg BID, Metformin 500 mg BID Current orders for Inpatient glycemic control: Glipizide XL 10 mg BID, Metformin 500 mg BID  Inpatient Diabetes Program Recommendations: Correction (SSI): While inpatient, please consider ordering CBGs with Novolog 0-9 units TID with meals and Novolog 0-5 units QHS. Oral Agents: While inpatient, discontinue Glipizide and Metformin. A1C: Please consider ordering an A1C to evaluate glycemic control over the past 2-3 months.  Thanks, Barnie Alderman, RN, MSN, CDE Diabetes Coordinator Inpatient Diabetes Program [phone] (Team Pager from 8am to 5pm)

## 2016-10-24 LAB — SURGICAL PATHOLOGY

## 2016-10-26 ENCOUNTER — Ambulatory Visit: Payer: Medicare Other

## 2016-10-26 DIAGNOSIS — C61 Malignant neoplasm of prostate: Secondary | ICD-10-CM

## 2016-10-26 NOTE — Progress Notes (Signed)
Catheter Removal  Patient is present today for a catheter removal.  69ml of water was drained from the balloon. A 22FR foley cath was removed from the bladder no complications were noted . Patient tolerated well.  Preformed by: C.Corinna Capra, CMA

## 2016-11-21 ENCOUNTER — Ambulatory Visit
Admission: RE | Admit: 2016-11-21 | Discharge: 2016-11-21 | Disposition: A | Discharge status: Home / Self Care | Payer: Medicare Other | Source: Ambulatory Visit | Attending: Oncology | Admitting: Oncology

## 2016-11-21 ENCOUNTER — Inpatient Hospital Stay: Payer: Medicare Other | Attending: Oncology

## 2016-11-21 DIAGNOSIS — R911 Solitary pulmonary nodule: Secondary | ICD-10-CM | POA: Diagnosis not present

## 2016-11-21 DIAGNOSIS — F17210 Nicotine dependence, cigarettes, uncomplicated: Secondary | ICD-10-CM | POA: Diagnosis not present

## 2016-11-21 DIAGNOSIS — C7951 Secondary malignant neoplasm of bone: Secondary | ICD-10-CM | POA: Diagnosis not present

## 2016-11-21 DIAGNOSIS — Z79818 Long term (current) use of other agents affecting estrogen receptors and estrogen levels: Secondary | ICD-10-CM | POA: Insufficient documentation

## 2016-11-21 DIAGNOSIS — C61 Malignant neoplasm of prostate: Secondary | ICD-10-CM | POA: Insufficient documentation

## 2016-11-21 DIAGNOSIS — Z79899 Other long term (current) drug therapy: Secondary | ICD-10-CM | POA: Insufficient documentation

## 2016-11-21 DIAGNOSIS — D649 Anemia, unspecified: Secondary | ICD-10-CM | POA: Diagnosis not present

## 2016-11-21 DIAGNOSIS — Z5181 Encounter for therapeutic drug level monitoring: Secondary | ICD-10-CM | POA: Insufficient documentation

## 2016-11-21 MED ORDER — LEUPROLIDE ACETATE (3 MONTH) 22.5 MG IM KIT
22.5000 mg | PACK | Freq: Once | INTRAMUSCULAR | Status: AC
  Administered 2016-11-21: 22.5 mg via INTRAMUSCULAR
  Filled 2016-11-21: qty 22.5

## 2016-11-27 ENCOUNTER — Ambulatory Visit
Admission: RE | Admit: 2016-11-27 | Discharge: 2016-11-27 | Disposition: A | Discharge status: Home / Self Care | Payer: Medicare Other | Source: Ambulatory Visit | Attending: Oncology | Admitting: Oncology

## 2016-11-27 DIAGNOSIS — C7951 Secondary malignant neoplasm of bone: Secondary | ICD-10-CM | POA: Insufficient documentation

## 2016-11-27 DIAGNOSIS — I712 Thoracic aortic aneurysm, without rupture: Secondary | ICD-10-CM | POA: Insufficient documentation

## 2016-11-27 DIAGNOSIS — J439 Emphysema, unspecified: Secondary | ICD-10-CM | POA: Diagnosis not present

## 2016-11-27 DIAGNOSIS — D71 Functional disorders of polymorphonuclear neutrophils: Secondary | ICD-10-CM | POA: Diagnosis not present

## 2016-11-27 DIAGNOSIS — I719 Aortic aneurysm of unspecified site, without rupture: Secondary | ICD-10-CM | POA: Insufficient documentation

## 2016-11-27 DIAGNOSIS — I700 Atherosclerosis of aorta: Secondary | ICD-10-CM | POA: Diagnosis not present

## 2016-11-27 DIAGNOSIS — R911 Solitary pulmonary nodule: Secondary | ICD-10-CM | POA: Insufficient documentation

## 2016-11-27 DIAGNOSIS — C61 Malignant neoplasm of prostate: Secondary | ICD-10-CM

## 2016-11-27 MED ORDER — IOPAMIDOL (ISOVUE-300) INJECTION 61%
75.0000 mL | Freq: Once | INTRAVENOUS | Status: AC | PRN
  Administered 2016-11-27: 75 mL via INTRAVENOUS

## 2016-12-11 ENCOUNTER — Other Ambulatory Visit: Payer: Self-pay | Admitting: *Deleted

## 2016-12-11 DIAGNOSIS — Z8546 Personal history of malignant neoplasm of prostate: Secondary | ICD-10-CM

## 2016-12-12 ENCOUNTER — Encounter: Payer: Self-pay | Admitting: Oncology

## 2016-12-12 ENCOUNTER — Inpatient Hospital Stay: Payer: Medicare Other

## 2016-12-12 ENCOUNTER — Inpatient Hospital Stay: Payer: Medicare Other | Attending: Oncology | Admitting: Oncology

## 2016-12-12 VITALS — BP 133/81 | HR 60 | Temp 97.0°F | Resp 20 | Wt 222.0 lb

## 2016-12-12 DIAGNOSIS — C7951 Secondary malignant neoplasm of bone: Secondary | ICD-10-CM | POA: Insufficient documentation

## 2016-12-12 DIAGNOSIS — I2510 Atherosclerotic heart disease of native coronary artery without angina pectoris: Secondary | ICD-10-CM | POA: Insufficient documentation

## 2016-12-12 DIAGNOSIS — D649 Anemia, unspecified: Secondary | ICD-10-CM | POA: Insufficient documentation

## 2016-12-12 DIAGNOSIS — G4730 Sleep apnea, unspecified: Secondary | ICD-10-CM | POA: Diagnosis not present

## 2016-12-12 DIAGNOSIS — C61 Malignant neoplasm of prostate: Secondary | ICD-10-CM | POA: Insufficient documentation

## 2016-12-12 DIAGNOSIS — Z7984 Long term (current) use of oral hypoglycemic drugs: Secondary | ICD-10-CM | POA: Insufficient documentation

## 2016-12-12 DIAGNOSIS — I10 Essential (primary) hypertension: Secondary | ICD-10-CM | POA: Insufficient documentation

## 2016-12-12 DIAGNOSIS — Z8546 Personal history of malignant neoplasm of prostate: Secondary | ICD-10-CM

## 2016-12-12 DIAGNOSIS — Z79899 Other long term (current) drug therapy: Secondary | ICD-10-CM | POA: Diagnosis not present

## 2016-12-12 DIAGNOSIS — J449 Chronic obstructive pulmonary disease, unspecified: Secondary | ICD-10-CM | POA: Diagnosis not present

## 2016-12-12 DIAGNOSIS — Z7982 Long term (current) use of aspirin: Secondary | ICD-10-CM | POA: Insufficient documentation

## 2016-12-12 DIAGNOSIS — R911 Solitary pulmonary nodule: Secondary | ICD-10-CM | POA: Diagnosis not present

## 2016-12-12 DIAGNOSIS — E119 Type 2 diabetes mellitus without complications: Secondary | ICD-10-CM | POA: Insufficient documentation

## 2016-12-12 DIAGNOSIS — K4020 Bilateral inguinal hernia, without obstruction or gangrene, not specified as recurrent: Secondary | ICD-10-CM | POA: Insufficient documentation

## 2016-12-12 DIAGNOSIS — I712 Thoracic aortic aneurysm, without rupture: Secondary | ICD-10-CM

## 2016-12-12 DIAGNOSIS — F17210 Nicotine dependence, cigarettes, uncomplicated: Secondary | ICD-10-CM | POA: Diagnosis not present

## 2016-12-12 DIAGNOSIS — I700 Atherosclerosis of aorta: Secondary | ICD-10-CM | POA: Insufficient documentation

## 2016-12-12 LAB — CBC WITH DIFFERENTIAL/PLATELET
Basophils Absolute: 0.2 10*3/uL — ABNORMAL HIGH (ref 0–0.1)
Basophils Relative: 4 %
Eosinophils Absolute: 0 10*3/uL (ref 0–0.7)
Eosinophils Relative: 1 %
HEMATOCRIT: 34.5 % — AB (ref 40.0–52.0)
HEMOGLOBIN: 11.6 g/dL — AB (ref 13.0–18.0)
LYMPHS ABS: 0.7 10*3/uL — AB (ref 1.0–3.6)
LYMPHS PCT: 12 %
MCH: 31.9 pg (ref 26.0–34.0)
MCHC: 33.6 g/dL (ref 32.0–36.0)
MCV: 94.9 fL (ref 80.0–100.0)
MONO ABS: 0.6 10*3/uL (ref 0.2–1.0)
MONOS PCT: 11 %
NEUTROS ABS: 4.2 10*3/uL (ref 1.4–6.5)
NEUTROS PCT: 72 %
Platelets: 187 10*3/uL (ref 150–440)
RBC: 3.64 MIL/uL — ABNORMAL LOW (ref 4.40–5.90)
RDW: 15.3 % — AB (ref 11.5–14.5)
WBC: 5.7 10*3/uL (ref 3.8–10.6)

## 2016-12-12 LAB — BASIC METABOLIC PANEL
Anion gap: 13 (ref 5–15)
BUN: 20 mg/dL (ref 6–20)
CALCIUM: 8.8 mg/dL — AB (ref 8.9–10.3)
CHLORIDE: 103 mmol/L (ref 101–111)
CO2: 25 mmol/L (ref 22–32)
CREATININE: 1.07 mg/dL (ref 0.61–1.24)
GFR calc Af Amer: >60 mL/min (ref >60)
GFR calc non Af Amer: >60 mL/min (ref >60)
GLUCOSE: 183 mg/dL — AB (ref 65–99)
Potassium: 4.2 mmol/L (ref 3.5–5.1)
Sodium: 141 mmol/L (ref 135–145)

## 2016-12-12 LAB — PSA: Prostatic Specific Antigen: 1.14 ng/mL (ref 0.00–4.00)

## 2016-12-12 NOTE — Progress Notes (Signed)
Hematology/Oncology Consult note [facility]  Telephone:([phone] Fax:([phone]) [phone]  Patient Care Team: [name], MD as PCP - General (Family Medicine)   Name of the patient: Michael Mathews  [id number]  [birth year]   Date of visit: 12/12/16  Diagnosis- metastatic castrate sensitive prostate cancer   Chief complaint/ Reason for visit- discuss biopsy results  Heme/Onc history: 1. patient is 71 year old male with a past medical history significant for hypertension diabetes and COPD among other medical problems. He was admitted to the hospital in October 2017 with some symptoms of dizziness and abdominal pain. CT angiogram abdomen incidentally showed sclerotic metastatic disease throughout the visualized spine.   2. This was followed by an MRI of the lumbar spine which showed multifocal T1 and T2 hypointense lesions with areas of increased signal on STIR sequence are seen in multiple vertebral bodies. Largest lesions are in T11 T12 L1 and in the imaged sacrum. This is consistent with metastatic disease. Primary lesion is not identified. MRI brain showed no acute intracranial abnormality.   3. Patient was noted to have an elevated PSA and was referred to urology as an outpatient. He was recently seen by Dr. Tresa Moore on 05/29/2016. PSA was repeated at that time which came back elevated at 10.3. 3 months over the value was 10.81.  4. Bone scan on 07/07/16 showed : Widespread radiotracer uptake over the axial and appendicular skeleton as described compatible with metastatic disease and correlating with sclerotic lesions on Ct. CT chest on 07/07/16 showed: Scattered sclerotic osseous metastatic lesions throughout spine, pelvis, proximal femora, ribs, and manubrium. Prostatic enlargement with thickened bladder wall question related to chronic bladder outlet obstruction.  Spiculated 12 x 11 x 7 mm LEFT upper lobe nodule question primary pulmonary neoplasm. Stable  nonspecific small LEFT adrenal nodule. Single minimally enlarged AP window lymph node. BILATERAL inguinal hernias containing fat. 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 Recommend annual imaging followup by CTA or MRA.   5. Anemia work up from 06/20/16 was as follows: CBC showed white count of 5.2, H&H of 8.1/25 and a platelet count of 164. CMP showed elevated alkaline phosphatase of 391. Multiple myeloma panel showed normal quantitative immunoglobulins and no monoclonal M protein.Vitamin B12 level was low normal at 225. LDH was mildly elevated at 234. Ferritin was low normal at 27. Reticulocyte count was 2.4% inappropriately low for the degree of anemia. Haptoglobin was elevated at 273.  6. Patients case was discussed at tumor board and plan was to watch the lung nodule with scans as it was in a difficult area to biopsy. CT guided bone biopsy was obtained which showed metastatic prostate cancer for which he is on lupron  7. Given that he does not have visceral or lymph node metastasis, docetaxel/ zytiga was not started. Also patient lives alone and does not have a good social support or means of communication.  Those options to be considered at progression.    Interval history- he feels at his baseline. Has baseline fatigue and SOB which remains unchanged   ECOG PS- 1 Pain scale- 0  Review of systems- Review of Systems  Constitutional: Positive for malaise/fatigue. Negative for chills, fever and weight loss.  HENT: Negative for congestion, ear discharge and nosebleeds.   Eyes: Negative for blurred vision.  Respiratory: Positive for shortness of breath. Negative for cough, hemoptysis, sputum production and wheezing.   Cardiovascular: Negative for chest pain, palpitations,  orthopnea and claudication.  Gastrointestinal: Negative for abdominal pain, blood in stool, constipation, diarrhea,  heartburn, melena, nausea and vomiting.  Genitourinary: Negative for dysuria, flank pain, frequency, hematuria and urgency.  Musculoskeletal: Negative for back pain, joint pain and myalgias.  Skin: Negative for rash.  Neurological: Negative for dizziness, tingling, focal weakness, seizures, weakness and headaches.  Endo/Heme/Allergies: Does not bruise/bleed easily.  Psychiatric/Behavioral: Negative for depression and suicidal ideas. The patient does not have insomnia.      No Known Allergies   Past Medical History:  Diagnosis Date  . Arthritis   . Asthma   . Cancer [facility])    prostate  . COPD (chronic obstructive pulmonary disease) ([place])   . Coronary artery disease   . Cough    chronic  . Diabetes mellitus without complication ([place])   . Edema   . Elevated PSA   . Headache   . Hypertension   . Orthopnea   . Oxygen deficit    o2 prn  . Shortness of breath dyspnea   . Sleep apnea    cpap     Past Surgical History:  Procedure Laterality Date  . CARDIAC CATHETERIZATION  2014  . COLONOSCOPY    . CYSTOSCOPY W/ RETROGRADES Bilateral 10/18/2016   Procedure: CYSTOSCOPY WITH RETROGRADE PYELOGRAM;  Surgeon: Hollice Espy, MD;  Location: [facility] ORS;  Service: Urology;  Laterality: Bilateral;  . EYE SURGERY    . KNEE ARTHROSCOPY    . TRANSURETHRAL RESECTION OF BLADDER TUMOR N/A 10/18/2016   Procedure: TRANSURETHRAL RESECTION OF BLADDER TUMOR (TURBT);  Surgeon: Hollice Espy, MD;  Location: [facility] ORS;  Service: Urology;  Laterality: N/A;  . TRANSURETHRAL RESECTION OF PROSTATE N/A 10/18/2016   Procedure: TRANSURETHRAL RESECTION OF THE PROSTATE (TURP) CHANNEL TURP;  Surgeon: Hollice Espy, MD;  Location: [facility] ORS;  Service: Urology;  Laterality: N/A;    Social History   Social History  . Marital status: Single    Spouse name: N/A  . Number of children: N/A  . Years of education: N/A   Occupational History  . Not on file.   Social History Main Topics  . Smoking status: Current  Every Day Smoker    Packs/day: 0.50    Years: 55.00  . Smokeless tobacco: Never Used  . Alcohol use No  . Drug use: No  . Sexual activity: Not on file   Other Topics Concern  . Not on file   Social History Narrative  . No narrative on file    Family History  Problem Relation Age of Onset  . CVA Mother   . CAD Father      Current Outpatient Prescriptions:  .  aspirin EC 325 MG tablet, Take 325 mg by mouth every morning., Disp: , Rfl:  .  atorvastatin (LIPITOR) 40 MG tablet, Take 1 tablet (40 mg total) by mouth daily., Disp: 30 tablet, Rfl: 0 .  bicalutamide (CASODEX) 50 MG tablet, Take 1 tablet (50 mg total) by mouth daily., Disp: 14 tablet, Rfl: 0 .  cloNIDine (CATAPRES) 0.3 MG tablet, Take 0.3 mg by mouth daily. , Disp: , Rfl:  .  DULoxetine (CYMBALTA) 30 MG capsule, Take 30 mg by mouth daily., Disp: , Rfl:  .  ferrous sulfate 325 (65 FE) MG EC tablet, Take 1 tablet (325 mg total) by mouth 2 (two) times daily with a meal., Disp: 60 tablet, Rfl: 3 .  finasteride (PROSCAR) 5 MG tablet, Take 5 mg by mouth daily., Disp: , Rfl:  .  Fluticasone-Salmeterol (ADVAIR)  250-50 MCG/DOSE AEPB, Inhale 1 puff into the lungs 2 (two) times daily., Disp: , Rfl:  .  GLIPIZIDE XL 10 MG 24 hr tablet, Take 10 mg by mouth 2 (two) times daily. , Disp: , Rfl:  .  hydrALAZINE (APRESOLINE) 100 MG tablet, 3 (three) times daily. , Disp: , Rfl:  .  hydrochlorothiazide (HYDRODIURIL) 25 MG tablet, Take 25 mg by mouth daily., Disp: , Rfl:  .  losartan (COZAAR) 100 MG tablet, Take 100 mg by mouth daily., Disp: , Rfl:  .  metFORMIN (GLUCOPHAGE) 500 MG tablet, Take 500 mg by mouth 2 (two) times daily with a meal., Disp: , Rfl:  .  metoprolol (LOPRESSOR) 50 MG tablet, Take 50 mg by mouth 2 (two) times daily., Disp: , Rfl:  .  oxyCODONE (OXY IR/ROXICODONE) 5 MG immediate release tablet, Take 1 tablet (5 mg total) by mouth every 6 (six) hours as needed for moderate pain., Disp: 10 tablet, Rfl: 0 .  tamsulosin  (FLOMAX) 0.4 MG CAPS capsule, Take 1 capsule (0.4 mg total) by mouth daily., Disp: 90 capsule, Rfl: 3 .  vitamin B-12 (CYANOCOBALAMIN) 1000 MCG tablet, Take 1 tablet (1,000 mcg total) by mouth daily., Disp: 30 tablet, Rfl: 3  Physical exam:  Vitals:   12/12/16 1404  BP: 133/81  Pulse: 60  Resp: 20  Temp: (!) 97 F (36.1 C)  TempSrc: Tympanic  Weight: 222 lb (100.7 kg)   Physical Exam  Constitutional: He is oriented to person, place, and time and well-developed, well-nourished, and in no distress.  HENT:  Head: Normocephalic and atraumatic.  Eyes: EOM are normal. Pupils are equal, round, and reactive to light.  Neck: Normal range of motion.  Cardiovascular: Normal rate, regular rhythm and normal heart sounds.   Pulmonary/Chest: Effort normal and breath sounds normal.  Abdominal: Soft. Bowel sounds are normal.  Neurological: He is alert and oriented to person, place, and time.  Skin: Skin is warm and dry.     CMP Latest Ref Rng & Units 12/12/2016  Glucose 65 - 99 mg/dL 183(H)  BUN 6 - 20 mg/dL 20  Creatinine 0.61 - 1.24 mg/dL 1.07  Sodium 135 - 145 mmol/L 141  Potassium 3.5 - 5.1 mmol/L 4.2  Chloride 101 - 111 mmol/L 103  CO2 22 - 32 mmol/L 25  Calcium 8.9 - 10.3 mg/dL 8.8(L)  Total Protein 6.5 - 8.1 g/dL -  Total Bilirubin 0.3 - 1.2 mg/dL -  Alkaline Phos 38 - 126 U/L -  AST 15 - 41 U/L -  ALT 17 - 63 U/L -   CBC Latest Ref Rng & Units 12/12/2016  WBC 3.8 - 10.6 K/uL 5.7  Hemoglobin 13.0 - 18.0 g/dL 11.6(L)  Hematocrit 40.0 - 52.0 % 34.5(L)  Platelets 150 - 440 K/uL 187    No images are attached to the encounter.  Ct Chest W Contrast  Result Date: 11/27/2016 CLINICAL DATA:  Follow-up left upper lobe lung nodule EXAM: CT CHEST WITH CONTRAST TECHNIQUE: Multidetector CT imaging of the chest was performed during intravenous contrast administration. CONTRAST:  83m ISOVUE-300 IOPAMIDOL (ISOVUE-300) INJECTION 61% COMPARISON:  07/07/2016 FINDINGS: Cardiovascular: Aortic  calcifications are noted with evidence of dilatation of the ascending aorta 4.2 cm. This is relatively stable from the prior exam. Coronary calcifications are noted as well. No cardiac enlargement is seen. The pulmonary artery as visualized is within normal limits. Mediastinum/Nodes: The thoracic inlet is within normal limits. Stable AP window node measuring 12 mm this is stable from the  prior exam. Few calcified hilar lymph nodes are noted on the right consistent with prior granulomatous disease. The esophagus as visualized is within normal limits. Lungs/Pleura: Right lung is well aerated. A stable subpleural nodule is noted in the upper lobe best seen on image number are 43 of series 4. A few scattered calcified granulomas are identified. The left lung again demonstrates an upper lobe nodule along the major fissure. It now measures 10 x 8 mm which is a slight decrease in size when compared with the prior exam. Upper Abdomen: Visualized upper abdomen reveals no acute abnormality. Musculoskeletal: The osseous structures again demonstrate multifocal areas of increased sclerosis consistent with metastatic disease. This is most prevalent within the thoracic spine but also involves the ribcage sternum and bilateral humeri. Sclerotic focus within the manubrium is also seen. The disease within the spine has progressed when compared with the prior exam. IMPRESSION: Progression in osseous metastatic disease when compared with the prior exam. Left upper lobe nodule slightly smaller in size as described. Followup in 6 months is recommended to assess for stability. Changes of prior granulomatous disease. Stable thoracic aortic aneurysm. Recommend annual imaging followup by CTA or MRA. This recommendation follows 2010 ACCF/AHA/AATS/ACR/ASA/SCA/SCAI/SIR/STS/SVM Guidelines for the Diagnosis and Management of Patients with Thoracic Aortic Disease. Circulation. 2010; 121: J673-A193 Aortic Atherosclerosis (ICD10-I70.0) and Emphysema  (ICD10-J43.9). Aortic aneurysm NOS (ICD10-I71.9). Electronically Signed   By: Inez Catalina M.D.   On: 11/27/2016 14:44   Dg Bone Density  Result Date: 11/21/2016 EXAM: DUAL X-RAY ABSORPTIOMETRY (DXA) FOR BONE MINERAL DENSITY IMPRESSION: Dear Dr. Owens Shark, Your patient Stacie Acres completed a BMD test on 11/21/2016 using the [facility] (analysis version: 14.10) manufactured by [employer]. The following summarizes the results of our evaluation. PATIENT BIOGRAPHICAL: Name: Sherron, Mummert Patient ID: [id number] Birth Date: 31-Mar-1946 Height: 68.0 in. Gender: Male Exam Date: 11/21/2016 Weight: 225.0 lbs. Indications: Caucasian, Height Loss, Low Calcium Intake, Prostate Cancer, Tobacco User Fractures: Treatments: lupron injections ASSESSMENT: The BMD measured at Femur Neck Right is 0.989 g/cm2 with a T-score of -0.6 is normal. This patient is considered normal to [facility]) criteria. Site Region Measured Measured WHO Young Adult BMD Date       Age      Classification T-score DualFemur Neck Right 11/21/2016 71.9 Normal -0.6 0.989 g/cm2 Left Forearm Radius 33% 11/21/2016 71.9 Normal 1.2 1.107 g/cm2 World Health Organization [facility]) criteria for post-menopausal, Caucasian Women: Normal:       T-score at or above -1 SD Osteopenia:   T-score between -1 and -2.5 SD Osteoporosis: T-score at or below -2.5 SD RECOMMENDATIONS: [place] recommends that FDA-approved medical therapies be considered in postmenopausal women and men age 53 or older with a: 1. Hip or vertebral (clinical or morphometric) fracture. 2. T-score of < -2.5 at the spine or hip. 3. Ten-year fracture probability by FRAX of 3% or greater for hip fracture or 20% or greater for major osteoporotic fracture. All treatment decisions require clinical judgment and consideration of individual patient factors, including patient preferences, co-morbidities, previous drug use, risk factors not captured in the FRAX  model (e.g. falls, vitamin D deficiency, increased bone turnover, interval significant decline in bone density) and possible under - or over-estimation of fracture risk by FRAX. All patients should ensure an adequate intake of dietary calcium (1200 mg/d) and vitamin D (800 IU daily) unless contraindicated. FOLLOW-UP: People with diagnosed cases of osteoporosis or osteopenia should be regularly tested for bone mineral density. For  patients eligible for Medicare, routine testing is allowed once every 2 years. The testing frequency can be increased to one year for patients who have rapidly progressing disease, or for those who are receiving medical therapy to restore bone mass. I have reviewed this report, and agree with the above findings. [facility] Electronically Signed   By: Lajean Manes M.D.   On: 11/21/2016 11:48     Assessment and plan- Patient is a 71 y.o. male who sees me for following issues:  1. Castrate sensitive prostate cancer with bone mets- CT thorax showed worsening bone mets. PSA however is improving. PSA from today is pending. Will obtain repeat bone scan at this time. Last lupron shot was on 11/21/16. Next dose in 3 months. I will review his bone scan at tumor board. He did not receive docetaxel or zytiga for above reasons mentioned in history. Bone density showed no osteopenia or osteoporosis. Hold off on bisphosphonates  2. Lung nodule- appears smaller on CT done in 11/27/16. Will repeat scan in 6 months.   3. Thoracic aortic aneurysm- montior with CT in 6 months  4. Normocytic anemia- improved after iron and B12 supplementation. Continue to monitor   Visit Diagnosis 1. Prostate cancer metastatic to bone ([place])   2. Normocytic anemia   3. Lung nodule      Dr. Randa Evens, MD, MPH [facility] Pager- [phone] 12/12/2016 4:19 PM

## 2016-12-12 NOTE — Progress Notes (Signed)
Patient offers no complaints today. 

## 2016-12-13 LAB — TESTOSTERONE: Testosterone: <3 ng/dL — ABNORMAL LOW (ref 264–916)

## 2016-12-22 ENCOUNTER — Encounter
Admission: RE | Admit: 2016-12-22 | Discharge: 2016-12-22 | Disposition: A | Discharge status: Home / Self Care | Payer: Medicare Other | Source: Ambulatory Visit | Attending: Oncology | Admitting: Oncology

## 2016-12-22 DIAGNOSIS — C7951 Secondary malignant neoplasm of bone: Secondary | ICD-10-CM | POA: Insufficient documentation

## 2016-12-22 DIAGNOSIS — C61 Malignant neoplasm of prostate: Secondary | ICD-10-CM | POA: Diagnosis present

## 2016-12-22 MED ORDER — TECHNETIUM TC 99M MEDRONATE IV KIT
25.0000 | PACK | Freq: Once | INTRAVENOUS | Status: AC | PRN
  Administered 2016-12-22: 22.78 via INTRAVENOUS

## 2016-12-25 ENCOUNTER — Other Ambulatory Visit: Payer: Self-pay | Admitting: Oncology

## 2016-12-29 ENCOUNTER — Other Ambulatory Visit: Payer: Self-pay | Admitting: Oncology

## 2016-12-29 DIAGNOSIS — C61 Malignant neoplasm of prostate: Secondary | ICD-10-CM

## 2016-12-29 DIAGNOSIS — C7951 Secondary malignant neoplasm of bone: Principal | ICD-10-CM

## 2016-12-29 MED ORDER — PREDNISONE 5 MG PO TABS: 5.0000 mg | ORAL_TABLET | Freq: Two times a day (BID) | ORAL | 0 refills | Status: DC

## 2016-12-29 MED ORDER — ABIRATERONE ACETATE 250 MG PO TABS: 1000.0000 mg | ORAL_TABLET | Freq: Every day | ORAL | 0 refills | Status: DC

## 2016-12-29 NOTE — Progress Notes (Signed)
Bone scan reviewed at tumor board yesterday. He has evidence of worsening bone mets. He is still castrate sensitive but given high risk disease and worsening bone mets, I would like to start him on zytiga 1gm daily with prednisone 5 mg BID. We are trying to get in touch with the patient to see me asap and discuss this. We will get working on his insurance authorization for zytiga in the meanwhile  Dr. Randa Evens, MD, MPH [facility] Pager- [phone] 12/29/2016 11:27 AM

## 2017-01-04 ENCOUNTER — Inpatient Hospital Stay: Payer: Medicare Other | Admitting: Oncology

## 2017-01-04 ENCOUNTER — Encounter: Payer: Self-pay | Admitting: Emergency Medicine

## 2017-01-04 ENCOUNTER — Emergency Department: Payer: Medicare Other

## 2017-01-04 DIAGNOSIS — J45909 Unspecified asthma, uncomplicated: Secondary | ICD-10-CM | POA: Diagnosis not present

## 2017-01-04 DIAGNOSIS — J449 Chronic obstructive pulmonary disease, unspecified: Secondary | ICD-10-CM | POA: Diagnosis not present

## 2017-01-04 DIAGNOSIS — E119 Type 2 diabetes mellitus without complications: Secondary | ICD-10-CM | POA: Insufficient documentation

## 2017-01-04 DIAGNOSIS — C61 Malignant neoplasm of prostate: Secondary | ICD-10-CM | POA: Insufficient documentation

## 2017-01-04 DIAGNOSIS — Z7984 Long term (current) use of oral hypoglycemic drugs: Secondary | ICD-10-CM | POA: Insufficient documentation

## 2017-01-04 DIAGNOSIS — I10 Essential (primary) hypertension: Secondary | ICD-10-CM | POA: Diagnosis not present

## 2017-01-04 DIAGNOSIS — Z79899 Other long term (current) drug therapy: Secondary | ICD-10-CM | POA: Diagnosis not present

## 2017-01-04 DIAGNOSIS — R079 Chest pain, unspecified: Secondary | ICD-10-CM | POA: Diagnosis present

## 2017-01-04 DIAGNOSIS — J181 Lobar pneumonia, unspecified organism: Secondary | ICD-10-CM | POA: Insufficient documentation

## 2017-01-04 DIAGNOSIS — I771 Stricture of artery: Secondary | ICD-10-CM | POA: Insufficient documentation

## 2017-01-04 DIAGNOSIS — Z7982 Long term (current) use of aspirin: Secondary | ICD-10-CM | POA: Insufficient documentation

## 2017-01-04 DIAGNOSIS — I2510 Atherosclerotic heart disease of native coronary artery without angina pectoris: Secondary | ICD-10-CM | POA: Insufficient documentation

## 2017-01-04 DIAGNOSIS — F17200 Nicotine dependence, unspecified, uncomplicated: Secondary | ICD-10-CM | POA: Diagnosis not present

## 2017-01-04 LAB — BASIC METABOLIC PANEL
ANION GAP: 10 (ref 5–15)
BUN: 23 mg/dL — ABNORMAL HIGH (ref 6–20)
CALCIUM: 8.8 mg/dL — AB (ref 8.9–10.3)
CO2: 29 mmol/L (ref 22–32)
Chloride: 102 mmol/L (ref 101–111)
Creatinine, Ser: 0.84 mg/dL (ref 0.61–1.24)
Glucose, Bld: 100 mg/dL — ABNORMAL HIGH (ref 65–99)
POTASSIUM: 4 mmol/L (ref 3.5–5.1)
Sodium: 141 mmol/L (ref 135–145)

## 2017-01-04 LAB — CBC
HEMATOCRIT: 32.3 % — AB (ref 40.0–52.0)
HEMOGLOBIN: 10.9 g/dL — AB (ref 13.0–18.0)
MCH: 31.7 pg (ref 26.0–34.0)
MCHC: 33.8 g/dL (ref 32.0–36.0)
MCV: 93.8 fL (ref 80.0–100.0)
Platelets: 195 10*3/uL (ref 150–440)
RBC: 3.44 MIL/uL — AB (ref 4.40–5.90)
RDW: 15.2 % — ABNORMAL HIGH (ref 11.5–14.5)
WBC: 7.8 10*3/uL (ref 3.8–10.6)

## 2017-01-04 LAB — TROPONIN I

## 2017-01-04 LAB — PROTIME-INR
INR: 0.94
PROTHROMBIN TIME: 12.6 s (ref 11.4–15.2)

## 2017-01-04 NOTE — ED Triage Notes (Signed)
Pt to ED from home c/o chest pain that started today with pressure across middle chest radiating to back, denies n/v/d.  Pt used vick's vaporub across chest without relief.  Skin otherwise warm and dry, chest rise even and unlabored.

## 2017-01-04 NOTE — ED Notes (Signed)
Patient transported to X-ray 

## 2017-01-05 ENCOUNTER — Encounter: Payer: Self-pay | Admitting: Radiology

## 2017-01-05 ENCOUNTER — Emergency Department: Payer: Medicare Other

## 2017-01-05 ENCOUNTER — Telehealth: Payer: Self-pay | Admitting: Oncology

## 2017-01-05 ENCOUNTER — Emergency Department
Admission: EM | Admit: 2017-01-05 | Discharge: 2017-01-05 | Disposition: A | Discharge status: Home / Self Care | Payer: Medicare Other | Attending: Emergency Medicine | Admitting: Emergency Medicine

## 2017-01-05 DIAGNOSIS — K551 Chronic vascular disorders of intestine: Secondary | ICD-10-CM

## 2017-01-05 DIAGNOSIS — I771 Stricture of artery: Secondary | ICD-10-CM | POA: Diagnosis not present

## 2017-01-05 DIAGNOSIS — J189 Pneumonia, unspecified organism: Secondary | ICD-10-CM

## 2017-01-05 DIAGNOSIS — C799 Secondary malignant neoplasm of unspecified site: Secondary | ICD-10-CM

## 2017-01-05 DIAGNOSIS — J181 Lobar pneumonia, unspecified organism: Secondary | ICD-10-CM

## 2017-01-05 MED ORDER — AZITHROMYCIN 250 MG PO TABS: ORAL_TABLET | ORAL | 0 refills | Status: AC

## 2017-01-05 MED ORDER — OXYCODONE-ACETAMINOPHEN 5-325 MG PO TABS: 1.0000 | ORAL_TABLET | ORAL | 0 refills | Status: DC | PRN

## 2017-01-05 MED ORDER — MORPHINE SULFATE (PF) 2 MG/ML IV SOLN
INTRAVENOUS | Status: AC
  Administered 2017-01-05: 2 mg via INTRAVENOUS
  Filled 2017-01-05: qty 1

## 2017-01-05 MED ORDER — IOPAMIDOL (ISOVUE-370) INJECTION 76%
125.0000 mL | Freq: Once | INTRAVENOUS | Status: AC | PRN
  Administered 2017-01-05: 125 mL via INTRAVENOUS

## 2017-01-05 MED ORDER — LABETALOL HCL 5 MG/ML IV SOLN
10.0000 mg | Freq: Once | INTRAVENOUS | Status: AC
  Administered 2017-01-05: 10 mg via INTRAVENOUS
  Filled 2017-01-05: qty 4

## 2017-01-05 MED ORDER — ONDANSETRON HCL 4 MG/2ML IJ SOLN
INTRAMUSCULAR | Status: AC
Start: 2017-01-05 — End: 2017-01-05
  Administered 2017-01-05: 4 mg
  Filled 2017-01-05: qty 2

## 2017-01-05 NOTE — ED Notes (Signed)
Morphine 2mg  and Zofran 4mg  verbal orders per Dr. Owens Shark

## 2017-01-05 NOTE — ED Provider Notes (Signed)
[facility] Emergency Department Provider Note    First MD Initiated Contact with Patient 01/05/17 0205     (approximate)  I have reviewed the triage vital signs and the nursing notes.   HISTORY  Chief Complaint Chest Pain    HPI Michael Mathews is a 71 y.o. male with below list of chronic medical conditions including metastatic prostate cancer presents to the emergency department with lower chest and abdominal pain radiating to his back with onset yesterday. Patient states his current pain score is 8 out of 10. Patient denies any nausea no vomiting diarrhea. Patient denies any fever. Patient denies any urinary symptoms. Patient denies any gait instability or lower extremity weakness or numbness.   Past Medical History:  Diagnosis Date  . Arthritis   . Asthma   . Cancer [facility])    prostate  . COPD (chronic obstructive pulmonary disease) ([place])   . Coronary artery disease   . Cough    chronic  . Diabetes mellitus without complication ([place])   . Edema   . Elevated PSA   . Headache   . Hypertension   . Orthopnea   . Oxygen deficit    o2 prn  . Shortness of breath dyspnea   . Sleep apnea    cpap    Patient Active Problem List   Diagnosis Date Noted  . Prostate cancer metastatic to bone ([place]) 07/28/2016  . Goals of care, counseling/discussion 07/28/2016  . Elevated PSA 05/01/2016  . Urinary retention 05/01/2016  . Pneumonia 03/23/2016  . ARF (acute renal failure) ([place]) 03/11/2016  . Dizziness 03/03/2016  . Mixed hyperlipidemia 09/01/2015  . OSA (obstructive sleep apnea) 09/01/2015  . Atherosclerotic peripheral vascular disease with intermittent claudication ([place]) 04/16/2015  . Lumbar radiculitis 08/13/2014  . Lumbar stenosis with neurogenic claudication 08/13/2014  . Cardiomyopathy ([place]) 01/23/2013  . DOE (dyspnea on exertion) 01/23/2013  . Hyperlipidemia 01/23/2013  . PVC (premature ventricular contraction) 01/23/2013  . Nonspecific  elevation of levels of transaminase and lactic acid dehydrogenase (ldh) 07/21/2011  . Type 2 diabetes mellitus without complications ([place]) 16/03/9603  . Sciatica 06/28/2010  . Chronic obstructive pulmonary disease ([place]) 12/02/2008  . Essential (primary) hypertension 09/05/2007  . Personal history of transient ischemic attack (TIA), and cerebral infarction without residual deficits 09/05/2007    Past Surgical History:  Procedure Laterality Date  . CARDIAC CATHETERIZATION  2014  . COLONOSCOPY    . CYSTOSCOPY W/ RETROGRADES Bilateral 10/18/2016   Procedure: CYSTOSCOPY WITH RETROGRADE PYELOGRAM;  Surgeon: Hollice Espy, MD;  Location: [facility] ORS;  Service: Urology;  Laterality: Bilateral;  . EYE SURGERY    . KNEE ARTHROSCOPY    . TRANSURETHRAL RESECTION OF BLADDER TUMOR N/A 10/18/2016   Procedure: TRANSURETHRAL RESECTION OF BLADDER TUMOR (TURBT);  Surgeon: Hollice Espy, MD;  Location: [facility] ORS;  Service: Urology;  Laterality: N/A;  . TRANSURETHRAL RESECTION OF PROSTATE N/A 10/18/2016   Procedure: TRANSURETHRAL RESECTION OF THE PROSTATE (TURP) CHANNEL TURP;  Surgeon: Hollice Espy, MD;  Location: [facility] ORS;  Service: Urology;  Laterality: N/A;    Prior to Admission medications   Medication Sig Start Date End Date Taking? Authorizing Provider  abiraterone Acetate (ZYTIGA) 250 MG tablet Take 4 tablets (1,000 mg total) by mouth daily. Take on an empty stomach 1 hour before or 2 hours after a meal 12/29/16   Sindy Guadeloupe, MD  aspirin EC 325 MG tablet Take 325 mg by mouth every morning.    [provider]  atorvastatin (  LIPITOR) 40 MG tablet Take 1 tablet (40 mg total) by mouth daily. 03/24/16   Bettey Costa, MD  bicalutamide (CASODEX) 50 MG tablet Take 1 tablet (50 mg total) by mouth daily. 07/28/16   Sindy Guadeloupe, MD  cloNIDine (CATAPRES) 0.3 MG tablet Take 0.3 mg by mouth daily.     [provider]  DULoxetine (CYMBALTA) 30 MG capsule Take 30 mg by mouth daily. 01/04/16    [provider]  ferrous sulfate 325 (65 FE) MG EC tablet Take 1 tablet (325 mg total) by mouth 2 (two) times daily with a meal. 07/28/16   Sindy Guadeloupe, MD  finasteride (PROSCAR) 5 MG tablet Take 5 mg by mouth daily.    [provider]  Fluticasone-Salmeterol (ADVAIR) 250-50 MCG/DOSE AEPB Inhale 1 puff into the lungs 2 (two) times daily.    [provider]  GLIPIZIDE XL 10 MG 24 hr tablet Take 10 mg by mouth 2 (two) times daily.  01/19/16   [provider]  hydrALAZINE (APRESOLINE) 100 MG tablet 3 (three) times daily.  05/31/16   [provider]  hydrochlorothiazide (HYDRODIURIL) 25 MG tablet Take 25 mg by mouth daily.    [provider]  losartan (COZAAR) 100 MG tablet Take 100 mg by mouth daily.    [provider]  metFORMIN (GLUCOPHAGE) 500 MG tablet Take 500 mg by mouth 2 (two) times daily with a meal.    [provider]  metoprolol (LOPRESSOR) 50 MG tablet Take 50 mg by mouth 2 (two) times daily.    [provider]  oxyCODONE (OXY IR/ROXICODONE) 5 MG immediate release tablet Take 1 tablet (5 mg total) by mouth every 6 (six) hours as needed for moderate pain. 10/19/16   McGowan, Larene Beach A, PA-C  predniSONE (DELTASONE) 5 MG tablet Take 1 tablet (5 mg total) by mouth 2 (two) times daily with a meal. 12/29/16   Sindy Guadeloupe, MD  tamsulosin (FLOMAX) 0.4 MG CAPS capsule Take 1 capsule (0.4 mg total) by mouth daily. 05/29/16   [name], MD  vitamin B-12 (CYANOCOBALAMIN) 1000 MCG tablet Take 1 tablet (1,000 mcg total) by mouth daily. 07/28/16   Sindy Guadeloupe, MD    Allergies Patient has no known allergies.  Family History  Problem Relation Age of Onset  . CVA Mother   . CAD Father     Social History Social History  Substance Use Topics  . Smoking status: Current Every Day Smoker    Packs/day: 0.50    Years: 55.00  . Smokeless tobacco: Never Used  . Alcohol use No    Review of  Systems Constitutional: No fever/chills Eyes: No visual changes. ENT: No sore throat. Cardiovascular: Positive chest pain. Respiratory: Positive shortness of breath. Gastrointestinal: No abdominal pain.  No nausea, no vomiting.  No diarrhea.  No constipation. Genitourinary: Negative for dysuria. Musculoskeletal: Negative for neck pain.  Negative for back pain. Integumentary: Negative for rash. Neurological: Negative for headaches, focal weakness or numbness.   ____________________________________________   PHYSICAL EXAM:  VITAL SIGNS: ED Triage Vitals  Enc Vitals Group     BP 01/04/17 2230 (!) 190/84     Pulse Rate 01/04/17 2230 68     Resp 01/04/17 2230 16     Temp 01/04/17 2230 (!) 97.5 F (36.4 C)     Temp Source 01/04/17 2230 Oral     SpO2 01/04/17 2230 97 %     Weight 01/04/17 2231 99.8 kg (220 lb)  Height 01/04/17 2231 1.778 m (5\' 10" )     Head Circumference --      Peak Flow --      Pain Score 01/04/17 2230 6     Pain Loc --      Pain Edu? --      Excl. in [place]? --     Constitutional: Alert and oriented. Well appearing and in no acute distress. Eyes: Conjunctivae are normal. Head: Atraumatic. Mouth/Throat: Mucous membranes are moist.  Oropharynx non-erythematous. Neck: No stridor.   Cardiovascular: Normal rate, regular rhythm. Good peripheral circulation. Grossly normal heart sounds. Respiratory: Normal respiratory effort.  No retractions. Lungs CTAB. Gastrointestinal: Soft and nontender. No distention.  Musculoskeletal: No lower extremity tenderness nor edema. No gross deformities of extremities. Neurologic:  Normal speech and language. No gross focal neurologic deficits are appreciated.  Skin:  Skin is warm, dry and intact. No rash noted. Psychiatric: Mood and affect are normal. Speech and behavior are normal.  ____________________________________________   LABS (all labs ordered are listed, but only abnormal results are displayed)  Labs Reviewed   BASIC METABOLIC PANEL - Abnormal; Notable for the following:       Result Value   Glucose, Bld 100 (*)    BUN 23 (*)    Calcium 8.8 (*)    All other components within normal limits  CBC - Abnormal; Notable for the following:    RBC 3.44 (*)    Hemoglobin 10.9 (*)    HCT 32.3 (*)    RDW 15.2 (*)    All other components within normal limits  TROPONIN I  PROTIME-INR   ____________________________________________  EKG  ED ECG REPORT I, [place] N Manav Pierotti, the attending physician, personally viewed and interpreted this ECG.   Date: 01/05/2017  EKG Time: 10:28 PM  Rate: 78  Rhythm: Normal sinus rhythm  Axis: Normal  Intervals: Normal  ST&T Change: None  ____________________________________________  RADIOLOGY I, [place] Shailynn Fong, personally viewed and evaluated these images (plain radiographs) as part of my medical decision making, as well as reviewing the written report by the radiologist.  Dg Chest 2 View  Result Date: 01/04/2017 CLINICAL DATA:  Right-sided upper chest pain for several hours EXAM: CHEST  2 VIEW COMPARISON:  11/27/2016 FINDINGS: Cardiac shadow is within normal limits. Tortuosity of thoracic aorta is again seen. The lungs are well aerated bilaterally without focal infiltrate or sizable effusion. Stable osteoblastic lesions are seen particularly within the spine and manubrium. Multiple old rib deformities are noted bilaterally. No acute infiltrate or sizable effusion is seen. The known left upper lobe nodule is not well appreciated on this exam. IMPRESSION: Stable osseous metastatic disease. No acute abnormality noted. Electronically Signed   By: Inez Catalina M.D.   On: 01/04/2017 22:50   Ct Angio Chest/abd/pel For Dissection W And/or Wo Contrast  Result Date: 01/05/2017 CLINICAL DATA:  Chest pain starting today with pressure across the mid chest radiating to the back. Hypertension. EXAM: CT ANGIOGRAPHY CHEST, ABDOMEN AND PELVIS TECHNIQUE: Multidetector CT imaging  through the chest, abdomen and pelvis was performed using the standard protocol during bolus administration of intravenous contrast. Multiplanar reconstructed images and MIPs were obtained and reviewed to evaluate the vascular anatomy. CONTRAST:  125 mL Isovue 370 COMPARISON:  CT chest 11/27/2016. CT chest abdomen and pelvis 07/07/2016 FINDINGS: CTA CHEST FINDINGS Cardiovascular: Noncontrast images of the chest demonstrate no evidence of intramural hematoma. Scattered aortic calcifications. Calcification in the coronary arteries. Calcified lymph nodes in the right hilum with  calcified granulomas in the right lung. Preferential opacification of the thoracic aorta. No evidence of thoracic aortic aneurysm or dissection. Normal heart size. No pericardial effusion. Pulmonary arteries are well opacified without evidence of significant pulmonary embolus. Mediastinum/Nodes: No enlarged mediastinal, hilar, or axillary lymph nodes. Thyroid gland, trachea, and esophagus demonstrate no significant findings. Lungs/Pleura: Focal infiltration in the right lung base could indicate pneumonia. This is new since previous chest. Persistent nodule in the left upper lung measuring 10 mm diameter. No significant change since previous study. No pleural effusions. No pneumothorax. Airways are patent. Musculoskeletal: Diffuse heterogeneous bone sclerosis consistent with diffuse bone metastasis from patient's known prostate carcinoma. Review of the MIP images confirms the above findings. CTA ABDOMEN AND PELVIS FINDINGS VASCULAR Aorta: Diffuse aortic calcification with eccentric noncalcified thrombus in the distal aorta. No aneurysm. No dissection. Celiac: Patent without evidence of aneurysm, dissection, vasculitis or significant stenosis. SMA: Superior mesenteric artery is patent with prominent calcification. In the mid/distal SMA, there is circumferential calcification likely resulting in moderate stenosis. Flow is demonstrated distally.  Renals: Both renal arteries are patent without evidence of aneurysm, dissection, vasculitis, fibromuscular dysplasia or significant stenosis. IMA: Patent without evidence of aneurysm, dissection, vasculitis or significant stenosis. 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 Veins: No obvious venous abnormality within the limitations of this arterial phase study. Review of the MIP images confirms the above findings. NON-VASCULAR Hepatobiliary: No focal liver abnormality is seen. No gallstones, gallbladder wall thickening, or biliary dilatation. Pancreas: Unremarkable. No pancreatic ductal dilatation or surrounding inflammatory changes. Spleen: Calcified granulomas in the spleen. Focal low-attenuation lesion measuring about a cm diameter likely representing a small hemangioma. No change. Adrenals/Urinary Tract: No adrenal gland nodules. Bilateral renal cortical atrophy or scarring. Nephrograms are symmetrical. No hydronephrosis or hydroureter. Bladder wall is not thickened. No filling defects. Stomach/Bowel: Stomach is within normal limits. Appendix appears normal. No evidence of bowel wall thickening, distention, or inflammatory changes. Lymphatic: No significant lymphadenopathy. Reproductive: Prostate gland is atrophic. Other: Fat containing bilateral inguinal hernias. Small fat containing periumbilical hernia. No free air or free fluid in the abdomen. Musculoskeletal: Diffuse heterogeneous bone sclerosis consistent with diffuse metastatic disease. Review of the MIP images confirms the above findings. IMPRESSION: 1. No evidence of aneurysm or dissection of the thoracic or abdominal aorta. 2. Diffuse calcific atherosclerotic changes. 3. 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 4. Diffuse bone metastasis. 5. Indeterminate 1 cm nodule in the left upper lung. Electronically Signed   By: Lucienne Capers M.D.   On: 01/05/2017 03:45       Procedures   ____________________________________________   INITIAL IMPRESSION / ASSESSMENT AND PLAN / ED COURSE  Pertinent labs & imaging results that were available during my care of the patient were reviewed by me and considered in my medical decision making (see chart for details).  71 year old male presenting with chest and abdomen and lower back pain. Patient noted to be hypertensive on arrival and a such concern for possible aortic injury CT scan chest abdomen and pelvis performed which above-mentioned findings. Patient given labetalol 10 mg IV with improvement of blood pressure current blood pressure 170 08/10/2000. Regarding possibility of right lower lobe pneumonia patient given azithromycin. Patient prescribed Percocet as back pain is most likely secondary to metastatic lesions in the bone. Regarding the patient's SMA occlusion patient discussed with Dr. 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[place])  Community acquired pneumonia of right lower lobe of lung ([place])  Metastatic cancer ([place])     MEDICATIONS GIVEN DURING THIS VISIT:  Medications  morphine 2 MG/ML injection (2 mg Intravenous Given 01/05/17 0254)  ondansetron (ZOFRAN) 4 MG/2ML injection (4 mg  Given 01/05/17 0256)  iopamidol (ISOVUE-370) 76 % injection 125 mL (125 mLs Intravenous Contrast Given 01/05/17 0319)  labetalol (NORMODYNE,TRANDATE) injection 10 mg (10 mg Intravenous Given 01/05/17 0341)     NEW OUTPATIENT MEDICATIONS STARTED DURING THIS VISIT:  New Prescriptions   No medications on file    Modified Medications   No medications on file    Discontinued Medications   No medications on file     Note:  This document was prepared using Dragon voice recognition software and may include unintentional dictation errors.    Gregor Hams, MD 01/05/17 [phone]

## 2017-01-05 NOTE — ED Notes (Signed)
Pt states he has generalized pain in his chest. Started yesterday and got worse last night.

## 2017-01-05 NOTE — Telephone Encounter (Signed)
Rescheduled from NO [facility]  01/04/17 appointment. appt conf with Jodi Marble for 01/12/17.

## 2017-01-12 ENCOUNTER — Inpatient Hospital Stay: Payer: Medicare Other | Attending: Oncology | Admitting: Oncology

## 2017-01-12 ENCOUNTER — Encounter: Payer: Self-pay | Admitting: Nurse Practitioner

## 2017-01-12 VITALS — BP 150/66 | HR 52 | Temp 94.9°F | Resp 18 | Wt 224.1 lb

## 2017-01-12 DIAGNOSIS — F17210 Nicotine dependence, cigarettes, uncomplicated: Secondary | ICD-10-CM | POA: Diagnosis not present

## 2017-01-12 DIAGNOSIS — E119 Type 2 diabetes mellitus without complications: Secondary | ICD-10-CM | POA: Diagnosis not present

## 2017-01-12 DIAGNOSIS — I2510 Atherosclerotic heart disease of native coronary artery without angina pectoris: Secondary | ICD-10-CM | POA: Diagnosis not present

## 2017-01-12 DIAGNOSIS — R911 Solitary pulmonary nodule: Secondary | ICD-10-CM | POA: Diagnosis not present

## 2017-01-12 DIAGNOSIS — I723 Aneurysm of iliac artery: Secondary | ICD-10-CM | POA: Insufficient documentation

## 2017-01-12 DIAGNOSIS — I700 Atherosclerosis of aorta: Secondary | ICD-10-CM | POA: Insufficient documentation

## 2017-01-12 DIAGNOSIS — Z7984 Long term (current) use of oral hypoglycemic drugs: Secondary | ICD-10-CM | POA: Diagnosis not present

## 2017-01-12 DIAGNOSIS — I712 Thoracic aortic aneurysm, without rupture: Secondary | ICD-10-CM | POA: Insufficient documentation

## 2017-01-12 DIAGNOSIS — K4020 Bilateral inguinal hernia, without obstruction or gangrene, not specified as recurrent: Secondary | ICD-10-CM | POA: Insufficient documentation

## 2017-01-12 DIAGNOSIS — I10 Essential (primary) hypertension: Secondary | ICD-10-CM | POA: Insufficient documentation

## 2017-01-12 DIAGNOSIS — D638 Anemia in other chronic diseases classified elsewhere: Secondary | ICD-10-CM

## 2017-01-12 DIAGNOSIS — Z7982 Long term (current) use of aspirin: Secondary | ICD-10-CM | POA: Insufficient documentation

## 2017-01-12 DIAGNOSIS — Z79899 Other long term (current) drug therapy: Secondary | ICD-10-CM | POA: Diagnosis not present

## 2017-01-12 DIAGNOSIS — K429 Umbilical hernia without obstruction or gangrene: Secondary | ICD-10-CM | POA: Diagnosis not present

## 2017-01-12 DIAGNOSIS — C61 Malignant neoplasm of prostate: Secondary | ICD-10-CM | POA: Diagnosis present

## 2017-01-12 DIAGNOSIS — G4730 Sleep apnea, unspecified: Secondary | ICD-10-CM | POA: Diagnosis not present

## 2017-01-12 DIAGNOSIS — C7951 Secondary malignant neoplasm of bone: Secondary | ICD-10-CM | POA: Diagnosis not present

## 2017-01-12 DIAGNOSIS — J449 Chronic obstructive pulmonary disease, unspecified: Secondary | ICD-10-CM | POA: Insufficient documentation

## 2017-01-12 MED ORDER — PREDNISONE 5 MG PO TABS: 5.0000 mg | ORAL_TABLET | Freq: Two times a day (BID) | ORAL | 0 refills | Status: DC

## 2017-01-12 NOTE — Progress Notes (Signed)
Patient offers no complaints today. 

## 2017-01-12 NOTE — Progress Notes (Signed)
Hematology/Oncology Consult note [facility]  Telephone:([phone] [phone]  Patient Care Team: [name], MD as PCP - General (Family Medicine)   Name of the patient: Michael Mathews  [id number]  1946/06/09   Date of visit: 01/12/17  Diagnosis- metastatic castrate sensitive prostate cancer   Chief complaint/ Reason for visit- discuss biopsy results  Heme/Onc history:1. patient is 71 year old male with a past medical history significant for hypertension diabetes and COPD among other medical problems. He was admitted to the hospital in October 2017 with some symptoms of dizziness and abdominal pain. CT angiogram abdomen incidentally showed sclerotic metastatic disease throughout the visualized spine.   2. This was followed by an MRI of the lumbar spine which showed multifocal T1 and T2 hypointense lesions with areas of increased signal on STIR sequence are seen in multiple vertebral bodies. Largest lesions are in T11 T12 L1 and in the imaged sacrum. This is consistent with metastatic disease. Primary lesion is not identified. MRI brain showed no acute intracranial abnormality.   3. Patient was noted to have an elevated PSA and was referred to urology as an outpatient. He was recently seen by Dr. Tresa Moore on 05/29/2016. PSA was repeated at that time which came back elevated at 10.3. 3 months over the value was 10.81.  4. Bone scan on 07/07/16 showed : Widespread radiotracer uptake over the axial and appendicular skeleton as described compatible with metastatic disease and correlating with sclerotic lesions on Ct. CT chest on 07/07/16 showed: Scattered sclerotic osseous metastatic lesions throughout spine, pelvis, proximal femora, ribs, and manubrium. Prostatic enlargement with thickened bladder wall question related to chronic bladder outlet obstruction.  Spiculated 12 x 11 x 7 mm LEFT upper lobe nodule question primary pulmonary neoplasm. Stable  nonspecific small LEFT adrenal nodule. Single minimally enlarged AP window lymph node. BILATERAL inguinal hernias containing fat. 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 Recommend annual imaging followup by CTA or MRA.   5. Anemia work up from 06/20/16 was as follows: CBC showed white count of 5.2, H&H of 8.1/25 and a platelet count of 164. CMP showed elevated alkaline phosphatase of 391. Multiple myeloma panel showed normal quantitative immunoglobulins and no monoclonal M protein.Vitamin B12 level was low normal at 225. LDH was mildly elevated at 234. Ferritin was low normal at 27. Reticulocyte count was 2.4% inappropriately low for the degree of anemia. Haptoglobin was elevated at 273.  6. Patients case was discussed at tumor board and plan was to watch the lung nodule with scans as it was in a difficult area to biopsy. CT guided bone biopsy was obtained which showed metastatic prostate cancer for which he is on lupron  7. Given that he does not have visceral or lymph node metastasis, docetaxel/ zytigawas not started. Also patient lives alone and does not have a good social support or means of communication. Those options to be considered at progression.   8. Bone scan on 12/22/2016 showed significantly worsening osseous metastatic disease. Left upper lobe nodule was slightly smaller in size.  Interval history- continues to smoke. Feels fatigued. Denies other complaints. He was recently in the hospital for chest pain and underwent CT PE which did not reveal any evidence of urinary embolism or dissection.  ECOG PS- 2 Pain scale- 0   Review of systems- Review of Systems  Constitutional: Positive for malaise/fatigue. Negative for chills, fever and weight loss.  HENT: Negative  for congestion, ear discharge and nosebleeds.   Eyes: Negative for blurred vision.  Respiratory: Negative for  cough, hemoptysis, sputum production, shortness of breath and wheezing.   Cardiovascular: Negative for chest pain, palpitations, orthopnea and claudication.  Gastrointestinal: Negative for abdominal pain, blood in stool, constipation, diarrhea, heartburn, melena, nausea and vomiting.  Genitourinary: Negative for dysuria, flank pain, frequency, hematuria and urgency.  Musculoskeletal: Negative for back pain, joint pain and myalgias.  Skin: Negative for rash.  Neurological: Negative for dizziness, tingling, focal weakness, seizures, weakness and headaches.  Endo/Heme/Allergies: Does not bruise/bleed easily.  Psychiatric/Behavioral: Negative for depression and suicidal ideas. The patient does not have insomnia.       No Known Allergies   Past Medical History:  Diagnosis Date  . Arthritis   . Asthma   . Cancer [facility])    prostate  . COPD (chronic obstructive pulmonary disease) ([place])   . Coronary artery disease   . Cough    chronic  . Diabetes mellitus without complication ([place])   . Edema   . Elevated PSA   . Headache   . Hypertension   . Orthopnea   . Oxygen deficit    o2 prn  . Shortness of breath dyspnea   . Sleep apnea    cpap     Past Surgical History:  Procedure Laterality Date  . CARDIAC CATHETERIZATION  2014  . COLONOSCOPY    . CYSTOSCOPY W/ RETROGRADES Bilateral 10/18/2016   Procedure: CYSTOSCOPY WITH RETROGRADE PYELOGRAM;  Surgeon: Hollice Espy, MD;  Location: [facility] ORS;  Service: Urology;  Laterality: Bilateral;  . EYE SURGERY    . KNEE ARTHROSCOPY    . TRANSURETHRAL RESECTION OF BLADDER TUMOR N/A 10/18/2016   Procedure: TRANSURETHRAL RESECTION OF BLADDER TUMOR (TURBT);  Surgeon: Hollice Espy, MD;  Location: [facility] ORS;  Service: Urology;  Laterality: N/A;  . TRANSURETHRAL RESECTION OF PROSTATE N/A 10/18/2016   Procedure: TRANSURETHRAL RESECTION OF THE PROSTATE (TURP) CHANNEL TURP;  Surgeon: Hollice Espy, MD;  Location: [facility] ORS;  Service: Urology;  Laterality:  N/A;    Social History   Social History  . Marital status: Single    Spouse name: N/A  . Number of children: N/A  . Years of education: N/A   Occupational History  . Not on file.   Social History Main Topics  . Smoking status: Current Every Day Smoker    Packs/day: 0.50    Years: 55.00  . Smokeless tobacco: Never Used  . Alcohol use No  . Drug use: No  . Sexual activity: Not on file   Other Topics Concern  . Not on file   Social History Narrative  . No narrative on file    Family History  Problem Relation Age of Onset  . CVA Mother   . CAD Father      Current Outpatient Prescriptions:  .  abiraterone Acetate (ZYTIGA) 250 MG tablet, Take 4 tablets (1,000 mg total) by mouth daily. Take on an empty stomach 1 hour before or 2 hours after a meal, Disp: 120 tablet, Rfl: 0 .  aspirin EC 325 MG tablet, Take 325 mg by mouth every morning., Disp: , Rfl:  .  atorvastatin (LIPITOR) 40 MG tablet, Take 1 tablet (40 mg total) by mouth daily., Disp: 30 tablet, Rfl: 0 .  bicalutamide (CASODEX) 50 MG tablet, Take 1 tablet (50 mg total) by mouth daily., Disp: 14 tablet, Rfl: 0 .  cloNIDine (CATAPRES) 0.3 MG tablet, Take 0.3 mg by mouth daily. , Disp: ,  Rfl:  .  DULoxetine (CYMBALTA) 30 MG capsule, Take 30 mg by mouth daily., Disp: , Rfl:  .  ferrous sulfate 325 (65 FE) MG EC tablet, Take 1 tablet (325 mg total) by mouth 2 (two) times daily with a meal., Disp: 60 tablet, Rfl: 3 .  finasteride (PROSCAR) 5 MG tablet, Take 5 mg by mouth daily., Disp: , Rfl:  .  Fluticasone-Salmeterol (ADVAIR) 250-50 MCG/DOSE AEPB, Inhale 1 puff into the lungs 2 (two) times daily., Disp: , Rfl:  .  GLIPIZIDE XL 10 MG 24 hr tablet, Take 10 mg by mouth 2 (two) times daily. , Disp: , Rfl:  .  hydrALAZINE (APRESOLINE) 100 MG tablet, 3 (three) times daily. , Disp: , Rfl:  .  hydrochlorothiazide (HYDRODIURIL) 25 MG tablet, Take 25 mg by mouth daily., Disp: , Rfl:  .  losartan (COZAAR) 100 MG tablet, Take 100 mg  by mouth daily., Disp: , Rfl:  .  metFORMIN (GLUCOPHAGE) 500 MG tablet, Take 500 mg by mouth 2 (two) times daily with a meal., Disp: , Rfl:  .  metoprolol (LOPRESSOR) 50 MG tablet, Take 50 mg by mouth 2 (two) times daily., Disp: , Rfl:  .  oxyCODONE (OXY IR/ROXICODONE) 5 MG immediate release tablet, Take 1 tablet (5 mg total) by mouth every 6 (six) hours as needed for moderate pain., Disp: 10 tablet, Rfl: 0 .  oxyCODONE-acetaminophen (ROXICET) 5-325 MG tablet, Take 1 tablet by mouth every 4 (four) hours as needed for severe pain., Disp: 20 tablet, Rfl: 0 .  predniSONE (DELTASONE) 5 MG tablet, Take 1 tablet (5 mg total) by mouth 2 (two) times daily with a meal., Disp: 60 tablet, Rfl: 0 .  tamsulosin (FLOMAX) 0.4 MG CAPS capsule, Take 1 capsule (0.4 mg total) by mouth daily., Disp: 90 capsule, Rfl: 3 .  vitamin B-12 (CYANOCOBALAMIN) 1000 MCG tablet, Take 1 tablet (1,000 mcg total) by mouth daily., Disp: 30 tablet, Rfl: 3  Physical exam:  Vitals:   01/12/17 0929  BP: (!) 150/66  Pulse: (!) 52  Resp: 18  Temp: (!) 94.9 F (34.9 C)  Weight: 224 lb 2 oz (101.7 kg)   Physical Exam  Constitutional: He is oriented to person, place, and time and well-developed, well-nourished, and in no distress.  HENT:  Head: Normocephalic and atraumatic.  Eyes: Pupils are equal, round, and reactive to light. EOM are normal.  Neck: Normal range of motion.  Cardiovascular: Normal rate, regular rhythm and normal heart sounds.   Pulmonary/Chest: Effort normal.  Breath sounds diminished  Abdominal: Soft. Bowel sounds are normal.  Neurological: He is alert and oriented to person, place, and time.  Skin: Skin is warm and dry.     CMP Latest Ref Rng & Units 01/04/2017  Glucose 65 - 99 mg/dL 100(H)  BUN 6 - 20 mg/dL 23(H)  Creatinine 0.61 - 1.24 mg/dL 0.84  Sodium 135 - 145 mmol/L 141  Potassium 3.5 - 5.1 mmol/L 4.0  Chloride 101 - 111 mmol/L 102  CO2 22 - 32 mmol/L 29  Calcium 8.9 - 10.3 mg/dL 8.8(L)  Total  Protein 6.5 - 8.1 g/dL -  Total Bilirubin 0.3 - 1.2 mg/dL -  Alkaline Phos 38 - 126 U/L -  AST 15 - 41 U/L -  ALT 17 - 63 U/L -   CBC Latest Ref Rng & Units 01/04/2017  WBC 3.8 - 10.6 K/uL 7.8  Hemoglobin 13.0 - 18.0 g/dL 10.9(L)  Hematocrit 40.0 - 52.0 % 32.3(L)  Platelets 150 -  440 K/uL 195    No images are attached to the encounter.  Dg Chest 2 View  Result Date: 01/04/2017 CLINICAL DATA:  Right-sided upper chest pain for several hours EXAM: CHEST  2 VIEW COMPARISON:  11/27/2016 FINDINGS: Cardiac shadow is within normal limits. Tortuosity of thoracic aorta is again seen. The lungs are well aerated bilaterally without focal infiltrate or sizable effusion. Stable osteoblastic lesions are seen particularly within the spine and manubrium. Multiple old rib deformities are noted bilaterally. No acute infiltrate or sizable effusion is seen. The known left upper lobe nodule is not well appreciated on this exam. IMPRESSION: Stable osseous metastatic disease. No acute abnormality noted. Electronically Signed   By: Inez Catalina M.D.   On: 01/04/2017 22:50   Nm Bone Scan Whole Body  Result Date: 12/22/2016 CLINICAL DATA:  Prostate cancer metastatic to bone. EXAM: NUCLEAR MEDICINE WHOLE BODY BONE SCAN TECHNIQUE: Whole body anterior and posterior images were obtained approximately 3 hours after intravenous injection of radiopharmaceutical. RADIOPHARMACEUTICALS:  22.78 mCi Technetium-32mMDP IV COMPARISON:  Bone scan of July 07, 2016. FINDINGS: There is a significant increase in the number of abnormal foci seen in the ribs consistent with worsening metastatic disease. There is a significant increase in the size of abnormal uptake foci in both proximal humeri consistent with worsening metastatic disease. Multiple foci of abnormal uptake are noted in the skull now consistent with metastatic disease. New focus of abnormal uptake is noted anteriorly in the right iliac wing as well as in right ischial  tuberosity consistent with worsening metastatic disease. There is significantly increased number and size of metastatic lesions seen involving both proximal femurs consistent with worsening metastatic disease. IMPRESSION: Overall, the number of metastatic lesions seen throughout the skeleton are significantly increased in size and number, consistent with worsening osseous metastatic disease. New metastatic lesions are also noted in the skull. Electronically Signed   By: JMarijo Conception M.D.   On: 12/22/2016 15:46   Ct Angio Chest/abd/pel For Dissection W And/or Wo Contrast  Result Date: 01/05/2017 CLINICAL DATA:  Chest pain starting today with pressure across the mid chest radiating to the back. Hypertension. EXAM: CT ANGIOGRAPHY CHEST, ABDOMEN AND PELVIS TECHNIQUE: Multidetector CT imaging through the chest, abdomen and pelvis was performed using the standard protocol during bolus administration of intravenous contrast. Multiplanar reconstructed images and MIPs were obtained and reviewed to evaluate the vascular anatomy. CONTRAST:  125 mL Isovue 370 COMPARISON:  CT chest 11/27/2016. CT chest abdomen and pelvis 07/07/2016 FINDINGS: CTA CHEST FINDINGS Cardiovascular: Noncontrast images of the chest demonstrate no evidence of intramural hematoma. Scattered aortic calcifications. Calcification in the coronary arteries. Calcified lymph nodes in the right hilum with calcified granulomas in the right lung. Preferential opacification of the thoracic aorta. No evidence of thoracic aortic aneurysm or dissection. Normal heart size. No pericardial effusion. Pulmonary arteries are well opacified without evidence of significant pulmonary embolus. Mediastinum/Nodes: No enlarged mediastinal, hilar, or axillary lymph nodes. Thyroid gland, trachea, and esophagus demonstrate no significant findings. Lungs/Pleura: Focal infiltration in the right lung base could indicate pneumonia. This is new since previous chest. Persistent  nodule in the left upper lung measuring 10 mm diameter. No significant change since previous study. No pleural effusions. No pneumothorax. Airways are patent. Musculoskeletal: Diffuse heterogeneous bone sclerosis consistent with diffuse bone metastasis from patient's known prostate carcinoma. Review of the MIP images confirms the above findings. CTA ABDOMEN AND PELVIS FINDINGS VASCULAR Aorta: Diffuse aortic calcification with eccentric noncalcified thrombus in the  distal aorta. No aneurysm. No dissection. Celiac: Patent without evidence of aneurysm, dissection, vasculitis or significant stenosis. SMA: Superior mesenteric artery is patent with prominent calcification. In the mid/distal SMA, there is circumferential calcification likely resulting in moderate stenosis. Flow is demonstrated distally. Renals: Both renal arteries are patent without evidence of aneurysm, dissection, vasculitis, fibromuscular dysplasia or significant stenosis. IMA: Patent without evidence of aneurysm, dissection, vasculitis or significant stenosis. 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 Veins: No obvious venous abnormality within the limitations of this arterial phase study. Review of the MIP images confirms the above findings. NON-VASCULAR Hepatobiliary: No focal liver abnormality is seen. No gallstones, gallbladder wall thickening, or biliary dilatation. Pancreas: Unremarkable. No pancreatic ductal dilatation or surrounding inflammatory changes. Spleen: Calcified granulomas in the spleen. Focal low-attenuation lesion measuring about a cm diameter likely representing a small hemangioma. No change. Adrenals/Urinary Tract: No adrenal gland nodules. Bilateral renal cortical atrophy or scarring. Nephrograms are symmetrical. No hydronephrosis or hydroureter. Bladder wall is not thickened. No filling defects. Stomach/Bowel: Stomach is within normal limits. Appendix appears normal. No evidence of bowel wall thickening,  distention, or inflammatory changes. Lymphatic: No significant lymphadenopathy. Reproductive: Prostate gland is atrophic. Other: Fat containing bilateral inguinal hernias. Small fat containing periumbilical hernia. No free air or free fluid in the abdomen. Musculoskeletal: Diffuse heterogeneous bone sclerosis consistent with diffuse metastatic disease. Review of the MIP images confirms the above findings. IMPRESSION: 1. No evidence of aneurysm or dissection of the thoracic or abdominal aorta. 2. Diffuse calcific atherosclerotic changes. 3. 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 4. Diffuse bone metastasis. 5. Indeterminate 1 cm nodule in the left upper lung. Electronically Signed   By: Lucienne Capers M.D.   On: 01/05/2017 03:45     Assessment and plan- Patient is a 71 y.o. 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 However in view of his worsening bone disease I would like to add Zytiga thousand milligrams daily along with prednisone 5 mg twice a day. I discussed the risks and benefits of Zytiga including all but not limited to hypertension, edema, fatigue, electrolyte disturbances and need to monitor kidney and liver functions patient understands and agrees to proceed as planned. 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 I will see him back one month after he started Zytiga and check CBC, CMP, PSA and testosterone at that time.Marland Kitchen He will be getting LFTs checked every 2 weeks for the first 3 months. He would be due for his next dose of Lupron in September 2018  Anemia of chronic disease- continue to monitor. Stable  Right upper lobe lung nodule- smaller in size on recent scans. Repeat CT scan recommended in  6 months    Visit Diagnosis 1. Prostate cancer metastatic to bone [facility])      Dr. Randa Evens, MD, MPH [facility] Pager- [phone] 01/12/2017 11:00 AM

## 2017-01-15 ENCOUNTER — Other Ambulatory Visit: Payer: Self-pay | Admitting: *Deleted

## 2017-01-15 MED ORDER — ABIRATERONE ACETATE 250 MG PO TABS: 1000.0000 mg | ORAL_TABLET | Freq: Every day | ORAL | 0 refills | Status: DC

## 2017-01-16 ENCOUNTER — Inpatient Hospital Stay: Payer: Medicare Other

## 2017-01-19 ENCOUNTER — Encounter: Payer: Self-pay | Admitting: Emergency Medicine

## 2017-01-19 ENCOUNTER — Observation Stay
Admit: 2017-01-19 | Discharge: 2017-01-19 | Disposition: A | Discharge status: Home / Self Care | Payer: Medicare Other | Attending: Internal Medicine | Admitting: Internal Medicine

## 2017-01-19 ENCOUNTER — Other Ambulatory Visit: Payer: Self-pay

## 2017-01-19 ENCOUNTER — Observation Stay
Admission: EM | Admit: 2017-01-19 | Discharge: 2017-01-19 | Disposition: A | Discharge status: Home / Self Care | Payer: Medicare Other | Attending: Internal Medicine | Admitting: Internal Medicine

## 2017-01-19 ENCOUNTER — Emergency Department: Payer: Medicare Other

## 2017-01-19 DIAGNOSIS — E1151 Type 2 diabetes mellitus with diabetic peripheral angiopathy without gangrene: Secondary | ICD-10-CM | POA: Insufficient documentation

## 2017-01-19 DIAGNOSIS — C7951 Secondary malignant neoplasm of bone: Secondary | ICD-10-CM | POA: Diagnosis not present

## 2017-01-19 DIAGNOSIS — I2510 Atherosclerotic heart disease of native coronary artery without angina pectoris: Secondary | ICD-10-CM | POA: Diagnosis not present

## 2017-01-19 DIAGNOSIS — F17210 Nicotine dependence, cigarettes, uncomplicated: Secondary | ICD-10-CM | POA: Diagnosis not present

## 2017-01-19 DIAGNOSIS — C61 Malignant neoplasm of prostate: Secondary | ICD-10-CM | POA: Diagnosis not present

## 2017-01-19 DIAGNOSIS — Z7984 Long term (current) use of oral hypoglycemic drugs: Secondary | ICD-10-CM | POA: Diagnosis not present

## 2017-01-19 DIAGNOSIS — Z8673 Personal history of transient ischemic attack (TIA), and cerebral infarction without residual deficits: Secondary | ICD-10-CM | POA: Diagnosis not present

## 2017-01-19 DIAGNOSIS — Z8249 Family history of ischemic heart disease and other diseases of the circulatory system: Secondary | ICD-10-CM | POA: Diagnosis not present

## 2017-01-19 DIAGNOSIS — I200 Unstable angina: Secondary | ICD-10-CM

## 2017-01-19 DIAGNOSIS — Z823 Family history of stroke: Secondary | ICD-10-CM | POA: Diagnosis not present

## 2017-01-19 DIAGNOSIS — I493 Ventricular premature depolarization: Secondary | ICD-10-CM | POA: Insufficient documentation

## 2017-01-19 DIAGNOSIS — E782 Mixed hyperlipidemia: Secondary | ICD-10-CM | POA: Diagnosis not present

## 2017-01-19 DIAGNOSIS — N179 Acute kidney failure, unspecified: Secondary | ICD-10-CM | POA: Insufficient documentation

## 2017-01-19 DIAGNOSIS — J449 Chronic obstructive pulmonary disease, unspecified: Secondary | ICD-10-CM | POA: Diagnosis not present

## 2017-01-19 DIAGNOSIS — Z79899 Other long term (current) drug therapy: Secondary | ICD-10-CM | POA: Insufficient documentation

## 2017-01-19 DIAGNOSIS — G4733 Obstructive sleep apnea (adult) (pediatric): Secondary | ICD-10-CM | POA: Insufficient documentation

## 2017-01-19 DIAGNOSIS — I10 Essential (primary) hypertension: Secondary | ICD-10-CM | POA: Diagnosis present

## 2017-01-19 DIAGNOSIS — R079 Chest pain, unspecified: Secondary | ICD-10-CM | POA: Diagnosis present

## 2017-01-19 DIAGNOSIS — Z7982 Long term (current) use of aspirin: Secondary | ICD-10-CM | POA: Diagnosis not present

## 2017-01-19 LAB — COMPREHENSIVE METABOLIC PANEL
ALBUMIN: 3.6 g/dL (ref 3.5–5.0)
ALT: 16 U/L — ABNORMAL LOW (ref 17–63)
ANION GAP: 10 (ref 5–15)
AST: 32 U/L (ref 15–41)
Alkaline Phosphatase: 813 U/L — ABNORMAL HIGH (ref 38–126)
BUN: 29 mg/dL — ABNORMAL HIGH (ref 6–20)
CHLORIDE: 102 mmol/L (ref 101–111)
CO2: 30 mmol/L (ref 22–32)
Calcium: 8.7 mg/dL — ABNORMAL LOW (ref 8.9–10.3)
Creatinine, Ser: 0.88 mg/dL (ref 0.61–1.24)
Glucose, Bld: 118 mg/dL — ABNORMAL HIGH (ref 65–99)
POTASSIUM: 3.9 mmol/L (ref 3.5–5.1)
SODIUM: 142 mmol/L (ref 135–145)
Total Bilirubin: 0.6 mg/dL (ref 0.3–1.2)
Total Protein: 7.5 g/dL (ref 6.5–8.1)

## 2017-01-19 LAB — GLUCOSE, CAPILLARY
GLUCOSE-CAPILLARY: 140 mg/dL — AB (ref 65–99)
Glucose-Capillary: 120 mg/dL — ABNORMAL HIGH (ref 65–99)
Glucose-Capillary: 136 mg/dL — ABNORMAL HIGH (ref 65–99)
Glucose-Capillary: 113 mg/dL — ABNORMAL HIGH (ref 65–99)

## 2017-01-19 LAB — TROPONIN I
TROPONIN I: 0.03 ng/mL — AB (ref <0.03)
Troponin I: <0.03 ng/mL (ref <0.03)

## 2017-01-19 LAB — CBC
HCT: 33.3 % — ABNORMAL LOW (ref 40.0–52.0)
Hemoglobin: 11.1 g/dL — ABNORMAL LOW (ref 13.0–18.0)
MCH: 32 pg (ref 26.0–34.0)
MCHC: 33.3 g/dL (ref 32.0–36.0)
MCV: 96 fL (ref 80.0–100.0)
PLATELETS: 201 10*3/uL (ref 150–440)
RBC: 3.47 MIL/uL — AB (ref 4.40–5.90)
RDW: 15.3 % — AB (ref 11.5–14.5)
WBC: 8.8 10*3/uL (ref 3.8–10.6)

## 2017-01-19 LAB — TSH: TSH: 1.505 u[IU]/mL (ref 0.350–4.500)

## 2017-01-19 LAB — ECHOCARDIOGRAM COMPLETE
Height: 70 in
WEIGHTICAEL: 3584 [oz_av]

## 2017-01-19 LAB — LIPASE, BLOOD: Lipase: 32 U/L (ref 11–51)

## 2017-01-19 LAB — HEMOGLOBIN A1C
Hgb A1c MFr Bld: 5.7 % — ABNORMAL HIGH (ref 4.8–5.6)
Mean Plasma Glucose: 116.89 mg/dL

## 2017-01-19 MED ORDER — CLONIDINE HCL 0.1 MG PO TABS
0.3000 mg | ORAL_TABLET | Freq: Every day | ORAL | Status: DC
Start: 2017-01-19 — End: 2017-01-19
  Administered 2017-01-19: 0.3 mg via ORAL
  Filled 2017-01-19: qty 3

## 2017-01-19 MED ORDER — NITROGLYCERIN 2 % TD OINT
1.0000 [in_us] | TOPICAL_OINTMENT | Freq: Once | TRANSDERMAL | Status: AC
  Administered 2017-01-19: 1 [in_us] via TOPICAL
  Filled 2017-01-19: qty 1

## 2017-01-19 MED ORDER — FINASTERIDE 5 MG PO TABS
5.0000 mg | ORAL_TABLET | Freq: Every day | ORAL | Status: DC
  Administered 2017-01-19: 5 mg via ORAL
  Filled 2017-01-19: qty 1

## 2017-01-19 MED ORDER — LOSARTAN POTASSIUM 50 MG PO TABS
100.0000 mg | ORAL_TABLET | Freq: Every day | ORAL | Status: DC
  Administered 2017-01-19: 100 mg via ORAL
  Filled 2017-01-19: qty 2

## 2017-01-19 MED ORDER — INSULIN ASPART 100 UNIT/ML ~~LOC~~ SOLN
0.0000 [IU] | Freq: Three times a day (TID) | SUBCUTANEOUS | Status: DC
  Administered 2017-01-19: 1 [IU] via SUBCUTANEOUS
  Filled 2017-01-19: qty 1

## 2017-01-19 MED ORDER — ENOXAPARIN SODIUM 40 MG/0.4ML ~~LOC~~ SOLN: 40.0000 mg | SUBCUTANEOUS | Status: DC

## 2017-01-19 MED ORDER — ACETAMINOPHEN 325 MG PO TABS
650.0000 mg | ORAL_TABLET | Freq: Four times a day (QID) | ORAL | Status: DC | PRN
  Administered 2017-01-19: 650 mg via ORAL
  Filled 2017-01-19: qty 2

## 2017-01-19 MED ORDER — ABIRATERONE ACETATE 250 MG PO TABS: 1000.0000 mg | ORAL_TABLET | Freq: Every day | ORAL | Status: DC

## 2017-01-19 MED ORDER — OXYCODONE-ACETAMINOPHEN 5-325 MG PO TABS: 1.0000 | ORAL_TABLET | ORAL | Status: DC | PRN

## 2017-01-19 MED ORDER — ONDANSETRON HCL 4 MG/2ML IJ SOLN
4.0000 mg | Freq: Four times a day (QID) | INTRAMUSCULAR | Status: DC | PRN
Start: 2017-01-19 — End: 2017-01-19

## 2017-01-19 MED ORDER — HYDROCHLOROTHIAZIDE 25 MG PO TABS
25.0000 mg | ORAL_TABLET | Freq: Every day | ORAL | Status: DC
  Administered 2017-01-19: 25 mg via ORAL
  Filled 2017-01-19: qty 1

## 2017-01-19 MED ORDER — CLONIDINE HCL 0.2 MG PO TABS: 0.2000 mg | ORAL_TABLET | Freq: Two times a day (BID) | ORAL | 2 refills | Status: DC

## 2017-01-19 MED ORDER — OXYCODONE-ACETAMINOPHEN 5-325 MG PO TABS
1.0000 | ORAL_TABLET | ORAL | Status: DC | PRN
  Administered 2017-01-19: 1 via ORAL
  Filled 2017-01-19: qty 1

## 2017-01-19 MED ORDER — PREDNISONE 10 MG PO TABS
5.0000 mg | ORAL_TABLET | Freq: Two times a day (BID) | ORAL | Status: DC
  Administered 2017-01-19 (×2): 5 mg via ORAL
  Filled 2017-01-19 (×2): qty 1

## 2017-01-19 MED ORDER — DULOXETINE HCL 30 MG PO CPEP
30.0000 mg | ORAL_CAPSULE | Freq: Every day | ORAL | Status: DC
  Administered 2017-01-19: 30 mg via ORAL
  Filled 2017-01-19: qty 1

## 2017-01-19 MED ORDER — ONDANSETRON HCL 4 MG PO TABS: 4.0000 mg | ORAL_TABLET | Freq: Four times a day (QID) | ORAL | Status: DC | PRN

## 2017-01-19 MED ORDER — ACETAMINOPHEN 650 MG RE SUPP: 650.0000 mg | Freq: Four times a day (QID) | RECTAL | Status: DC | PRN

## 2017-01-19 MED ORDER — DOCUSATE SODIUM 100 MG PO CAPS
100.0000 mg | ORAL_CAPSULE | Freq: Two times a day (BID) | ORAL | Status: DC
  Administered 2017-01-19: 100 mg via ORAL
  Filled 2017-01-19: qty 1

## 2017-01-19 MED ORDER — LABETALOL HCL 5 MG/ML IV SOLN
5.0000 mg | INTRAVENOUS | Status: DC | PRN
  Administered 2017-01-19: 5 mg via INTRAVENOUS
  Filled 2017-01-19: qty 4

## 2017-01-19 MED ORDER — ATORVASTATIN CALCIUM 20 MG PO TABS
40.0000 mg | ORAL_TABLET | Freq: Every day | ORAL | Status: DC
  Administered 2017-01-19: 40 mg via ORAL
  Filled 2017-01-19: qty 2

## 2017-01-19 MED ORDER — TAMSULOSIN HCL 0.4 MG PO CAPS
0.4000 mg | ORAL_CAPSULE | Freq: Every day | ORAL | Status: DC
  Administered 2017-01-19: 0.4 mg via ORAL
  Filled 2017-01-19: qty 1

## 2017-01-19 MED ORDER — MOMETASONE FURO-FORMOTEROL FUM 200-5 MCG/ACT IN AERO
2.0000 | INHALATION_SPRAY | Freq: Two times a day (BID) | RESPIRATORY_TRACT | Status: DC
  Administered 2017-01-19: 2 via RESPIRATORY_TRACT
  Filled 2017-01-19: qty 8.8

## 2017-01-19 MED ORDER — MORPHINE SULFATE (PF) 2 MG/ML IV SOLN
2.0000 mg | Freq: Once | INTRAVENOUS | Status: AC
  Administered 2017-01-19: 2 mg via INTRAVENOUS
  Filled 2017-01-19: qty 1

## 2017-01-19 MED ORDER — METOPROLOL TARTRATE 50 MG PO TABS
50.0000 mg | ORAL_TABLET | Freq: Two times a day (BID) | ORAL | Status: DC
  Administered 2017-01-19: 50 mg via ORAL
  Filled 2017-01-19: qty 1

## 2017-01-19 MED ORDER — FERROUS SULFATE 325 (65 FE) MG PO TABS
325.0000 mg | ORAL_TABLET | Freq: Two times a day (BID) | ORAL | Status: DC
  Administered 2017-01-19 (×2): 325 mg via ORAL
  Filled 2017-01-19 (×2): qty 1

## 2017-01-19 MED ORDER — HYDRALAZINE HCL 50 MG PO TABS
100.0000 mg | ORAL_TABLET | Freq: Three times a day (TID) | ORAL | Status: DC
  Administered 2017-01-19 (×2): 100 mg via ORAL
  Filled 2017-01-19 (×2): qty 2

## 2017-01-19 MED ORDER — INSULIN ASPART 100 UNIT/ML ~~LOC~~ SOLN: 0.0000 [IU] | Freq: Every day | SUBCUTANEOUS | Status: DC

## 2017-01-19 MED ORDER — VITAMIN B-12 1000 MCG PO TABS
1000.0000 ug | ORAL_TABLET | Freq: Every day | ORAL | Status: DC
  Administered 2017-01-19: 1000 ug via ORAL
  Filled 2017-01-19: qty 1

## 2017-01-19 MED ORDER — ASPIRIN EC 325 MG PO TBEC
325.0000 mg | DELAYED_RELEASE_TABLET | ORAL | Status: DC
  Administered 2017-01-19: 325 mg via ORAL
  Filled 2017-01-19: qty 1

## 2017-01-19 MED ORDER — ONDANSETRON HCL 4 MG/2ML IJ SOLN
4.0000 mg | Freq: Once | INTRAMUSCULAR | Status: AC
  Administered 2017-01-19: 4 mg via INTRAVENOUS
  Filled 2017-01-19: qty 2

## 2017-01-19 MED ORDER — BICALUTAMIDE 50 MG PO TABS
50.0000 mg | ORAL_TABLET | Freq: Every day | ORAL | Status: DC
  Administered 2017-01-19: 50 mg via ORAL
  Filled 2017-01-19: qty 1

## 2017-01-19 NOTE — ED Provider Notes (Signed)
[facility] Emergency Department Provider Note   ____________________________________________   First MD Initiated Contact with Patient 01/19/17 [phone]     (approximate)  I have reviewed the triage vital signs and the nursing notes.   HISTORY  Chief Complaint Chest Pain    HPI Michael Mathews is a 71 y.o. male who presents to the ED from home with a chief complaint of left-sided chest pain. Patient has multiple medical problemsincluding active metastatic prostate cancer who complains of left-sided chest pain since yesterday evening. Onset while watching TV. Associated with diaphoresis, mild shortness of breath and radiation to his back. Denies associated nausea, vomiting, palpitations or dizziness. Patient was seen over one week ago for similar presentation; had CT chest which was negative for PE or dissection. 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 Patient reports chest pain this evening is different from prior. Denies associated fever, chills, abdominal pain, diarrhea. Has chronic back pain. Denies recent travel or trauma. Took a full strength aspirin prior to arrival with partial relief of symptoms.   Past Medical History:  Diagnosis Date  . Arthritis   . Asthma   . Cancer [facility])    prostate  . COPD (chronic obstructive pulmonary disease) ([place])   . Coronary artery disease   . Cough    chronic  . Diabetes mellitus without complication ([place])   . Edema   . Elevated PSA   . Headache   . Hypertension   . Orthopnea   . Oxygen deficit    o2 prn  . Shortness of breath dyspnea   . Sleep apnea    cpap    Patient Active Problem List   Diagnosis Date Noted  . Prostate cancer metastatic to bone ([place]) 07/28/2016  . Goals of care, counseling/discussion 07/28/2016  . Elevated PSA 05/01/2016  . Urinary retention 05/01/2016  . Pneumonia 03/23/2016  . ARF (acute renal failure) ([place]) 03/11/2016  . Dizziness 03/03/2016  .  Mixed hyperlipidemia 09/01/2015  . OSA (obstructive sleep apnea) 09/01/2015  . Atherosclerotic peripheral vascular disease with intermittent claudication ([place]) 04/16/2015  . Lumbar radiculitis 08/13/2014  . Lumbar stenosis with neurogenic claudication 08/13/2014  . Cardiomyopathy ([place]) 01/23/2013  . DOE (dyspnea on exertion) 01/23/2013  . Hyperlipidemia 01/23/2013  . PVC (premature ventricular contraction) 01/23/2013  . Nonspecific elevation of levels of transaminase and lactic acid dehydrogenase (ldh) 07/21/2011  . Type 2 diabetes mellitus without complications ([place]) 56/43/3295  . Sciatica 06/28/2010  . Chronic obstructive pulmonary disease ([place]) 12/02/2008  . Essential (primary) hypertension 09/05/2007  . Personal history of transient ischemic attack (TIA), and cerebral infarction without residual deficits 09/05/2007    Past Surgical History:  Procedure Laterality Date  . CARDIAC CATHETERIZATION  2014  . COLONOSCOPY    . CYSTOSCOPY W/ RETROGRADES Bilateral 10/18/2016   Procedure: CYSTOSCOPY WITH RETROGRADE PYELOGRAM;  Surgeon: Hollice Espy, MD;  Location: [facility] ORS;  Service: Urology;  Laterality: Bilateral;  . EYE SURGERY    . KNEE ARTHROSCOPY    . TRANSURETHRAL RESECTION OF BLADDER TUMOR N/A 10/18/2016   Procedure: TRANSURETHRAL RESECTION OF BLADDER TUMOR (TURBT);  Surgeon: Hollice Espy, MD;  Location: [facility] ORS;  Service: Urology;  Laterality: N/A;  . TRANSURETHRAL RESECTION OF PROSTATE N/A 10/18/2016   Procedure: TRANSURETHRAL RESECTION OF THE PROSTATE (TURP) CHANNEL TURP;  Surgeon: Hollice Espy, MD;  Location: [facility] ORS;  Service: Urology;  Laterality: N/A;    Prior to Admission medications   Medication Sig Start Date End  Date Taking? Authorizing Provider  abiraterone Acetate (ZYTIGA) 250 MG tablet Take 4 tablets (1,000 mg total) by mouth daily. Take on an empty stomach 1 hour before or 2 hours after a meal 01/15/17  Yes Sindy Guadeloupe, MD  aspirin EC 325 MG tablet Take 325 mg  by mouth every morning.   Yes [provider]  atorvastatin (LIPITOR) 40 MG tablet Take 1 tablet (40 mg total) by mouth daily. 03/24/16  Yes Mody, Ulice Bold, MD  bicalutamide (CASODEX) 50 MG tablet Take 1 tablet (50 mg total) by mouth daily. 07/28/16  Yes Sindy Guadeloupe, MD  cloNIDine (CATAPRES) 0.3 MG tablet Take 0.3 mg by mouth daily.    Yes [provider]  DULoxetine (CYMBALTA) 30 MG capsule Take 30 mg by mouth daily. 01/04/16  Yes [provider]  ferrous sulfate 325 (65 FE) MG EC tablet Take 1 tablet (325 mg total) by mouth 2 (two) times daily with a meal. 07/28/16  Yes Sindy Guadeloupe, MD  finasteride (PROSCAR) 5 MG tablet Take 5 mg by mouth daily.   Yes [provider]  Fluticasone-Salmeterol (ADVAIR) 250-50 MCG/DOSE AEPB Inhale 1 puff into the lungs 2 (two) times daily.   Yes [provider]  GLIPIZIDE XL 10 MG 24 hr tablet Take 10 mg by mouth 2 (two) times daily.  01/19/16  Yes [provider]  hydrALAZINE (APRESOLINE) 100 MG tablet Take 100 mg by mouth 3 (three) times daily.  05/31/16  Yes [provider]  hydrochlorothiazide (HYDRODIURIL) 25 MG tablet Take 25 mg by mouth daily.   Yes [provider]  losartan (COZAAR) 100 MG tablet Take 100 mg by mouth daily.   Yes [provider]  metFORMIN (GLUCOPHAGE) 500 MG tablet Take 500 mg by mouth 2 (two) times daily with a meal.   Yes [provider]  metoprolol (LOPRESSOR) 50 MG tablet Take 50 mg by mouth 2 (two) times daily.   Yes [provider]  oxyCODONE-acetaminophen (ROXICET) 5-325 MG tablet Take 1 tablet by mouth every 4 (four) hours as needed for severe pain. 01/05/17  Yes Gregor Hams, MD  predniSONE (DELTASONE) 5 MG tablet Take 1 tablet (5 mg total) by mouth 2 (two) times daily with a meal. 01/12/17  Yes Sindy Guadeloupe, MD  tamsulosin (FLOMAX) 0.4 MG CAPS capsule Take 1 capsule (0.4 mg total) by mouth daily. 05/29/16  Yes [name], MD    vitamin B-12 (CYANOCOBALAMIN) 1000 MCG tablet Take 1 tablet (1,000 mcg total) by mouth daily. 07/28/16  Yes Sindy Guadeloupe, MD  oxyCODONE (OXY IR/ROXICODONE) 5 MG immediate release tablet Take 1 tablet (5 mg total) by mouth every 6 (six) hours as needed for moderate pain. Patient not taking: Reported on 01/19/2017 10/19/16   Zara Council A, PA-C    Allergies Patient has no known allergies.  Family History  Problem Relation Age of Onset  . CVA Mother   . CAD Father     Social History Social History  Substance Use Topics  . Smoking status: Current Every Day Smoker    Packs/day: 0.50    Years: 55.00  . Smokeless tobacco: Never Used  . Alcohol use No    Review of Systems  Constitutional: No fever/chills. Eyes: No visual changes. ENT: No sore throat. Cardiovascular: Positive for chest pain. Respiratory: Positive for shortness of breath. Gastrointestinal: No abdominal pain.  No nausea, no vomiting.  No diarrhea.  No constipation. Genitourinary: Negative for dysuria. Musculoskeletal: Negative for back  pain. Skin: Negative for rash. Neurological: Negative for headaches, focal weakness or numbness.   ____________________________________________   PHYSICAL EXAM:  VITAL SIGNS: ED Triage Vitals  Enc Vitals Group     BP 01/19/17 0219 (!) 233/83     Pulse Rate 01/19/17 0219 72     Resp 01/19/17 0219 18     Temp 01/19/17 0219 (!) 97.5 F (36.4 C)     Temp Source 01/19/17 0219 Oral     SpO2 01/19/17 0219 97 %     Weight 01/19/17 0216 224 lb (101.6 kg)     Height 01/19/17 0216 5\' 10"  (1.778 m)     Head Circumference --      Peak Flow --      Pain Score 01/19/17 0216 8     Pain Loc --      Pain Edu? --      Excl. in [place]? --     Constitutional: Alert and oriented. Well appearing and in mild acute distress. Eyes: Conjunctivae are normal. PERRL. EOMI. Head: Atraumatic. Nose: No congestion/rhinnorhea. Mouth/Throat: Mucous membranes are moist.  Oropharynx  non-erythematous. Neck: No stridor.   Cardiovascular: Normal rate, regular rhythm. Grossly normal heart sounds.  Good peripheral circulation. Respiratory: Normal respiratory effort.  No retractions. Lungs CTAB. Gastrointestinal: Soft and nontender. No distention. No abdominal bruits. No CVA tenderness. Musculoskeletal: No lower extremity tenderness nor edema.  No joint effusions. Neurologic:  Normal speech and language. No gross focal neurologic deficits are appreciated. No gait instability. Skin:  Skin is warm, diaphoretic and intact. No rash noted. Psychiatric: Mood and affect are normal. Speech and behavior are normal.  ____________________________________________   LABS (all labs ordered are listed, but only abnormal results are displayed)  Labs Reviewed  CBC - Abnormal; Notable for the following:       Result Value   RBC 3.47 (*)    Hemoglobin 11.1 (*)    HCT 33.3 (*)    RDW 15.3 (*)    All other components within normal limits  TROPONIN I - Abnormal; Notable for the following:    Troponin I 0.03 (*)    All other components within normal limits  GLUCOSE, CAPILLARY - Abnormal; Notable for the following:    Glucose-Capillary 140 (*)    All other components within normal limits  COMPREHENSIVE METABOLIC PANEL  LIPASE, BLOOD   ____________________________________________  EKG  ED ECG REPORT I, Daryl Beehler J, the attending physician, personally viewed and interpreted this ECG.   Date: 01/19/2017  EKG Time: 0215  Rate: 78  Rhythm: normal EKG, normal sinus rhythm  Axis: Normal  Intervals:none  ST&T Change: ST and T wave abnormality in inferior lateral leads No significant change from 01/07/2017  ____________________________________________  RADIOLOGY  Dg Chest 2 View  Result Date: 01/19/2017 CLINICAL DATA:  LEFT upper chest pain beginning yesterday afternoon. History of COPD, prostate cancer. EXAM: CHEST  2 VIEW COMPARISON:  Chest radiograph January 04, 2017 and CT chest  January 05, 2017 FINDINGS: Cardiomediastinal silhouette is normal. No pleural effusions or focal consolidations. Nipple shadows projecting in lung bases. Increased lung volumes with flattened hemidiaphragms. Trachea projects midline and there is no pneumothorax. Re- demonstration diffusely sclerotic axial skeleton consistent with known prostate metastatic cancer. Old RIGHT rib fractures. IMPRESSION: COPD.  No acute cardiopulmonary process. Electronically Signed   By: Elon Alas M.D.   On: 01/19/2017 03:14    ____________________________________________   PROCEDURES  Procedure(s) performed: None  Procedures  Critical Care performed:   CRITICAL CARE  Performed by: Paulette Blanch   Total critical care time: 30 minutes  Critical care time was exclusive of separately billable procedures and treating other patients.  Critical care was necessary to treat or prevent imminent or life-threatening deterioration.  Critical care was time spent personally by me on the following activities: development of treatment plan with patient and/or surrogate as well as nursing, discussions with consultants, evaluation of patient's response to treatment, examination of patient, obtaining history from patient or surrogate, ordering and performing treatments and interventions, ordering and review of laboratory studies, ordering and review of radiographic studies, pulse oximetry and re-evaluation of patient's condition.  ____________________________________________   INITIAL IMPRESSION / ASSESSMENT AND PLAN / ED COURSE  Pertinent labs & imaging results that were available during my care of the patient were reviewed by me and considered in my medical decision making (see chart for details).  71 year old male with diabetes, COPD, metastatic prostate cancer who presents with left-sided chest pain concerning for unstable angina. Full strength aspirin taken prior to arrival. Will administer nitroglycerin and  morphine for chest discomfort, obtain screening lab work, chest x-ray and reassess.  Clinical Course as of Jan 19 417  Fri Jan 19, 2017  0407 Chest pain improved after nitroglycerin and morphine. Updated patient of elevated troponin. Will discuss with hospitalist to evaluate patient in the emergency department for admission.  [JS]    Clinical Course User Index [JS] Paulette Blanch, MD     ____________________________________________   FINAL CLINICAL IMPRESSION(S) / ED DIAGNOSES  Final diagnoses:  Chest pain, unspecified type  Essential hypertension  Unstable angina (HCC)      NEW MEDICATIONS STARTED DURING THIS VISIT:  New Prescriptions   No medications on file     Note:  This document was prepared using Dragon voice recognition software and may include unintentional dictation errors.    Paulette Blanch, MD 01/19/17 [phone]

## 2017-01-19 NOTE — ED Notes (Signed)
Lab called this RN to redraw green top due to hemolysis. Green top redrawn and sent to lab.

## 2017-01-19 NOTE — Consult Note (Signed)
[facility]  CARDIOLOGY CONSULT NOTE  Patient ID: Michael Mathews MRN: [id number] DOB/AGE: 02-02-1946 71 y.o.  Admit date: 01/19/2017 Referring Physician Dr. Tressia Miners Primary Physician   Primary Cardiologist Dr. Nehemiah Massed Reason for Consultation chest pain   HPI: Patient is a 71 year old male with history of COPD, coronary artery disease who presented to emergency room with an episode of chest pain. It is somewhat atypical. Begin his left chest radiograph into the back. He had no diaphoresis nausea or vomiting. He was somewhat hypertensive on presentation to the emergency room. He ruled out for myocardial infarction. Electrocardiogram was unremarkable for ischemia. He has a known ejection fraction of 55% by echocardiogram one year ago. He had a negative functional study 1 year ago. His symptoms have improved. Echocardiogram during this hospitalization revealed no change in his ejection fraction or change in valvular disease. He is currently pain-free and anxious to go home.  Review of Systems  Constitutional: Negative.   HENT: Negative.   Eyes: Negative.   Respiratory: Positive for shortness of breath.   Cardiovascular: Positive for chest pain.  Gastrointestinal: Negative.   Genitourinary: Negative.   Musculoskeletal: Negative.   Skin: Negative.   Neurological: Negative.   Endo/Heme/Allergies: Negative.   Psychiatric/Behavioral: Negative.     Past Medical History:  Diagnosis Date  . Arthritis   . Asthma   . Cancer [facility])    prostate  . COPD (chronic obstructive pulmonary disease) ([place])   . Coronary artery disease   . Cough    chronic  . Diabetes mellitus without complication ([place])   . Edema   . Elevated PSA   . Headache   . Hypertension   . Orthopnea   . Oxygen deficit    o2 prn  . Shortness of breath dyspnea   . Sleep apnea    cpap    Family History  Problem Relation Age of Onset  . CVA Mother   . CAD Father       Social History   Social History  . Marital status: Single    Spouse name: N/A  . Number of children: N/A  . Years of education: N/A   Occupational History  . Not on file.   Social History Main Topics  . Smoking status: Current Every Day Smoker    Packs/day: 0.50    Years: 55.00  . Smokeless tobacco: Never Used  . Alcohol use No  . Drug use: No  . Sexual activity: Not on file   Other Topics Concern  . Not on file   Social History Narrative  . No narrative on file    Past Surgical History:  Procedure Laterality Date  . CARDIAC CATHETERIZATION  2014  . COLONOSCOPY    . CYSTOSCOPY W/ RETROGRADES Bilateral 10/18/2016   Procedure: CYSTOSCOPY WITH RETROGRADE PYELOGRAM;  Surgeon: Hollice Espy, MD;  Location: [facility] ORS;  Service: Urology;  Laterality: Bilateral;  . EYE SURGERY    . KNEE ARTHROSCOPY    . TRANSURETHRAL RESECTION OF BLADDER TUMOR N/A 10/18/2016   Procedure: TRANSURETHRAL RESECTION OF BLADDER TUMOR (TURBT);  Surgeon: Hollice Espy, MD;  Location: [facility] ORS;  Service: Urology;  Laterality: N/A;  . TRANSURETHRAL RESECTION OF PROSTATE N/A 10/18/2016   Procedure: TRANSURETHRAL RESECTION OF THE PROSTATE (TURP) CHANNEL TURP;  Surgeon: Hollice Espy, MD;  Location: [facility] ORS;  Service: Urology;  Laterality: N/A;     Prescriptions Prior to Admission  Medication Sig Dispense Refill Last Dose  .  abiraterone Acetate (ZYTIGA) 250 MG tablet Take 4 tablets (1,000 mg total) by mouth daily. Take on an empty stomach 1 hour before or 2 hours after a meal 120 tablet 0 01/18/2017 at Unknown time  . aspirin EC 325 MG tablet Take 325 mg by mouth every morning.   01/18/2017 at Unknown time  . atorvastatin (LIPITOR) 40 MG tablet Take 1 tablet (40 mg total) by mouth daily. 30 tablet 0 01/18/2017 at Unknown time  . bicalutamide (CASODEX) 50 MG tablet Take 1 tablet (50 mg total) by mouth daily. 14 tablet 0 01/18/2017 at Unknown time  . cloNIDine (CATAPRES) 0.3 MG tablet Take 0.3 mg by mouth daily.     01/18/2017 at Unknown time  . DULoxetine (CYMBALTA) 30 MG capsule Take 30 mg by mouth daily.   01/18/2017 at Unknown time  . ferrous sulfate 325 (65 FE) MG EC tablet Take 1 tablet (325 mg total) by mouth 2 (two) times daily with a meal. 60 tablet 3 01/18/2017 at Unknown time  . finasteride (PROSCAR) 5 MG tablet Take 5 mg by mouth daily.   01/18/2017 at Unknown time  . Fluticasone-Salmeterol (ADVAIR) 250-50 MCG/DOSE AEPB Inhale 1 puff into the lungs 2 (two) times daily.   01/18/2017 at Unknown time  . GLIPIZIDE XL 10 MG 24 hr tablet Take 10 mg by mouth 2 (two) times daily.    01/18/2017 at Unknown time  . hydrALAZINE (APRESOLINE) 100 MG tablet Take 100 mg by mouth 3 (three) times daily.    01/18/2017 at Unknown time  . hydrochlorothiazide (HYDRODIURIL) 25 MG tablet Take 25 mg by mouth daily.   01/18/2017 at Unknown time  . losartan (COZAAR) 100 MG tablet Take 100 mg by mouth daily.   01/18/2017 at Unknown time  . metFORMIN (GLUCOPHAGE) 500 MG tablet Take 500 mg by mouth 2 (two) times daily with a meal.   01/18/2017 at Unknown time  . metoprolol (LOPRESSOR) 50 MG tablet Take 50 mg by mouth 2 (two) times daily.   01/18/2017 at Unknown time  . oxyCODONE-acetaminophen (ROXICET) 5-325 MG tablet Take 1 tablet by mouth every 4 (four) hours as needed for severe pain. 20 tablet 0 01/18/2017 at Unknown time  . predniSONE (DELTASONE) 5 MG tablet Take 1 tablet (5 mg total) by mouth 2 (two) times daily with a meal. 60 tablet 0 01/18/2017 at Unknown time  . tamsulosin (FLOMAX) 0.4 MG CAPS capsule Take 1 capsule (0.4 mg total) by mouth daily. 90 capsule 3 01/18/2017 at Unknown time  . vitamin B-12 (CYANOCOBALAMIN) 1000 MCG tablet Take 1 tablet (1,000 mcg total) by mouth daily. 30 tablet 3 01/18/2017 at Unknown time  . oxyCODONE (OXY IR/ROXICODONE) 5 MG immediate release tablet Take 1 tablet (5 mg total) by mouth every 6 (six) hours as needed for moderate pain. (Patient not taking: Reported on 01/19/2017) 10 tablet 0 Not Taking at Unknown time     Physical Exam: Blood pressure (!) 165/85, pulse 86, temperature 98.1 F (36.7 C), temperature source Oral, resp. rate 18, height 5\' 10"  (1.778 m), weight 101.6 kg (224 lb), SpO2 95 %.   Wt Readings from Last 1 Encounters:  01/19/17 101.6 kg (224 lb)     General appearance: alert and cooperative Head: Normocephalic, without obvious abnormality, atraumatic Resp: clear to auscultation bilaterally Chest wall: no tenderness Cardio: regular rate and rhythm GI: soft, non-tender; bowel sounds normal; no masses,  no organomegaly Extremities: extremities normal, atraumatic, no cyanosis or edema Neurologic: Grossly normal  Labs:  Lab Results  Component Value Date   WBC 8.8 01/19/2017   HGB 11.1 (L) 01/19/2017   HCT 33.3 (L) 01/19/2017   MCV 96.0 01/19/2017   PLT 201 01/19/2017    Recent Labs Lab 01/19/17 0453  NA 142  K 3.9  CL 102  CO2 30  BUN 29*  CREATININE 0.88  CALCIUM 8.7*  PROT 7.5  BILITOT 0.6  ALKPHOS 813*  ALT 16*  AST 32  GLUCOSE 118*   Lab Results  Component Value Date   TROPONINI <0.03 01/19/2017      Radiology: No acute cardiopulmonary disease EKG: Sinus rhythm with no ischemia  ASSESSMENT AND PLAN:  71 year old male with history of reported cardiovascular disease although no documented catheter report he had a negative functional study within the last 2 years. Echocardiogram showed preserved LV chest pain is atypical. He has ruled out for myocardial infarction. Echocardiogram during this admission revealed no change from baseline. Would ambulate and discharged today on current regimen with outpatient follow-up early next week with Dr. Nehemiah Massed. No further cardiac workup indicated during this hospitalization his pain does not appear ischemic in nature. Signed: Teodoro Spray MD, [facility] 01/19/2017, 1:51 PM

## 2017-01-19 NOTE — ED Notes (Signed)
Pt reports chronic back and bilateral leg pain.

## 2017-01-19 NOTE — Care Management Obs Status (Signed)
[facility] NOTIFICATION   Patient Details  Name: Michael Mathews MRN: [id number] Date of Birth: March 21, 1946   Medicare Observation Status Notification Given:  Yes Notice signed, one given to patient and the other to HIM for scanning   Katrina Stack, RN 01/19/2017, 9:20 AM

## 2017-01-19 NOTE — Progress Notes (Signed)
Patient is discharge home in a stable condition,summary and f/u care given verbalized understanding  

## 2017-01-19 NOTE — H&P (Signed)
Michael Mathews is an 71 y.o. male.   Chief Complaint: chest pain HPI:  The patient with past medical history of coronary artery disease, diabetes as well as COPD and prostate cancer presents emergency department complaining of chest pain. The patient states the pain began in his left chest and radiated around to his posterior chest. He denies pain or numbness in his arms but admits to diaphoresis and mild shortness of breath.the pain is somewhat different in character and location than the pain he experienced 2 weeks ago when he presented to the emergency department. He also denies nausea or vomiting. Upon arrival to the emergency department the patient's systolic blood pressure was greater than 200. He was given nitroglycerin as well as aspirin which relieved his pain and improved his blood pressure some. However, due to his risk factors emergency department staff called the hospitalist service for admission.  Past Medical History:  Diagnosis Date  . Arthritis   . Asthma   . Cancer [facility])    prostate  . COPD (chronic obstructive pulmonary disease) ([place])   . Coronary artery disease   . Cough    chronic  . Diabetes mellitus without complication ([place])   . Edema   . Elevated PSA   . Headache   . Hypertension   . Orthopnea   . Oxygen deficit    o2 prn  . Shortness of breath dyspnea   . Sleep apnea    cpap    Past Surgical History:  Procedure Laterality Date  . CARDIAC CATHETERIZATION  2014  . COLONOSCOPY    . CYSTOSCOPY W/ RETROGRADES Bilateral 10/18/2016   Procedure: CYSTOSCOPY WITH RETROGRADE PYELOGRAM;  Surgeon: Hollice Espy, MD;  Location: [facility] ORS;  Service: Urology;  Laterality: Bilateral;  . EYE SURGERY    . KNEE ARTHROSCOPY    . TRANSURETHRAL RESECTION OF BLADDER TUMOR N/A 10/18/2016   Procedure: TRANSURETHRAL RESECTION OF BLADDER TUMOR (TURBT);  Surgeon: Hollice Espy, MD;  Location: [facility] ORS;  Service: Urology;  Laterality: N/A;  . TRANSURETHRAL RESECTION OF PROSTATE N/A  10/18/2016   Procedure: TRANSURETHRAL RESECTION OF THE PROSTATE (TURP) CHANNEL TURP;  Surgeon: Hollice Espy, MD;  Location: [facility] ORS;  Service: Urology;  Laterality: N/A;    Family History  Problem Relation Age of Onset  . CVA Mother   . CAD Father    Social History:  reports that he has been smoking.  He has a 27.50 pack-year smoking history. He has never used smokeless tobacco. He reports that he does not drink alcohol or use drugs.  Allergies: No Known Allergies   (Not in a hospital admission)  Results for orders placed or performed during the hospital encounter of 01/19/17 (from the past 48 hour(s))  CBC     Status: Abnormal   Collection Time: 01/19/17  2:47 AM  Result Value Ref Range   WBC 8.8 3.8 - 10.6 K/uL   RBC 3.47 (L) 4.40 - 5.90 MIL/uL   Hemoglobin 11.1 (L) 13.0 - 18.0 g/dL   HCT 33.3 (L) 40.0 - 52.0 %   MCV 96.0 80.0 - 100.0 fL   MCH 32.0 26.0 - 34.0 pg   MCHC 33.3 32.0 - 36.0 g/dL   RDW 15.3 (H) 11.5 - 14.5 %   Platelets 201 150 - 440 K/uL  Troponin I     Status: Abnormal   Collection Time: 01/19/17  2:47 AM  Result Value Ref Range   Troponin I 0.03 (HH) <0.03 ng/mL    Comment: CRITICAL RESULT  CALLED TO, READ BACK BY AND VERIFIED WITH RAQUEL DAVID @ 9628 ON 01/19/2017 BY CAF   Glucose, capillary     Status: Abnormal   Collection Time: 01/19/17  2:50 AM  Result Value Ref Range   Glucose-Capillary 140 (H) 65 - 99 mg/dL  Comprehensive metabolic panel     Status: Abnormal   Collection Time: 01/19/17  4:53 AM  Result Value Ref Range   Sodium 142 135 - 145 mmol/L   Potassium 3.9 3.5 - 5.1 mmol/L   Chloride 102 101 - 111 mmol/L   CO2 30 22 - 32 mmol/L   Glucose, Bld 118 (H) 65 - 99 mg/dL   BUN 29 (H) 6 - 20 mg/dL   Creatinine, Ser 0.88 0.61 - 1.24 mg/dL   Calcium 8.7 (L) 8.9 - 10.3 mg/dL   Total Protein 7.5 6.5 - 8.1 g/dL   Albumin 3.6 3.5 - 5.0 g/dL   AST 32 15 - 41 U/L   ALT 16 (L) 17 - 63 U/L   Alkaline Phosphatase 813 (H) 38 - 126 U/L   Total  Bilirubin 0.6 0.3 - 1.2 mg/dL   GFR calc non Af Amer >60 >60 mL/min   GFR calc Af Amer >60 >60 mL/min    Comment: (NOTE) The eGFR has been calculated using the CKD EPI equation. This calculation has not been validated in all clinical situations. eGFR's persistently <60 mL/min signify possible Chronic Kidney Disease.    Anion gap 10 5 - 15  Lipase, blood     Status: None   Collection Time: 01/19/17  4:53 AM  Result Value Ref Range   Lipase 32 11 - 51 U/L   Dg Chest 2 View  Result Date: 01/19/2017 CLINICAL DATA:  LEFT upper chest pain beginning yesterday afternoon. History of COPD, prostate cancer. EXAM: CHEST  2 VIEW COMPARISON:  Chest radiograph January 04, 2017 and CT chest January 05, 2017 FINDINGS: Cardiomediastinal silhouette is normal. No pleural effusions or focal consolidations. Nipple shadows projecting in lung bases. Increased lung volumes with flattened hemidiaphragms. Trachea projects midline and there is no pneumothorax. Re- demonstration diffusely sclerotic axial skeleton consistent with known prostate metastatic cancer. Old RIGHT rib fractures. IMPRESSION: COPD.  No acute cardiopulmonary process. Electronically Signed   By: Elon Alas M.D.   On: 01/19/2017 03:14    Review of Systems  Constitutional: Negative for chills and fever.  HENT: Negative for sore throat and tinnitus.   Eyes: Negative for blurred vision and redness.  Respiratory: Negative for cough and shortness of breath.   Cardiovascular: Negative for chest pain, palpitations, orthopnea and PND.  Gastrointestinal: Negative for abdominal pain, diarrhea, nausea and vomiting.  Genitourinary: Negative for dysuria, frequency and urgency.  Musculoskeletal: Negative for joint pain and myalgias.  Skin: Negative for rash.       No lesions  Neurological: Negative for speech change, focal weakness and weakness.  Endo/Heme/Allergies: Does not bruise/bleed easily.       No temperature intolerance  Psychiatric/Behavioral:  Negative for depression and suicidal ideas.    Blood pressure (!) 194/87, pulse 74, temperature 98 F (36.7 C), temperature source Oral, resp. rate 16, height 5' 10"  (1.778 m), weight 101.6 kg (224 lb), SpO2 96 %. Physical Exam  Vitals reviewed. Constitutional: He is oriented to person, place, and time. He appears well-developed and well-nourished. No distress.  HENT:  Head: Normocephalic and atraumatic.  Mouth/Throat: Oropharynx is clear and moist.  Eyes: Pupils are equal, round, and reactive to light. Conjunctivae and  EOM are normal. Right eye exhibits no discharge. Left eye exhibits no discharge. No scleral icterus.  Neck: Normal range of motion. Neck supple. No JVD present. No tracheal deviation present. No thyromegaly present.  Cardiovascular: Normal rate, regular rhythm and normal heart sounds.  Exam reveals no gallop and no friction rub.   No murmur heard. Respiratory: Effort normal and breath sounds normal. No respiratory distress.  GI: Soft. Bowel sounds are normal. He exhibits no distension. There is no tenderness.  Genitourinary:  Genitourinary Comments: Deferred  Musculoskeletal: Normal range of motion. He exhibits no edema.  Lymphadenopathy:    He has no cervical adenopathy.  Neurological: He is alert and oriented to person, place, and time. No cranial nerve deficit.  Skin: Skin is warm and dry. No rash noted. No erythema.  Psychiatric: He has a normal mood and affect. His behavior is normal. Judgment and thought content normal.     Assessment/Plan This is a 71 year old male admitted for chest pain. 1. Chest pain: Atypical and duration for cardiac chest pain, although some relief with nitroglycerin and aspirin. Nonspecific ST-T wave changes. Continue to monitor telemetry. Follow cardiac biomarkers. Cardiology has been consulted. 2. Hypertension: Uncontrolled; symptoms possibly secondary to hypertensive urgency.we'll add nitro paste to the patient's chest. Continue  clonidine, losartan, metoprolol, HCTZ and hydralazine Labetalol as needed. 3. Coronary artery disease: Continue aspirin 4. Diabetes mellitus type 2: Hold oral hypoglycemic agents. Sliding scale insulin while hospitalized 5.hyperlipidemia: Continue statin therapy 6. COPD: Continue inhaled corticosteroid with long-acting bronchial agonist in addition to steroid pulse.  7. Prostate cancer: Continue Casodex. Also continue finasteride and tamsulosin 8. DVT prophylaxis: Lovenox 9. GI prophylaxis:none The patient is a full code. Time spent on admission orders and patient care approximately 45 minutes  Harrie Foreman, MD 01/19/2017, 5:47 AM

## 2017-01-19 NOTE — Progress Notes (Signed)
*  PRELIMINARY RESULTS* Echocardiogram 2D Echocardiogram has been performed.  Sherrie Sport 01/19/2017, 3:46 PM

## 2017-01-19 NOTE — ED Triage Notes (Signed)
Patient ambulatory to triage with steady gait, without difficulty or distress noted; pt reports U CP since yesterday afternoon radiating into back ; denies any accomp symptoms

## 2017-01-19 NOTE — Care Management (Signed)
Placed in observation for chest pain.  First troponin slightly elevated. Cardiology consult pending.  Independent in all adls, denies issues accessing medical care, obtaining medications or with transportation. Is followed and obtains meds through [facility]. Friend assists with transportation. Current with PCP.  No discharge needs identified at present by care manager or members of care team

## 2017-01-19 NOTE — Progress Notes (Signed)
[facility] [place] NAME: Malcomb Gangemi    MR#:  [id number]  DATE OF BIRTH:  1946/01/03  SUBJECTIVE:  CHIEF COMPLAINT:   Chief Complaint  Patient presents with  . Chest Pain   - admitted with elevated blood pressure and chest pain. Chest pain has resolved at this time. Blood pressure is better than yesterday. -Complains of significant weakness and tiredness all the time.  REVIEW OF SYSTEMS:  Review of Systems  Constitutional: Positive for malaise/fatigue. Negative for chills and fever.  HENT: Negative for congestion, ear discharge, hearing loss, nosebleeds and sinus pain.   Eyes: Negative for blurred vision and double vision.  Respiratory: Negative for cough, shortness of breath and wheezing.   Cardiovascular: Negative for chest pain, palpitations and leg swelling.  Gastrointestinal: Negative for abdominal pain, constipation, diarrhea, nausea and vomiting.  Genitourinary: Negative for dysuria and urgency.  Musculoskeletal: Negative for myalgias.  Neurological: Positive for weakness. Negative for dizziness, speech change, focal weakness, seizures and headaches.  Psychiatric/Behavioral: Negative for depression.    DRUG ALLERGIES:  No Known Allergies  VITALS:  Blood pressure (!) 165/85, pulse 86, temperature 98.1 F (36.7 C), temperature source Oral, resp. rate 18, height 5\' 10"  (1.778 m), weight 101.6 kg (224 lb), SpO2 95 %.  PHYSICAL EXAMINATION:  Physical Exam  GENERAL:  71 y.o.-year-old patient lying in the bed with no acute distress.  EYES: Pupils equal, round, reactive to light and accommodation. No scleral icterus. Extraocular muscles intact.  HEENT: Head atraumatic, normocephalic. Oropharynx and nasopharynx clear.  NECK:  Supple, no jugular venous distention. No thyroid enlargement, no tenderness.  LUNGS: Normal breath sounds bilaterally, no wheezing, rales,rhonchi or crepitation. No use of accessory muscles of respiration.    CARDIOVASCULAR: S1, S2 normal. No murmurs, rubs, or gallops.  ABDOMEN: Soft, nontender, nondistended. Bowel sounds present. No organomegaly or mass.  EXTREMITIES: No pedal edema, cyanosis, or clubbing.  NEUROLOGIC: Cranial nerves II through XII are intact. Muscle strength 5/5 in all extremities. Sensation intact. Gait not checked.  PSYCHIATRIC: The patient is alert and oriented x 3.  SKIN: No obvious rash, lesion, or ulcer.    LABORATORY PANEL:   CBC  Recent Labs Lab 01/19/17 0247  WBC 8.8  HGB 11.1*  HCT 33.3*  PLT 201   ------------------------------------------------------------------------------------------------------------------  Chemistries   Recent Labs Lab 01/19/17 0453  NA 142  K 3.9  CL 102  CO2 30  GLUCOSE 118*  BUN 29*  CREATININE 0.88  CALCIUM 8.7*  AST 32  ALT 16*  ALKPHOS 813*  BILITOT 0.6   ------------------------------------------------------------------------------------------------------------------  Cardiac Enzymes  Recent Labs Lab 01/19/17 0905  TROPONINI <0.03   ------------------------------------------------------------------------------------------------------------------  RADIOLOGY:  Dg Chest 2 View  Result Date: 01/19/2017 CLINICAL DATA:  LEFT upper chest pain beginning yesterday afternoon. History of COPD, prostate cancer. EXAM: CHEST  2 VIEW COMPARISON:  Chest radiograph January 04, 2017 and CT chest January 05, 2017 FINDINGS: Cardiomediastinal silhouette is normal. No pleural effusions or focal consolidations. Nipple shadows projecting in lung bases. Increased lung volumes with flattened hemidiaphragms. Trachea projects midline and there is no pneumothorax. Re- demonstration diffusely sclerotic axial skeleton consistent with known prostate metastatic cancer. Old RIGHT rib fractures. IMPRESSION: COPD.  No acute cardiopulmonary process. Electronically Signed   By: Elon Alas M.D.   On: 01/19/2017 03:14    EKG:   Orders placed  or performed during the hospital encounter of 01/19/17  . ED EKG within 10 minutes  .  ED EKG within 10 minutes    ASSESSMENT AND PLAN:    71 year old male with past medical history significant for COPD, prostate cancer, asthma and chronic cough, hypertension and sleep apnea presents to the hospital secondary togeneralized weakness and also chest pain.  #1 chest pain-admitted for possible unstable angina. No chest pain at this time. -Stress test as outpatient negative in the past. Troponins 2 are negative so far. -Awaiting cardiology consult. Echocardiogram ordered for today. -continue telemetry monitoring. -on aspirin, statin. Stress test either as inpatient or outpatient -was in the emergency room for chest pain about 2 weeks ago at which time had a CT of his chest abdomen and pelvis that did not show any pulmonary embolism, aneurysm. Has diffuse bony metastatic status is sent a 1 cm nodule in left upper lobe of the lung which is being observed by oncology.  #2 hypertensive urgency-could've caused his angina. Blood pressure is improving today. Restart home medications. -Patient on clonidine, hydralazine, hydrochlorothiazide, losartan and metoprolol  #3 extreme fatigue and weakness- worsening underlying prostate cancer likely is the cause. -continue Flomax and Proscar. 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 -repeat bone scan in July 2018 showing significantly worsening nausea is metastatic disease. - On Lupron injections - also started on prednisone and zytiga recently about a week ago  #5 DVT prophylaxis-Lovenox   All the records are reviewed and case discussed with Care Management/Social Workerr. Management plans discussed with the patient,  family and they are in agreement.  CODE STATUS: full code  TOTAL TIME TAKING CARE OF THIS PATIENT: 38 minutes.   POSSIBLE D/C today or tomorrow, DEPENDING ON CLINICAL CONDITION.   Brody Kump M.D on 01/19/2017 at 1:18 PM  Between 7am to 6pm - Pager - [phone]  After 6pm go to [email or web address] - password [email or web address] [facility]  Office  [phone]  CC: Primary care physician; [name], MD

## 2017-01-21 NOTE — Discharge Summary (Signed)
[facility] [place] [facility] NAME: Michael Mathews    MR#:  [id number]  DATE OF BIRTH:  1946-06-09  DATE OF ADMISSION:  01/19/2017   ADMITTING PHYSICIAN: Harrie Foreman, MD  DATE OF DISCHARGE: 01/19/2017  4:49 PM  PRIMARY CARE PHYSICIAN: [name], MD   ADMISSION DIAGNOSIS:   Unstable angina ([place]) [I20.0] Essential hypertension [I10] Chest pain, unspecified type [R07.9]  DISCHARGE DIAGNOSIS:   Active Problems:   Chest pain   SECONDARY DIAGNOSIS:   Past Medical History:  Diagnosis Date  . Arthritis   . Asthma   . Cancer [facility])    prostate  . COPD (chronic obstructive pulmonary disease) ([place])   . Coronary artery disease   . Cough    chronic  . Diabetes mellitus without complication ([place])   . Edema   . Elevated PSA   . Headache   . Hypertension   . Orthopnea   . Oxygen deficit    o2 prn  . Shortness of breath dyspnea   . Sleep apnea    cpap    HOSPITAL COURSE:   71 year old male with past medical history significant for COPD, prostate cancer, asthma and chronic cough, hypertension and sleep apnea presents to the hospital secondary togeneralized weakness and also chest pain.  #1 chest pain-admitted for possible unstable angina. No chest pain at this time. -Stress test as outpatient negative in the past. Troponins 3 are negative so far. -appreciate cardiology consult. Echocardiogram with LVH and diastolic dysfunction, EF of 60-65%. No new wall motion abnormalities noted -Was on telemetry monitoring. -on aspirin, statin. -Patient very anxious to be discharged. Discussed with cardiologist and they have decided to do stress test as outpatient next week due to low risk. -was in the emergency room for chest pain about 2 weeks ago at which time had a CT of his chest abdomen and pelvis that did not show any pulmonary embolism, aneurysm. Has diffuse bony metastases and a 1 cm nodule in left upper lobe of the lung which is being  observed by oncology.  #2 hypertensive urgency-could've caused his angina. Blood pressure is improved. Restart home medications. Advised compliance -Patient on clonidine, hydralazine, hydrochlorothiazide, losartan and metoprolol  #3 extreme fatigue and weakness- worsening underlying prostate cancer likely is the cause. -continue Flomax and Proscar. 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 -repeat bone scan in July 2018 showing significantly worsening metastatic disease. - On Lupron injections - also started on prednisone and zytiga recently about a week ago Outpatient follow-up recommended.  Patient felt better and was very anxious to leave. So being discharged home  DISCHARGE CONDITIONS:   Guarded  CONSULTS OBTAINED:   Treatment Team:  Teodoro Spray, MD  DRUG ALLERGIES:   No Known Allergies DISCHARGE MEDICATIONS:   Allergies as of 01/19/2017   No Known Allergies     Medication List    STOP taking these medications   GLIPIZIDE XL 10 MG 24 hr tablet Generic drug:  glipiZIDE   oxyCODONE 5 MG immediate release tablet Commonly known as:  Oxy IR/ROXICODONE     TAKE these medications   abiraterone Acetate 250 MG tablet Commonly known as:  ZYTIGA Take 4 tablets (1,000 mg total) by mouth daily. Take on an empty stomach 1 hour before or  2 hours after a meal   aspirin EC 325 MG tablet Take 325 mg by mouth every morning.   atorvastatin 40 MG tablet Commonly known as:  LIPITOR Take 1 tablet (40 mg total) by mouth daily.   bicalutamide 50 MG tablet Commonly known as:  CASODEX Take 1 tablet (50 mg total) by mouth daily.   cloNIDine 0.2 MG tablet Commonly known as:  CATAPRES Take 1 tablet (0.2 mg total) by mouth 2 (two) times  daily. What changed:  medication strength  how much to take  when to take this   DULoxetine 30 MG capsule Commonly known as:  CYMBALTA Take 30 mg by mouth daily.   ferrous sulfate 325 (65 FE) MG EC tablet Take 1 tablet (325 mg total) by mouth 2 (two) times daily with a meal.   finasteride 5 MG tablet Commonly known as:  PROSCAR Take 5 mg by mouth daily.   Fluticasone-Salmeterol 250-50 MCG/DOSE Aepb Commonly known as:  ADVAIR Inhale 1 puff into the lungs 2 (two) times daily.   hydrALAZINE 100 MG tablet Commonly known as:  APRESOLINE Take 100 mg by mouth 3 (three) times daily.   hydrochlorothiazide 25 MG tablet Commonly known as:  HYDRODIURIL Take 25 mg by mouth daily.   losartan 100 MG tablet Commonly known as:  COZAAR Take 100 mg by mouth daily.   metFORMIN 500 MG tablet Commonly known as:  GLUCOPHAGE Take 500 mg by mouth 2 (two) times daily with a meal.   metoprolol tartrate 50 MG tablet Commonly known as:  LOPRESSOR Take 50 mg by mouth 2 (two) times daily.   oxyCODONE-acetaminophen 5-325 MG tablet Commonly known as:  ROXICET Take 1 tablet by mouth every 4 (four) hours as needed for severe pain.   predniSONE 5 MG tablet Commonly known as:  DELTASONE Take 1 tablet (5 mg total) by mouth 2 (two) times daily with a meal.   tamsulosin 0.4 MG Caps capsule Commonly known as:  FLOMAX Take 1 capsule (0.4 mg total) by mouth daily.   vitamin B-12 1000 MCG tablet Commonly known as:  CYANOCOBALAMIN Take 1 tablet (1,000 mcg total) by mouth daily.        DISCHARGE INSTRUCTIONS:   1. PCP follow-up in 1-2 weeks 2. Cardiology follow up in 3 days for outpatient stress test  DIET:   Cardiac diet  ACTIVITY:   Activity as tolerated  OXYGEN:   Home Oxygen: No.  Oxygen Delivery: room air  DISCHARGE LOCATION:   home   If you experience worsening of your admission symptoms, develop shortness of breath, life threatening emergency, suicidal or homicidal  thoughts you must seek medical attention immediately by calling 911 or calling your MD immediately  if symptoms less severe.  You Must read complete instructions/literature along with all the possible adverse reactions/side effects for all the Medicines you take and that have been prescribed to you. Take any new Medicines after you have completely understood and accpet all the possible adverse reactions/side effects.   Please note  You were cared for by a hospitalist during your hospital stay. If you have any questions about your discharge medications or the care you received while you were in the hospital after you are discharged, you can call the unit and asked to speak with the hospitalist on call if the hospitalist that took care of you is not available. Once you are discharged, your primary care physician will handle any further medical issues. Please note that NO REFILLS for any discharge  medications will be authorized once you are discharged, as it is imperative that you return to your primary care physician (or establish a relationship with a primary care physician if you do not have one) for your aftercare needs so that they can reassess your need for medications and monitor your lab values.    On the day of Discharge:  VITAL SIGNS:   Blood pressure (!) 165/85, pulse 86, temperature 98.1 F (36.7 C), temperature source Oral, resp. rate 18, height 5\' 10"  (1.778 m), weight 101.6 kg (224 lb), SpO2 95 %.  PHYSICAL EXAMINATION:    GENERAL:  71 y.o.-year-old patient lying in the bed with no acute distress.  EYES: Pupils equal, round, reactive to light and accommodation. No scleral icterus. Extraocular muscles intact.  HEENT: Head atraumatic, normocephalic. Oropharynx and nasopharynx clear.  NECK:  Supple, no jugular venous distention. No thyroid enlargement, no tenderness.  LUNGS: Normal breath sounds bilaterally, no wheezing, rales,rhonchi or crepitation. No use of accessory muscles of  respiration.  CARDIOVASCULAR: S1, S2 normal. No murmurs, rubs, or gallops.  ABDOMEN: Soft, nontender, nondistended. Bowel sounds present. No organomegaly or mass.  EXTREMITIES: No pedal edema, cyanosis, or clubbing.  NEUROLOGIC: Cranial nerves II through XII are intact. Muscle strength 5/5 in all extremities. Sensation intact. Gait not checked.  PSYCHIATRIC: The patient is alert and oriented x 3.  SKIN: No obvious rash, lesion, or ulcer.   DATA REVIEW:   CBC  Recent Labs Lab 01/19/17 0247  WBC 8.8  HGB 11.1*  HCT 33.3*  PLT 201    Chemistries   Recent Labs Lab 01/19/17 0453  NA 142  K 3.9  CL 102  CO2 30  GLUCOSE 118*  BUN 29*  CREATININE 0.88  CALCIUM 8.7*  AST 32  ALT 16*  ALKPHOS 813*  BILITOT 0.6     Microbiology Results  Results for orders placed or performed during the hospital encounter of 10/12/16  Urine culture     Status: None   Collection Time: 10/12/16 10:39 AM  Result Value Ref Range Status   Specimen Description URINE, CLEAN CATCH  Final   Special Requests NONE  Final   Culture   Final    NO GROWTH Performed at [facility], [place] [address] [place], [place] [address]    Report Status 10/13/2016 FINAL  Final    RADIOLOGY:  No results found.   Management plans discussed with the patient, family and they are in agreement.  CODE STATUS:  Code Status History    Date Active Date Inactive Code Status Order ID Comments User Context   01/19/2017  6:15 AM 01/19/2017  7:54 PM Full Code [id number]  Harrie Foreman, MD ED   10/18/2016  1:40 PM 10/19/2016  4:48 PM Full Code [id number]  Hollice Espy, MD Inpatient   03/23/2016  8:17 PM 03/24/2016  3:01 PM DNR [id number]  Loletha Grayer, MD ED   03/11/2016  1:41 PM 03/14/2016  4:06 PM DNR [id number]  Bettey Costa, MD ED   03/03/2016  3:36 PM 03/04/2016  7:06 AM DNR [id number]  Bettey Costa, MD Inpatient    Advance Directive Documentation     Most Recent Value  Type of Advance Directive  Living will   Pre-existing out of facility DNR order (yellow form or pink MOST form)  -  "MOST" Form in Place?  -      TOTAL TIME TAKING CARE OF THIS PATIENT: 37 minutes.    Gladstone Lighter M.D on 01/21/2017 at 4:10  PM  Between 7am to 6pm - Pager - [phone]  After 6pm go to [email or web address] - [employer] [facility]  Office  [phone]  CC: Primary care physician; [name], MD   Note: This dictation was prepared with Dragon dictation along with smaller phrase technology. Any transcriptional errors that result from this process are unintentional.

## 2017-01-26 ENCOUNTER — Inpatient Hospital Stay: Payer: Medicare Other

## 2017-01-26 DIAGNOSIS — C61 Malignant neoplasm of prostate: Secondary | ICD-10-CM

## 2017-01-26 DIAGNOSIS — C7951 Secondary malignant neoplasm of bone: Principal | ICD-10-CM

## 2017-01-26 LAB — COMPREHENSIVE METABOLIC PANEL
ALT: 17 U/L (ref 17–63)
AST: 21 U/L (ref 15–41)
Albumin: 3.4 g/dL — ABNORMAL LOW (ref 3.5–5.0)
Alkaline Phosphatase: 618 U/L — ABNORMAL HIGH (ref 38–126)
Anion gap: 10 (ref 5–15)
BUN: 20 mg/dL (ref 6–20)
CHLORIDE: 104 mmol/L (ref 101–111)
CO2: 26 mmol/L (ref 22–32)
Calcium: 8.7 mg/dL — ABNORMAL LOW (ref 8.9–10.3)
Creatinine, Ser: 0.85 mg/dL (ref 0.61–1.24)
Glucose, Bld: 135 mg/dL — ABNORMAL HIGH (ref 65–99)
POTASSIUM: 4.3 mmol/L (ref 3.5–5.1)
Sodium: 140 mmol/L (ref 135–145)
Total Bilirubin: 0.6 mg/dL (ref 0.3–1.2)
Total Protein: 7.1 g/dL (ref 6.5–8.1)

## 2017-02-02 ENCOUNTER — Telehealth: Payer: Self-pay | Admitting: *Deleted

## 2017-02-02 NOTE — Telephone Encounter (Signed)
Company can't get in touch with him to deliver medicine. I told them that he has a terrible phone and it is difficult to get him on phone.  I asked if they could deliver med to out cancer center and if need me I will deliver med to pt. I had to give a list of drugs patient is on and they will ship the drug on Monday and it will be delivered by end of business day.

## 2017-02-08 ENCOUNTER — Telehealth: Payer: Self-pay | Admitting: *Deleted

## 2017-02-08 NOTE — Telephone Encounter (Signed)
Got a call from [facility] that they are unable to reach pt to send his zytiga to him.  I explained that he has terrible phone and we have issues also. I asked that they send med to our office.  I rcvd it tues and drove med to pt's house and made sure he and I read the bottle to make sure name and date of birth matched and the pills was zytiga 250 mg and to take 4 tablets daily every day 1 hour before eating.  Pt will get in touch with md that took his cyst off his back because they told him they wanted to know when he was going to start the med.  He only had 2 more days of atb and would start the med by Friday.  He is to get back in touch with me to know the exact start date.  He also already came to the oupt oral chemo class and understands the side effects.  He also has an appt 2 weeks from now to  get labs and see md.  The appt schedule I took to him also,

## 2017-02-09 ENCOUNTER — Inpatient Hospital Stay: Payer: Medicare Other

## 2017-02-09 ENCOUNTER — Inpatient Hospital Stay: Payer: Medicare Other | Admitting: Oncology

## 2017-02-13 ENCOUNTER — Inpatient Hospital Stay: Payer: Medicare Other | Attending: Oncology

## 2017-02-13 DIAGNOSIS — R972 Elevated prostate specific antigen [PSA]: Secondary | ICD-10-CM | POA: Insufficient documentation

## 2017-02-13 DIAGNOSIS — R232 Flushing: Secondary | ICD-10-CM | POA: Diagnosis not present

## 2017-02-13 DIAGNOSIS — G4730 Sleep apnea, unspecified: Secondary | ICD-10-CM | POA: Insufficient documentation

## 2017-02-13 DIAGNOSIS — R0601 Orthopnea: Secondary | ICD-10-CM | POA: Diagnosis not present

## 2017-02-13 DIAGNOSIS — Z79899 Other long term (current) drug therapy: Secondary | ICD-10-CM | POA: Diagnosis not present

## 2017-02-13 DIAGNOSIS — C61 Malignant neoplasm of prostate: Secondary | ICD-10-CM

## 2017-02-13 DIAGNOSIS — Z7982 Long term (current) use of aspirin: Secondary | ICD-10-CM | POA: Insufficient documentation

## 2017-02-13 DIAGNOSIS — D638 Anemia in other chronic diseases classified elsewhere: Secondary | ICD-10-CM | POA: Insufficient documentation

## 2017-02-13 DIAGNOSIS — K4020 Bilateral inguinal hernia, without obstruction or gangrene, not specified as recurrent: Secondary | ICD-10-CM | POA: Diagnosis not present

## 2017-02-13 DIAGNOSIS — I10 Essential (primary) hypertension: Secondary | ICD-10-CM | POA: Diagnosis not present

## 2017-02-13 DIAGNOSIS — E119 Type 2 diabetes mellitus without complications: Secondary | ICD-10-CM | POA: Diagnosis not present

## 2017-02-13 DIAGNOSIS — C7951 Secondary malignant neoplasm of bone: Secondary | ICD-10-CM | POA: Diagnosis not present

## 2017-02-13 DIAGNOSIS — R911 Solitary pulmonary nodule: Secondary | ICD-10-CM | POA: Diagnosis not present

## 2017-02-13 DIAGNOSIS — J449 Chronic obstructive pulmonary disease, unspecified: Secondary | ICD-10-CM | POA: Insufficient documentation

## 2017-02-13 DIAGNOSIS — R531 Weakness: Secondary | ICD-10-CM | POA: Insufficient documentation

## 2017-02-13 DIAGNOSIS — I712 Thoracic aortic aneurysm, without rupture: Secondary | ICD-10-CM | POA: Diagnosis not present

## 2017-02-13 DIAGNOSIS — F17210 Nicotine dependence, cigarettes, uncomplicated: Secondary | ICD-10-CM | POA: Diagnosis not present

## 2017-02-13 DIAGNOSIS — Z79818 Long term (current) use of other agents affecting estrogen receptors and estrogen levels: Secondary | ICD-10-CM | POA: Diagnosis not present

## 2017-02-13 DIAGNOSIS — I2510 Atherosclerotic heart disease of native coronary artery without angina pectoris: Secondary | ICD-10-CM | POA: Diagnosis not present

## 2017-02-13 MED ORDER — LEUPROLIDE ACETATE (3 MONTH) 22.5 MG IM KIT
22.5000 mg | PACK | Freq: Once | INTRAMUSCULAR | Status: AC
  Administered 2017-02-13: 22.5 mg via INTRAMUSCULAR
  Filled 2017-02-13: qty 22.5

## 2017-02-20 ENCOUNTER — Telehealth: Payer: Self-pay | Admitting: Oncology

## 2017-02-20 NOTE — Telephone Encounter (Signed)
Rescheduled per Dr [name]. L/M on V/M. Also mailed appt. 02/20/17 MF

## 2017-02-22 ENCOUNTER — Inpatient Hospital Stay: Payer: Medicare Other | Admitting: Oncology

## 2017-02-22 ENCOUNTER — Inpatient Hospital Stay: Payer: Medicare Other

## 2017-02-23 ENCOUNTER — Telehealth: Payer: Self-pay | Admitting: Pharmacist

## 2017-02-23 NOTE — Telephone Encounter (Signed)
Oral Chemotherapy Pharmacist Encounter  Patient showed up at office today looking for his medication. He stated last time he received medication from [facility] he only received a 15 day supply. Called [facility] [phone]) to figure out what was going on, they stated that for the first fill they only send out a 15 day supply, contact the patient for a toxicity check then send the last of the 30 days supply.   Initiated fill with the pharmacy while on phone. They will ship out medication today and it will be delivered to patient, weather permitting, tomorrow 9/15. Informed patient of this information.   With patient not having a phone using the [facility] specialty pharmacy may be problematic.   Thank you,  Darl Pikes, PharmD, BCPS Hematology/Oncology Clinical Pharmacist [facility] [phone]  02/23/2017 10:57 AM

## 2017-02-26 NOTE — Telephone Encounter (Signed)
I need to see him whenever he can show up. Check cbc, cmp that day

## 2017-02-27 ENCOUNTER — Encounter: Payer: Self-pay | Admitting: Oncology

## 2017-02-27 ENCOUNTER — Telehealth: Payer: Self-pay | Admitting: *Deleted

## 2017-02-27 ENCOUNTER — Inpatient Hospital Stay (HOSPITAL_BASED_OUTPATIENT_CLINIC_OR_DEPARTMENT_OTHER): Payer: Medicare Other | Admitting: Oncology

## 2017-02-27 ENCOUNTER — Inpatient Hospital Stay: Payer: Medicare Other

## 2017-02-27 VITALS — BP 176/77 | HR 66 | Temp 97.6°F | Resp 22 | Ht 70.0 in | Wt 231.9 lb

## 2017-02-27 VITALS — BP 183/83

## 2017-02-27 DIAGNOSIS — C61 Malignant neoplasm of prostate: Secondary | ICD-10-CM

## 2017-02-27 DIAGNOSIS — K4020 Bilateral inguinal hernia, without obstruction or gangrene, not specified as recurrent: Secondary | ICD-10-CM | POA: Diagnosis not present

## 2017-02-27 DIAGNOSIS — I10 Essential (primary) hypertension: Secondary | ICD-10-CM

## 2017-02-27 DIAGNOSIS — D638 Anemia in other chronic diseases classified elsewhere: Secondary | ICD-10-CM | POA: Diagnosis not present

## 2017-02-27 DIAGNOSIS — Z79818 Long term (current) use of other agents affecting estrogen receptors and estrogen levels: Secondary | ICD-10-CM | POA: Diagnosis not present

## 2017-02-27 DIAGNOSIS — R232 Flushing: Secondary | ICD-10-CM

## 2017-02-27 DIAGNOSIS — C7951 Secondary malignant neoplasm of bone: Principal | ICD-10-CM

## 2017-02-27 DIAGNOSIS — R911 Solitary pulmonary nodule: Secondary | ICD-10-CM

## 2017-02-27 DIAGNOSIS — F17210 Nicotine dependence, cigarettes, uncomplicated: Secondary | ICD-10-CM

## 2017-02-27 DIAGNOSIS — I2510 Atherosclerotic heart disease of native coronary artery without angina pectoris: Secondary | ICD-10-CM | POA: Diagnosis not present

## 2017-02-27 DIAGNOSIS — G4730 Sleep apnea, unspecified: Secondary | ICD-10-CM

## 2017-02-27 DIAGNOSIS — I712 Thoracic aortic aneurysm, without rupture: Secondary | ICD-10-CM | POA: Diagnosis not present

## 2017-02-27 DIAGNOSIS — R531 Weakness: Secondary | ICD-10-CM | POA: Diagnosis not present

## 2017-02-27 DIAGNOSIS — Z7982 Long term (current) use of aspirin: Secondary | ICD-10-CM

## 2017-02-27 DIAGNOSIS — R0601 Orthopnea: Secondary | ICD-10-CM

## 2017-02-27 DIAGNOSIS — E119 Type 2 diabetes mellitus without complications: Secondary | ICD-10-CM

## 2017-02-27 DIAGNOSIS — J449 Chronic obstructive pulmonary disease, unspecified: Secondary | ICD-10-CM

## 2017-02-27 DIAGNOSIS — Z79899 Other long term (current) drug therapy: Secondary | ICD-10-CM

## 2017-02-27 DIAGNOSIS — R972 Elevated prostate specific antigen [PSA]: Secondary | ICD-10-CM

## 2017-02-27 LAB — CBC WITH DIFFERENTIAL/PLATELET
BASOS ABS: 0.2 10*3/uL — AB (ref 0–0.1)
BASOS PCT: 2 %
EOS ABS: 0.1 10*3/uL (ref 0–0.7)
Eosinophils Relative: 1 %
HEMATOCRIT: 34.6 % — AB (ref 40.0–52.0)
Hemoglobin: 11.8 g/dL — ABNORMAL LOW (ref 13.0–18.0)
Lymphocytes Relative: 14 %
Lymphs Abs: 1.3 10*3/uL (ref 1.0–3.6)
MCH: 32.8 pg (ref 26.0–34.0)
MCHC: 34.1 g/dL (ref 32.0–36.0)
MCV: 96.3 fL (ref 80.0–100.0)
MONO ABS: 0.8 10*3/uL (ref 0.2–1.0)
MONOS PCT: 9 %
NEUTROS ABS: 7.1 10*3/uL — AB (ref 1.4–6.5)
Neutrophils Relative %: 74 %
PLATELETS: 188 10*3/uL (ref 150–440)
RBC: 3.59 MIL/uL — ABNORMAL LOW (ref 4.40–5.90)
RDW: 15.3 % — AB (ref 11.5–14.5)
WBC: 9.5 10*3/uL (ref 3.8–10.6)

## 2017-02-27 LAB — COMPREHENSIVE METABOLIC PANEL
ALBUMIN: 3.7 g/dL (ref 3.5–5.0)
ALT: 20 U/L (ref 17–63)
ANION GAP: 13 (ref 5–15)
AST: 26 U/L (ref 15–41)
Alkaline Phosphatase: 786 U/L — ABNORMAL HIGH (ref 38–126)
BUN: 23 mg/dL — AB (ref 6–20)
CHLORIDE: 101 mmol/L (ref 101–111)
CO2: 25 mmol/L (ref 22–32)
Calcium: 8.8 mg/dL — ABNORMAL LOW (ref 8.9–10.3)
Creatinine, Ser: 0.98 mg/dL (ref 0.61–1.24)
GFR calc Af Amer: >60 mL/min (ref >60)
GFR calc non Af Amer: >60 mL/min (ref >60)
GLUCOSE: 121 mg/dL — AB (ref 65–99)
POTASSIUM: 3.5 mmol/L (ref 3.5–5.1)
SODIUM: 139 mmol/L (ref 135–145)
TOTAL PROTEIN: 7.1 g/dL (ref 6.5–8.1)
Total Bilirubin: 0.8 mg/dL (ref 0.3–1.2)

## 2017-02-27 LAB — IRON AND TIBC
Iron: 124 ug/dL (ref 45–182)
SATURATION RATIOS: 34 % (ref 17.9–39.5)
TIBC: 363 ug/dL (ref 250–450)
UIBC: 239 ug/dL

## 2017-02-27 LAB — FERRITIN: Ferritin: 51 ng/mL (ref 24–336)

## 2017-02-27 LAB — PSA: PROSTATIC SPECIFIC ANTIGEN: 1.07 ng/mL (ref 0.00–4.00)

## 2017-02-27 MED ORDER — AMLODIPINE BESYLATE 5 MG PO TABS
5.0000 mg | ORAL_TABLET | Freq: Once | ORAL | Status: AC
  Administered 2017-02-27: 5 mg via ORAL

## 2017-02-27 NOTE — Progress Notes (Signed)
Hematology/Oncology Consult note [facility]  [phone] [phone]  Patient Care Team: [name], MD as PCP - General (Family Medicine)   Name of the patient: Michael Mathews  [id number]  1945-07-08   Date of visit: 03/01/17  Diagnosis- metastatic castrate sensitive prostate cancer   Chief complaint/ Reason for visit- discuss biopsy results  Heme/Onc history:1. patient is 71 year old male with a past medical history significant for hypertension diabetes and COPD among other medical problems. He was admitted to the hospital in October 2017 with some symptoms of dizziness and abdominal pain. CT angiogram abdomen incidentally showed sclerotic metastatic disease throughout the visualized spine.   2. This was followed by an MRI of the lumbar spine which showed multifocal T1 and T2 hypointense lesions with areas of increased signal on STIR sequence are seen in multiple vertebral bodies. Largest lesions are in T11 T12 L1 and in the imaged sacrum. This is consistent with metastatic disease. Primary lesion is not identified. MRI brain showed no acute intracranial abnormality.   3. Patient was noted to have an elevated PSA and was referred to urology as an outpatient. He was recently seen by Dr. Tresa Moore on 05/29/2016. PSA was repeated at that time which came back elevated at 10.3. 3 months over the value was 10.81.  4. Bone scan on 07/07/16 showed : Widespread radiotracer uptake over the axial and appendicular skeleton as described compatible with metastatic disease and correlating with sclerotic lesions on Ct. CT chest on 07/07/16 showed: Scattered sclerotic osseous metastatic lesions throughout spine, pelvis, proximal femora, ribs, and manubrium. Prostatic enlargement with thickened bladder wall question related to chronic bladder outlet obstruction.  Spiculated 12 x 11 x 7 mm LEFT upper lobe nodule question primary pulmonary neoplasm. Stable  nonspecific small LEFT adrenal nodule. Single minimally enlarged AP window lymph node. BILATERAL inguinal hernias containing fat. 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 Recommend annual imaging followup by CTA or MRA.   5. Anemia work up from 06/20/16 was as follows: CBC showed white count of 5.2, H&H of 8.1/25 and a platelet count of 164. CMP showed elevated alkaline phosphatase of 391. Multiple myeloma panel showed normal quantitative immunoglobulins and no monoclonal M protein.Vitamin B12 level was low normal at 225. LDH was mildly elevated at 234. Ferritin was low normal at 27. Reticulocyte count was 2.4% inappropriately low for the degree of anemia. Haptoglobin was elevated at 273.  6. Patients case was discussed at tumor board and plan was to watch the lung nodule with scans as it was in a difficult area to biopsy. CT guided bone biopsy was obtained which showed metastatic prostate cancer for which he is on lupron  7. Given that he does not have visceral or lymph node metastasis, docetaxel/ zytigawas not started. Also patient lives alone and does not have a good social support or means of communication. Those options to be considered at progression.   8. Bone scan on 12/22/2016 showed significantly worsening osseous metastatic disease. Left upper lobe nodule was slightly smaller in size. Zytiga was added in setting of worsening bone disease along with prednisone. Lupron administered 02/13/2017.   Interval history- Was admitted to the hospital 01/19/17 for chest pain.Previously admitted for same and had CT PE that did not show PE or aneurysm. Stress tests previously have been negative. Troponins were negative. Echo showed LVH and diastolic dysfunction with EF of 60-65%. His blood pressure was  elevated but improved when medications were re-started. Patient not able to verbalize which BP meds  he takes regularly.   He continues to have fatigue and weakness. Continues to smoke. Continues to have episodes of elevated blood pressure but says it's 'around 140' at home. Denies swelling, abdominal pain, edema. Previously he had reported lower extremity edema but says it has resolved today. Endorses hot flashes with sweating which he reports as 'annoying' but mild. Patient says he did get a phone but the phone does not have service at his house. He has not gotten a new telephone.   ECOG PS- 2 Pain scale- 0   Review of systems- Review of Systems  Constitutional: Positive for malaise/fatigue. Negative for chills, fever and weight loss.  HENT: Negative for congestion, ear discharge and nosebleeds.   Eyes: Negative for blurred vision.  Respiratory: Negative for cough, hemoptysis, sputum production, shortness of breath and wheezing.   Cardiovascular: Negative for chest pain, palpitations, orthopnea, claudication and leg swelling.  Gastrointestinal: Negative for abdominal pain, blood in stool, constipation, diarrhea, heartburn, melena, nausea and vomiting.  Genitourinary: Negative for dysuria, flank pain, frequency, hematuria and urgency.  Musculoskeletal: Negative for back pain, joint pain and myalgias.  Skin: Negative for rash.  Neurological: Positive for weakness. Negative for dizziness, tingling, focal weakness, seizures and headaches.  Endo/Heme/Allergies: Does not bruise/bleed easily.  Psychiatric/Behavioral: Negative for depression and suicidal ideas. The patient does not have insomnia.        Tobacco use    No Known Allergies  Past Medical History:  Diagnosis Date  . Arthritis   . Asthma   . Cancer [facility])    prostate  . COPD (chronic obstructive pulmonary disease) ([place])   . Coronary artery disease   . Cough    chronic  . Diabetes mellitus without complication ([place])   . Edema   . Elevated PSA   . Headache   . Hypertension   . Orthopnea   . Oxygen deficit    o2 prn  .  Shortness of breath dyspnea   . Sleep apnea    cpap   Past Surgical History:  Procedure Laterality Date  . CARDIAC CATHETERIZATION  2014  . COLONOSCOPY    . CYSTOSCOPY W/ RETROGRADES Bilateral 10/18/2016   Procedure: CYSTOSCOPY WITH RETROGRADE PYELOGRAM;  Surgeon: Hollice Espy, MD;  Location: [facility] ORS;  Service: Urology;  Laterality: Bilateral;  . EYE SURGERY    . KNEE ARTHROSCOPY    . TRANSURETHRAL RESECTION OF BLADDER TUMOR N/A 10/18/2016   Procedure: TRANSURETHRAL RESECTION OF BLADDER TUMOR (TURBT);  Surgeon: Hollice Espy, MD;  Location: [facility] ORS;  Service: Urology;  Laterality: N/A;  . TRANSURETHRAL RESECTION OF PROSTATE N/A 10/18/2016   Procedure: TRANSURETHRAL RESECTION OF THE PROSTATE (TURP) CHANNEL TURP;  Surgeon: Hollice Espy, MD;  Location: [facility] ORS;  Service: Urology;  Laterality: N/A;   Social History   Social History  . Marital status: Single    Spouse name: N/A  . Number of children: N/A  . Years of education: N/A   Occupational History  . Not on file.   Social History Main Topics  . Smoking status: Current Every Day Smoker    Packs/day: 0.50    Years: 55.00  . Smokeless tobacco: Never Used  . Alcohol use No  . Drug use: No  . Sexual activity: Not on file   Other Topics Concern  . Not on file   Social History Narrative  . No narrative on file   Family History  Problem Relation Age of Onset  . CVA Mother   . CAD Father     Current Outpatient Prescriptions:  .  abiraterone Acetate (ZYTIGA) 250 MG tablet, Take 4 tablets (1,000 mg total) by mouth daily. Take on an empty stomach 1 hour before or 2 hours after a meal, Disp: 120 tablet, Rfl: 0 .  aspirin EC 325 MG tablet, Take 325 mg by mouth every morning., Disp: , Rfl:  .  atorvastatin (LIPITOR) 40 MG tablet, Take 1 tablet (40 mg total) by mouth daily., Disp: 30 tablet, Rfl: 0 .  cloNIDine (CATAPRES) 0.2 MG tablet, Take 1 tablet (0.2 mg total) by mouth 2 (two) times daily., Disp: 60 tablet, Rfl: 2 .   ferrous sulfate 325 (65 FE) MG EC tablet, Take 1 tablet (325 mg total) by mouth 2 (two) times daily with a meal., Disp: 60 tablet, Rfl: 3 .  finasteride (PROSCAR) 5 MG tablet, Take 5 mg by mouth daily., Disp: , Rfl:  .  Fluticasone-Salmeterol (ADVAIR) 250-50 MCG/DOSE AEPB, Inhale 1 puff into the lungs 2 (two) times daily., Disp: , Rfl:  .  hydrALAZINE (APRESOLINE) 100 MG tablet, Take 100 mg by mouth 3 (three) times daily. , Disp: , Rfl:  .  losartan (COZAAR) 100 MG tablet, Take 100 mg by mouth daily., Disp: , Rfl:  .  metFORMIN (GLUCOPHAGE) 500 MG tablet, Take 500 mg by mouth 2 (two) times daily with a meal., Disp: , Rfl:  .  metoprolol (LOPRESSOR) 50 MG tablet, Take 50 mg by mouth 2 (two) times daily., Disp: , Rfl:  .  predniSONE (DELTASONE) 5 MG tablet, Take 1 tablet (5 mg total) by mouth 2 (two) times daily with a meal., Disp: 60 tablet, Rfl: 0 .  tamsulosin (FLOMAX) 0.4 MG CAPS capsule, Take 1 capsule (0.4 mg total) by mouth daily., Disp: 90 capsule, Rfl: 3 .  vitamin B-12 (CYANOCOBALAMIN) 1000 MCG tablet, Take 1 tablet (1,000 mcg total) by mouth daily., Disp: 30 tablet, Rfl: 3 .  DULoxetine (CYMBALTA) 30 MG capsule, Take 30 mg by mouth daily., Disp: , Rfl:   Physical exam:  Vitals:   02/27/17 1430  BP: (!) 176/77  Pulse: 66  Resp: (!) 22  Temp: 97.6 F (36.4 C)  TempSrc: Tympanic  Weight: 231 lb 14.4 oz (105.2 kg)  Height: 5' 10"  (1.778 m)   Physical Exam  Constitutional: He is oriented to person, place, and time and well-developed, well-nourished, and in no distress.  HENT:  Head: Normocephalic and atraumatic.  Eyes: Pupils are equal, round, and reactive to light. EOM are normal.  Neck: Normal range of motion. No JVD present.  Cardiovascular: Normal rate, regular rhythm and normal heart sounds.   Pulmonary/Chest: Effort normal.  Breath sounds diminished  Abdominal: Soft. Bowel sounds are normal. He exhibits no distension.  Musculoskeletal: He exhibits no edema.    Lymphadenopathy:    He has no cervical adenopathy.  Neurological: He is alert and oriented to person, place, and time.  Skin: Skin is warm and dry. No pallor.     CMP Latest Ref Rng & Units 02/27/2017  Glucose 65 - 99 mg/dL 121(H)  BUN 6 - 20 mg/dL 23(H)  Creatinine 0.61 - 1.24 mg/dL 0.98  Sodium 135 - 145 mmol/L 139  Potassium 3.5 - 5.1 mmol/L 3.5  Chloride 101 - 111 mmol/L 101  CO2 22 - 32 mmol/L 25  Calcium 8.9 - 10.3 mg/dL 8.8(L)  Total Protein 6.5 - 8.1 g/dL 7.1  Total Bilirubin 0.3 -  1.2 mg/dL 0.8  Alkaline Phos 38 - 126 U/L 786(H)  AST 15 - 41 U/L 26  ALT 17 - 63 U/L 20   CBC Latest Ref Rng & Units 02/27/2017  WBC 3.8 - 10.6 K/uL 9.5  Hemoglobin 13.0 - 18.0 g/dL 11.8(L)  Hematocrit 40.0 - 52.0 % 34.6(L)  Platelets 150 - 440 K/uL 188   No results found.  Assessment and plan- Patient is a 70 y.o. 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 However, his PSA responded. Zytiga was added along with prednisone. Due to issues with communication, patient was delayed in receiving Zytiga. Today he reports that he has been taking medication for approximately 2 1/2 weeks. His fatigue level is stable and unchanged. His edema has resolved. Next dose of Lupron due in December 2018. He does continue to have hot flashes but says they are not severe enough to change medication. His alk phos continues to be elevated which is likely secondary to bone mets. His LFTs are normal and stable. TSH on 01/19/17 was stable at 1.5. His testosterone continues to be undetectable. PSA has trended down to 1.07.  Hypertension- He has had several episodes of hypertension prior to starting zytiga and continues to have elevated pressures today. Today his blood pressures were re-checked in office and had not come down. Cardiology was contacted who recommended amlodipine and  to send patient home due him being asymptomatic versus ER.   Anemia of chronic disease- hemoglobin today is 11.8 which is the highest he has been since 2017. Iron studies from today were normal. Will continue to monitor but stable at this time.  Right upper lobe lung nodule- CT scan from 01/05/17 showed 1cm nodule in left upper lung of indeterminate origin. This is a slight decrease in size compared to previous scans. Recommended re-imaging in December 2018 to re-evaluate.    Will plan on having him return to clinic in one month for labs and further evaluation.   Visit Diagnosis 1. Prostate cancer metastatic to bone ([place])   2. Essential (primary) hypertension    Michael Mathews G. Zenia Resides, NP 03/01/17 8:38 AM   Dr. Randa Evens, MD, MPH [place] at [facility] Pager- [phone] 03/01/2017 8:38 AM

## 2017-02-27 NOTE — Telephone Encounter (Signed)
Pt b/p was 176/77 and provider wanted it rechecked and the recheck was 189/83.  I checked it again 30 min later and it was 183/83.  Spoke to Dr. [name] and she states with pt not having a working phone number and the fact that he went to the acute care this past weekend for chest pain and with b/p being elevated she wanted pt to go to ER. He did not want to go.  He states that Dr. Nehemiah Massed told him that his heart was good.  I called Dr. Nehemiah Massed and spoke to him about his b/p and no phone that works and that he does not want to go to ER. Dr. Nehemiah Massed states there is nothing that the ED can do. He is not sx at present time. He reviewed the medications he was on and I told him that he stopped the HCTZ because a provider told him too. He rec: we give him amlodipine 5 mg and he will see md tom. Dr. Nehemiah Massed make an appt for him to be seen 9/19 at 2:30 in [place]. Pt given the appt to him and directions on how to get to [facility] in [place].

## 2017-02-27 NOTE — Progress Notes (Signed)
Repeat BP @ 1500- 189/83 ,p 68. Pt denies headache,lightheadedness -stated he is willing to wait 15 min for repeat VS. Dr [name] informed of VS

## 2017-02-27 NOTE — Progress Notes (Signed)
Pt doing ok on the zytiga, he is experiencing some hot flashes at times during the day. It does not bother him at sleep time. Eating good. No c/o

## 2017-02-28 LAB — TESTOSTERONE: Testosterone: <3 ng/dL — ABNORMAL LOW (ref 264–916)

## 2017-03-01 NOTE — Telephone Encounter (Signed)
Pt did get the medication. I had to have them deliver it to our office and I drove it and gave to pt. Due to his phone issue.

## 2017-03-02 ENCOUNTER — Inpatient Hospital Stay: Payer: Medicare Other | Admitting: Oncology

## 2017-03-02 ENCOUNTER — Inpatient Hospital Stay: Payer: Medicare Other

## 2017-03-12 ENCOUNTER — Telehealth: Payer: Self-pay | Admitting: Pharmacist

## 2017-03-12 ENCOUNTER — Other Ambulatory Visit: Payer: Self-pay | Admitting: *Deleted

## 2017-03-12 MED ORDER — ABIRATERONE ACETATE 250 MG PO TABS: 1000.0000 mg | ORAL_TABLET | Freq: Every day | ORAL | 3 refills | Status: DC

## 2017-03-12 NOTE — Telephone Encounter (Signed)
Oral Oncology Pharmacist Encounter  Received refill prescription for Zytiga for the treatment of metastatic castrate sensitive prostate cancer, planned duration until disease progression or unacceptable drug toxicity. Medication started 02/06/17.  CMP/BP from 02/27/17 assessed, no relevant lab abnormalities. Mr. Maddocks BP is elevated at last visit, this was an occasional problem prior to that start of Zytiga. Oncology is in contact with cardiology to better control BP Prescription dose and frequency assessed.   Current medication list in Epic reviewed, one relevant DDIs with Zytiga identified: Zytiga can increase the concentrations of Metoprolol. HR at last office visit was 53. No current cause for concern. Recommend just monitoring patient for bradycardia.   Prescription was e-scribed to the [facility] and can be filled at [facility].  Will call patient and provide patient education prior to fill.   Darl Pikes, PharmD, BCPS Hematology/Oncology Clinical Pharmacist [facility] [phone]  03/12/2017 2:12 PM

## 2017-03-12 NOTE — Telephone Encounter (Signed)
Oral Oncology Patient Advocate Encounter   [facility] [phone] to let them know he will no longer be getting his meds from them. Spoke with [facility] Patient Advocate [phone] 03/12/2017 3:08 PM

## 2017-03-15 ENCOUNTER — Ambulatory Visit: Payer: Medicare Other | Admitting: Oncology

## 2017-03-15 ENCOUNTER — Other Ambulatory Visit: Payer: Medicare Other

## 2017-03-15 MED FILL — ZYTIGA 250 MG TABLET: 250 | 30 days supply | Qty: 120 | Fill #0

## 2017-03-15 NOTE — Telephone Encounter (Signed)
Oral Chemotherapy Pharmacist Encounter  I spoke with patient in clinic for overview of refill prescription for Ztyigafor the treatment of metastatic castrate sensitive prostate cancer, planned duration until disease progression or unacceptable drug toxicity. Medication started 02/06/17.  Pt was previously filling at an outside pharmacy but the medication is now being filled at [facility]  Pt is doing well.  Reviewed administration, dosing, side effects, monitoring, drug-food interactions, safe handling, storage, and disposal. Patient will take 4 tablets (1,000 mg total) by mouth daily. Take on an empty stomach 1 hour before or 2 hours after a meal  Side effects include but not limited to: fatigue, HTN, edema, hot flashes,     Reviewed with patient importance of keeping a medication schedule and plan for any missed doses.  Michael Mathews voiced understanding and appreciation. All questions answered.  Patient knows to call the office with questions or concerns. Oral Oncology Clinic will continue to follow.  Thank you,  Darl Pikes, PharmD, BCPS Hematology/Oncology Clinical Pharmacist [facility] [phone]  03/15/2017 4:23 PM

## 2017-03-16 ENCOUNTER — Other Ambulatory Visit: Payer: Self-pay | Admitting: *Deleted

## 2017-03-16 MED ORDER — PREDNISONE 5 MG PO TABS: 5.0000 mg | ORAL_TABLET | Freq: Two times a day (BID) | ORAL | 0 refills | Status: DC

## 2017-03-30 ENCOUNTER — Inpatient Hospital Stay: Payer: Medicare Other | Attending: Oncology | Admitting: Oncology

## 2017-03-30 ENCOUNTER — Other Ambulatory Visit: Payer: Self-pay

## 2017-03-30 ENCOUNTER — Inpatient Hospital Stay: Payer: Medicare Other

## 2017-03-30 VITALS — BP 114/66 | HR 57 | Temp 97.8°F | Resp 14 | Wt 224.6 lb

## 2017-03-30 DIAGNOSIS — R531 Weakness: Secondary | ICD-10-CM | POA: Diagnosis not present

## 2017-03-30 DIAGNOSIS — C7951 Secondary malignant neoplasm of bone: Secondary | ICD-10-CM | POA: Insufficient documentation

## 2017-03-30 DIAGNOSIS — I10 Essential (primary) hypertension: Secondary | ICD-10-CM | POA: Insufficient documentation

## 2017-03-30 DIAGNOSIS — Z79899 Other long term (current) drug therapy: Secondary | ICD-10-CM | POA: Diagnosis not present

## 2017-03-30 DIAGNOSIS — M48062 Spinal stenosis, lumbar region with neurogenic claudication: Secondary | ICD-10-CM

## 2017-03-30 DIAGNOSIS — F17210 Nicotine dependence, cigarettes, uncomplicated: Secondary | ICD-10-CM | POA: Diagnosis not present

## 2017-03-30 DIAGNOSIS — Z7982 Long term (current) use of aspirin: Secondary | ICD-10-CM | POA: Diagnosis not present

## 2017-03-30 DIAGNOSIS — C61 Malignant neoplasm of prostate: Secondary | ICD-10-CM | POA: Diagnosis present

## 2017-03-30 DIAGNOSIS — E119 Type 2 diabetes mellitus without complications: Secondary | ICD-10-CM | POA: Diagnosis not present

## 2017-03-30 DIAGNOSIS — I2510 Atherosclerotic heart disease of native coronary artery without angina pectoris: Secondary | ICD-10-CM | POA: Diagnosis not present

## 2017-03-30 DIAGNOSIS — J449 Chronic obstructive pulmonary disease, unspecified: Secondary | ICD-10-CM | POA: Diagnosis not present

## 2017-03-30 DIAGNOSIS — G4730 Sleep apnea, unspecified: Secondary | ICD-10-CM | POA: Diagnosis not present

## 2017-03-30 DIAGNOSIS — M545 Low back pain: Secondary | ICD-10-CM | POA: Insufficient documentation

## 2017-03-30 DIAGNOSIS — Z7984 Long term (current) use of oral hypoglycemic drugs: Secondary | ICD-10-CM | POA: Diagnosis not present

## 2017-03-30 LAB — CBC WITH DIFFERENTIAL/PLATELET
BASOS PCT: 3 %
Basophils Absolute: 0.1 10*3/uL (ref 0–0.1)
EOS ABS: 0.1 10*3/uL (ref 0–0.7)
Eosinophils Relative: 1 %
HCT: 29.7 % — ABNORMAL LOW (ref 40.0–52.0)
HEMOGLOBIN: 10 g/dL — AB (ref 13.0–18.0)
LYMPHS ABS: 1.1 10*3/uL (ref 1.0–3.6)
Lymphocytes Relative: 25 %
MCH: 32.4 pg (ref 26.0–34.0)
MCHC: 33.7 g/dL (ref 32.0–36.0)
MCV: 96.1 fL (ref 80.0–100.0)
MONO ABS: 0.5 10*3/uL (ref 0.2–1.0)
MONOS PCT: 11 %
Neutro Abs: 2.6 10*3/uL (ref 1.4–6.5)
Neutrophils Relative %: 60 %
Platelets: 240 10*3/uL (ref 150–440)
RBC: 3.09 MIL/uL — ABNORMAL LOW (ref 4.40–5.90)
RDW: 14.9 % — AB (ref 11.5–14.5)
WBC: 4.4 10*3/uL (ref 3.8–10.6)

## 2017-03-30 LAB — COMPREHENSIVE METABOLIC PANEL
ALK PHOS: 950 U/L — AB (ref 38–126)
ALT: 21 U/L (ref 17–63)
AST: 28 U/L (ref 15–41)
Albumin: 3.2 g/dL — ABNORMAL LOW (ref 3.5–5.0)
Anion gap: 11 (ref 5–15)
BILIRUBIN TOTAL: 0.6 mg/dL (ref 0.3–1.2)
BUN: 19 mg/dL (ref 6–20)
CALCIUM: 8.8 mg/dL — AB (ref 8.9–10.3)
CO2: 26 mmol/L (ref 22–32)
CREATININE: 0.89 mg/dL (ref 0.61–1.24)
Chloride: 103 mmol/L (ref 101–111)
GFR calc Af Amer: >60 mL/min (ref >60)
GLUCOSE: 114 mg/dL — AB (ref 65–99)
Potassium: 3.3 mmol/L — ABNORMAL LOW (ref 3.5–5.1)
Sodium: 140 mmol/L (ref 135–145)
TOTAL PROTEIN: 7.1 g/dL (ref 6.5–8.1)

## 2017-03-30 LAB — PSA: PROSTATIC SPECIFIC ANTIGEN: 0.59 ng/mL (ref 0.00–4.00)

## 2017-03-30 NOTE — Progress Notes (Signed)
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 patient is 71 year old male with a past medical history significant for hypertension diabetes and COPD among other medical problems. He was admitted to the hospital in October 2017 with some symptoms of dizziness and abdominal pain. CT angiogram abdomen incidentally showed sclerotic metastatic disease throughout the visualized spine.   2. This was followed by an MRI of the lumbar spine which showed multifocal T1 and T2 hypointense lesions with areas of increased signal on STIR sequence are seen in multiple vertebral bodies. Largest lesions are in T11 T12 L1 and in the imaged sacrum. This is consistent with metastatic disease. Primary lesion is not identified. MRI brain showed no acute intracranial abnormality.   3. Patient was noted to have an elevated PSA and was referred to urology as an outpatient. He was recently seen by Dr. Tresa Moore on 05/29/2016. PSA was repeated at that time which came back elevated at 10.3. 3 months over the value was 10.81.  4. Bone scan on 07/07/16 showed : Widespread radiotracer uptake over the axial and appendicular skeleton as described compatible with metastatic disease and correlating with sclerotic lesions on Ct. CT chest on 07/07/16 showed: Scattered sclerotic osseous metastatic lesions throughout spine, pelvis, proximal femora, ribs, and manubrium. Prostatic enlargement with thickened bladder wall question related to chronic bladder outlet obstruction.  Spiculated 12 x 11 x 7 mm LEFT upper lobe nodule question primary  pulmonary neoplasm. Stable nonspecific small LEFT adrenal nodule. Single minimally enlarged AP window lymph node. BILATERAL inguinal hernias containing fat. 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 Recommend annual imaging followup by CTA or MRA.   5. Anemia work up from 06/20/16 was as follows: CBC showed white count of 5.2, H&H of 8.1/25 and a platelet count of 164. CMP showed elevated alkaline phosphatase of 391. Multiple myeloma panel showed normal quantitative immunoglobulins and no monoclonal M protein.Vitamin B12 level was low normal at 225. LDH was mildly elevated at 234. Ferritin was low normal at 27. Reticulocyte count was 2.4% inappropriately low for the degree of anemia. Haptoglobin was elevated at 273.  6. Patients case was discussed at tumor board and plan was to watch the lung nodule with scans as it was in a difficult area to biopsy. CT guided bone biopsy was obtained which showed metastatic prostate cancer for which he is on lupron  7. Given that he does not have visceral or lymph node metastasis, docetaxel/zytigawas not started. Also patient lives alone and does not have a good social support or means of communication. Those options to be consideredat progression.   8. Bone scan on 12/22/2016 showed significantly worsening osseous metastatic disease. Left upper lobe nodule was slightly smaller in size. Zytiga was added in setting of worsening bone disease along with prednisone in aug 2018. Lupron administered 02/13/2017.   Interval history- he is tolerating zytiga well. Does have fatigue which is at his baseline. He has developed erythematous rash in his right foot involving his soles starting about 1.5 weeks ago. Denies any contact with chemicals or toxins. He is  seeing a dermatologist next week. Also reports ongoing low back pain and generalized weakness in his legs. Reports  having difficulty with ambulation as he feels "legs give way"  ECOG PS- 1 Pain scale- 4   Review of systems- Review of Systems  Constitutional: Positive for malaise/fatigue. Negative for chills, fever and weight loss.  HENT: Negative for congestion, ear discharge and nosebleeds.   Eyes: Negative for blurred vision.  Respiratory: Negative for cough, hemoptysis, sputum production, shortness of breath and wheezing.   Cardiovascular: Negative for chest pain, palpitations, orthopnea and claudication.  Gastrointestinal: Negative for abdominal pain, blood in stool, constipation, diarrhea, heartburn, melena, nausea and vomiting.  Genitourinary: Negative for dysuria, flank pain, frequency, hematuria and urgency.  Musculoskeletal: Positive for back pain. Negative for joint pain and myalgias.  Skin: Positive for rash.  Neurological: Positive for weakness. Negative for dizziness, tingling, focal weakness, seizures and headaches.  Endo/Heme/Allergies: Does not bruise/bleed easily.  Psychiatric/Behavioral: Negative for depression and suicidal ideas. The patient does not have insomnia.       No Known Allergies   Past Medical History:  Diagnosis Date  . Arthritis   . Asthma   . Cancer [facility])    prostate  . COPD (chronic obstructive pulmonary disease) ([place])   . Coronary artery disease   . Cough    chronic  . Diabetes mellitus without complication ([place])   . Edema   . Elevated PSA   . Headache   . Hypertension   . Orthopnea   . Oxygen deficit    o2 prn  . Shortness of breath dyspnea   . Sleep apnea    cpap     Past Surgical History:  Procedure Laterality Date  . CARDIAC CATHETERIZATION  2014  . COLONOSCOPY    . CYSTOSCOPY W/ RETROGRADES Bilateral 10/18/2016   Procedure: CYSTOSCOPY WITH RETROGRADE PYELOGRAM;  Surgeon: Hollice Espy, MD;  Location: [facility] ORS;  Service: Urology;  Laterality: Bilateral;  . EYE SURGERY    . KNEE ARTHROSCOPY    . TRANSURETHRAL RESECTION OF BLADDER TUMOR  N/A 10/18/2016   Procedure: TRANSURETHRAL RESECTION OF BLADDER TUMOR (TURBT);  Surgeon: Hollice Espy, MD;  Location: [facility] ORS;  Service: Urology;  Laterality: N/A;  . TRANSURETHRAL RESECTION OF PROSTATE N/A 10/18/2016   Procedure: TRANSURETHRAL RESECTION OF THE PROSTATE (TURP) CHANNEL TURP;  Surgeon: Hollice Espy, MD;  Location: [facility] ORS;  Service: Urology;  Laterality: N/A;    Social History   Social History  . Marital status: Single    Spouse name: N/A  . Number of children: N/A  . Years of education: N/A   Occupational History  . Not on file.   Social History Main Topics  . Smoking status: Current Every Day Smoker    Packs/day: 0.50    Years: 55.00  . Smokeless tobacco: Never Used  . Alcohol use No  . Drug use: No  . Sexual activity: Not on file   Other Topics Concern  . Not on file   Social History Narrative  . No narrative on file    Family History  Problem Relation Age of Onset  . CVA Mother   . CAD Father      Current Outpatient Prescriptions:  .  abiraterone Acetate (ZYTIGA) 250 MG tablet, Take 4 tablets (1,000 mg total) by mouth daily. Take on an empty stomach 1 hour before or 2 hours after a meal, Disp: 120 tablet, Rfl: 3 .  aspirin EC 325 MG tablet, Take 325 mg by mouth every  morning., Disp: , Rfl:  .  atorvastatin (LIPITOR) 40 MG tablet, Take 1 tablet (40 mg total) by mouth daily., Disp: 30 tablet, Rfl: 0 .  cloNIDine (CATAPRES) 0.2 MG tablet, Take 1 tablet (0.2 mg total) by mouth 2 (two) times daily., Disp: 60 tablet, Rfl: 2 .  DULoxetine (CYMBALTA) 30 MG capsule, Take 30 mg by mouth daily., Disp: , Rfl:  .  ferrous sulfate 325 (65 FE) MG EC tablet, Take 1 tablet (325 mg total) by mouth 2 (two) times daily with a meal., Disp: 60 tablet, Rfl: 3 .  finasteride (PROSCAR) 5 MG tablet, Take 5 mg by mouth daily., Disp: , Rfl:  .  Fluticasone-Salmeterol (ADVAIR) 250-50 MCG/DOSE AEPB, Inhale 1 puff into the lungs 2 (two) times daily., Disp: , Rfl:  .   hydrALAZINE (APRESOLINE) 100 MG tablet, Take 100 mg by mouth 3 (three) times daily. , Disp: , Rfl:  .  losartan (COZAAR) 100 MG tablet, Take 100 mg by mouth daily., Disp: , Rfl:  .  metFORMIN (GLUCOPHAGE) 500 MG tablet, Take 500 mg by mouth 2 (two) times daily with a meal., Disp: , Rfl:  .  metoprolol (LOPRESSOR) 50 MG tablet, Take 50 mg by mouth 2 (two) times daily., Disp: , Rfl:  .  predniSONE (DELTASONE) 5 MG tablet, Take 1 tablet (5 mg total) by mouth 2 (two) times daily with a meal., Disp: 60 tablet, Rfl: 0 .  tamsulosin (FLOMAX) 0.4 MG CAPS capsule, Take 1 capsule (0.4 mg total) by mouth daily., Disp: 90 capsule, Rfl: 3 .  vitamin B-12 (CYANOCOBALAMIN) 1000 MCG tablet, Take 1 tablet (1,000 mcg total) by mouth daily., Disp: 30 tablet, Rfl: 3  Physical exam:  Vitals:   03/30/17 1344  BP: 114/66  Pulse: (!) 57  Resp: 14  Temp: 97.8 F (36.6 C)  TempSrc: Tympanic  Weight: 224 lb 9.6 oz (101.9 kg)   Physical Exam  Constitutional: He is oriented to person, place, and time and well-developed, well-nourished, and in no distress.  HENT:  Head: Normocephalic and atraumatic.  Eyes: Pupils are equal, round, and reactive to light. EOM are normal.  Neck: Normal range of motion.  Cardiovascular: Normal rate, regular rhythm and normal heart sounds.   Pulmonary/Chest: Effort normal and breath sounds normal.  Abdominal: Soft. Bowel sounds are normal.  Neurological: He is alert and oriented to person, place, and time.  Skin:  Diffuse erythematous macular rash is seen over sole of his right foot. No rash seen in his left foot or other areas      CMP Latest Ref Rng & Units 03/30/2017  Glucose 65 - 99 mg/dL 114(H)  BUN 6 - 20 mg/dL 19  Creatinine 0.61 - 1.24 mg/dL 0.89  Sodium 135 - 145 mmol/L 140  Potassium 3.5 - 5.1 mmol/L 3.3(L)  Chloride 101 - 111 mmol/L 103  CO2 22 - 32 mmol/L 26  Calcium 8.9 - 10.3 mg/dL 8.8(L)  Total Protein 6.5 - 8.1 g/dL 7.1  Total Bilirubin 0.3 - 1.2 mg/dL 0.6    Alkaline Phos 38 - 126 U/L 950(H)  AST 15 - 41 U/L 28  ALT 17 - 63 U/L 21   CBC Latest Ref Rng & Units 03/30/2017  WBC 3.8 - 10.6 K/uL 4.4  Hemoglobin 13.0 - 18.0 g/dL 10.0(L)  Hematocrit 40.0 - 52.0 % 29.7(L)  Platelets 150 - 440 K/uL 240     Assessment and plan- Patient is a 71 y.o. male with castrate sensitive prostate cancer on zytiga and  lupron  Last dose of lupron was in sept 2018. Next dose due in dec 2018  PSA from today is pending. His PSA responded to lupron but his bone mets were significantly worse on bone scan due to which zytiga was initiated with prednisone. It has been 2 months since zytiga was started. Alk phos continues to increase which is concerning. I will plan to get repeat bone scan in 2 months from now as PSA has not been predictive in his case  Low back pain and generalized weakness. No focal weakness or sensory deficit on todays exam. I will however obtain MRI thoraco lumbar spine with and without contrast to evaluate his back pain further  Right foot rash- this does not appear like a drug rash and is localized to the sole of his right foot. I doubt this is from zytiga. He will continue zytiga at this time. Await dermatology input next week   RTC in 1 month with cbc, cmp, PSA. MRI prior   Visit Diagnosis 1. Prostate cancer metastatic to bone [facility])      Dr. Randa Evens, MD, MPH [facility] Pager- [phone] 03/30/2017 4:57 PM              '

## 2017-03-30 NOTE — Progress Notes (Signed)
Patient is here today for a follow up. Patient states no new concerns today.  

## 2017-03-31 LAB — TESTOSTERONE: Testosterone: <3 ng/dL — ABNORMAL LOW (ref 264–916)

## 2017-04-06 ENCOUNTER — Ambulatory Visit
Admission: RE | Admit: 2017-04-06 | Discharge: 2017-04-06 | Disposition: A | Discharge status: Home / Self Care | Payer: Medicare Other | Source: Ambulatory Visit | Attending: Oncology | Admitting: Oncology

## 2017-04-06 ENCOUNTER — Ambulatory Visit: Payer: Medicare Other

## 2017-04-06 DIAGNOSIS — I77811 Abdominal aortic ectasia: Secondary | ICD-10-CM | POA: Diagnosis not present

## 2017-04-06 DIAGNOSIS — M47812 Spondylosis without myelopathy or radiculopathy, cervical region: Secondary | ICD-10-CM | POA: Diagnosis not present

## 2017-04-06 DIAGNOSIS — M4804 Spinal stenosis, thoracic region: Secondary | ICD-10-CM | POA: Diagnosis not present

## 2017-04-06 DIAGNOSIS — N281 Cyst of kidney, acquired: Secondary | ICD-10-CM | POA: Diagnosis not present

## 2017-04-06 DIAGNOSIS — C61 Malignant neoplasm of prostate: Secondary | ICD-10-CM | POA: Insufficient documentation

## 2017-04-06 DIAGNOSIS — C7951 Secondary malignant neoplasm of bone: Secondary | ICD-10-CM | POA: Diagnosis not present

## 2017-04-06 DIAGNOSIS — M5136 Other intervertebral disc degeneration, lumbar region: Secondary | ICD-10-CM | POA: Insufficient documentation

## 2017-04-06 DIAGNOSIS — M48061 Spinal stenosis, lumbar region without neurogenic claudication: Secondary | ICD-10-CM | POA: Diagnosis not present

## 2017-04-06 DIAGNOSIS — R911 Solitary pulmonary nodule: Secondary | ICD-10-CM | POA: Insufficient documentation

## 2017-04-06 DIAGNOSIS — E882 Lipomatosis, not elsewhere classified: Secondary | ICD-10-CM | POA: Insufficient documentation

## 2017-04-06 MED ORDER — GADOBENATE DIMEGLUMINE 529 MG/ML IV SOLN
20.0000 mL | Freq: Once | INTRAVENOUS | Status: AC | PRN
  Administered 2017-04-06: 20 mL via INTRAVENOUS

## 2017-04-11 MED FILL — ZYTIGA 250 MG TABLET: 250 | 30 days supply | Qty: 120 | Fill #1

## 2017-04-17 ENCOUNTER — Ambulatory Visit: Payer: Medicare Other

## 2017-04-17 ENCOUNTER — Other Ambulatory Visit: Payer: Self-pay | Admitting: *Deleted

## 2017-04-17 MED ORDER — PREDNISONE 5 MG PO TABS: 5.0000 mg | ORAL_TABLET | Freq: Two times a day (BID) | ORAL | 0 refills | Status: DC

## 2017-04-27 ENCOUNTER — Inpatient Hospital Stay: Payer: Medicare Other | Attending: Oncology

## 2017-04-27 ENCOUNTER — Telehealth: Payer: Self-pay | Admitting: Oncology

## 2017-04-27 ENCOUNTER — Inpatient Hospital Stay (HOSPITAL_BASED_OUTPATIENT_CLINIC_OR_DEPARTMENT_OTHER): Payer: Medicare Other | Admitting: Oncology

## 2017-04-27 ENCOUNTER — Other Ambulatory Visit: Payer: Self-pay

## 2017-04-27 VITALS — BP 115/57 | HR 80 | Temp 95.8°F | Resp 18 | Wt 238.2 lb

## 2017-04-27 DIAGNOSIS — E119 Type 2 diabetes mellitus without complications: Secondary | ICD-10-CM | POA: Diagnosis not present

## 2017-04-27 DIAGNOSIS — Z79899 Other long term (current) drug therapy: Secondary | ICD-10-CM

## 2017-04-27 DIAGNOSIS — D638 Anemia in other chronic diseases classified elsewhere: Secondary | ICD-10-CM

## 2017-04-27 DIAGNOSIS — I2510 Atherosclerotic heart disease of native coronary artery without angina pectoris: Secondary | ICD-10-CM | POA: Diagnosis not present

## 2017-04-27 DIAGNOSIS — G4730 Sleep apnea, unspecified: Secondary | ICD-10-CM | POA: Insufficient documentation

## 2017-04-27 DIAGNOSIS — F17210 Nicotine dependence, cigarettes, uncomplicated: Secondary | ICD-10-CM

## 2017-04-27 DIAGNOSIS — C7951 Secondary malignant neoplasm of bone: Secondary | ICD-10-CM

## 2017-04-27 DIAGNOSIS — M5137 Other intervertebral disc degeneration, lumbosacral region: Secondary | ICD-10-CM | POA: Insufficient documentation

## 2017-04-27 DIAGNOSIS — K4020 Bilateral inguinal hernia, without obstruction or gangrene, not specified as recurrent: Secondary | ICD-10-CM | POA: Diagnosis not present

## 2017-04-27 DIAGNOSIS — J449 Chronic obstructive pulmonary disease, unspecified: Secondary | ICD-10-CM | POA: Insufficient documentation

## 2017-04-27 DIAGNOSIS — I10 Essential (primary) hypertension: Secondary | ICD-10-CM | POA: Diagnosis not present

## 2017-04-27 DIAGNOSIS — N281 Cyst of kidney, acquired: Secondary | ICD-10-CM | POA: Insufficient documentation

## 2017-04-27 DIAGNOSIS — I700 Atherosclerosis of aorta: Secondary | ICD-10-CM | POA: Insufficient documentation

## 2017-04-27 DIAGNOSIS — M419 Scoliosis, unspecified: Secondary | ICD-10-CM | POA: Diagnosis not present

## 2017-04-27 DIAGNOSIS — C61 Malignant neoplasm of prostate: Secondary | ICD-10-CM | POA: Insufficient documentation

## 2017-04-27 DIAGNOSIS — M5126 Other intervertebral disc displacement, lumbar region: Secondary | ICD-10-CM | POA: Diagnosis not present

## 2017-04-27 DIAGNOSIS — I712 Thoracic aortic aneurysm, without rupture: Secondary | ICD-10-CM | POA: Insufficient documentation

## 2017-04-27 DIAGNOSIS — M48061 Spinal stenosis, lumbar region without neurogenic claudication: Secondary | ICD-10-CM | POA: Insufficient documentation

## 2017-04-27 LAB — CBC WITH DIFFERENTIAL/PLATELET
BASOS PCT: 1 %
Basophils Absolute: 0.1 10*3/uL (ref 0–0.1)
Eosinophils Absolute: 0 10*3/uL (ref 0–0.7)
Eosinophils Relative: 1 %
HEMATOCRIT: 34.2 % — AB (ref 40.0–52.0)
HEMOGLOBIN: 11.2 g/dL — AB (ref 13.0–18.0)
LYMPHS PCT: 12 %
Lymphs Abs: 1 10*3/uL (ref 1.0–3.6)
MCH: 32.7 pg (ref 26.0–34.0)
MCHC: 32.6 g/dL (ref 32.0–36.0)
MCV: 100.3 fL — AB (ref 80.0–100.0)
MONO ABS: 0.6 10*3/uL (ref 0.2–1.0)
MONOS PCT: 8 %
NEUTROS ABS: 6.1 10*3/uL (ref 1.4–6.5)
NEUTROS PCT: 78 %
Platelets: 175 10*3/uL (ref 150–440)
RBC: 3.41 MIL/uL — ABNORMAL LOW (ref 4.40–5.90)
RDW: 15.4 % — AB (ref 11.5–14.5)
WBC: 7.8 10*3/uL (ref 3.8–10.6)

## 2017-04-27 LAB — COMPREHENSIVE METABOLIC PANEL
ALBUMIN: 3.3 g/dL — AB (ref 3.5–5.0)
ALK PHOS: 819 U/L — AB (ref 38–126)
ALT: 20 U/L (ref 17–63)
ANION GAP: 10 (ref 5–15)
AST: 25 U/L (ref 15–41)
BUN: 23 mg/dL — ABNORMAL HIGH (ref 6–20)
CHLORIDE: 102 mmol/L (ref 101–111)
CO2: 26 mmol/L (ref 22–32)
Calcium: 8.7 mg/dL — ABNORMAL LOW (ref 8.9–10.3)
Creatinine, Ser: 0.98 mg/dL (ref 0.61–1.24)
GFR calc non Af Amer: >60 mL/min (ref >60)
GLUCOSE: 176 mg/dL — AB (ref 65–99)
POTASSIUM: 4.4 mmol/L (ref 3.5–5.1)
SODIUM: 138 mmol/L (ref 135–145)
Total Bilirubin: 0.6 mg/dL (ref 0.3–1.2)
Total Protein: 7 g/dL (ref 6.5–8.1)

## 2017-04-27 LAB — PSA: PROSTATIC SPECIFIC ANTIGEN: 1.04 ng/mL (ref 0.00–4.00)

## 2017-04-27 NOTE — Progress Notes (Signed)
Pt here today for follow up. In [facility] -stated that he lives in a mobile home,has no family,never married. Drives here-does own meds and shops for self. Admitted fell 3 wks ago and lay on the floor for 45 min till able to get self up. ( does not voice concerns w this  ) admitted to feeling dizzy more often lately.  Stated he has no phone and gave emergency number as land lord- [place] [phone]  ( CE [place]ark called by this Probation officer and he stated he would be an emergency contact and wanted me to know he is 29 but would call son in law to give pt a message as needed )  .  Pt stated he Sees Dr Clide Deutscher as PCP @ [facility] .  Stated he used to " have family services or someone " would come out to check on him every 2 weeks.  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 He denies he needs help . Dr [name] and [facility] RN informed also of pt current situation. -ADD spoke w Dr [name] this writer called [place] who is not here this pm. Lengthy message w this concern left on his VM. Call to [facility] [phone] ,sp w Tammy who said pt not a former client -cannot offer in home asst. Appropriate for this gentleman. Dr [name] suggested palliative home care. Doni RN to discuss case and referral w Dr [name]. Referral made for life path/palliative care assessment. Doni RN-made referral and will fax. ( [facility] )

## 2017-04-27 NOTE — Telephone Encounter (Signed)
CT CAP and Bone Scan schd and conf with patient for 05/15/17, per 04/27/17 los. (Doni went to p/u Oral Contrast from [place] for patient) Lab/MD/Lupron Inj schd and conf with patient for 05/17/17. Patient was given a copy of appt calendar and AVS report. Patient instructed NPO 6 hrs prior to tests on 05/15/17. Patient has copy of Instructions for tests prep and 2 bottles of Oral Contrast. Patient is aware and fully understands.

## 2017-04-27 NOTE — Progress Notes (Signed)
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 patient is 71 year old male with a past medical history significant for hypertension diabetes and COPD among other medical problems. He was admitted to the hospital in October 2017 with some symptoms of dizziness and abdominal pain. CT angiogram abdomen incidentally showed sclerotic metastatic disease throughout the visualized spine.   2. This was followed by an MRI of the lumbar spine which showed multifocal T1 and T2 hypointense lesions with areas of increased signal on STIR sequence are seen in multiple vertebral bodies. Largest lesions are in T11 T12 L1 and in the imaged sacrum. This is consistent with metastatic disease. Primary lesion is not identified. MRI brain showed no acute intracranial abnormality.   3. Patient was noted to have an elevated PSA and was referred to urology as an outpatient. He was recently seen by Dr. Tresa Moore on 05/29/2016. PSA was repeated at that time which came back elevated at 10.3. 3 months over the value was 10.81.  4. Bone scan on 07/07/16 showed : Widespread radiotracer uptake over the axial and appendicular skeleton as described compatible with metastatic disease and correlating with sclerotic lesions on Ct. CT chest on 07/07/16 showed: Scattered sclerotic osseous metastatic lesions throughout spine, pelvis, proximal femora, ribs, and manubrium. Prostatic enlargement with thickened bladder wall question related to chronic bladder outlet obstruction.  Spiculated 12 x 11 x 7 mm LEFT upper lobe nodule question primary  pulmonary neoplasm. Stable nonspecific small LEFT adrenal nodule. Single minimally enlarged AP window lymph node. BILATERAL inguinal hernias containing fat. 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 Recommend annual imaging followup by CTA or MRA.   5. Anemia work up from 06/20/16 was as follows: CBC showed white count of 5.2, H&H of 8.1/25 and a platelet count of 164. CMP showed elevated alkaline phosphatase of 391. Multiple myeloma panel showed normal quantitative immunoglobulins and no monoclonal M protein.Vitamin B12 level was low normal at 225. LDH was mildly elevated at 234. Ferritin was low normal at 27. Reticulocyte count was 2.4% inappropriately low for the degree of anemia. Haptoglobin was elevated at 273.  6. Patients case was discussed at tumor board and plan was to watch the lung nodule with scans as it was in a difficult area to biopsy. CT guided bone biopsy was obtained which showed metastatic prostate cancer for which he is on lupron  7. Given that he does not have visceral or lymph node metastasis, docetaxel/zytigawas not started. Also patient lives alone and does not have a good social support or means of communication. Those options to be consideredat progression.   8. Bone scan on 12/22/2016 showed significantly worsening osseous metastatic disease. Left upper lobe nodule was slightly smaller in size. Zytiga was added in setting of worsening bone disease along with prednisone in aug 2018. Lupron administered 02/13/2017.     Interval history- he continues to live alone and has poor social support. Reports having occasional falls on and off. He ambulates independently as well as with a walker. Says that he takes his zytiga and prednisone without missing any  doses  ECOG PS- 1-2 Pain scale- 4- chronic back pain Opioid associated constipation- no  Review of systems- Review  of Systems  Constitutional: Positive for malaise/fatigue. Negative for chills, fever and weight loss.  HENT: Negative for congestion, ear discharge and nosebleeds.   Eyes: Negative for blurred vision.  Respiratory: Negative for cough, hemoptysis, sputum production, shortness of breath and wheezing.   Cardiovascular: Negative for chest pain, palpitations, orthopnea and claudication.  Gastrointestinal: Negative for abdominal pain, blood in stool, constipation, diarrhea, heartburn, melena, nausea and vomiting.  Genitourinary: Negative for dysuria, flank pain, frequency, hematuria and urgency.  Musculoskeletal: Positive for back pain. Negative for joint pain and myalgias.  Skin: Negative for rash.  Neurological: Negative for dizziness, tingling, focal weakness, seizures, weakness and headaches.  Endo/Heme/Allergies: Does not bruise/bleed easily.  Psychiatric/Behavioral: Negative for depression and suicidal ideas. The patient does not have insomnia.        No Known Allergies   Past Medical History:  Diagnosis Date  . Arthritis   . Asthma   . Cancer [facility])    prostate  . COPD (chronic obstructive pulmonary disease) ([place])   . Coronary artery disease   . Cough    chronic  . Diabetes mellitus without complication ([place])   . Edema   . Elevated PSA   . Headache   . Hypertension   . Orthopnea   . Oxygen deficit    o2 prn  . Shortness of breath dyspnea   . Sleep apnea    cpap     Past Surgical History:  Procedure Laterality Date  . CARDIAC CATHETERIZATION  2014  . COLONOSCOPY    . CYSTOSCOPY WITH RETROGRADE PYELOGRAM Bilateral 10/18/2016   Performed by Hollice Espy, MD at [facility] ORS  . EYE SURGERY    . KNEE ARTHROSCOPY    . TRANSURETHRAL RESECTION OF BLADDER TUMOR (TURBT) N/A 10/18/2016   Performed by Hollice Espy, MD at [facility] ORS  . TRANSURETHRAL RESECTION OF THE PROSTATE (TURP) CHANNEL TURP N/A 10/18/2016   Performed by Hollice Espy, MD at [facility] ORS    Social History    Socioeconomic History  . Marital status: Single    Spouse name: Not on file  . Number of children: Not on file  . Years of education: Not on file  . Highest education level: Not on file  Social Needs  . Financial resource strain: Not on file  . Food insecurity - worry: Not on file  . Food insecurity - inability: Not on file  . Transportation needs - medical: Not on file  . Transportation needs - non-medical: Not on file  Occupational History  . Not on file  Tobacco Use  . Smoking status: Current Every Day Smoker    Packs/day: 0.50    Years: 55.00    Pack years: 27.50  . Smokeless tobacco: Never Used  Substance and Sexual Activity  . Alcohol use: No  . Drug use: No  . Sexual activity: Not on file  Other Topics Concern  . Not on file  Social History Narrative  . Not on file    Family History  Problem Relation Age of Onset  . CVA Mother   . CAD Father      Current Outpatient Medications:  .  aspirin EC 325 MG tablet, Take 325 mg by mouth every morning., Disp: , Rfl:  .  atorvastatin (LIPITOR) 40 MG tablet, Take 1 tablet (40 mg total) by mouth daily., Disp: 30 tablet, Rfl: 0 .  cloNIDine (  CATAPRES) 0.2 MG tablet, Take 1 tablet (0.2 mg total) by mouth 2 (two) times daily., Disp: 60 tablet, Rfl: 2 .  ferrous sulfate 325 (65 FE) MG EC tablet, Take 1 tablet (325 mg total) by mouth 2 (two) times daily with a meal., Disp: 60 tablet, Rfl: 3 .  Fluticasone-Salmeterol (ADVAIR) 250-50 MCG/DOSE AEPB, Inhale 1 puff into the lungs 2 (two) times daily., Disp: , Rfl:  .  hydrALAZINE (APRESOLINE) 100 MG tablet, Take 100 mg by mouth 3 (three) times daily. , Disp: , Rfl:  .  ketoconazole (NIZORAL) 2 % cream, , Disp: , Rfl:  .  losartan (COZAAR) 100 MG tablet, Take 100 mg by mouth daily., Disp: , Rfl:  .  metFORMIN (GLUCOPHAGE) 500 MG tablet, Take 500 mg by mouth 2 (two) times daily with a meal., Disp: , Rfl:  .  metoprolol (LOPRESSOR) 50 MG tablet, Take 50 mg by mouth 2 (two) times  daily., Disp: , Rfl:  .  vitamin B-12 (CYANOCOBALAMIN) 1000 MCG tablet, Take 1 tablet (1,000 mcg total) by mouth daily., Disp: 30 tablet, Rfl: 3 .  abiraterone Acetate (ZYTIGA) 250 MG tablet, Take 4 tablets (1,000 mg total) by mouth daily. Take on an empty stomach 1 hour before or 2 hours after a meal (Patient not taking: Reported on 04/27/2017), Disp: 120 tablet, Rfl: 3 .  DULoxetine (CYMBALTA) 30 MG capsule, Take 30 mg by mouth daily., Disp: , Rfl:  .  finasteride (PROSCAR) 5 MG tablet, Take 5 mg by mouth daily., Disp: , Rfl:  .  GLIPIZIDE XL 10 MG 24 hr tablet, , Disp: , Rfl:  .  predniSONE (DELTASONE) 5 MG tablet, Take 1 tablet (5 mg total) 2 (two) times daily with a meal by mouth., Disp: 180 tablet, Rfl: 0 .  tamsulosin (FLOMAX) 0.4 MG CAPS capsule, Take 1 capsule (0.4 mg total) by mouth daily., Disp: 90 capsule, Rfl: 3  Physical exam:  Vitals:   04/27/17 1441  BP: (!) 115/57  Pulse: 80  Resp: 18  Temp: (!) 95.8 F (35.4 C)  TempSrc: Tympanic  Weight: 238 lb 3.2 oz (108 kg)   Physical Exam  Constitutional: He is oriented to person, place, and time and well-developed, well-nourished, and in no distress.  HENT:  Head: Normocephalic and atraumatic.  Eyes: EOM are normal. Pupils are equal, round, and reactive to light.  Neck: Normal range of motion.  Cardiovascular: Normal rate, regular rhythm and normal heart sounds.  Pulmonary/Chest: Effort normal.  Scattered b/l wheezing  Abdominal: Soft. Bowel sounds are normal.  Neurological: He is alert and oriented to person, place, and time.  Skin: Skin is warm and dry.     CMP Latest Ref Rng & Units 04/27/2017  Glucose 65 - 99 mg/dL 176(H)  BUN 6 - 20 mg/dL 23(H)  Creatinine 0.61 - 1.24 mg/dL 0.98  Sodium 135 - 145 mmol/L 138  Potassium 3.5 - 5.1 mmol/L 4.4  Chloride 101 - 111 mmol/L 102  CO2 22 - 32 mmol/L 26  Calcium 8.9 - 10.3 mg/dL 8.7(L)  Total Protein 6.5 - 8.1 g/dL 7.0  Total Bilirubin 0.3 - 1.2 mg/dL 0.6  Alkaline Phos  38 - 126 U/L 819(H)  AST 15 - 41 U/L 25  ALT 17 - 63 U/L 20   CBC Latest Ref Rng & Units 04/27/2017  WBC 3.8 - 10.6 K/uL 7.8  Hemoglobin 13.0 - 18.0 g/dL 11.2(L)  Hematocrit 40.0 - 52.0 % 34.2(L)  Platelets 150 - 440 K/uL 175  No images are attached to the encounter.  Mr Thoracic Spine W Wo Contrast  Result Date: 04/06/2017 CLINICAL DATA:  Low back pain radiating into both hips for approximately 4 months. No acute injury or prior relevant surgery. History of prostate cancer. EXAM: MRI THORACIC AND LUMBAR SPINE WITHOUT AND WITH CONTRAST TECHNIQUE: Multiplanar and multiecho pulse sequences of the thoracic and lumbar spine were obtained without and with intravenous contrast. CONTRAST:  52m MULTIHANCE GADOBENATE DIMEGLUMINE 529 MG/ML IV SOLN COMPARISON:  Whole body bone scan 12/22/2016. CTs of the chest, abdomen and pelvis 01/05/2017. FINDINGS: MRI THORACIC SPINE FINDINGS Alignment:  Stable and near anatomic. Vertebrae: As correlated with prior studies, there is diffuse blastic osseous metastatic disease throughout the visualized vertebral bodies and ribs. There is associated mild heterogeneous enhancement following contrast. No pathologic fracture or epidural tumor seen. Cord: There is prominent epidural fat, especially posteriorly in the midthoracic spine. The thoracic cord appears normal in signal and caliber. No abnormal intradural enhancement seen. Paraspinal and other soft tissues: In addition to prominent epidural fat, there is prominent paraspinal fat. No focal mass lesion or abnormal enhancement. Known pulmonary nodule posteriorly in the left upper lobe is faintly visible on the axial images. Disc levels: Degenerative changes are present, greatest at C6-7, C7-T1 and C5-6. No disc herniation or high-grade foraminal narrowing demonstrated. MRI LUMBAR SPINE FINDINGS Segmentation:  Normal. Alignment:  Minimal scoliosis, otherwise normal. Vertebrae: There is widespread osseous metastatic disease  with heterogeneous enhancement following contrast. No pathologic fracture or epidural tumor seen. Conus medullaris: Extends to the L1 level and appears normal. No abnormal intradural enhancement. There is prominent epidural fat, greatest inferiorly. Paraspinal and other soft tissues: No acute findings. There is grossly stable ectasia of the proximal abdominal aorta. Small renal cysts are present. Disc levels: No significant disc space findings at T12-L1 or L1-2. L2-3: Advanced disc degeneration with desiccation, loss of height and vacuum phenomenon. There is a left-sided disc herniation with foraminal and extraforaminal components. In addition, there is an extruded disc fragment containing vacuum phenomenon extending into the left L3 lateral recess. These factors superimposed on congenitally short pedicles contribute to mild-to-moderate spinal stenosis with asymmetric narrowing of the left lateral recess and possible left L3 nerve root encroachment. There is mild narrowing of the left foramen. L3-4: There is loss of disc height with vacuum phenomenon and annular disc bulging. There is moderate facet and ligamentous hypertrophy. These factors superimposed on congenitally short pedicles contribute to moderate spinal stenosis. Both lateral recesses are mildly narrowed. The foramina appear sufficiently patent. L4-5: Chronic degenerative disc disease with loss of disc height, vacuum phenomenon and annular disc bulging. In correlation with prior CT, there are posterior osteophytes asymmetric to the right and moderate facet and ligamentous hypertrophy. These factors contribute to moderate to severe spinal stenosis with asymmetric narrowing of the right lateral recess. Mild foraminal narrowing is present, right greater than left. L5-S1: Chronic degenerative disc disease with loss of disc height and endplate osteophytes. Mild facet hypertrophy. Mild foraminal narrowing, left greater than right. IMPRESSION: 1. Widespread  osseous metastatic disease, similar to CT of 3 months ago. No pathologic fracture or epidural tumor seen. 2. Underlying congenital spinal stenosis with epidural lipomatosis, the latter greatest in the midthoracic and lower lumbar regions. 3. No cord compression or abnormal intradural enhancement. 4. Chronic multilevel lumbar spondylosis contributing to spinal stenosis, greatest at L4-5. There is associated narrowing of the lateral recesses and foramina, as detailed above. No suspected acute findings. Electronically Signed  By: Richardean Sale M.D.   On: 04/06/2017 17:13   Mr Lumbar Spine W Wo Contrast  Result Date: 04/06/2017 CLINICAL DATA:  Low back pain radiating into both hips for approximately 4 months. No acute injury or prior relevant surgery. History of prostate cancer. EXAM: MRI THORACIC AND LUMBAR SPINE WITHOUT AND WITH CONTRAST TECHNIQUE: Multiplanar and multiecho pulse sequences of the thoracic and lumbar spine were obtained without and with intravenous contrast. CONTRAST:  30m MULTIHANCE GADOBENATE DIMEGLUMINE 529 MG/ML IV SOLN COMPARISON:  Whole body bone scan 12/22/2016. CTs of the chest, abdomen and pelvis 01/05/2017. FINDINGS: MRI THORACIC SPINE FINDINGS Alignment:  Stable and near anatomic. Vertebrae: As correlated with prior studies, there is diffuse blastic osseous metastatic disease throughout the visualized vertebral bodies and ribs. There is associated mild heterogeneous enhancement following contrast. No pathologic fracture or epidural tumor seen. Cord: There is prominent epidural fat, especially posteriorly in the midthoracic spine. The thoracic cord appears normal in signal and caliber. No abnormal intradural enhancement seen. Paraspinal and other soft tissues: In addition to prominent epidural fat, there is prominent paraspinal fat. No focal mass lesion or abnormal enhancement. Known pulmonary nodule posteriorly in the left upper lobe is faintly visible on the axial images. Disc  levels: Degenerative changes are present, greatest at C6-7, C7-T1 and C5-6. No disc herniation or high-grade foraminal narrowing demonstrated. MRI LUMBAR SPINE FINDINGS Segmentation:  Normal. Alignment:  Minimal scoliosis, otherwise normal. Vertebrae: There is widespread osseous metastatic disease with heterogeneous enhancement following contrast. No pathologic fracture or epidural tumor seen. Conus medullaris: Extends to the L1 level and appears normal. No abnormal intradural enhancement. There is prominent epidural fat, greatest inferiorly. Paraspinal and other soft tissues: No acute findings. There is grossly stable ectasia of the proximal abdominal aorta. Small renal cysts are present. Disc levels: No significant disc space findings at T12-L1 or L1-2. L2-3: Advanced disc degeneration with desiccation, loss of height and vacuum phenomenon. There is a left-sided disc herniation with foraminal and extraforaminal components. In addition, there is an extruded disc fragment containing vacuum phenomenon extending into the left L3 lateral recess. These factors superimposed on congenitally short pedicles contribute to mild-to-moderate spinal stenosis with asymmetric narrowing of the left lateral recess and possible left L3 nerve root encroachment. There is mild narrowing of the left foramen. L3-4: There is loss of disc height with vacuum phenomenon and annular disc bulging. There is moderate facet and ligamentous hypertrophy. These factors superimposed on congenitally short pedicles contribute to moderate spinal stenosis. Both lateral recesses are mildly narrowed. The foramina appear sufficiently patent. L4-5: Chronic degenerative disc disease with loss of disc height, vacuum phenomenon and annular disc bulging. In correlation with prior CT, there are posterior osteophytes asymmetric to the right and moderate facet and ligamentous hypertrophy. These factors contribute to moderate to severe spinal stenosis with asymmetric  narrowing of the right lateral recess. Mild foraminal narrowing is present, right greater than left. L5-S1: Chronic degenerative disc disease with loss of disc height and endplate osteophytes. Mild facet hypertrophy. Mild foraminal narrowing, left greater than right. IMPRESSION: 1. Widespread osseous metastatic disease, similar to CT of 3 months ago. No pathologic fracture or epidural tumor seen. 2. Underlying congenital spinal stenosis with epidural lipomatosis, the latter greatest in the midthoracic and lower lumbar regions. 3. No cord compression or abnormal intradural enhancement. 4. Chronic multilevel lumbar spondylosis contributing to spinal stenosis, greatest at L4-5. There is associated narrowing of the lateral recesses and foramina, as detailed above. No suspected acute findings.  Electronically Signed   By: Richardean Sale M.D.   On: 04/06/2017 17:13     Assessment and plan- Patient is a 71 y.o. male with castrate sensitive prostate cancer with bone only mets on zytiga and lupron  Patients PSA has remained low however his alk phos has not improved significantly which is concerning that he is not having a good response to zytiga. We have asked him to bring his bottle of zytiga next time he comes to ensure compliance. He has been on zytiga for 3 months now. We will plan to repeat CT chest abdomen pelvis and bone scan 1st week of December. I will discuss his case and images at tumor board and see him on 05/17/17 or 05/18/17 with cbc, cmp and PSA. He will also get lupron that day.   Anemia of chronic disease- stable between 10-11  Given his falls and social situation- I have made a referral to home palliative care   Visit Diagnosis 1. Prostate cancer metastatic to bone [facility])      Dr. Randa Evens, MD, MPH [facility] Pager- [phone] 04/27/2017 2:59 PM

## 2017-04-30 ENCOUNTER — Other Ambulatory Visit: Payer: Self-pay | Admitting: *Deleted

## 2017-04-30 ENCOUNTER — Encounter: Payer: Self-pay | Admitting: Oncology

## 2017-05-01 ENCOUNTER — Other Ambulatory Visit: Payer: Self-pay | Admitting: *Deleted

## 2017-05-01 NOTE — Progress Notes (Signed)
Faxed referral to [facility] Pall[facility] for in home assessment per Dr. Elroy Channel request.     dhs

## 2017-05-02 MED FILL — ZYTIGA 250 MG TABLET: 250 | 30 days supply | Qty: 120 | Fill #2

## 2017-05-09 ENCOUNTER — Telehealth: Payer: Self-pay | Admitting: *Deleted

## 2017-05-09 NOTE — Telephone Encounter (Signed)
Palliative care called to report that patient refused services

## 2017-05-12 DIAGNOSIS — R3129 Other microscopic hematuria: Secondary | ICD-10-CM | POA: Insufficient documentation

## 2017-05-12 DIAGNOSIS — I2510 Atherosclerotic heart disease of native coronary artery without angina pectoris: Secondary | ICD-10-CM | POA: Diagnosis not present

## 2017-05-12 DIAGNOSIS — I10 Essential (primary) hypertension: Secondary | ICD-10-CM | POA: Diagnosis not present

## 2017-05-12 DIAGNOSIS — Z79899 Other long term (current) drug therapy: Secondary | ICD-10-CM | POA: Insufficient documentation

## 2017-05-12 DIAGNOSIS — J449 Chronic obstructive pulmonary disease, unspecified: Secondary | ICD-10-CM | POA: Insufficient documentation

## 2017-05-12 DIAGNOSIS — J45909 Unspecified asthma, uncomplicated: Secondary | ICD-10-CM | POA: Diagnosis not present

## 2017-05-12 DIAGNOSIS — Z7984 Long term (current) use of oral hypoglycemic drugs: Secondary | ICD-10-CM | POA: Insufficient documentation

## 2017-05-12 DIAGNOSIS — R3911 Hesitancy of micturition: Secondary | ICD-10-CM | POA: Diagnosis not present

## 2017-05-12 DIAGNOSIS — Z8546 Personal history of malignant neoplasm of prostate: Secondary | ICD-10-CM | POA: Diagnosis not present

## 2017-05-12 DIAGNOSIS — Z7982 Long term (current) use of aspirin: Secondary | ICD-10-CM | POA: Insufficient documentation

## 2017-05-12 DIAGNOSIS — F17200 Nicotine dependence, unspecified, uncomplicated: Secondary | ICD-10-CM | POA: Insufficient documentation

## 2017-05-12 DIAGNOSIS — E119 Type 2 diabetes mellitus without complications: Secondary | ICD-10-CM | POA: Insufficient documentation

## 2017-05-12 DIAGNOSIS — R339 Retention of urine, unspecified: Secondary | ICD-10-CM | POA: Diagnosis present

## 2017-05-12 LAB — URINALYSIS, COMPLETE (UACMP) WITH MICROSCOPIC
BACTERIA UA: NONE SEEN
BILIRUBIN URINE: NEGATIVE
Glucose, UA: NEGATIVE mg/dL
KETONES UR: NEGATIVE mg/dL
Leukocytes, UA: NEGATIVE
NITRITE: NEGATIVE
PH: 5 (ref 5.0–8.0)
Protein, ur: 100 mg/dL — AB
SQUAMOUS EPITHELIAL / LPF: NONE SEEN
Specific Gravity, Urine: 1.02 (ref 1.005–1.030)

## 2017-05-12 NOTE — ED Notes (Signed)
Pt with 107mL in bladder on urinary scan.

## 2017-05-12 NOTE — ED Triage Notes (Signed)
Patient ambulating in the lobby in no acute distress at this time.

## 2017-05-12 NOTE — ED Triage Notes (Signed)
Pt states he has not been able to urinate since this afternoon. Pt complains of pelvic pain/pressure. Pt appears in no acute distress. Pt denies nausea, fever.

## 2017-05-13 ENCOUNTER — Emergency Department
Admission: EM | Admit: 2017-05-13 | Discharge: 2017-05-13 | Disposition: A | Discharge status: Home / Self Care | Payer: Medicare Other | Attending: Emergency Medicine | Admitting: Emergency Medicine

## 2017-05-13 DIAGNOSIS — R3911 Hesitancy of micturition: Secondary | ICD-10-CM | POA: Diagnosis not present

## 2017-05-13 DIAGNOSIS — R3129 Other microscopic hematuria: Secondary | ICD-10-CM

## 2017-05-13 LAB — BASIC METABOLIC PANEL
Anion gap: 9 (ref 5–15)
BUN: 25 mg/dL — AB (ref 6–20)
CALCIUM: 9 mg/dL (ref 8.9–10.3)
CO2: 23 mmol/L (ref 22–32)
CREATININE: 0.89 mg/dL (ref 0.61–1.24)
Chloride: 109 mmol/L (ref 101–111)
GFR calc non Af Amer: >60 mL/min (ref >60)
GLUCOSE: 74 mg/dL (ref 65–99)
Potassium: 4.3 mmol/L (ref 3.5–5.1)
Sodium: 141 mmol/L (ref 135–145)

## 2017-05-13 NOTE — ED Provider Notes (Signed)
[facility] Emergency Department Provider Note  ____________________________________________   First MD Initiated Contact with Patient 05/13/17 0207     (approximate)  I have reviewed the triage vital signs and the nursing notes.   HISTORY  Chief Complaint Urinary Retention    HPI Michael Mathews is a 71 y.o. male history that includes prostate cancer and urinary retention which previously required a Foley catheter and leg bag for 2 weeks.  He has an appointment with his urologist in about 3 days but he presents tonight for gradually worsening urinary hesitancy and bladder over the last week.  Nothing makes his symptoms better or worse.  He is uncertain if he takes Flomax but according to his medical history he does.  He denies any pain or swelling in his abdomen.  He does feel a sensation of pressure and urinary urgency but it is difficult for him to empty his bladder.  He was able to urinate in the emergency department and provide a sample.  He is ambulating without any difficulty and repeatedly denies pain.  He states that when he was here before and needed a catheter the pain was severe but it is nothing like it now, he is just concerned because of the difficulty with urination.  His bladder scan in triage showed only 82 mL.  He has had some episodes of sweating recently where he feels very warm but no known fever.  He denies chest pain or shortness of breath.  He has been eating and drinking normally.  Past Medical History:  Diagnosis Date  . Arthritis   . Asthma   . Cancer [facility])    prostate  . COPD (chronic obstructive pulmonary disease) ([place])   . Coronary artery disease   . Cough    chronic  . Diabetes mellitus without complication ([place])   . Edema   . Elevated PSA   . Headache   . Hypertension   . Orthopnea   . Oxygen deficit    o2 prn  . Shortness of breath dyspnea   . Sleep apnea    cpap    Patient Active Problem List   Diagnosis Date  Noted  . Chest pain 01/19/2017  . 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 Goals of care, counseling/discussion 07/28/2016  . Elevated PSA 05/01/2016  . Urinary retention 05/01/2016  . Pneumonia 03/23/2016  . ARF (acute renal failure) ([place]) 03/11/2016  . Dizziness 03/03/2016  . Mixed hyperlipidemia 09/01/2015  . OSA (obstructive sleep apnea) 09/01/2015  . Atherosclerotic peripheral vascular disease with intermittent claudication ([place]) 04/16/2015  . Lumbar radiculitis 08/13/2014  . Lumbar stenosis with neurogenic claudication 08/13/2014  . Cardiomyopathy ([place]) 01/23/2013  . DOE (dyspnea on exertion) 01/23/2013  . Hyperlipidemia 01/23/2013  . PVC (premature ventricular contraction) 01/23/2013  . Nonspecific elevation of levels of transaminase and lactic acid dehydrogenase (ldh) 07/21/2011  . Type 2 diabetes mellitus without complications ([place]) 40/98/1191  . Sciatica 06/28/2010  . Chronic obstructive pulmonary disease ([place]) 12/02/2008  . Essential (primary) hypertension 09/05/2007  . Personal history of transient ischemic attack (TIA), and cerebral infarction without residual deficits 09/05/2007    Past Surgical History:  Procedure Laterality Date  . CARDIAC CATHETERIZATION  2014  . COLONOSCOPY    . CYSTOSCOPY W/ RETROGRADES Bilateral 10/18/2016   Procedure: CYSTOSCOPY WITH RETROGRADE PYELOGRAM;  Surgeon: Hollice Espy, MD;  Location: [facility] ORS;  Service: Urology;  Laterality: Bilateral;  . EYE SURGERY    . KNEE ARTHROSCOPY    .  TRANSURETHRAL RESECTION OF BLADDER TUMOR N/A 10/18/2016   Procedure: TRANSURETHRAL RESECTION OF BLADDER TUMOR (TURBT);  Surgeon: Hollice Espy, MD;  Location: [facility] ORS;  Service: Urology;  Laterality: N/A;  . TRANSURETHRAL RESECTION OF PROSTATE N/A 10/18/2016   Procedure: TRANSURETHRAL RESECTION OF THE PROSTATE (TURP) CHANNEL TURP;  Surgeon: Hollice Espy, MD;  Location: [facility] ORS;  Service: Urology;  Laterality: N/A;    Prior to Admission  medications   Medication Sig Start Date End Date Taking? Authorizing Provider  abiraterone Acetate (ZYTIGA) 250 MG tablet Take 4 tablets (1,000 mg total) by mouth daily. Take on an empty stomach 1 hour before or 2 hours after a meal Patient not taking: Reported on 04/27/2017 03/12/17   Sindy Guadeloupe, MD  aspirin EC 325 MG tablet Take 325 mg by mouth every morning.    [provider]  atorvastatin (LIPITOR) 40 MG tablet Take 1 tablet (40 mg total) by mouth daily. 03/24/16   Bettey Costa, MD  cloNIDine (CATAPRES) 0.2 MG tablet Take 1 tablet (0.2 mg total) by mouth 2 (two) times daily. 01/19/17   Gladstone Lighter, MD  DULoxetine (CYMBALTA) 30 MG capsule Take 30 mg by mouth daily. 01/04/16   [provider]  ferrous sulfate 325 (65 FE) MG EC tablet Take 1 tablet (325 mg total) by mouth 2 (two) times daily with a meal. 07/28/16   Sindy Guadeloupe, MD  finasteride (PROSCAR) 5 MG tablet Take 5 mg by mouth daily.    [provider]  Fluticasone-Salmeterol (ADVAIR) 250-50 MCG/DOSE AEPB Inhale 1 puff into the lungs 2 (two) times daily.    [provider]  GLIPIZIDE XL 10 MG 24 hr tablet  04/26/17   [provider]  hydrALAZINE (APRESOLINE) 100 MG tablet Take 100 mg by mouth 3 (three) times daily.  05/31/16   [provider]  ketoconazole (NIZORAL) 2 % cream  04/03/17   [provider]  losartan (COZAAR) 100 MG tablet Take 100 mg by mouth daily.    [provider]  metFORMIN (GLUCOPHAGE) 500 MG tablet Take 500 mg by mouth 2 (two) times daily with a meal.    [provider]  metoprolol (LOPRESSOR) 50 MG tablet Take 50 mg by mouth 2 (two) times daily.    [provider]  predniSONE (DELTASONE) 5 MG tablet Take 1 tablet (5 mg total) 2 (two) times daily with a meal by mouth. 04/17/17   Sindy Guadeloupe, MD  tamsulosin (FLOMAX) 0.4 MG CAPS capsule Take 1 capsule (0.4 mg total) by mouth daily. 05/29/16   [name], MD  vitamin  B-12 (CYANOCOBALAMIN) 1000 MCG tablet Take 1 tablet (1,000 mcg total) by mouth daily. 07/28/16   Sindy Guadeloupe, MD    Allergies Patient has no known allergies.  Family History  Problem Relation Age of Onset  . CVA Mother   . CAD Father     Social History Social History   Tobacco Use  . Smoking status: Current Every Day Smoker    Packs/day: 0.50    Years: 55.00    Pack years: 27.50  . Smokeless tobacco: Never Used  Substance Use Topics  . Alcohol use: No  . Drug use: No    Review of Systems Constitutional: "Sweating episodes", no known fever Cardiovascular: Denies chest pain. Respiratory: Denies shortness of breath. Gastrointestinal: No abdominal pain.  No nausea, no vomiting.  No diarrhea.  No constipation. Genitourinary: Difficulty urinating in spite of urinary urgency, difficulty voiding completely.  No dysuria,  no hematuria. Musculoskeletal: Negative for neck pain.  Negative for back pain. Integumentary: Negative for rash. Neurological: Negative for headaches, focal weakness or numbness.   ____________________________________________   PHYSICAL EXAM:  VITAL SIGNS: ED Triage Vitals  Enc Vitals Group     BP 05/12/17 2152 110/66     Pulse Rate 05/12/17 2151 63     Resp 05/12/17 2151 16     Temp 05/12/17 2151 97.9 F (36.6 C)     Temp Source 05/12/17 2151 Oral     SpO2 05/12/17 2151 97 %     Weight 05/12/17 2151 101.6 kg (224 lb)     Height 05/12/17 2151 1.778 m (5\' 10" )     Head Circumference --      Peak Flow --      Pain Score 05/12/17 2151 4     Pain Loc --      Pain Edu? --      Excl. in [place]? --     Constitutional: Alert and oriented. Well appearing and in no acute distress. Eyes: Conjunctivae are normal.  Cardiovascular: Normal rate, regular rhythm. Good peripheral circulation. Respiratory: Normal respiratory effort.  No retractions.  Gastrointestinal: Soft and nontender. No distention.  Neurologic:  Normal speech and language. No gross focal  neurologic deficits are appreciated.  Skin:  Skin is warm, dry and intact. No rash noted. Psychiatric: Mood and affect are normal. Speech and behavior are normal.  ____________________________________________   LABS (all labs ordered are listed, but only abnormal results are displayed)  Labs Reviewed  URINALYSIS, COMPLETE (UACMP) WITH MICROSCOPIC - Abnormal; Notable for the following components:      Result Value   Color, Urine YELLOW (*)    APPearance HAZY (*)    Hgb urine dipstick SMALL (*)    Protein, ur 100 (*)    All other components within normal limits  BASIC METABOLIC PANEL - Abnormal; Notable for the following components:   BUN 25 (*)    All other components within normal limits   ____________________________________________  EKG  None - EKG not ordered by ED physician ____________________________________________  RADIOLOGY   No results found.  ____________________________________________   PROCEDURES  Critical Care performed: No   Procedure(s) performed:   Procedures   ____________________________________________   INITIAL IMPRESSION / ASSESSMENT AND PLAN / ED COURSE  As part of my medical decision making, I reviewed the following data within the [place] notes reviewed and incorporated, Labs reviewed  and Notes from prior ED visits    Differential diagnosis includes, but is not limited to, urinary hesitancy secondary to BPH and/or prostate cancer, urinary obstruction, renal insufficiency/failure, etc.  However the patient is in no pain he was able to urinate for [place] and has a very low volume of urine on bladder scan.  I will check a metabolic panel to make sure his kidney function is appropriate but the patient is comfortable with following up in a couple of days with a previously scheduled appointment at his urologist's office.  I offered him an indwelling Foley catheter if he felt it would be helpful and he does not want  one at this time stating that the symptoms are nowhere near as bad as they were the time he had to have a Foley.    Clinical Course as of May 13 412  Sun May 13, 2017  6440 Reassuring BMP, no evidence of renal dysfunction.  Will d/c as per prior discussion and plan for close follow  up with urology.  Gave usual/customary return precautions.  [CF]    Clinical Course User Index [CF] Hinda Kehr, MD    ____________________________________________  FINAL CLINICAL IMPRESSION(S) / ED DIAGNOSES  Final diagnoses:  Urinary hesitancy  Microscopic hematuria     MEDICATIONS GIVEN DURING THIS VISIT:  Medications - No data to display   ED Discharge Orders    None       Note:  This document was prepared using Dragon voice recognition software and may include unintentional dictation errors.    Hinda Kehr, MD 05/13/17 [phone]

## 2017-05-13 NOTE — Discharge Instructions (Signed)
Your workup in the Emergency Department today was reassuring.  We did not find any specific abnormalities.  We recommend you drink plenty of fluids, take your regular medications (including your Flomax) and/or any new ones prescribed today, and follow up with the doctor(s) listed in these documents as recommended.  Return to the Emergency Department if you develop new or worsening symptoms that concern you.

## 2017-05-13 NOTE — ED Notes (Signed)
Pt stuck x 2 by this RN and x 1 by Kerrie Pleasure. Beth EDT obtained lab draw.

## 2017-05-15 ENCOUNTER — Encounter
Admission: RE | Admit: 2017-05-15 | Discharge: 2017-05-15 | Disposition: A | Discharge status: Home / Self Care | Payer: Medicare Other | Source: Ambulatory Visit | Attending: Oncology | Admitting: Oncology

## 2017-05-15 ENCOUNTER — Ambulatory Visit
Admission: RE | Admit: 2017-05-15 | Discharge: 2017-05-15 | Disposition: A | Discharge status: Home / Self Care | Payer: Medicare Other | Source: Ambulatory Visit | Attending: Oncology | Admitting: Oncology

## 2017-05-15 DIAGNOSIS — N329 Bladder disorder, unspecified: Secondary | ICD-10-CM | POA: Insufficient documentation

## 2017-05-15 DIAGNOSIS — R590 Localized enlarged lymph nodes: Secondary | ICD-10-CM | POA: Diagnosis not present

## 2017-05-15 DIAGNOSIS — R918 Other nonspecific abnormal finding of lung field: Secondary | ICD-10-CM | POA: Diagnosis not present

## 2017-05-15 DIAGNOSIS — I2510 Atherosclerotic heart disease of native coronary artery without angina pectoris: Secondary | ICD-10-CM | POA: Diagnosis not present

## 2017-05-15 DIAGNOSIS — I313 Pericardial effusion (noninflammatory): Secondary | ICD-10-CM | POA: Insufficient documentation

## 2017-05-15 DIAGNOSIS — I700 Atherosclerosis of aorta: Secondary | ICD-10-CM | POA: Diagnosis not present

## 2017-05-15 DIAGNOSIS — C7951 Secondary malignant neoplasm of bone: Principal | ICD-10-CM

## 2017-05-15 DIAGNOSIS — C61 Malignant neoplasm of prostate: Secondary | ICD-10-CM

## 2017-05-15 MED ORDER — TECHNETIUM TC 99M MEDRONATE IV KIT
22.0000 | PACK | Freq: Once | INTRAVENOUS | Status: AC | PRN
  Administered 2017-05-15: 21.872 via INTRAVENOUS

## 2017-05-15 MED ORDER — IOPAMIDOL (ISOVUE-300) INJECTION 61%
100.0000 mL | Freq: Once | INTRAVENOUS | Status: AC | PRN
  Administered 2017-05-15: 100 mL via INTRAVENOUS

## 2017-05-17 ENCOUNTER — Inpatient Hospital Stay: Payer: Medicare Other | Attending: Oncology | Admitting: Oncology

## 2017-05-17 ENCOUNTER — Inpatient Hospital Stay: Payer: Medicare Other

## 2017-05-17 ENCOUNTER — Ambulatory Visit
Admission: RE | Admit: 2017-05-17 | Discharge: 2017-05-17 | Disposition: A | Discharge status: Home / Self Care | Payer: Medicare Other | Source: Ambulatory Visit | Attending: Radiation Oncology | Admitting: Radiation Oncology

## 2017-05-17 ENCOUNTER — Encounter: Payer: Self-pay | Admitting: Oncology

## 2017-05-17 VITALS — BP 140/85 | HR 61 | Temp 97.6°F | Resp 18 | Ht 70.0 in | Wt 240.6 lb

## 2017-05-17 DIAGNOSIS — D649 Anemia, unspecified: Secondary | ICD-10-CM | POA: Insufficient documentation

## 2017-05-17 DIAGNOSIS — C61 Malignant neoplasm of prostate: Secondary | ICD-10-CM

## 2017-05-17 DIAGNOSIS — F17210 Nicotine dependence, cigarettes, uncomplicated: Secondary | ICD-10-CM | POA: Diagnosis not present

## 2017-05-17 DIAGNOSIS — I700 Atherosclerosis of aorta: Secondary | ICD-10-CM | POA: Insufficient documentation

## 2017-05-17 DIAGNOSIS — G4730 Sleep apnea, unspecified: Secondary | ICD-10-CM | POA: Diagnosis not present

## 2017-05-17 DIAGNOSIS — J449 Chronic obstructive pulmonary disease, unspecified: Secondary | ICD-10-CM | POA: Diagnosis not present

## 2017-05-17 DIAGNOSIS — Z79899 Other long term (current) drug therapy: Secondary | ICD-10-CM | POA: Diagnosis not present

## 2017-05-17 DIAGNOSIS — I10 Essential (primary) hypertension: Secondary | ICD-10-CM | POA: Insufficient documentation

## 2017-05-17 DIAGNOSIS — K4020 Bilateral inguinal hernia, without obstruction or gangrene, not specified as recurrent: Secondary | ICD-10-CM | POA: Diagnosis not present

## 2017-05-17 DIAGNOSIS — E119 Type 2 diabetes mellitus without complications: Secondary | ICD-10-CM | POA: Diagnosis not present

## 2017-05-17 DIAGNOSIS — I313 Pericardial effusion (noninflammatory): Secondary | ICD-10-CM | POA: Diagnosis not present

## 2017-05-17 DIAGNOSIS — C7951 Secondary malignant neoplasm of bone: Principal | ICD-10-CM

## 2017-05-17 DIAGNOSIS — I712 Thoracic aortic aneurysm, without rupture: Secondary | ICD-10-CM | POA: Insufficient documentation

## 2017-05-17 DIAGNOSIS — Z79818 Long term (current) use of other agents affecting estrogen receptors and estrogen levels: Secondary | ICD-10-CM | POA: Diagnosis not present

## 2017-05-17 LAB — COMPREHENSIVE METABOLIC PANEL
ALBUMIN: 3.6 g/dL (ref 3.5–5.0)
ALT: 15 U/L — ABNORMAL LOW (ref 17–63)
AST: 21 U/L (ref 15–41)
Alkaline Phosphatase: 969 U/L — ABNORMAL HIGH (ref 38–126)
Anion gap: 8 (ref 5–15)
BILIRUBIN TOTAL: 0.7 mg/dL (ref 0.3–1.2)
BUN: 30 mg/dL — AB (ref 6–20)
CO2: 29 mmol/L (ref 22–32)
CREATININE: 0.88 mg/dL (ref 0.61–1.24)
Calcium: 8.7 mg/dL — ABNORMAL LOW (ref 8.9–10.3)
Chloride: 101 mmol/L (ref 101–111)
GFR calc non Af Amer: >60 mL/min (ref >60)
GLUCOSE: 178 mg/dL — AB (ref 65–99)
POTASSIUM: 4.2 mmol/L (ref 3.5–5.1)
Sodium: 138 mmol/L (ref 135–145)
Total Protein: 7.4 g/dL (ref 6.5–8.1)

## 2017-05-17 LAB — PSA: Prostatic Specific Antigen: 0.8 ng/mL (ref 0.00–4.00)

## 2017-05-17 LAB — CBC WITH DIFFERENTIAL/PLATELET
Basophils Absolute: 0.1 10*3/uL (ref 0–0.1)
Basophils Relative: 1 %
EOS PCT: 1 %
Eosinophils Absolute: 0 10*3/uL (ref 0–0.7)
HCT: 33.2 % — ABNORMAL LOW (ref 40.0–52.0)
Hemoglobin: 11 g/dL — ABNORMAL LOW (ref 13.0–18.0)
LYMPHS ABS: 0.8 10*3/uL — AB (ref 1.0–3.6)
Lymphocytes Relative: 13 %
MCH: 32.7 pg (ref 26.0–34.0)
MCHC: 33.1 g/dL (ref 32.0–36.0)
MCV: 98.8 fL (ref 80.0–100.0)
MONOS PCT: 7 %
Monocytes Absolute: 0.4 10*3/uL (ref 0.2–1.0)
Neutro Abs: 5 10*3/uL (ref 1.4–6.5)
Neutrophils Relative %: 80 %
PLATELETS: 175 10*3/uL (ref 150–440)
RBC: 3.36 MIL/uL — ABNORMAL LOW (ref 4.40–5.90)
RDW: 14.9 % — AB (ref 11.5–14.5)
WBC: 6.3 10*3/uL (ref 3.8–10.6)

## 2017-05-17 MED ORDER — LEUPROLIDE ACETATE (3 MONTH) 22.5 MG IM KIT
22.5000 mg | PACK | Freq: Once | INTRAMUSCULAR | Status: AC
  Administered 2017-05-17: 22.5 mg via INTRAMUSCULAR

## 2017-05-17 NOTE — Consult Note (Signed)
NEW PATIENT EVALUATION  Name: Michael Mathews  MRN: [id number]  Date:   05/17/2017     DOB: [birth year]   This 71 y.o. 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  REFERRING PHYSICIAN: Aycock, Edmonia Lynch, MD  CHIEF COMPLAINT: No chief complaint on file.   DIAGNOSIS: There were no encounter diagnoses.   PREVIOUS INVESTIGATIONS:  CT scans and bone scan reviewed Pathology reports reviewed Clinical notes reviewed Case presented at weekly tumor conference  HPI: Patient is a 71 year old male who was first diagnosed in 2017 when CT scan incidentally showed sclerotic metastatic disease throughout his axial skeleton. He was noted to have an elevated PSA at 10.3 and bone scan January 2018 showed widespread metastatic disease to his axial skeleton. He has a small incidentally found spiculated 1.2 cm left upper lobe nodule concerning for malignancy. Patient had a TURP back in May 2018 showing prostate adenocarcinoma most consistent with a Gleason 9 (4+5). Patient social situation is.does not have a telephone a steady had no visceral metastasis he was eventually started on Zytiga with prednisone as well as Lupron. He does have anemia of chronic disease. Recently repeat bone scan on December 4 showed progression of disease as compared to his prior scan of 12/22/2016. This was also confirmed on repeat CT scan at the same time on December 4. Case was presented at our weekly tumor conference and my recommendation was to try macro X. Patient is seen today for consultation regarding regarding treatment. He does take some occasional anti-inflammatory medicine for pain is not on narcotics at this time. Interestingly his PSA remains low at 1.0 for 2 weeks prior which may reflect a poorly differentiated nature of his disease.  PLANNED TREATMENT REGIMEN: [place]  PAST MEDICAL HISTORY:  has a past medical history of Arthritis, Asthma, Cancer ([place]), COPD  (chronic obstructive pulmonary disease) ([place]), Coronary artery disease, Cough, Diabetes mellitus without complication ([place]), Edema, Elevated PSA, Headache, Hypertension, Orthopnea, Oxygen deficit, Prostate cancer ([place]), Shortness of breath dyspnea, and Sleep apnea.    PAST SURGICAL HISTORY:  Past Surgical History:  Procedure Laterality Date  . CARDIAC CATHETERIZATION  2014  . COLONOSCOPY    . CYSTOSCOPY W/ RETROGRADES Bilateral 10/18/2016   Procedure: CYSTOSCOPY WITH RETROGRADE PYELOGRAM;  Surgeon: Hollice Espy, MD;  Location: [facility] ORS;  Service: Urology;  Laterality: Bilateral;  . EYE SURGERY    . KNEE ARTHROSCOPY    . TRANSURETHRAL RESECTION OF BLADDER TUMOR N/A 10/18/2016   Procedure: TRANSURETHRAL RESECTION OF BLADDER TUMOR (TURBT);  Surgeon: Hollice Espy, MD;  Location: [facility] ORS;  Service: Urology;  Laterality: N/A;  . TRANSURETHRAL RESECTION OF PROSTATE N/A 10/18/2016   Procedure: TRANSURETHRAL RESECTION OF THE PROSTATE (TURP) CHANNEL TURP;  Surgeon: Hollice Espy, MD;  Location: [facility] ORS;  Service: Urology;  Laterality: N/A;    FAMILY HISTORY: family history includes CAD in his father; CVA in his mother.  SOCIAL HISTORY:  reports that he has been smoking.  He has a 27.50 pack-year smoking history. he has never used smokeless tobacco. He reports that he does not drink alcohol or use drugs.  ALLERGIES: Patient has no known allergies.  MEDICATIONS:  Current Outpatient Medications  Medication Sig Dispense Refill  . abiraterone Acetate (ZYTIGA) 250 MG tablet Take 4 tablets (1,000 mg total) by mouth daily. Take on an empty stomach 1 hour before or 2 hours after a meal 120 tablet 3  . aspirin EC 325 MG tablet Take 325 mg  by mouth every morning.    Marland Kitchen atorvastatin (LIPITOR) 40 MG tablet Take 1 tablet (40 mg total) by mouth daily. 30 tablet 0  . cloNIDine (CATAPRES) 0.2 MG tablet Take 1 tablet (0.2 mg total) by mouth 2 (two) times daily. 60 tablet 2  . DULoxetine (CYMBALTA) 30 MG capsule  Take 30 mg by mouth daily.    . ferrous sulfate 325 (65 FE) MG EC tablet Take 1 tablet (325 mg total) by mouth 2 (two) times daily with a meal. 60 tablet 3  . finasteride (PROSCAR) 5 MG tablet Take 5 mg by mouth daily.    . Fluticasone-Salmeterol (ADVAIR) 250-50 MCG/DOSE AEPB Inhale 1 puff into the lungs 2 (two) times daily.    Marland Kitchen GLIPIZIDE XL 10 MG 24 hr tablet     . hydrALAZINE (APRESOLINE) 100 MG tablet Take 100 mg by mouth 3 (three) times daily.     Marland Kitchen ketoconazole (NIZORAL) 2 % cream Apply 1 application topically daily as needed.     Marland Kitchen losartan (COZAAR) 100 MG tablet Take 100 mg by mouth daily.    . metFORMIN (GLUCOPHAGE) 500 MG tablet Take 500 mg by mouth 2 (two) times daily with a meal.    . metoprolol (LOPRESSOR) 50 MG tablet Take 50 mg by mouth 2 (two) times daily.    . predniSONE (DELTASONE) 5 MG tablet Take 1 tablet (5 mg total) 2 (two) times daily with a meal by mouth. 180 tablet 0  . tamsulosin (FLOMAX) 0.4 MG CAPS capsule Take 1 capsule (0.4 mg total) by mouth daily. 90 capsule 3  . vitamin B-12 (CYANOCOBALAMIN) 1000 MCG tablet Take 1 tablet (1,000 mcg total) by mouth daily. 30 tablet 3   No current facility-administered medications for this encounter.    Facility-Administered Medications Ordered in Other Encounters  Medication Dose Route Frequency Provider Last Rate Last Dose  . leuprolide (LUPRON) injection 22.5 mg  22.5 mg Intramuscular Once Sindy Guadeloupe, MD        ECOG PERFORMANCE STATUS:  1 - Symptomatic but completely ambulatory  REVIEW OF SYSTEMS:  Patient denies any weight loss, fatigue, weakness, fever, chills or night sweats. Patient denies any loss of vision, blurred vision. Patient denies any ringing  of the ears or hearing loss. No irregular heartbeat. Patient denies heart murmur or history of fainting. Patient denies any chest pain or pain radiating to her upper extremities. Patient denies any shortness of breath, difficulty breathing at night, cough or hemoptysis.  Patient denies any swelling in the lower legs. Patient denies any nausea vomiting, vomiting of blood, or coffee ground material in the vomitus. Patient denies any stomach pain. Patient states has had normal bowel movements no significant constipation or diarrhea. Patient denies any dysuria, hematuria or significant nocturia. Patient denies any problems walking, swelling in the joints or loss of balance. Patient denies any skin changes, loss of hair or loss of weight. Patient denies any excessive worrying or anxiety or significant depression. Patient denies any problems with insomnia. Patient denies excessive thirst, polyuria, polydipsia. Patient denies any swollen glands, patient denies easy bruising or easy bleeding. Patient denies any recent infections, allergies or URI. Patient "s visual fields have not changed significantly in recent time.    PHYSICAL EXAM: There were no vitals taken for this visit. Deep palpation of the spine does not elicit pain range of motion his of his upper lower extremities does not elicit pain. Well-developed well-nourished patient in NAD. HEENT reveals PERLA, EOMI, discs not visualized.  Oral cavity is clear. No oral mucosal lesions are identified. Neck is clear without evidence of cervical or supraclavicular adenopathy. Lungs are clear to A&P. Cardiac examination is essentially unremarkable with regular rate and rhythm without murmur rub or thrill. Abdomen is benign with no organomegaly or masses noted. Motor sensory and DTR levels are equal and symmetric in the upper and lower extremities. Cranial nerves II through XII are grossly intact. Proprioception is intact. No peripheral adenopathy or edema is identified. No motor or sensory levels are noted. Crude visual fields are within normal range.  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 Would plan on 6 infusions over 6 months. Risks and benefits of treatment including allergic reaction possible bone marrow suppression fatigue slight chance of nausea diarrhea all were explained in detail to the patient. We will schedule and make arrangements with nuclear medicine for his first infusion and blood counts prior to treatments. Patient can be reached through Dr. Larita Fife nurse Judeen Hammans and all medication with him will be directed through her. Again case was discussed at our tumor conference and I personally discussed with medical oncology. Do not see anything at CT scan at this time with which which would require external beam radiation therapy for palliation. Patient seems to comprehend my treatment plan well.  I would like to take this opportunity to thank you for allowing me to participate in the care of your patient.Armstead Peaks., MD

## 2017-05-17 NOTE — Progress Notes (Signed)
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 patient is 71 year old male with a past medical history significant for hypertension diabetes and COPD among other medical problems. He was admitted to the hospital in October 2017 with some symptoms of dizziness and abdominal pain. CT angiogram abdomen incidentally showed sclerotic metastatic disease throughout the visualized spine.   2. This was followed by an MRI of the lumbar spine which showed multifocal T1 and T2 hypointense lesions with areas of increased signal on STIR sequence are seen in multiple vertebral bodies. Largest lesions are in T11 T12 L1 and in the imaged sacrum. This is consistent with metastatic disease. Primary lesion is not identified. MRI brain showed no acute intracranial abnormality.   3. Patient was noted to have an elevated PSA and was referred to urology as an outpatient. He was recently seen by Dr. Tresa Moore on 05/29/2016. PSA was repeated at that time which came back elevated at 10.3. 3 months over the value was 10.81.  4. Bone scan on 07/07/16 showed : Widespread radiotracer uptake over the axial and appendicular skeleton as described compatible with metastatic disease and correlating with sclerotic lesions on Ct. CT chest on 07/07/16 showed: Scattered sclerotic osseous metastatic lesions throughout spine, pelvis, proximal femora, ribs, and manubrium. Prostatic enlargement with thickened bladder wall question related to chronic bladder outlet obstruction.  Spiculated 12 x 11 x 7 mm LEFT upper lobe nodule question primary  pulmonary neoplasm. Stable nonspecific small LEFT adrenal nodule. Single minimally enlarged AP window lymph node. BILATERAL inguinal hernias containing fat. 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 Recommend annual imaging followup by CTA or MRA.   5. Anemia work up from 06/20/16 was as follows: CBC showed white count of 5.2, H&H of 8.1/25 and a platelet count of 164. CMP showed elevated alkaline phosphatase of 391. Multiple myeloma panel showed normal quantitative immunoglobulins and no monoclonal M protein.Vitamin B12 level was low normal at 225. LDH was mildly elevated at 234. Ferritin was low normal at 27. Reticulocyte count was 2.4% inappropriately low for the degree of anemia. Haptoglobin was elevated at 273.  6. Patients case was discussed at tumor board and plan was to watch the lung nodule with scans as it was in a difficult area to biopsy. CT guided bone biopsy was obtained which showed metastatic prostate cancer for which he is on lupron  7. Given that he does not have visceral or lymph node metastasis, docetaxel/zytigawas not started. Also patient lives alone and does not have a good social support or means of communication. Those options to be consideredat progression.   8. Bone scan on 12/22/2016 showed significantly worsening osseous metastatic disease. Left upper lobe nodule was slightly smaller in size. Zytiga was added in setting of worsening bone disease along with prednisonein aug 2018. Lupron administered 02/13/2017.    Interval history-patient states that he feels achy especially in his low back and legs.  He has not had any recent falls.  He is not taking any pain medications presently.  ECOG PS- 1 Pain scale- 5   Review of systems-  Review of Systems  Constitutional: Positive for malaise/fatigue. Negative for chills, fever and weight loss.  HENT: Negative for  congestion, ear discharge and nosebleeds.   Eyes: Negative for blurred vision.  Respiratory: Negative for cough, hemoptysis, sputum production, shortness of breath and wheezing.   Cardiovascular: Negative for chest pain, palpitations, orthopnea and claudication.  Gastrointestinal: Negative for abdominal pain, blood in stool, constipation, diarrhea, heartburn, melena, nausea and vomiting.  Genitourinary: Negative for dysuria, flank pain, frequency, hematuria and urgency.  Musculoskeletal: Positive for back pain. Negative for joint pain and myalgias.  Skin: Negative for rash.  Neurological: Negative for dizziness, tingling, focal weakness, seizures, weakness and headaches.  Endo/Heme/Allergies: Does not bruise/bleed easily.  Psychiatric/Behavioral: Negative for depression and suicidal ideas. The patient does not have insomnia.        No Known Allergies   Past Medical History:  Diagnosis Date  . Arthritis   . Asthma   . Cancer [facility])    prostate  . COPD (chronic obstructive pulmonary disease) ([place])   . Coronary artery disease   . Cough    chronic  . Diabetes mellitus without complication ([place])   . Edema   . Elevated PSA   . Headache   . Hypertension   . Orthopnea   . Oxygen deficit    o2 prn  . Prostate cancer ([place])   . Shortness of breath dyspnea   . Sleep apnea    cpap     Past Surgical History:  Procedure Laterality Date  . CARDIAC CATHETERIZATION  2014  . COLONOSCOPY    . CYSTOSCOPY W/ RETROGRADES Bilateral 10/18/2016   Procedure: CYSTOSCOPY WITH RETROGRADE PYELOGRAM;  Surgeon: Hollice Espy, MD;  Location: [facility] ORS;  Service: Urology;  Laterality: Bilateral;  . EYE SURGERY    . KNEE ARTHROSCOPY    . TRANSURETHRAL RESECTION OF BLADDER TUMOR N/A 10/18/2016   Procedure: TRANSURETHRAL RESECTION OF BLADDER TUMOR (TURBT);  Surgeon: Hollice Espy, MD;  Location: [facility] ORS;  Service: Urology;  Laterality: N/A;  . TRANSURETHRAL RESECTION OF PROSTATE N/A 10/18/2016    Procedure: TRANSURETHRAL RESECTION OF THE PROSTATE (TURP) CHANNEL TURP;  Surgeon: Hollice Espy, MD;  Location: [facility] ORS;  Service: Urology;  Laterality: N/A;    Social History   Socioeconomic History  . Marital status: Single    Spouse name: Not on file  . Number of children: Not on file  . Years of education: Not on file  . Highest education level: Not on file  Social Needs  . Financial resource strain: Not on file  . Food insecurity - worry: Not on file  . Food insecurity - inability: Not on file  . Transportation needs - medical: Not on file  . Transportation needs - non-medical: Not on file  Occupational History  . Not on file  Tobacco Use  . Smoking status: Current Every Day Smoker    Packs/day: 0.50    Years: 55.00    Pack years: 27.50  . Smokeless tobacco: Never Used  Substance and Sexual Activity  . Alcohol use: No  . Drug use: No  . Sexual activity: Not on file  Other Topics Concern  . Not on file  Social History Narrative  . Not on file    Family History  Problem Relation Age of Onset  . CVA Mother   . CAD Father      Current Outpatient Medications:  .  abiraterone Acetate (ZYTIGA) 250 MG tablet, Take 4 tablets (1,000 mg total) by mouth daily. Take on  an empty stomach 1 hour before or 2 hours after a meal, Disp: 120 tablet, Rfl: 3 .  aspirin EC 325 MG tablet, Take 325 mg by mouth every morning., Disp: , Rfl:  .  atorvastatin (LIPITOR) 40 MG tablet, Take 1 tablet (40 mg total) by mouth daily., Disp: 30 tablet, Rfl: 0 .  cloNIDine (CATAPRES) 0.2 MG tablet, Take 1 tablet (0.2 mg total) by mouth 2 (two) times daily., Disp: 60 tablet, Rfl: 2 .  DULoxetine (CYMBALTA) 30 MG capsule, Take 30 mg by mouth daily., Disp: , Rfl:  .  ferrous sulfate 325 (65 FE) MG EC tablet, Take 1 tablet (325 mg total) by mouth 2 (two) times daily with a meal., Disp: 60 tablet, Rfl: 3 .  finasteride (PROSCAR) 5 MG tablet, Take 5 mg by mouth daily., Disp: , Rfl:  .   Fluticasone-Salmeterol (ADVAIR) 250-50 MCG/DOSE AEPB, Inhale 1 puff into the lungs 2 (two) times daily., Disp: , Rfl:  .  GLIPIZIDE XL 10 MG 24 hr tablet, , Disp: , Rfl:  .  hydrALAZINE (APRESOLINE) 100 MG tablet, Take 100 mg by mouth 3 (three) times daily. , Disp: , Rfl:  .  ketoconazole (NIZORAL) 2 % cream, Apply 1 application topically daily as needed. , Disp: , Rfl:  .  losartan (COZAAR) 100 MG tablet, Take 100 mg by mouth daily., Disp: , Rfl:  .  metFORMIN (GLUCOPHAGE) 500 MG tablet, Take 500 mg by mouth 2 (two) times daily with a meal., Disp: , Rfl:  .  metoprolol (LOPRESSOR) 50 MG tablet, Take 50 mg by mouth 2 (two) times daily., Disp: , Rfl:  .  predniSONE (DELTASONE) 5 MG tablet, Take 1 tablet (5 mg total) 2 (two) times daily with a meal by mouth., Disp: 180 tablet, Rfl: 0 .  tamsulosin (FLOMAX) 0.4 MG CAPS capsule, Take 1 capsule (0.4 mg total) by mouth daily., Disp: 90 capsule, Rfl: 3 .  vitamin B-12 (CYANOCOBALAMIN) 1000 MCG tablet, Take 1 tablet (1,000 mcg total) by mouth daily., Disp: 30 tablet, Rfl: 3  Physical exam:  Vitals:   05/17/17 1506  BP: 140/85  Pulse: 61  Resp: 18  Temp: 97.6 F (36.4 C)  TempSrc: Tympanic  Weight: 240 lb 9.6 oz (109.1 kg)  Height: _0  (1.778 m)   Physical Exam  Constitutional: He is oriented to person, place, and time and well-developed, well-nourished, and in no distress.  HENT:  Head: Normocephalic and atraumatic.  Eyes: EOM are normal. Pupils are equal, round, and reactive to light.  Neck: Normal range of motion.  Cardiovascular: Normal rate, regular rhythm and normal heart sounds.  Pulmonary/Chest: Effort normal and breath sounds normal.  Pursed lip breathing  Abdominal: Soft. Bowel sounds are normal.  Neurological: He is alert and oriented to person, place, and time.  Skin: Skin is warm and dry.     CMP Latest Ref Rng & Units 05/17/2017  Glucose 65 - 99 mg/dL 178(H)  BUN 6 - 20 mg/dL 30(H)  Creatinine 0.61 - 1.24 mg/dL 0.88    Sodium 135 - 145 mmol/L 138  Potassium 3.5 - 5.1 mmol/L 4.2  Chloride 101 - 111 mmol/L 101  CO2 22 - 32 mmol/L 29  Calcium 8.9 - 10.3 mg/dL 8.7(L)  Total Protein 6.5 - 8.1 g/dL 7.4  Total Bilirubin 0.3 - 1.2 mg/dL 0.7  Alkaline Phos 38 - 126 U/L 969(H)  AST 15 - 41 U/L 21  ALT 17 - 63 U/L 15(L)   CBC Latest Ref Rng &  Units 05/17/2017  WBC 3.8 - 10.6 K/uL 6.3  Hemoglobin 13.0 - 18.0 g/dL 11.0(L)  Hematocrit 40.0 - 52.0 % 33.2(L)  Platelets 150 - 440 K/uL 175    No images are attached to the encounter.  Ct Chest W Contrast  Result Date: 05/15/2017 CLINICAL DATA:  Urinary retention and urgency. History of prostate cancer diagnosed 5 months ago. EXAM: CT CHEST, ABDOMEN, AND PELVIS WITH CONTRAST TECHNIQUE: Multidetector CT imaging of the chest, abdomen and pelvis was performed following the standard protocol during bolus administration of intravenous contrast. CONTRAST:  125m ISOVUE-300 IOPAMIDOL (ISOVUE-300) INJECTION 61% COMPARISON:  Body CT 07/07/2016 FINDINGS: CT CHEST FINDINGS Cardiovascular: Normal heart size. Small pericardial effusion measuring 7 mm transversely anteriorly. Heavy calcific atherosclerotic disease of the coronary arteries and aorta. Stable fusiform dilation of the ascending aorta with maximum diameter of 4.2 cm. Mediastinum/Nodes: No enlarged hilar, or axillary lymph nodes. Thyroid gland, trachea, and esophagus demonstrate no significant findings. Previously noted enlarged AP window lymph node measures 14 mm in short axis, from prior measurement of 12 mm. Calcified sub pathologic right hilar and peribronchial lymph nodes. Lungs/Pleura: Tiny subpleural calcified nodules in the right lung are likely benign. Area of subtle ground-glass opacity in the posterior aspect of the right lower lobe, image 83/150, sequence 5, and image 94/150, sequence 5. Mild cylindrical bronchiectasis and mucous plugging in the bronchial tree of the left lower lobe and lingula. Spiculated perifissural  nodule in the left upper lobe has markedly decreased density and it appears ground-glass on today's exam with maximum measurement of 1.2 cm. Musculoskeletal: Increased number and prominence of numerous sclerotic osseous metastatic lesions throughout the spine, ribs and sternum. Marked increase in number of sclerotic lesions within the humeral heads, right greater than left, and scapulae. CT ABDOMEN PELVIS FINDINGS Hepatobiliary: No focal liver abnormality is seen. No gallstones, gallbladder wall thickening, or biliary dilatation. Pancreas: Unremarkable. No pancreatic ductal dilatation or surrounding inflammatory changes. Spleen: Normal in size without focal abnormality. Adrenals/Urinary Tract: Normal right adrenal gland. Stable indeterminate left adrenal nodule now measuring 16 mm in greatest dimension. No evidence of hydronephrosis or solid renal masses. Calcifications within the renal parenchyma are likely vascular. Mild nonspecific diffuse thickening of the urinary bladder wall. The urinary bladder is not abnormally distended. Stomach/Bowel: Stomach is within normal limits. Appendix appears normal. No evidence of bowel wall thickening, distention, or inflammatory changes. Vascular/Lymphatic: Aortic atherosclerosis. No enlarged abdominal or pelvic lymph nodes. There are however small round mesenteric lymph nodes in the central mesentery of the upper abdomen, with associated matting of the mesentery, involving passing proximal small bowel loops. The findings may be appreciated on images 75 to 81/135, sequence 2. These findings are located slightly caudal to the pancreas but appear separate from it. Reproductive: The prostate gland has decreased in size and now measures 2.3 cm in greatest dimension. Other: Bilateral fat containing inguinal hernias. No evidence of free fluid in the abdomen. Musculoskeletal: Marked advancement of skeletal metastatic disease with sclerotic lesions largely replacing the normal matrix of  all of the vertebral bodies, and affecting heavily the sacrum, pelvic bones, bilateral femurs. No evidence of pathologic fractures. IMPRESSION: Mild mucosal thickening of the urinary bladder, which however is not abnormally dilated. The prostate gland has decreased in size. Marked advancement of skeletal metastatic disease with sclerotic lesions largely replacing the normal matrix of all of the vertebral bodies, and affecting heavily the sacrum, pelvic bones, bilateral femurs, sternum, multiple ribs, proximal humeri and scapulae. Slightly enlarged 14 mm in  short axis AP window mediastinal lymph node, likely metastatic. Small round mesenteric lymph nodes in the central mesentery of the upper abdomen with associated matting of the mesentery, involving the passing proximal small bowel loops. Findings are suspicious for metastatic disease. No resultant small bowel obstruction. Interval decrease in density, but no significant change in size of the previously noted spiculated left upper lobe perifissural 1.2 cm pulmonary nodule. Interval development of nonspecific areas of ground-glass opacity within the posterior segment of right lower lobe. Small pericardial effusion. Calcific atherosclerotic disease of the coronary arteries and aorta. Electronically Signed   By: Fidela Salisbury M.D.   On: 05/15/2017 20:30   Nm Bone Scan Whole Body  Result Date: 05/16/2017 CLINICAL DATA:  Prostate malignancy metastatic to bone status post systemic therapy. SS response. EXAM: NUCLEAR MEDICINE WHOLE BODY BONE SCAN TECHNIQUE: Whole body anterior and posterior images were obtained approximately 3 hours after intravenous injection of radiopharmaceutical. RADIOPHARMACEUTICALS:  21.87 mCi Technetium-45mMDP IV COMPARISON:  Nuclear bone scan of December 22, 2016 FINDINGS: There is adequate uptake of the radiopharmaceutical by the skeleton. There is limited activity noted within the kidneys or bladder due to the sequestration of activity  within the bony structures. There are areas of increased uptake within the calvarium, spine, ribs, and pelvis which are stable to slightly more conspicuous overall. Uptake within the proximal femurs bilaterally is increased. Areas of uptake in the distal femurs are fairly stable. Increased uptake within the proximal humeri bilaterally is stable. IMPRESSION: Widespread bony metastatic disease that has progressed slightly since the previous study. Electronically Signed   By: David  JMartiniqueM.D.   On: 05/16/2017 08:38   Ct Abdomen Pelvis W Contrast  Result Date: 05/15/2017 CLINICAL DATA:  Urinary retention and urgency. History of prostate cancer diagnosed 5 months ago. EXAM: CT CHEST, ABDOMEN, AND PELVIS WITH CONTRAST TECHNIQUE: Multidetector CT imaging of the chest, abdomen and pelvis was performed following the standard protocol during bolus administration of intravenous contrast. CONTRAST:  1019mISOVUE-300 IOPAMIDOL (ISOVUE-300) INJECTION 61% COMPARISON:  Body CT 07/07/2016 FINDINGS: CT CHEST FINDINGS Cardiovascular: Normal heart size. Small pericardial effusion measuring 7 mm transversely anteriorly. Heavy calcific atherosclerotic disease of the coronary arteries and aorta. Stable fusiform dilation of the ascending aorta with maximum diameter of 4.2 cm. Mediastinum/Nodes: No enlarged hilar, or axillary lymph nodes. Thyroid gland, trachea, and esophagus demonstrate no significant findings. Previously noted enlarged AP window lymph node measures 14 mm in short axis, from prior measurement of 12 mm. Calcified sub pathologic right hilar and peribronchial lymph nodes. Lungs/Pleura: Tiny subpleural calcified nodules in the right lung are likely benign. Area of subtle ground-glass opacity in the posterior aspect of the right lower lobe, image 83/150, sequence 5, and image 94/150, sequence 5. Mild cylindrical bronchiectasis and mucous plugging in the bronchial tree of the left lower lobe and lingula. Spiculated  perifissural nodule in the left upper lobe has markedly decreased density and it appears ground-glass on today's exam with maximum measurement of 1.2 cm. Musculoskeletal: Increased number and prominence of numerous sclerotic osseous metastatic lesions throughout the spine, ribs and sternum. Marked increase in number of sclerotic lesions within the humeral heads, right greater than left, and scapulae. CT ABDOMEN PELVIS FINDINGS Hepatobiliary: No focal liver abnormality is seen. No gallstones, gallbladder wall thickening, or biliary dilatation. Pancreas: Unremarkable. No pancreatic ductal dilatation or surrounding inflammatory changes. Spleen: Normal in size without focal abnormality. Adrenals/Urinary Tract: Normal right adrenal gland. Stable indeterminate left adrenal nodule now measuring 16 mm in  greatest dimension. No evidence of hydronephrosis or solid renal masses. Calcifications within the renal parenchyma are likely vascular. Mild nonspecific diffuse thickening of the urinary bladder wall. The urinary bladder is not abnormally distended. Stomach/Bowel: Stomach is within normal limits. Appendix appears normal. No evidence of bowel wall thickening, distention, or inflammatory changes. Vascular/Lymphatic: Aortic atherosclerosis. No enlarged abdominal or pelvic lymph nodes. There are however small round mesenteric lymph nodes in the central mesentery of the upper abdomen, with associated matting of the mesentery, involving passing proximal small bowel loops. The findings may be appreciated on images 75 to 81/135, sequence 2. These findings are located slightly caudal to the pancreas but appear separate from it. Reproductive: The prostate gland has decreased in size and now measures 2.3 cm in greatest dimension. Other: Bilateral fat containing inguinal hernias. No evidence of free fluid in the abdomen. Musculoskeletal: Marked advancement of skeletal metastatic disease with sclerotic lesions largely replacing the  normal matrix of all of the vertebral bodies, and affecting heavily the sacrum, pelvic bones, bilateral femurs. No evidence of pathologic fractures. IMPRESSION: Mild mucosal thickening of the urinary bladder, which however is not abnormally dilated. The prostate gland has decreased in size. Marked advancement of skeletal metastatic disease with sclerotic lesions largely replacing the normal matrix of all of the vertebral bodies, and affecting heavily the sacrum, pelvic bones, bilateral femurs, sternum, multiple ribs, proximal humeri and scapulae. Slightly enlarged 14 mm in short axis AP window mediastinal lymph node, likely metastatic. Small round mesenteric lymph nodes in the central mesentery of the upper abdomen with associated matting of the mesentery, involving the passing proximal small bowel loops. Findings are suspicious for metastatic disease. No resultant small bowel obstruction. Interval decrease in density, but no significant change in size of the previously noted spiculated left upper lobe perifissural 1.2 cm pulmonary nodule. Interval development of nonspecific areas of ground-glass opacity within the posterior segment of right lower lobe. Small pericardial effusion. Calcific atherosclerotic disease of the coronary arteries and aorta. Electronically Signed   By: Fidela Salisbury M.D.   On: 05/15/2017 20:30     Assessment and plan- Patient is a 71 y.o. male with high-volume castrate resistant prostate cancer with bone metastases  I have personally reviewed patient's CT scan as well as bone scan.  We also reviewed his images at the tumor board today.  After being on Zytiga for 4 months now repeat bone scans do show progression of disease.  He therefore has castrate resistant disease.  His PSA continues to be suppressed suggestive that his prostate cancer does not produce significant amount of PSA and is largely independent of it.  Given that he has bone only metastatic disease it is worse on  Zytiga plan is to proceed with Xofigo at this time.  He will be seen by Dr. Donella Stade at this time to discuss risks and benefits of Xofigo and plan is to do this every 4 weeks for 6 months.  Since there is an increased risk of skeletal related fractures on Xofigo plus Zytiga I have asked him to hold his Zytiga at this time.  I will plan to get repeat scans in about 4 months time and if there is evidence of disease progression I will plan to switch him to chemotherapy at that time with docetaxel.    Patient will get his every 3 monthly Lupron today  He will get a repeat CBC in 1 month's time and I will see him back in 2 months with a repeat  CBC and CMP   Visit Diagnosis 1. Prostate cancer metastatic to bone [facility])      Dr. Randa Evens, MD, MPH [facility] Pager- [phone] 05/17/2017 4:08 PM

## 2017-05-17 NOTE — Progress Notes (Signed)
He is having pain in his hips, back, legs. Take and extra asa when it hurts him bad. Does not want pain med. Has troubel sleeping at night

## 2017-05-18 ENCOUNTER — Emergency Department: Payer: Medicare Other

## 2017-05-18 ENCOUNTER — Other Ambulatory Visit: Payer: Self-pay

## 2017-05-18 ENCOUNTER — Emergency Department
Admission: EM | Admit: 2017-05-18 | Discharge: 2017-05-18 | Disposition: A | Discharge status: Home / Self Care | Payer: Medicare Other | Attending: Emergency Medicine | Admitting: Emergency Medicine

## 2017-05-18 DIAGNOSIS — E119 Type 2 diabetes mellitus without complications: Secondary | ICD-10-CM | POA: Insufficient documentation

## 2017-05-18 DIAGNOSIS — Z7982 Long term (current) use of aspirin: Secondary | ICD-10-CM | POA: Diagnosis not present

## 2017-05-18 DIAGNOSIS — F17210 Nicotine dependence, cigarettes, uncomplicated: Secondary | ICD-10-CM | POA: Diagnosis not present

## 2017-05-18 DIAGNOSIS — Z79899 Other long term (current) drug therapy: Secondary | ICD-10-CM | POA: Diagnosis not present

## 2017-05-18 DIAGNOSIS — R079 Chest pain, unspecified: Secondary | ICD-10-CM | POA: Diagnosis present

## 2017-05-18 DIAGNOSIS — I2510 Atherosclerotic heart disease of native coronary artery without angina pectoris: Secondary | ICD-10-CM | POA: Diagnosis not present

## 2017-05-18 DIAGNOSIS — I10 Essential (primary) hypertension: Secondary | ICD-10-CM | POA: Insufficient documentation

## 2017-05-18 DIAGNOSIS — C7951 Secondary malignant neoplasm of bone: Secondary | ICD-10-CM | POA: Diagnosis not present

## 2017-05-18 DIAGNOSIS — Z7984 Long term (current) use of oral hypoglycemic drugs: Secondary | ICD-10-CM | POA: Insufficient documentation

## 2017-05-18 DIAGNOSIS — J449 Chronic obstructive pulmonary disease, unspecified: Secondary | ICD-10-CM | POA: Insufficient documentation

## 2017-05-18 DIAGNOSIS — Z8546 Personal history of malignant neoplasm of prostate: Secondary | ICD-10-CM | POA: Diagnosis not present

## 2017-05-18 LAB — CBC
HEMATOCRIT: 33.9 % — AB (ref 40.0–52.0)
HEMOGLOBIN: 11.3 g/dL — AB (ref 13.0–18.0)
MCH: 32.3 pg (ref 26.0–34.0)
MCHC: 33.4 g/dL (ref 32.0–36.0)
MCV: 96.9 fL (ref 80.0–100.0)
Platelets: 170 10*3/uL (ref 150–440)
RBC: 3.5 MIL/uL — AB (ref 4.40–5.90)
RDW: 14.9 % — ABNORMAL HIGH (ref 11.5–14.5)
WBC: 8.9 10*3/uL (ref 3.8–10.6)

## 2017-05-18 LAB — BASIC METABOLIC PANEL
ANION GAP: 11 (ref 5–15)
BUN: 25 mg/dL — ABNORMAL HIGH (ref 6–20)
CHLORIDE: 101 mmol/L (ref 101–111)
CO2: 29 mmol/L (ref 22–32)
Calcium: 9 mg/dL (ref 8.9–10.3)
Creatinine, Ser: 0.9 mg/dL (ref 0.61–1.24)
GFR calc non Af Amer: >60 mL/min (ref >60)
Glucose, Bld: 76 mg/dL (ref 65–99)
POTASSIUM: 3.6 mmol/L (ref 3.5–5.1)
SODIUM: 141 mmol/L (ref 135–145)

## 2017-05-18 LAB — TROPONIN I
Troponin I: 0.04 ng/mL (ref <0.03)
Troponin I: 0.04 ng/mL (ref <0.03)

## 2017-05-18 MED ORDER — OXYCODONE-ACETAMINOPHEN 5-325 MG PO TABS: 1.0000 | ORAL_TABLET | ORAL | 0 refills | Status: DC | PRN

## 2017-05-18 MED ORDER — HYDRALAZINE HCL 20 MG/ML IJ SOLN
INTRAMUSCULAR | Status: AC
  Filled 2017-05-18: qty 1

## 2017-05-18 MED ORDER — MORPHINE SULFATE (PF) 2 MG/ML IV SOLN
2.0000 mg | Freq: Once | INTRAVENOUS | Status: AC
  Administered 2017-05-18: 2 mg via INTRAVENOUS

## 2017-05-18 MED ORDER — MORPHINE SULFATE (PF) 4 MG/ML IV SOLN: 4.0000 mg | Freq: Once | INTRAVENOUS | Status: DC

## 2017-05-18 MED ORDER — HYDRALAZINE HCL 20 MG/ML IJ SOLN
5.0000 mg | Freq: Once | INTRAMUSCULAR | Status: AC
  Administered 2017-05-18: 5 mg via INTRAVENOUS

## 2017-05-18 MED ORDER — HYDROMORPHONE HCL 1 MG/ML IJ SOLN
1.0000 mg | Freq: Once | INTRAMUSCULAR | Status: AC
  Administered 2017-05-18: 1 mg via INTRAVENOUS
  Filled 2017-05-18: qty 1

## 2017-05-18 MED ORDER — MORPHINE SULFATE (PF) 2 MG/ML IV SOLN
INTRAVENOUS | Status: AC
  Filled 2017-05-18: qty 1

## 2017-05-18 NOTE — ED Triage Notes (Signed)
Patient c/o left chest pain radiating to left arm and back. Patient reports concurrent symptoms of SOB and weakness.

## 2017-05-18 NOTE — ED Notes (Signed)
Per MD Owens Shark, d/t pt driving self and IV narcotics, pt to be d/c after 0830.

## 2017-05-18 NOTE — ED Notes (Signed)
Date and time results received: 05/18/17 0356 (use smartphrase ".now" to insert current time)  Test: Troponin Critical Value: 0.04  Name of Provider Notified: Dr. Owens Shark  Orders Received? Or Actions Taken?:

## 2017-05-18 NOTE — ED Notes (Signed)
Per MD Owens Shark, hold 4mg  morphine at this time.

## 2017-05-18 NOTE — ED Notes (Signed)

## 2017-05-18 NOTE — ED Notes (Signed)
ED Provider at bedside. 

## 2017-05-18 NOTE — ED Provider Notes (Addendum)
[facility] Emergency Department Provider Note    First MD Initiated Contact with Patient 05/18/17 0244     (approximate)  I have reviewed the triage vital signs and the nursing notes.   HISTORY  Chief Complaint Chest Pain    HPI Michael Mathews is a 71 y.o. male below list of chronic medical conditions including metastatic prostate cancer presents to the emergency department with bilateral upper chest pain times 1 day which radiates to his back.  Patient also admits to intermittent dyspnea and generalized weakness.  Patient denies any fever.  Patient denies any cough.  Patient denies any lower external knee pain or swelling.  Patient denies any fever.   Past Medical History:  Diagnosis Date  . Arthritis   . Cancer [facility])    prostate  . COPD (chronic obstructive pulmonary disease) ([place])   . Coronary artery disease   . Cough    chronic  . Diabetes mellitus without complication ([place])   . Edema   . Elevated PSA   . Headache   . Hypertension   . Orthopnea   . Oxygen deficit    o2 prn  . Prostate cancer ([place])   . Shortness of breath dyspnea   . Sleep apnea    cpap    Patient Active Problem List   Diagnosis Date Noted  . Chest pain 01/19/2017  . Prostate cancer metastatic to bone ([place]) 07/28/2016  . Goals of care, counseling/discussion 07/28/2016  . Elevated PSA 05/01/2016  . Urinary retention 05/01/2016  . Pneumonia 03/23/2016  . ARF (acute renal failure) ([place]) 03/11/2016  . Dizziness 03/03/2016  . Mixed hyperlipidemia 09/01/2015  . OSA (obstructive sleep apnea) 09/01/2015  . Atherosclerotic peripheral vascular disease with intermittent claudication ([place]) 04/16/2015  . Lumbar radiculitis 08/13/2014  . Lumbar stenosis with neurogenic claudication 08/13/2014  . Cardiomyopathy ([place]) 01/23/2013  . DOE (dyspnea on exertion) 01/23/2013  . Hyperlipidemia 01/23/2013  . PVC (premature ventricular contraction) 01/23/2013  . Nonspecific elevation  of levels of transaminase and lactic acid dehydrogenase (ldh) 07/21/2011  . Type 2 diabetes mellitus without complications ([place]) 20/25/4270  . Sciatica 06/28/2010  . Chronic obstructive pulmonary disease ([place]) 12/02/2008  . Essential (primary) hypertension 09/05/2007  . Personal history of transient ischemic attack (TIA), and cerebral infarction without residual deficits 09/05/2007    Past Surgical History:  Procedure Laterality Date  . CARDIAC CATHETERIZATION  2014  . COLONOSCOPY    . CYSTOSCOPY W/ RETROGRADES Bilateral 10/18/2016   Procedure: CYSTOSCOPY WITH RETROGRADE PYELOGRAM;  Surgeon: Hollice Espy, MD;  Location: [facility] ORS;  Service: Urology;  Laterality: Bilateral;  . EYE SURGERY    . KNEE ARTHROSCOPY    . TRANSURETHRAL RESECTION OF BLADDER TUMOR N/A 10/18/2016   Procedure: TRANSURETHRAL RESECTION OF BLADDER TUMOR (TURBT);  Surgeon: Hollice Espy, MD;  Location: [facility] ORS;  Service: Urology;  Laterality: N/A;  . TRANSURETHRAL RESECTION OF PROSTATE N/A 10/18/2016   Procedure: TRANSURETHRAL RESECTION OF THE PROSTATE (TURP) CHANNEL TURP;  Surgeon: Hollice Espy, MD;  Location: [facility] ORS;  Service: Urology;  Laterality: N/A;    Prior to Admission medications   Medication Sig Start Date End Date Taking? Authorizing Provider  abiraterone Acetate (ZYTIGA) 250 MG tablet Take 4 tablets (1,000 mg total) by mouth daily. Take on an empty stomach 1 hour before or 2 hours after a meal 03/12/17   Sindy Guadeloupe, MD  aspirin EC 325 MG tablet Take 325 mg by mouth every morning.    [provider]  atorvastatin (LIPITOR) 40 MG tablet Take 1 tablet (40 mg total) by mouth daily. 03/24/16   Bettey Costa, MD  cloNIDine (CATAPRES) 0.2 MG tablet Take 1 tablet (0.2 mg total) by mouth 2 (two) times daily. 01/19/17   Gladstone Lighter, MD  DULoxetine (CYMBALTA) 30 MG capsule Take 30 mg by mouth daily. 01/04/16   [provider]  ferrous sulfate 325 (65 FE) MG EC tablet Take 1 tablet (325 mg  total) by mouth 2 (two) times daily with a meal. 07/28/16   Sindy Guadeloupe, MD  finasteride (PROSCAR) 5 MG tablet Take 5 mg by mouth daily.    [provider]  Fluticasone-Salmeterol (ADVAIR) 250-50 MCG/DOSE AEPB Inhale 1 puff into the lungs 2 (two) times daily.    [provider]  GLIPIZIDE XL 10 MG 24 hr tablet  04/26/17   [provider]  hydrALAZINE (APRESOLINE) 100 MG tablet Take 100 mg by mouth 3 (three) times daily.  05/31/16   [provider]  ketoconazole (NIZORAL) 2 % cream Apply 1 application topically daily as needed.  04/03/17   [provider]  losartan (COZAAR) 100 MG tablet Take 100 mg by mouth daily.    [provider]  metFORMIN (GLUCOPHAGE) 500 MG tablet Take 500 mg by mouth 2 (two) times daily with a meal.    [provider]  metoprolol (LOPRESSOR) 50 MG tablet Take 50 mg by mouth 2 (two) times daily.    [provider]  oxyCODONE-acetaminophen (ROXICET) 5-325 MG tablet Take 1 tablet by mouth every 4 (four) hours as needed for severe pain. 05/18/17   Gregor Hams, MD  predniSONE (DELTASONE) 5 MG tablet Take 1 tablet (5 mg total) 2 (two) times daily with a meal by mouth. 04/17/17   Sindy Guadeloupe, MD  tamsulosin (FLOMAX) 0.4 MG CAPS capsule Take 1 capsule (0.4 mg total) by mouth daily. 05/29/16   [name], MD  vitamin B-12 (CYANOCOBALAMIN) 1000 MCG tablet Take 1 tablet (1,000 mcg total) by mouth daily. 07/28/16   Sindy Guadeloupe, MD    Allergies No known drug allergies  Family History  Problem Relation Age of Onset  . CVA Mother   . CAD Father     Social History Social History   Tobacco Use  . Smoking status: Current Every Day Smoker    Packs/day: 0.50    Years: 55.00    Pack years: 27.50  . Smokeless tobacco: Never Used  Substance Use Topics  . Alcohol use: No  . Drug use: No    Review of Systems Constitutional: No fever/chills Eyes: No visual changes. ENT: No sore  throat. Cardiovascular: Positive for chest pain. Respiratory: Denies shortness of breath. Gastrointestinal: No abdominal pain.  No nausea, no vomiting.  No diarrhea.  No constipation. Genitourinary: Negative for dysuria. Musculoskeletal: Negative for neck pain.  Negative for back pain. Integumentary: Negative for rash. Neurological: Negative for headaches, focal weakness or numbness.   ____________________________________________   PHYSICAL EXAM:  VITAL SIGNS: ED Triage Vitals  Enc Vitals Group     BP 05/18/17 0222 (!) 222/81     Pulse Rate 05/18/17 0222 60     Resp 05/18/17 0222 20     Temp 05/18/17 0222 (!) 97.5 F (36.4 C)     Temp Source 05/18/17 0222 Oral     SpO2 05/18/17 0222 97 %     Weight 05/18/17 0220 108.9 kg (240 lb)     Height --  Head Circumference --      Peak Flow --      Pain Score 05/18/17 0220 8     Pain Loc --      Pain Edu? --      Excl. in [place]? --     Constitutional: Alert and oriented. Well appearing and in no acute distress. Eyes: Conjunctivae are normal.  Head: Atraumatic. Mouth/Throat: Mucous membranes are moist.  Oropharynx non-erythematous. Neck: No stridor.   Cardiovascular: Normal rate, regular rhythm. Good peripheral circulation. Grossly normal heart sounds. Respiratory: Normal respiratory effort.  No retractions. Lungs CTAB. Gastrointestinal: Soft and nontender. No distention.  Musculoskeletal: No lower extremity tenderness nor edema. No gross deformities of extremities. Neurologic:  Normal speech and language. No gross focal neurologic deficits are appreciated.  Skin:  Skin is warm, dry and intact. No rash noted. Psychiatric: Mood and affect are normal. Speech and behavior are normal.  ____________________________________________   LABS (all labs ordered are listed, but only abnormal results are displayed)  Labs Reviewed  BASIC METABOLIC PANEL - Abnormal; Notable for the following components:      Result Value   BUN 25 (*)     All other components within normal limits  CBC - Abnormal; Notable for the following components:   RBC 3.50 (*)    Hemoglobin 11.3 (*)    HCT 33.9 (*)    RDW 14.9 (*)    All other components within normal limits  TROPONIN I - Abnormal; Notable for the following components:   Troponin I 0.04 (*)    All other components within normal limits  TROPONIN I   ____________________________________________  EKG  ED ECG REPORT I, [place] N Mellie Buccellato, the attending physician, personally viewed and interpreted this ECG.   Date: 05/18/2017  EKG Time: 2:22 AM  Rate: 64  Rhythm: Normal sinus rhythm  Axis: Normal  Intervals: Normal  ST&T Change: None  ____________________________________________  RADIOLOGY I, Pepin N Markavious Micco, personally viewed and evaluated these images (plain radiographs) as part of my medical decision making, as well as reviewing the written report by the radiologist.  Dg Chest 2 View  Result Date: 05/18/2017 CLINICAL DATA:  71 year old male with chest pain. EXAM: CHEST  2 VIEW COMPARISON:  Chest CT dated 05/15/2017 FINDINGS: There is no focal consolidation, pleural effusion, or pneumothorax. Diffuse interstitial prominence and coarsening. The cardiac silhouette is within normal limits. There is diffuse osseous sclerosis consistent with metastatic disease. No definite acute fracture. IMPRESSION: 1. No focal consolidation. 2. Diffuse osseous sclerotic metastatic disease. Electronically Signed   By: Anner Crete M.D.   On: 05/18/2017 03:05      Procedures   ____________________________________________   INITIAL IMPRESSION / ASSESSMENT AND PLAN / ED COURSE  As part of my medical decision making, I reviewed the following data within the electronic MEDICAL RECORD [id number] year old male presented with above-stated history and physical exam of chest pain.  EKG revealed no evidence of ischemia or infarction laboratory data notable for troponin of 0.04 however I suspect the  patient's pain to be secondary to metastatic prostate cancer to the ribs.  Patient received IV Dilaudid in the emergency department with resolution of pain.  Will prescribe Percocet for home with recommendation to follow-up with oncology ____________________________________________  FINAL CLINICAL IMPRESSION(S) / ED DIAGNOSES  Final diagnoses:  Cancer, metastatic to bone [facility])     MEDICATIONS GIVEN DURING THIS VISIT:  Medications  morphine 2 MG/ML injection 2 mg (2 mg Intravenous Given 05/18/17 0313)  HYDROmorphone (DILAUDID) injection  1 mg (1 mg Intravenous Given 05/18/17 0419)  hydrALAZINE (APRESOLINE) injection 5 mg (5 mg Intravenous Given 05/18/17 0452)     ED Discharge Orders        Ordered    oxyCODONE-acetaminophen (ROXICET) 5-325 MG tablet  Every 4 hours PRN     05/18/17 6553       Note:  This document was prepared using Dragon voice recognition software and may include unintentional dictation errors.    Gregor Hams, MD 05/18/17 0715    Gregor Hams, MD 05/18/17 [phone]

## 2017-05-18 NOTE — ED Notes (Signed)
1 unsuccessful IV attempt left ac by this RN

## 2017-05-18 NOTE — ED Notes (Signed)
Pt states generalized squeezing CP began yesterday evening, radiating to left shoulder. Pt denies additional sx, but pain persistent. Pt was seen at cancer center yesterday for blood work

## 2017-05-18 NOTE — ED Notes (Signed)
Report to [place], [place]

## 2017-05-18 NOTE — ED Notes (Signed)
Patient transported to X-ray 

## 2017-05-26 ENCOUNTER — Other Ambulatory Visit: Payer: Self-pay

## 2017-05-26 ENCOUNTER — Emergency Department
Admission: EM | Admit: 2017-05-26 | Discharge: 2017-05-26 | Disposition: A | Discharge status: Home / Self Care | Payer: Medicare Other | Attending: Emergency Medicine | Admitting: Emergency Medicine

## 2017-05-26 ENCOUNTER — Encounter: Payer: Self-pay | Admitting: Emergency Medicine

## 2017-05-26 ENCOUNTER — Emergency Department: Payer: Medicare Other

## 2017-05-26 DIAGNOSIS — I2510 Atherosclerotic heart disease of native coronary artery without angina pectoris: Secondary | ICD-10-CM | POA: Diagnosis not present

## 2017-05-26 DIAGNOSIS — E119 Type 2 diabetes mellitus without complications: Secondary | ICD-10-CM | POA: Insufficient documentation

## 2017-05-26 DIAGNOSIS — J449 Chronic obstructive pulmonary disease, unspecified: Secondary | ICD-10-CM | POA: Insufficient documentation

## 2017-05-26 DIAGNOSIS — Z79899 Other long term (current) drug therapy: Secondary | ICD-10-CM | POA: Insufficient documentation

## 2017-05-26 DIAGNOSIS — Z7982 Long term (current) use of aspirin: Secondary | ICD-10-CM | POA: Insufficient documentation

## 2017-05-26 DIAGNOSIS — Z8546 Personal history of malignant neoplasm of prostate: Secondary | ICD-10-CM | POA: Diagnosis not present

## 2017-05-26 DIAGNOSIS — I10 Essential (primary) hypertension: Secondary | ICD-10-CM | POA: Diagnosis present

## 2017-05-26 DIAGNOSIS — F17210 Nicotine dependence, cigarettes, uncomplicated: Secondary | ICD-10-CM | POA: Diagnosis not present

## 2017-05-26 LAB — BASIC METABOLIC PANEL
ANION GAP: 8 (ref 5–15)
BUN: 21 mg/dL — AB (ref 6–20)
CHLORIDE: 104 mmol/L (ref 101–111)
CO2: 27 mmol/L (ref 22–32)
Calcium: 8.6 mg/dL — ABNORMAL LOW (ref 8.9–10.3)
Creatinine, Ser: 0.9 mg/dL (ref 0.61–1.24)
GFR calc Af Amer: >60 mL/min (ref >60)
GLUCOSE: 116 mg/dL — AB (ref 65–99)
POTASSIUM: 3.7 mmol/L (ref 3.5–5.1)
Sodium: 139 mmol/L (ref 135–145)

## 2017-05-26 LAB — CBC
HEMATOCRIT: 31.9 % — AB (ref 40.0–52.0)
HEMOGLOBIN: 10.9 g/dL — AB (ref 13.0–18.0)
MCH: 33 pg (ref 26.0–34.0)
MCHC: 34.1 g/dL (ref 32.0–36.0)
MCV: 96.6 fL (ref 80.0–100.0)
Platelets: 177 10*3/uL (ref 150–440)
RBC: 3.3 MIL/uL — ABNORMAL LOW (ref 4.40–5.90)
RDW: 14.8 % — AB (ref 11.5–14.5)
WBC: 6.1 10*3/uL (ref 3.8–10.6)

## 2017-05-26 LAB — TROPONIN I
TROPONIN I: 0.03 ng/mL — AB (ref <0.03)
Troponin I: 0.03 ng/mL (ref <0.03)

## 2017-05-26 NOTE — Discharge Instructions (Signed)
Return to the ER for new, worsening, or persistent high blood pressure especially blood pressure readings over 200 on the top #120 on the bottom number, or any new or worsening chest pain, severe headache, confusion, difficulty breathing, or any other new or worsening symptoms that concern you.  Follow-up with your primary care doctor within the next week.

## 2017-05-26 NOTE — ED Notes (Signed)
Spoke to Dr. Burlene Arnt regarding patient and notified patient of patient's troponin of 0.03.  Dr. Burlene Arnt aware and suggested patient get a room.

## 2017-05-26 NOTE — ED Provider Notes (Signed)
[facility] Emergency Department Provider Note ____________________________________________   First MD Initiated Contact with Patient 05/26/17 9147     (approximate)  I have reviewed the triage vital signs and the nursing notes.   HISTORY  Chief Complaint Hypertension and Chest Pain (Pressure/Tightness)    HPI [name] is a 71 y.o. male with history of metastatic prostate cancer, COPD, and other past medical history as noted below who presents with hypertension, measured to the low 200s at home, and not relieved by his normal blood pressure medications.  Patient reports some intermittent mild chest pain over the last few weeks, but this has been unchanged last several days.  He denies any new shortness of breath, or leg swelling.  He reports some mild lightheadedness.  Patient states that he has been on a reduced dose of his antihypertensives, and he followed up with his primary care doctor yesterday who noted that his blood pressure was slightly elevated.  His doctor recommended that at his next refill he go back up to a full dose of one of the reduced doses of the medications.     Past Medical History:  Diagnosis Date  . Arthritis   . Cancer [facility])    prostate  . COPD (chronic obstructive pulmonary disease) ([place])   . Coronary artery disease   . Cough    chronic  . Diabetes mellitus without complication ([place])   . Edema   . Elevated PSA   . Headache   . Hypertension   . Orthopnea   . Oxygen deficit    o2 prn  . Prostate cancer ([place])   . Shortness of breath dyspnea   . Sleep apnea    cpap    Patient Active Problem List   Diagnosis Date Noted  . Chest pain 01/19/2017  . Prostate cancer metastatic to bone ([place]) 07/28/2016  . Goals of care, counseling/discussion 07/28/2016  . Elevated PSA 05/01/2016  . Urinary retention 05/01/2016  . Pneumonia 03/23/2016  . ARF (acute renal failure) ([place]) 03/11/2016  . Dizziness 03/03/2016  . Mixed  hyperlipidemia 09/01/2015  . OSA (obstructive sleep apnea) 09/01/2015  . Atherosclerotic peripheral vascular disease with intermittent claudication ([place]) 04/16/2015  . Lumbar radiculitis 08/13/2014  . Lumbar stenosis with neurogenic claudication 08/13/2014  . Cardiomyopathy ([place]) 01/23/2013  . DOE (dyspnea on exertion) 01/23/2013  . Hyperlipidemia 01/23/2013  . PVC (premature ventricular contraction) 01/23/2013  . Nonspecific elevation of levels of transaminase and lactic acid dehydrogenase (ldh) 07/21/2011  . Type 2 diabetes mellitus without complications ([place]) 82/95/6213  . Sciatica 06/28/2010  . Chronic obstructive pulmonary disease ([place]) 12/02/2008  . Essential (primary) hypertension 09/05/2007  . Personal history of transient ischemic attack (TIA), and cerebral infarction without residual deficits 09/05/2007    Past Surgical History:  Procedure Laterality Date  . CARDIAC CATHETERIZATION  2014  . COLONOSCOPY    . CYSTOSCOPY W/ RETROGRADES Bilateral 10/18/2016   Procedure: CYSTOSCOPY WITH RETROGRADE PYELOGRAM;  Surgeon: Hollice Espy, MD;  Location: [facility] ORS;  Service: Urology;  Laterality: Bilateral;  . EYE SURGERY    . KNEE ARTHROSCOPY    . TRANSURETHRAL RESECTION OF BLADDER TUMOR N/A 10/18/2016   Procedure: TRANSURETHRAL RESECTION OF BLADDER TUMOR (TURBT);  Surgeon: Hollice Espy, MD;  Location: [facility] ORS;  Service: Urology;  Laterality: N/A;  . TRANSURETHRAL RESECTION OF PROSTATE N/A 10/18/2016   Procedure: TRANSURETHRAL RESECTION OF THE PROSTATE (TURP) CHANNEL TURP;  Surgeon: Hollice Espy, MD;  Location: [facility] ORS;  Service: Urology;  Laterality:  N/A;    Prior to Admission medications   Medication Sig Start Date End Date Taking? Authorizing Provider  aspirin EC 325 MG tablet Take 325 mg by mouth every morning.   Yes [provider]  atorvastatin (LIPITOR) 40 MG tablet Take 1 tablet (40 mg total) by mouth daily. 03/24/16  Yes Mody, Ulice Bold, MD  cloNIDine (CATAPRES) 0.2  MG tablet Take 1 tablet (0.2 mg total) by mouth 2 (two) times daily. 01/19/17  Yes Gladstone Lighter, MD  ferrous sulfate 325 (65 FE) MG EC tablet Take 1 tablet (325 mg total) by mouth 2 (two) times daily with a meal. 07/28/16  Yes Sindy Guadeloupe, MD  finasteride (PROSCAR) 5 MG tablet Take 5 mg by mouth daily.   Yes [provider]  Fluticasone-Salmeterol (ADVAIR) 250-50 MCG/DOSE AEPB Inhale 1 puff into the lungs 2 (two) times daily.   Yes [provider]  GLIPIZIDE XL 10 MG 24 hr tablet Take 10 mg by mouth 2 (two) times daily.  04/26/17  Yes [provider]  ketoconazole (NIZORAL) 2 % cream Apply 1 application topically daily as needed.  04/03/17  Yes [provider]  losartan (COZAAR) 25 MG tablet Take 25 mg by mouth daily. 05/23/17  Yes [provider]  metFORMIN (GLUCOPHAGE) 500 MG tablet Take 500 mg by mouth 2 (two) times daily with a meal.   Yes [provider]  metoprolol (LOPRESSOR) 50 MG tablet Take 50 mg by mouth 2 (two) times daily.   Yes [provider]  tamsulosin (FLOMAX) 0.4 MG CAPS capsule Take 1 capsule (0.4 mg total) by mouth daily. 05/29/16  Yes [name], MD  vitamin B-12 (CYANOCOBALAMIN) 1000 MCG tablet Take 1 tablet (1,000 mcg total) by mouth daily. 07/28/16  Yes Sindy Guadeloupe, MD  abiraterone Acetate (ZYTIGA) 250 MG tablet Take 4 tablets (1,000 mg total) by mouth daily. Take on an empty stomach 1 hour before or 2 hours after a meal Patient not taking: Reported on 05/26/2017 03/12/17   Sindy Guadeloupe, MD  oxyCODONE-acetaminophen (ROXICET) 5-325 MG tablet Take 1 tablet by mouth every 4 (four) hours as needed for severe pain. Patient not taking: Reported on 05/26/2017 05/18/17   Gregor Hams, MD  predniSONE (DELTASONE) 5 MG tablet Take 1 tablet (5 mg total) 2 (two) times daily with a meal by mouth. Patient not taking: Reported on 05/26/2017 04/17/17   Sindy Guadeloupe, MD    Allergies Patient has no known  allergies.  Family History  Problem Relation Age of Onset  . CVA Mother   . CAD Father     Social History Social History   Tobacco Use  . Smoking status: Current Every Day Smoker    Packs/day: 0.50    Years: 55.00    Pack years: 27.50    Types: Cigarettes  . Smokeless tobacco: Never Used  Substance Use Topics  . Alcohol use: No  . Drug use: No    Review of Systems  Constitutional: No fever/chills. Eyes: No visual changes. ENT: No sore throat. Cardiovascular: Positive for chest pain. Respiratory: Positive for chronic shortness of breath. Gastrointestinal: No nausea, no vomiting.  No diarrhea.  Genitourinary: Negative for dysuria.  Musculoskeletal: Negative for back pain. Skin: Negative for rash. Neurological: Negative for headaches, focal weakness or numbness.   ____________________________________________   PHYSICAL EXAM:  VITAL SIGNS: ED Triage Vitals  Enc Vitals Group     BP 05/26/17 1514 (!) 196/75     Pulse Rate 05/26/17  1514 69     Resp 05/26/17 1514 20     Temp 05/26/17 1514 98.1 F (36.7 C)     Temp Source 05/26/17 1514 Oral     SpO2 05/26/17 1514 96 %     Weight 05/26/17 1513 240 lb (108.9 kg)     Height 05/26/17 1513 5\' 10"  (1.778 m)     Head Circumference --      Peak Flow --      Pain Score 05/26/17 1519 7     Pain Loc --      Pain Edu? --      Excl. in [place]? --     Constitutional: Alert and oriented.  Relatively well appearing and in no acute distress. Eyes: Conjunctivae are normal.  Head: Atraumatic. Nose: No congestion/rhinnorhea. Mouth/Throat: Mucous membranes are moist.   Neck: Normal range of motion.  Cardiovascular: Normal rate, regular rhythm. Grossly normal heart sounds.  Good peripheral circulation. Respiratory: Normal respiratory effort.  No retractions.  Trace rhonchi but lungs otherwise clear. Gastrointestinal: Soft and nontender. No distention.  Genitourinary: No CVA tenderness. Musculoskeletal: No lower extremity edema.   Extremities warm and well perfused.  Neurologic:  Normal speech and language. No gross focal neurologic deficits are appreciated.  Skin:  Skin is warm and dry. No rash noted. Psychiatric: Mood and affect are normal. Speech and behavior are normal.  ____________________________________________   LABS (all labs ordered are listed, but only abnormal results are displayed)  Labs Reviewed  BASIC METABOLIC PANEL - Abnormal; Notable for the following components:      Result Value   Glucose, Bld 116 (*)    BUN 21 (*)    Calcium 8.6 (*)    All other components within normal limits  CBC - Abnormal; Notable for the following components:   RBC 3.30 (*)    Hemoglobin 10.9 (*)    HCT 31.9 (*)    RDW 14.8 (*)    All other components within normal limits  TROPONIN I - Abnormal; Notable for the following components:   Troponin I 0.03 (*)    All other components within normal limits  TROPONIN I - Abnormal; Notable for the following components:   Troponin I 0.03 (*)    All other components within normal limits   ____________________________________________  EKG  ED ECG REPORT I, Arta Silence, the attending physician, personally viewed and interpreted this ECG.  Date: 05/26/2017 EKG Time: 1522 Rate: 66 Rhythm: normal sinus rhythm QRS Axis: normal Intervals: normal ST/T Wave abnormalities: Nonspecific lateral T wave abnormalities Narrative Interpretation: no evidence of acute ischemia; no significant change when compared to EKG of 05/18/2017  ____________________________________________  RADIOLOGY  CXR: Diffuse skeletal metastases but no other acute findings  ____________________________________________   PROCEDURES  Procedure(s) performed: No    Critical Care performed: No ____________________________________________   INITIAL IMPRESSION / ASSESSMENT AND PLAN / ED COURSE  Pertinent labs & imaging results that were available during my care of the patient were  reviewed by me and considered in my medical decision making (see chart for details).  72 year old male with past medical history as noted above presents primarily for elevated blood pressure in the 294 systolic range (patient states he has not noted the bottom number) with some mild chest pain, but he states that this is been chronic and unchanged.  No significant associated symptoms otherwise.  Review of past medical records in epic reveals the patient was seen in the ED on 12 7 for chest pain, and  ruled out for ACS; this was thought to be skeletal pain related to his metastases.  On exam, patient's blood pressure is improved spontaneously, his other vital signs are normal.  The patient is relatively comfortable appearing, and the remainder the exam is unremarkable for acute findings.  Patient states he is compliant with medications.  Overall unclear cause of the elevated blood pressure other than exacerbation of his chronic hypertension; given no new chest pain or other acute symptoms, there is low suspicion for acute hypertensive crisis.  We will obtain troponin x2 as well as basic labs and reassess. ----------------------------------------- 7:21 PM on 05/26/2017 -----------------------------------------  Patient's blood pressure remains slightly elevated but stable.  He appears comfortable and denies any acute symptoms.  Troponin was 0.03 twice, which is down from troponin obtained 1 week ago.  No evidence of ACS or other acute end organ dysfunction.  Patient states he feels well and would like to go home.  At this time there is no indication for further ED observation.  Patient instructed to continue his normal blood pressure medications as prescribed, and follow-up with his primary care doctor.  Return precautions given, and patient expresses understanding.     ____________________________________________   FINAL CLINICAL IMPRESSION(S) / ED DIAGNOSES  Final diagnoses:  Hypertension,  unspecified type      NEW MEDICATIONS STARTED DURING THIS VISIT:  This SmartLink is deprecated. Use AVSMEDLIST instead to display the medication list for a patient.   Note:  This document was prepared using Dragon voice recognition software and may include unintentional dictation errors.    Arta Silence, MD 05/26/17 1924

## 2017-05-26 NOTE — ED Triage Notes (Addendum)
Pt arrived via POV from home with c/o " blood pressure staying up"  Pt states his BP at home was 656 systolic.  Pt states he was seen by PCP yesterday and had an increase in BP meds.  Taking Losartan 50mg  from 25mg  daily. Also taking Clonidine 0.2mg  BID  Pt also c/o chest tightness and pressure on the left and shortness of breath. Pt states he was seen recently for the same, states symptoms have remained the same, but persistent.

## 2017-05-29 ENCOUNTER — Other Ambulatory Visit: Payer: Self-pay | Admitting: Radiation Oncology

## 2017-05-29 ENCOUNTER — Other Ambulatory Visit: Payer: Self-pay | Admitting: *Deleted

## 2017-05-29 DIAGNOSIS — C7951 Secondary malignant neoplasm of bone: Principal | ICD-10-CM

## 2017-05-29 DIAGNOSIS — C61 Malignant neoplasm of prostate: Secondary | ICD-10-CM

## 2017-05-31 ENCOUNTER — Ambulatory Visit: Payer: Medicare Other

## 2017-06-01 ENCOUNTER — Ambulatory Visit (INDEPENDENT_AMBULATORY_CARE_PROVIDER_SITE_OTHER): Payer: Medicare Other | Admitting: Urology

## 2017-06-01 ENCOUNTER — Encounter: Payer: Self-pay | Admitting: Urology

## 2017-06-01 VITALS — BP 148/64 | HR 65 | Ht 70.0 in | Wt 238.0 lb

## 2017-06-01 DIAGNOSIS — C61 Malignant neoplasm of prostate: Secondary | ICD-10-CM | POA: Diagnosis not present

## 2017-06-01 DIAGNOSIS — I200 Unstable angina: Secondary | ICD-10-CM

## 2017-06-01 DIAGNOSIS — R3915 Urgency of urination: Secondary | ICD-10-CM | POA: Diagnosis not present

## 2017-06-01 DIAGNOSIS — R3129 Other microscopic hematuria: Secondary | ICD-10-CM

## 2017-06-01 LAB — BLADDER SCAN AMB NON-IMAGING: SCAN RESULT: 60

## 2017-06-01 NOTE — Progress Notes (Signed)
06/01/2017 8:43 AM   Michael Mathews Sep 05, 1945 [id number]  Referring provider: [name], MD [name], [name] [address]  Chief Complaint  Patient presents with  . Prostate Cancer    HPI: 71 year old male with a history of advanced metastatic castrate resistant prostate cancer who returns today for urologic evaluation.  He underwent bladder biopsy/ channel TURP on 10/18/2016.  Surgical pathology of the bladder was consistent with cystitis and urothelial dysplasia but did not meet the criteria for CIS.  His TURP chips are consistent with Gleason 4+5 prostate cancer.    He is under the care of both his medical oncologist, Dr. [name] radiation oncology, Dr. Baruch Gouty.  Most recently, he has had progression of his bony metastatic disease and will be undergoing treatment with radium 223.  He is also on Lupron, Zytiga and prednisone.  His PSA has been a poor biochemical marker as it is remained quite low.  Today, he complains of urinary urgency and frequency which has been going on for about 2 months.  He also had a few episodes of urge incontinence which is quite bothersome.  He was seen 2 weeks ago in the emergency room at which time his UA was negative other than for microscopic blood.  UTI.  He denies any dysuria.  He feels like his stream is fairly decent he is able to empty his bladder for the most part.No splitting of his urinary stream.  IPSS as below.  PVR today 60   IPSS    Row Name 06/01/17 0800         International Prostate Symptom Score   How often have you had the sensation of not emptying your bladder?  Less than half the time     How often have you had to urinate less than every two hours?  More than half the time     How often have you found you stopped and started again several times when you urinated?  Less than half the time     How often have you found it difficult to postpone urination?  Less than half the time     How often have you had a weak  urinary stream?  Less than half the time     How often have you had to strain to start urination?  Not at All     How many times did you typically get up at night to urinate?  3 Times     Total IPSS Score  15       Quality of Life due to urinary symptoms   If you were to spend the rest of your life with your urinary condition just the way it is now how would you feel about that?  Mostly Satisfied        Score:  1-7 Mild 8-19 Moderate 20-35 Severe    PMH: Past Medical History:  Diagnosis Date  . Arthritis   . Cancer [facility])    prostate  . COPD (chronic obstructive pulmonary disease) ([place])   . Coronary artery disease   . Cough    chronic  . Diabetes mellitus without complication ([place])   . Edema   . Elevated PSA   . Headache   . Hypertension   . Orthopnea   . Oxygen deficit    o2 prn  . Prostate cancer ([place])   . Shortness of breath dyspnea   . Sleep apnea    cpap    Surgical History:  Past Surgical History:  Procedure Laterality Date  . CARDIAC CATHETERIZATION  2014  . COLONOSCOPY    . CYSTOSCOPY W/ RETROGRADES Bilateral 10/18/2016   Procedure: CYSTOSCOPY WITH RETROGRADE PYELOGRAM;  Surgeon: Hollice Espy, MD;  Location: [facility] ORS;  Service: Urology;  Laterality: Bilateral;  . EYE SURGERY    . KNEE ARTHROSCOPY    . TRANSURETHRAL RESECTION OF BLADDER TUMOR N/A 10/18/2016   Procedure: TRANSURETHRAL RESECTION OF BLADDER TUMOR (TURBT);  Surgeon: Hollice Espy, MD;  Location: [facility] ORS;  Service: Urology;  Laterality: N/A;  . TRANSURETHRAL RESECTION OF PROSTATE N/A 10/18/2016   Procedure: TRANSURETHRAL RESECTION OF THE PROSTATE (TURP) CHANNEL TURP;  Surgeon: Hollice Espy, MD;  Location: [facility] ORS;  Service: Urology;  Laterality: N/A;    Home Medications:  Allergies as of 06/01/2017   No Known Allergies     Medication List        Accurate as of 06/01/17  8:43 AM. Always use your most recent med list.          abiraterone acetate 250 MG tablet Commonly known as:   ZYTIGA Take 4 tablets (1,000 mg total) by mouth daily. Take on an empty stomach 1 hour before or 2 hours after a meal   aspirin EC 325 MG tablet Take 325 mg by mouth every morning.   atorvastatin 40 MG tablet Commonly known as:  LIPITOR Take 1 tablet (40 mg total) by mouth daily.   cloNIDine 0.2 MG tablet Commonly known as:  CATAPRES Take 1 tablet (0.2 mg total) by mouth 2 (two) times daily.   ferrous sulfate 325 (65 FE) MG EC tablet Take 1 tablet (325 mg total) by mouth 2 (two) times daily with a meal.   finasteride 5 MG tablet Commonly known as:  PROSCAR Take 5 mg by mouth daily.   Fluticasone-Salmeterol 250-50 MCG/DOSE Aepb Commonly known as:  ADVAIR Inhale 1 puff into the lungs 2 (two) times daily.   GLIPIZIDE XL 10 MG 24 hr tablet Generic drug:  glipiZIDE Take 10 mg by mouth 2 (two) times daily.   ketoconazole 2 % cream Commonly known as:  NIZORAL Apply 1 application topically daily as needed.   losartan 25 MG tablet Commonly known as:  COZAAR Take 25 mg by mouth daily.   metFORMIN 500 MG tablet Commonly known as:  GLUCOPHAGE Take 500 mg by mouth 2 (two) times daily with a meal.   metoprolol tartrate 50 MG tablet Commonly known as:  LOPRESSOR Take 50 mg by mouth 2 (two) times daily.   oxyCODONE-acetaminophen 5-325 MG tablet Commonly known as:  ROXICET Take 1 tablet by mouth every 4 (four) hours as needed for severe pain.   predniSONE 5 MG tablet Commonly known as:  DELTASONE Take 1 tablet (5 mg total) 2 (two) times daily with a meal by mouth.   tamsulosin 0.4 MG Caps capsule Commonly known as:  FLOMAX Take 1 capsule (0.4 mg total) by mouth daily.   vitamin B-12 1000 MCG tablet Commonly known as:  CYANOCOBALAMIN Take 1 tablet (1,000 mcg total) by mouth daily.       Allergies: No Known Allergies  Family History: Family History  Problem Relation Age of Onset  . CVA Mother   . CAD Father     Social History:  reports that he has been smoking  cigarettes.  He has a 27.50 pack-year smoking history. he has never used smokeless tobacco. He reports that he does not drink alcohol or use drugs.  ROS: UROLOGY Frequent Urination?:  Yes Hard to postpone urination?: No Burning/pain with urination?: No Get up at night to urinate?: Yes Leakage of urine?: No Urine stream starts and stops?: No Trouble starting stream?: No Do you have to strain to urinate?: No Blood in urine?: No Urinary tract infection?: No Sexually transmitted disease?: No Injury to kidneys or bladder?: No Painful intercourse?: No Weak stream?: No Erection problems?: No Penile pain?: No  Gastrointestinal Nausea?: No Vomiting?: No Indigestion/heartburn?: No Diarrhea?: No Constipation?: No  Constitutional Fever: No Night sweats?: Yes Weight loss?: No Fatigue?: No  Skin Skin rash/lesions?: No Itching?: No  Eyes Blurred vision?: Yes Double vision?: No  Ears/Nose/Throat Sore throat?: No Sinus problems?: No  Hematologic/Lymphatic Swollen glands?: No Easy bruising?: No  Cardiovascular Leg swelling?: No Chest pain?: No  Respiratory Cough?: No Shortness of breath?: Yes  Endocrine Excessive thirst?: No  Musculoskeletal Back pain?: Yes Joint pain?: No  Neurological Headaches?: Yes Dizziness?: No  Psychologic Depression?: No Anxiety?: No  Physical Exam: BP (!) 148/64   Pulse 65   Ht 5\' 10"  (1.778 m)   Wt 238 lb (108 kg)   BMI 34.15 kg/m   Constitutional:  Alert and oriented, No acute distress. HEENT: [place] AT, moist mucus membranes.  Trachea midline, no masses. Cardiovascular: No clubbing, cyanosis, or edema. Respiratory: Normal respiratory effort, no increased work of breathing. Skin: Significant areas of hypopigmentation, diffuse.   Neurologic: Grossly intact, no focal deficits, moving all 4 extremities. Psychiatric: Normal mood and affect.  Laboratory Data: Lab Results  Component Value Date   WBC 6.1 05/26/2017   HGB 10.9  (L) 05/26/2017   HCT 31.9 (L) 05/26/2017   MCV 96.6 05/26/2017   PLT 177 05/26/2017    Lab Results  Component Value Date   CREATININE 0.90 05/26/2017    Lab Results  Component Value Date   PSA1 10.3 (H) 05/29/2016    Lab Results  Component Value Date   TESTOSTERONE <3 (L) 03/30/2017    Lab Results  Component Value Date   HGBA1C 5.7 (H) 01/19/2017    Urinalysis Component     Latest Ref Rng & Units 05/12/2017  Color, Urine     YELLOW YELLOW (A)  Appearance     CLEAR HAZY (A)  Glucose     NEGATIVE mg/dL NEGATIVE  Bilirubin Urine     NEGATIVE NEGATIVE  Ketones, ur     NEGATIVE mg/dL NEGATIVE  Specific Gravity, Urine     1.005 - 1.030 1.020  Hgb urine dipstick     NEGATIVE SMALL (A)  pH     5.0 - 8.0 5.0  Protein     NEGATIVE mg/dL 100 (A)  Nitrite     NEGATIVE NEGATIVE  Leukocytes, UA     NEGATIVE NEGATIVE  RBC / HPF     0 - 5 RBC/hpf 6-30  WBC, UA     0 - 5 WBC/hpf 0-5  Bacteria, UA     NONE SEEN NONE SEEN  Squamous Epithelial / LPF     NONE SEEN NONE SEEN  Mucus      PRESENT    Pertinent Imaging: Bone scan from 05/16/17 personally reviewed today  Assessment & Plan:    1. Prostate cancer [facility]) Metastatic castrate resistant with recent progression-will be started 2-3 Managed by Dr. Rogue Bussing Dr. Baruch Gouty  2. Urinary urgency Continue Flomax Unable to void today so little concern for infection Adequate bladder emptying without concern for ongoing obstruction Primary complaining of irritative voiding symptoms, urgency and urge incontinence Behavior  modification discussed, avoid soda/ tea Given that he is emptying his bladder adequately, he was given a trial 25 mg x 4 weeks Return to care in 2 weeks for nurse visit post void residual check, symptoms check We will provide prescription if he is taking adequately medication efficacious F/u sooner if meds do not help - BLADDER SCAN AMB NON-IMAGING  3. Microscopic hematuria S/p recent bladder  biopsy, scans No indication for further work up at this time   Return in about 2 weeks (around 06/15/2017) for nurse visit for PVR/ check symptoms, MD visit 6 mo.  Hollice Espy, MD  [facility] [address] [place],  [address] [phone]

## 2017-06-13 ENCOUNTER — Other Ambulatory Visit: Payer: Self-pay | Admitting: *Deleted

## 2017-06-14 ENCOUNTER — Inpatient Hospital Stay: Payer: Medicare Other | Attending: Internal Medicine

## 2017-06-14 DIAGNOSIS — I712 Thoracic aortic aneurysm, without rupture: Secondary | ICD-10-CM | POA: Insufficient documentation

## 2017-06-14 DIAGNOSIS — E119 Type 2 diabetes mellitus without complications: Secondary | ICD-10-CM | POA: Insufficient documentation

## 2017-06-14 DIAGNOSIS — K4020 Bilateral inguinal hernia, without obstruction or gangrene, not specified as recurrent: Secondary | ICD-10-CM | POA: Insufficient documentation

## 2017-06-14 DIAGNOSIS — I700 Atherosclerosis of aorta: Secondary | ICD-10-CM | POA: Insufficient documentation

## 2017-06-14 DIAGNOSIS — G4730 Sleep apnea, unspecified: Secondary | ICD-10-CM | POA: Insufficient documentation

## 2017-06-14 DIAGNOSIS — C7951 Secondary malignant neoplasm of bone: Secondary | ICD-10-CM | POA: Insufficient documentation

## 2017-06-14 DIAGNOSIS — I10 Essential (primary) hypertension: Secondary | ICD-10-CM | POA: Insufficient documentation

## 2017-06-14 DIAGNOSIS — I313 Pericardial effusion (noninflammatory): Secondary | ICD-10-CM | POA: Insufficient documentation

## 2017-06-14 DIAGNOSIS — C61 Malignant neoplasm of prostate: Secondary | ICD-10-CM | POA: Insufficient documentation

## 2017-06-14 DIAGNOSIS — J449 Chronic obstructive pulmonary disease, unspecified: Secondary | ICD-10-CM | POA: Insufficient documentation

## 2017-06-14 DIAGNOSIS — F17210 Nicotine dependence, cigarettes, uncomplicated: Secondary | ICD-10-CM | POA: Insufficient documentation

## 2017-06-14 DIAGNOSIS — Z79899 Other long term (current) drug therapy: Secondary | ICD-10-CM | POA: Insufficient documentation

## 2017-06-14 DIAGNOSIS — D649 Anemia, unspecified: Secondary | ICD-10-CM | POA: Insufficient documentation

## 2017-06-15 ENCOUNTER — Ambulatory Visit (INDEPENDENT_AMBULATORY_CARE_PROVIDER_SITE_OTHER): Payer: Medicare Other

## 2017-06-15 VITALS — BP 161/74 | HR 60 | Ht 70.0 in | Wt 228.0 lb

## 2017-06-15 DIAGNOSIS — R339 Retention of urine, unspecified: Secondary | ICD-10-CM | POA: Diagnosis not present

## 2017-06-15 NOTE — Progress Notes (Signed)
Bladder Scan-23 Patient can void: Performed By: Toniann Fail, LPN   Blood pressure (!) 161/74, pulse 60, height 5\' 10"  (1.778 m), weight 228 lb (103.4 kg).

## 2017-06-18 ENCOUNTER — Emergency Department: Payer: Medicare Other

## 2017-06-18 ENCOUNTER — Encounter: Payer: Self-pay | Admitting: Radiology

## 2017-06-18 ENCOUNTER — Other Ambulatory Visit: Payer: Self-pay

## 2017-06-18 ENCOUNTER — Emergency Department
Admission: EM | Admit: 2017-06-18 | Discharge: 2017-06-18 | Disposition: A | Discharge status: Home / Self Care | Payer: Medicare Other | Attending: Emergency Medicine | Admitting: Emergency Medicine

## 2017-06-18 DIAGNOSIS — E119 Type 2 diabetes mellitus without complications: Secondary | ICD-10-CM | POA: Diagnosis not present

## 2017-06-18 DIAGNOSIS — J449 Chronic obstructive pulmonary disease, unspecified: Secondary | ICD-10-CM | POA: Insufficient documentation

## 2017-06-18 DIAGNOSIS — I119 Hypertensive heart disease without heart failure: Secondary | ICD-10-CM | POA: Diagnosis not present

## 2017-06-18 DIAGNOSIS — C799 Secondary malignant neoplasm of unspecified site: Secondary | ICD-10-CM

## 2017-06-18 DIAGNOSIS — R0602 Shortness of breath: Secondary | ICD-10-CM | POA: Insufficient documentation

## 2017-06-18 DIAGNOSIS — R079 Chest pain, unspecified: Secondary | ICD-10-CM | POA: Insufficient documentation

## 2017-06-18 DIAGNOSIS — F17210 Nicotine dependence, cigarettes, uncomplicated: Secondary | ICD-10-CM | POA: Diagnosis not present

## 2017-06-18 LAB — CBC
HCT: 30.9 % — ABNORMAL LOW (ref 40.0–52.0)
Hemoglobin: 10.5 g/dL — ABNORMAL LOW (ref 13.0–18.0)
MCH: 32.5 pg (ref 26.0–34.0)
MCHC: 34 g/dL (ref 32.0–36.0)
MCV: 95.8 fL (ref 80.0–100.0)
PLATELETS: 153 10*3/uL (ref 150–440)
RBC: 3.23 MIL/uL — ABNORMAL LOW (ref 4.40–5.90)
RDW: 15.3 % — ABNORMAL HIGH (ref 11.5–14.5)
WBC: 7.1 10*3/uL (ref 3.8–10.6)

## 2017-06-18 LAB — TROPONIN I: TROPONIN I: 0.03 ng/mL — AB (ref <0.03)

## 2017-06-18 LAB — BASIC METABOLIC PANEL
Anion gap: 11 (ref 5–15)
BUN: 21 mg/dL — ABNORMAL HIGH (ref 6–20)
CALCIUM: 8.8 mg/dL — AB (ref 8.9–10.3)
CO2: 27 mmol/L (ref 22–32)
CREATININE: 0.79 mg/dL (ref 0.61–1.24)
Chloride: 103 mmol/L (ref 101–111)
GFR calc Af Amer: >60 mL/min (ref >60)
Glucose, Bld: 132 mg/dL — ABNORMAL HIGH (ref 65–99)
Potassium: 3.3 mmol/L — ABNORMAL LOW (ref 3.5–5.1)
SODIUM: 141 mmol/L (ref 135–145)

## 2017-06-18 MED ORDER — IOPAMIDOL (ISOVUE-370) INJECTION 76%
75.0000 mL | Freq: Once | INTRAVENOUS | Status: AC | PRN
  Administered 2017-06-18: 75 mL via INTRAVENOUS

## 2017-06-18 MED ORDER — KETOROLAC TROMETHAMINE 30 MG/ML IJ SOLN
30.0000 mg | Freq: Once | INTRAMUSCULAR | Status: AC
  Administered 2017-06-18: 30 mg via INTRAVENOUS
  Filled 2017-06-18: qty 1

## 2017-06-18 MED ORDER — MORPHINE SULFATE (PF) 4 MG/ML IV SOLN
4.0000 mg | Freq: Once | INTRAVENOUS | Status: DC
  Filled 2017-06-18: qty 1

## 2017-06-18 MED ORDER — OXYCODONE-ACETAMINOPHEN 5-325 MG PO TABS: 1.0000 | ORAL_TABLET | Freq: Three times a day (TID) | ORAL | 0 refills | Status: DC | PRN

## 2017-06-18 NOTE — ED Triage Notes (Signed)
Pt presents this am after being woken at 1am with pain across his chest; constant squeezing pain; some shortness of breath; denies N/V; pale skin; denies black or bloody stools; "not much" abd pain;

## 2017-06-18 NOTE — ED Provider Notes (Signed)
[facility] Emergency Department Provider Note       Time seen: ----------------------------------------- 8:24 AM on 06/18/2017 -----------------------------------------   I have reviewed the triage vital signs and the nursing notes.  HISTORY   Chief Complaint Chest Pain    HPI Michael Mathews is a 72 y.o. male with a history of COPD, coronary artery disease, diabetes, hypertension who presents to the ED for chest pain that had awoken him at 1 AM.  Patient describes a constant squeezing pain with some shortness of breath.  Patient states he has not had chest pain since he was here last month.  He has had CT imaging of the chest as well as a bone scan which showed widespread bony metastatic disease from his prostate cancer.  Past Medical History:  Diagnosis Date  . Arthritis   . Cancer [facility])    prostate  . COPD (chronic obstructive pulmonary disease) ([place])   . Coronary artery disease   . Cough    chronic  . Diabetes mellitus without complication ([place])   . Edema   . Elevated PSA   . Headache   . Hypertension   . Orthopnea   . Oxygen deficit    o2 prn  . Prostate cancer ([place])   . Shortness of breath dyspnea   . Sleep apnea    cpap    Patient Active Problem List   Diagnosis Date Noted  . Chest pain 01/19/2017  . Prostate cancer metastatic to bone ([place]) 07/28/2016  . Goals of care, counseling/discussion 07/28/2016  . Elevated PSA 05/01/2016  . Urinary retention 05/01/2016  . Pneumonia 03/23/2016  . ARF (acute renal failure) ([place]) 03/11/2016  . Dizziness 03/03/2016  . Mixed hyperlipidemia 09/01/2015  . OSA (obstructive sleep apnea) 09/01/2015  . Atherosclerotic peripheral vascular disease with intermittent claudication ([place]) 04/16/2015  . Lumbar radiculitis 08/13/2014  . Lumbar stenosis with neurogenic claudication 08/13/2014  . Cardiomyopathy ([place]) 01/23/2013  . DOE (dyspnea on exertion) 01/23/2013  . Hyperlipidemia 01/23/2013  . PVC  (premature ventricular contraction) 01/23/2013  . Nonspecific elevation of levels of transaminase and lactic acid dehydrogenase (ldh) 07/21/2011  . Type 2 diabetes mellitus without complications ([place]) 16/03/9603  . Sciatica 06/28/2010  . Chronic obstructive pulmonary disease ([place]) 12/02/2008  . Essential (primary) hypertension 09/05/2007  . Personal history of transient ischemic attack (TIA), and cerebral infarction without residual deficits 09/05/2007    Past Surgical History:  Procedure Laterality Date  . CARDIAC CATHETERIZATION  2014  . COLONOSCOPY    . CYSTOSCOPY W/ RETROGRADES Bilateral 10/18/2016   Procedure: CYSTOSCOPY WITH RETROGRADE PYELOGRAM;  Surgeon: Hollice Espy, MD;  Location: [facility] ORS;  Service: Urology;  Laterality: Bilateral;  . EYE SURGERY    . KNEE ARTHROSCOPY    . TRANSURETHRAL RESECTION OF BLADDER TUMOR N/A 10/18/2016   Procedure: TRANSURETHRAL RESECTION OF BLADDER TUMOR (TURBT);  Surgeon: Hollice Espy, MD;  Location: [facility] ORS;  Service: Urology;  Laterality: N/A;  . TRANSURETHRAL RESECTION OF PROSTATE N/A 10/18/2016   Procedure: TRANSURETHRAL RESECTION OF THE PROSTATE (TURP) CHANNEL TURP;  Surgeon: Hollice Espy, MD;  Location: [facility] ORS;  Service: Urology;  Laterality: N/A;    Allergies Patient has no known allergies.  Social History Social History   Tobacco Use  . Smoking status: Current Every Day Smoker    Packs/day: 0.50    Years: 55.00    Pack years: 27.50    Types: Cigarettes  . Smokeless tobacco: Never Used  Substance Use Topics  . Alcohol use: No  .  Drug use: No    Review of Systems Constitutional: Negative for fever. Eyes: Negative for vision changes ENT:  Negative for congestion, sore throat Cardiovascular: Positive for chest pain Respiratory: Positive for shortness of breath Gastrointestinal: Negative for abdominal pain, vomiting and diarrhea. Genitourinary: Negative for dysuria. Musculoskeletal: Negative for back pain. Skin:  Negative for rash. Neurological: Negative for headaches, focal weakness or numbness.  All systems negative/normal/unremarkable except as stated in the HPI  ____________________________________________   PHYSICAL EXAM:  VITAL SIGNS: ED Triage Vitals  Enc Vitals Group     BP 06/18/17 0639 (!) 186/70     Pulse Rate 06/18/17 0639 62     Resp 06/18/17 0639 16     Temp 06/18/17 0639 98.1 F (36.7 C)     Temp Source 06/18/17 0639 Oral     SpO2 06/18/17 0639 97 %     Weight 06/18/17 0639 225 lb (102.1 kg)     Height 06/18/17 0639 5\' 10"  (1.778 m)     Head Circumference --      Peak Flow --      Pain Score 06/18/17 0647 7     Pain Loc --      Pain Edu? --      Excl. in [place]? --     Constitutional: Alert and oriented. Well appearing and in no distress. Eyes: Conjunctivae are normal. Normal extraocular movements. ENT   Head: Normocephalic and atraumatic.   Nose: No congestion/rhinnorhea.   Mouth/Throat: Mucous membranes are moist.   Neck: No stridor. Cardiovascular: Normal rate, regular rhythm. No murmurs, rubs, or gallops. Respiratory: Normal respiratory effort without tachypnea nor retractions. Breath sounds are clear and equal bilaterally. No wheezes/rales/rhonchi. Gastrointestinal: Soft and nontender. Normal bowel sounds Musculoskeletal: Nontender with normal range of motion in extremities. No lower extremity tenderness nor edema. Neurologic:  Normal speech and language. No gross focal neurologic deficits are appreciated.  Skin:  Skin is warm, dry and intact. No rash noted. Psychiatric: Mood and affect are normal. Speech and behavior are normal.  ____________________________________________  EKG: Interpreted by me.  Sinus bradycardia with a rate of 58 bpm, inferior and lateral ST depression not changed from prior.  Borderline long QT.  Normal axis.  ____________________________________________  ED COURSE:  As part of my medical decision making, I reviewed the  following data within the [place] History obtained from family if available, nursing notes, old chart and ekg, as well as notes from prior ED visits. Patient presented for chest pain and difficulty breathing, we will assess with labs and imaging as indicated at this time.   Procedures ____________________________________________   LABS (pertinent positives/negatives)  Labs Reviewed  BASIC METABOLIC PANEL - Abnormal; Notable for the following components:      Result Value   Potassium 3.3 (*)    Glucose, Bld 132 (*)    BUN 21 (*)    Calcium 8.8 (*)    All other components within normal limits  CBC - Abnormal; Notable for the following components:   RBC 3.23 (*)    Hemoglobin 10.5 (*)    HCT 30.9 (*)    RDW 15.3 (*)    All other components within normal limits  TROPONIN I - Abnormal; Notable for the following components:   Troponin I 0.03 (*)    All other components within normal limits  TROPONIN I    RADIOLOGY Chest x-ray IMPRESSION: No acute pneumonia nor CHF. Diffuse skeletal metastases are present. There is a calcified nodular density  which projects over the anterior aspect of the left seventh rib which likely reflects metastatic disease. IMPRESSION: 1. Negative for pulmonary embolus. 2. Noncalcified pulmonary nodules, stable. 3. Aortic atherosclerosis (ICD10-170.0). Severe coronary artery calcification. 4. Enlarged pulmonary arteries, indicative of pulmonary arterial hypertension. 5. Stable mild mediastinal adenopathy. 6. Widespread osseous metastatic disease, as before. ____________________________________________  DIFFERENTIAL DIAGNOSIS   Metastatic cancer, fracture, PE, pneumothorax, CHF, COPD, pneumonia  FINAL ASSESSMENT AND PLAN  Chest pain, shortness of breath, metastatic cancer   Plan: Patient had presented for chest pain and shortness of breath. Patient's labs are at his baseline. Patient's imaging reveals skeletal metastasis.   Initially there was some concern for PE given his cancer history but CT just revealed widespread metastatic disease which I think is the cause for his pain.  He will be discharged with pain medicine and advise close follow-up with his doctor.   Earleen Newport, MD   Note: This note was generated in part or whole with voice recognition software. Voice recognition is usually quite accurate but there are transcription errors that can and very often do occur. I apologize for any typographical errors that were not detected and corrected.     Earleen Newport, MD 06/18/17 1009

## 2017-06-18 NOTE — ED Notes (Signed)
Patient transported to CT 

## 2017-06-21 ENCOUNTER — Encounter: Payer: Self-pay | Admitting: *Deleted

## 2017-06-21 ENCOUNTER — Inpatient Hospital Stay: Payer: Medicare Other

## 2017-06-21 DIAGNOSIS — I10 Essential (primary) hypertension: Secondary | ICD-10-CM | POA: Diagnosis not present

## 2017-06-21 DIAGNOSIS — I313 Pericardial effusion (noninflammatory): Secondary | ICD-10-CM | POA: Diagnosis not present

## 2017-06-21 DIAGNOSIS — G4730 Sleep apnea, unspecified: Secondary | ICD-10-CM | POA: Diagnosis not present

## 2017-06-21 DIAGNOSIS — C61 Malignant neoplasm of prostate: Secondary | ICD-10-CM

## 2017-06-21 DIAGNOSIS — D649 Anemia, unspecified: Secondary | ICD-10-CM | POA: Diagnosis not present

## 2017-06-21 DIAGNOSIS — E119 Type 2 diabetes mellitus without complications: Secondary | ICD-10-CM | POA: Diagnosis not present

## 2017-06-21 DIAGNOSIS — Z79899 Other long term (current) drug therapy: Secondary | ICD-10-CM | POA: Diagnosis not present

## 2017-06-21 DIAGNOSIS — K4020 Bilateral inguinal hernia, without obstruction or gangrene, not specified as recurrent: Secondary | ICD-10-CM | POA: Diagnosis not present

## 2017-06-21 DIAGNOSIS — I712 Thoracic aortic aneurysm, without rupture: Secondary | ICD-10-CM | POA: Diagnosis not present

## 2017-06-21 DIAGNOSIS — C7951 Secondary malignant neoplasm of bone: Secondary | ICD-10-CM | POA: Diagnosis not present

## 2017-06-21 DIAGNOSIS — J449 Chronic obstructive pulmonary disease, unspecified: Secondary | ICD-10-CM | POA: Diagnosis not present

## 2017-06-21 DIAGNOSIS — I700 Atherosclerosis of aorta: Secondary | ICD-10-CM | POA: Diagnosis not present

## 2017-06-21 DIAGNOSIS — F17210 Nicotine dependence, cigarettes, uncomplicated: Secondary | ICD-10-CM | POA: Diagnosis not present

## 2017-06-21 LAB — CBC WITH DIFFERENTIAL/PLATELET
BASOS ABS: 0 10*3/uL (ref 0–0.1)
BASOS PCT: 1 %
Eosinophils Absolute: 0.1 10*3/uL (ref 0–0.7)
Eosinophils Relative: 2 %
HEMATOCRIT: 29.7 % — AB (ref 40.0–52.0)
HEMOGLOBIN: 10 g/dL — AB (ref 13.0–18.0)
Lymphocytes Relative: 19 %
Lymphs Abs: 1 10*3/uL (ref 1.0–3.6)
MCH: 32.1 pg (ref 26.0–34.0)
MCHC: 33.6 g/dL (ref 32.0–36.0)
MCV: 95.5 fL (ref 80.0–100.0)
Monocytes Absolute: 0.6 10*3/uL (ref 0.2–1.0)
Monocytes Relative: 12 %
NEUTROS ABS: 3.5 10*3/uL (ref 1.4–6.5)
NEUTROS PCT: 66 %
Platelets: 151 10*3/uL (ref 150–440)
RBC: 3.11 MIL/uL — ABNORMAL LOW (ref 4.40–5.90)
RDW: 15.1 % — ABNORMAL HIGH (ref 11.5–14.5)
WBC: 5.2 10*3/uL (ref 3.8–10.6)

## 2017-06-27 ENCOUNTER — Ambulatory Visit: Admission: RE | Admit: 2017-06-27 | Payer: Medicare Other | Source: Ambulatory Visit | Admitting: Radiation Oncology

## 2017-06-27 ENCOUNTER — Encounter
Admission: RE | Admit: 2017-06-27 | Discharge: 2017-06-27 | Disposition: A | Discharge status: Home / Self Care | Payer: Medicare Other | Source: Ambulatory Visit | Attending: Radiation Oncology | Admitting: Radiation Oncology

## 2017-06-27 ENCOUNTER — Ambulatory Visit: Payer: Medicare Other

## 2017-06-27 DIAGNOSIS — C7951 Secondary malignant neoplasm of bone: Secondary | ICD-10-CM | POA: Insufficient documentation

## 2017-06-27 DIAGNOSIS — C61 Malignant neoplasm of prostate: Secondary | ICD-10-CM | POA: Diagnosis present

## 2017-06-27 MED ORDER — RADIUM RA 223 DICHLORIDE 30 MCCI/ML IV SOLN
161.0000 | Freq: Once | INTRAVENOUS | Status: AC
  Administered 2017-06-27: 161.2 via INTRAVENOUS

## 2017-06-29 NOTE — Progress Notes (Signed)
Radiation Oncology Xofigo first injection  Name: Michael Mathews   Date:   06/27/2017 MRN:  [id number] DOB: [birth year]    This 72 y.o. male presents to the clinic today for first Xofigo injection.  REFERRING PROVIDER: Noreene Filbert, MD  HPI: Patient is a 72 year old male with known castrate resistant prostate cancer who is seen today for his first macro X injection. He is doing well still some active pain.(..He specifically denies any problems with ambulation at this time.  COMPLICATIONS OF TREATMENT: none  FOLLOW UP COMPLIANCE: keeps appointments   PHYSICAL EXAM:  There were no vitals taken for this visit. Well-developed well-nourished patient in NAD. HEENT reveals PERLA, EOMI, discs not visualized.  Oral cavity is clear. No oral mucosal lesions are identified. Neck is clear without evidence of cervical or supraclavicular adenopathy. Lungs are clear to A&P. Cardiac examination is essentially unremarkable with regular rate and rhythm without murmur rub or thrill. Abdomen is benign with no organomegaly or masses noted. Motor sensory and DTR levels are equal and symmetric in the upper and lower extremities. Cranial nerves II through XII are grossly intact. Proprioception is intact. No peripheral adenopathy or edema is identified. No motor or sensory levels are noted. Crude visual fields are within normal range.  RADIOLOGY RESULTS: No current films for review  PLAN: Peripheral line was started on the patient and 20 cc of normal saline were passed to make sure there was patency of the lines. Trudi Ida was administered over a 5 minute infusion push by nuclear medicine technologist supervised by radiation oncologist. After completion of IV push of Xofigo 30 cc an additional saline were passed through the peripheral line. Oral lines syringes drapes and original container of Xofigo were then taken to nuclear medicine for storage. Patient tolerated the procedure well without side effect or complaint.  Patient has anti-emetic medication. Have scheduled the patient for a three-week followup to check on his counts. Patient is to call sooner with any side effects or complaints.    Noreene Filbert, MD

## 2017-07-12 IMAGING — CT CT CHEST W/ CM
1 series · 14 of 34 positions shown, 18 images · IV contrast (iopamidol)
Comparison: 07/07/2016

CLINICAL DATA: Follow-up left upper lobe lung nodule

EXAM:
CT CHEST WITH CONTRAST
TECHNIQUE: Multidetector CT imaging of the chest was performed during
intravenous contrast administration.
CONTRAST:  75mL [id number] IOPAMIDOL ([id number]) INJECTION 61%

[Series 3: axial st · axial · 0.82mm/px · z∈[+1222,+1476]mm · 14 of 149 slices shown, 18 images]
[im 11/149  mediastinal]
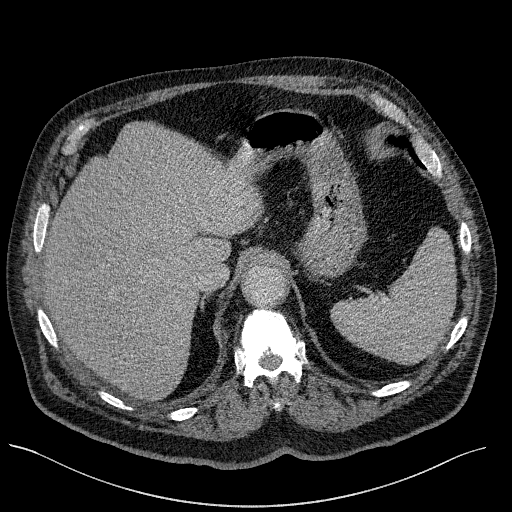
[im 11/149  lung]
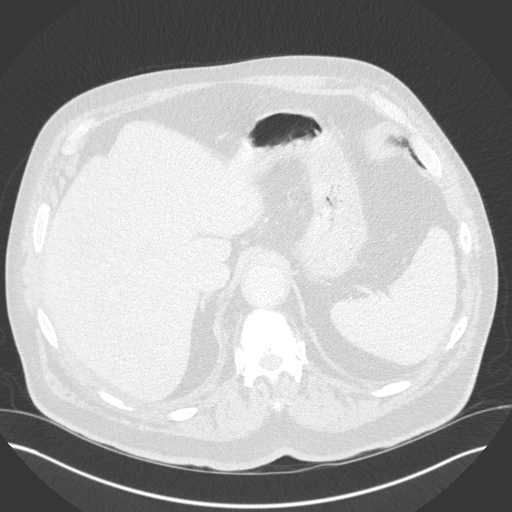
[im 22/149  lung]
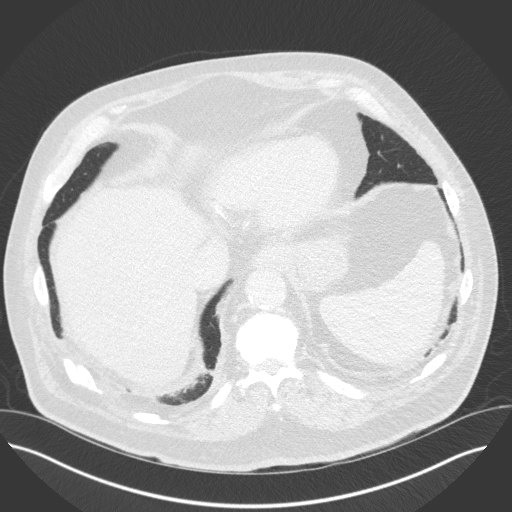
[im 30/149  lung]
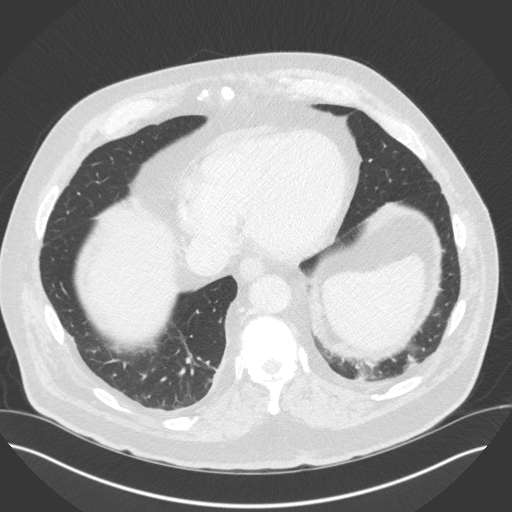
[im 44/149  lung]
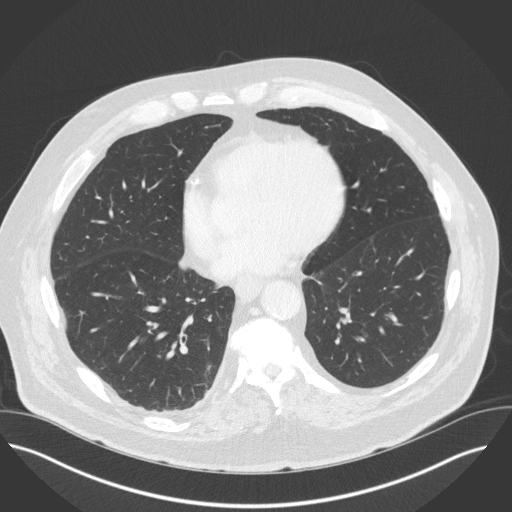
[im 55/149  mediastinal]
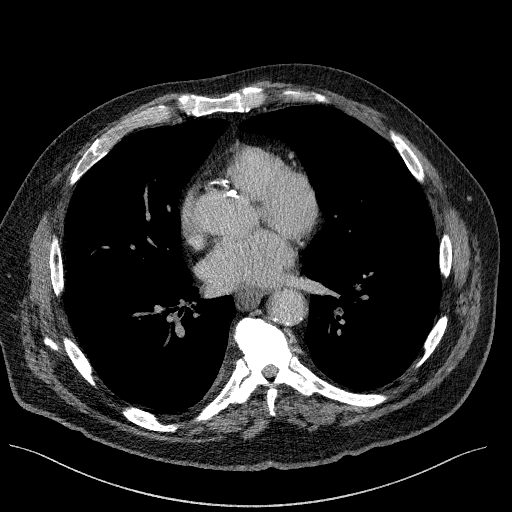
[im 55/149  lung]
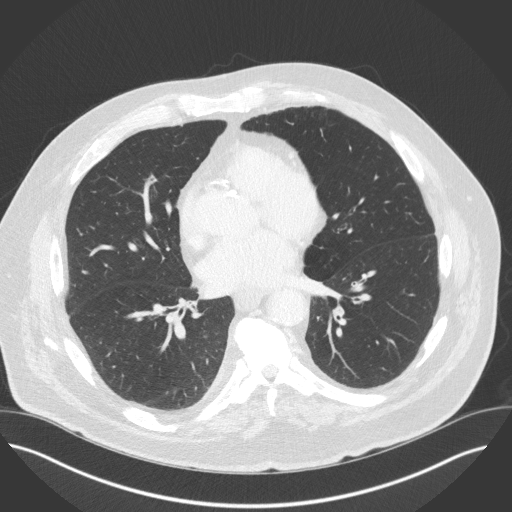
[im 61/149  lung]
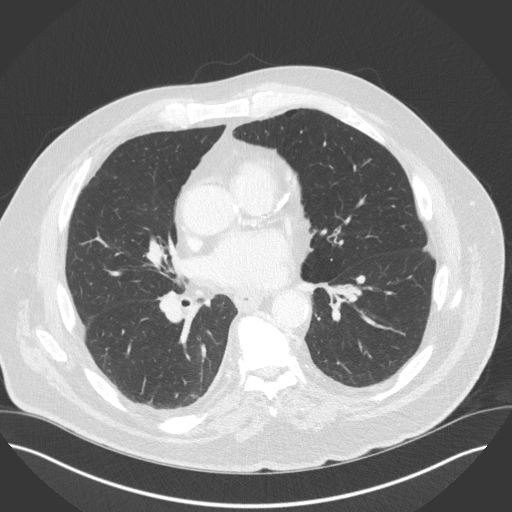
[im 70/149  lung]
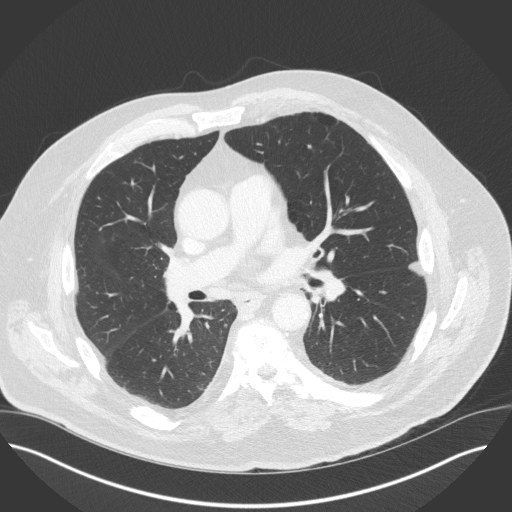
[im 80/149  lung]
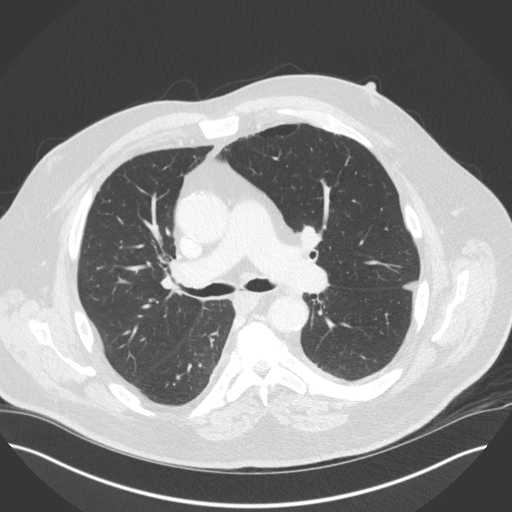
[im 88/149  mediastinal]
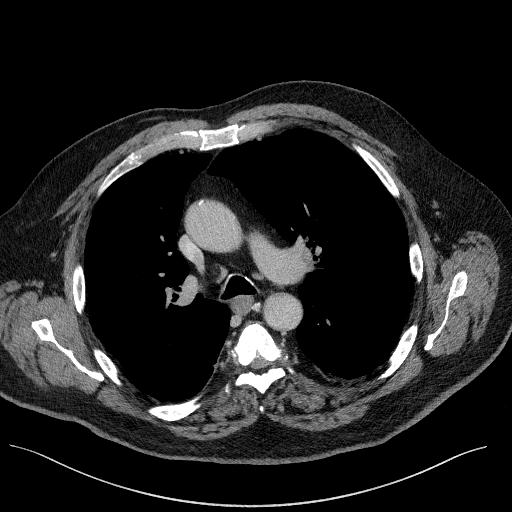
[im 88/149  lung]
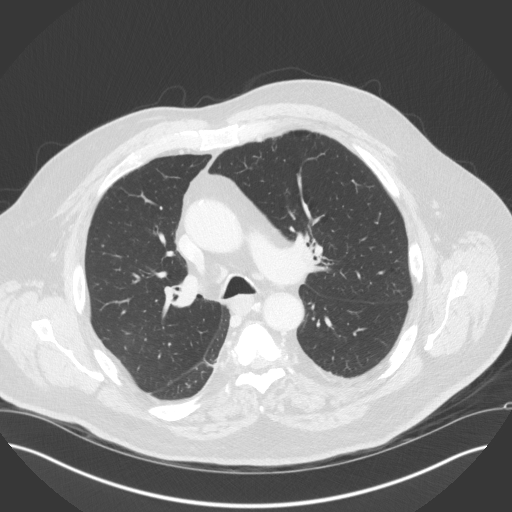
[im 94/149  lung]
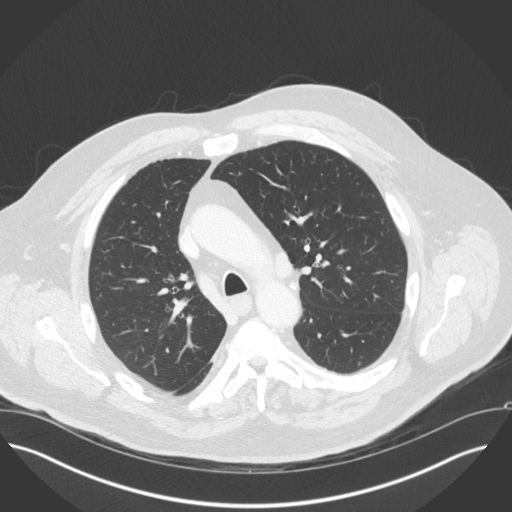
[im 110/149  lung]
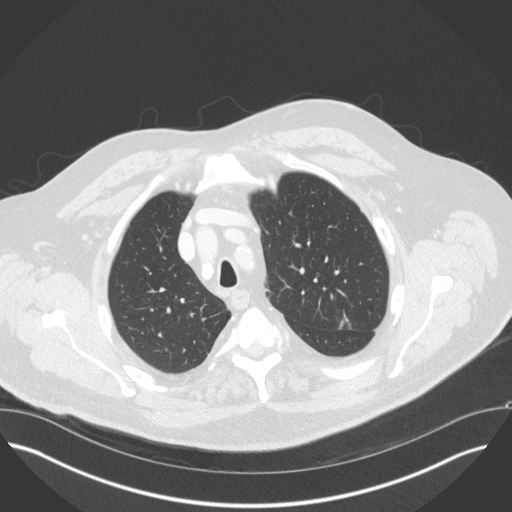
[im 119/149  lung]
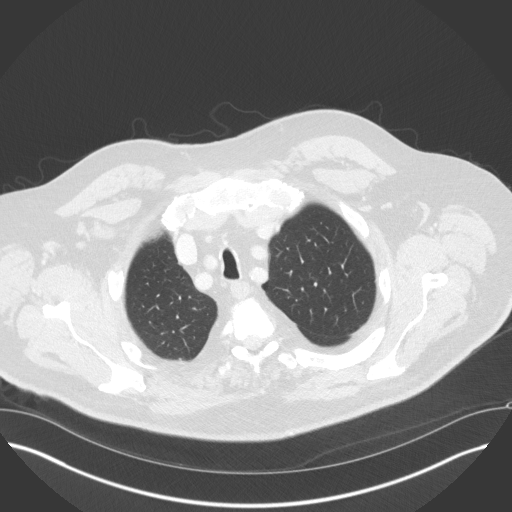
[im 127/149  mediastinal]
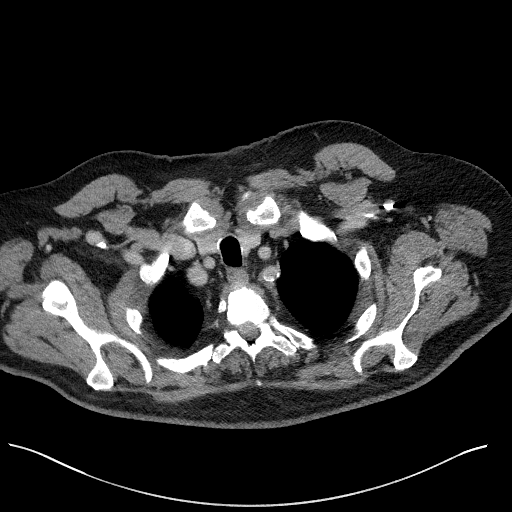
[im 127/149  lung]
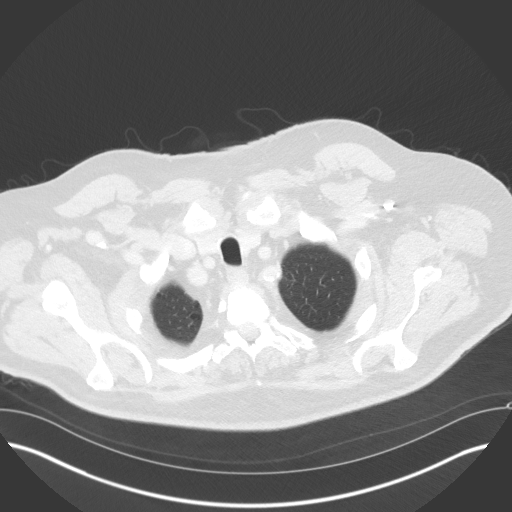
[im 138/149  lung]
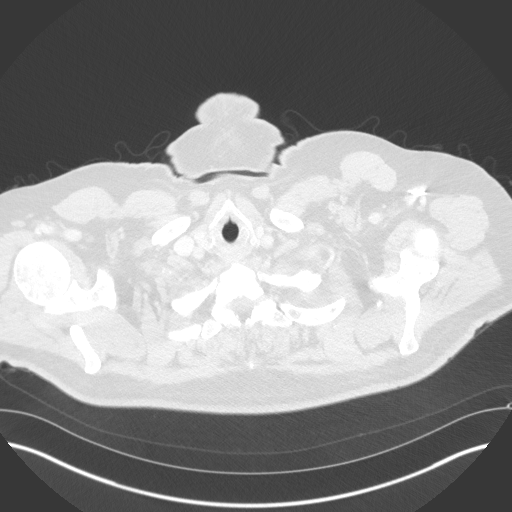

[14 of 34 positions shown; findings below may reference images not displayed]

FINDINGS: Cardiovascular: Aortic calcifications are noted with evidence of
dilatation of the ascending aorta 4.2 cm. This is relatively stable
from the prior exam. Coronary calcifications are noted as well. No
cardiac enlargement is seen. The pulmonary artery as visualized is
within normal limits.

Mediastinum/Nodes: The thoracic inlet is within normal limits.
Stable AP window node measuring 12 mm this is stable from the prior
exam. Few calcified hilar lymph nodes are noted on the right
consistent with prior granulomatous disease. The esophagus as
visualized is within normal limits.

Lungs/Pleura: Right lung is well aerated. A stable subpleural nodule
is noted in the upper lobe best seen on image number are 43 of
series 4. A few scattered calcified granulomas are identified. The
left lung again demonstrates an upper lobe nodule along the major
fissure. It now measures 10 x 8 mm which is a slight decrease in
size when compared with the prior exam.

Upper Abdomen: Visualized upper abdomen reveals no acute
abnormality.

Musculoskeletal: The osseous structures again demonstrate multifocal
areas of increased sclerosis consistent with metastatic disease.
This is most prevalent within the thoracic spine but also involves
the ribcage sternum and bilateral humeri. Sclerotic focus within the
manubrium is also seen. The disease within the spine has progressed
when compared with the prior exam.
IMPRESSION: Progression in osseous metastatic disease when compared with the
prior exam.

Left upper lobe nodule slightly smaller in size as described.
Followup in 6 months is recommended to assess for stability.

Changes of prior granulomatous disease.

Stable thoracic aortic aneurysm. Recommend annual imaging followup
by CTA or MRA. This recommendation follows 2868
ACCF/AHA/AATS/ACR/ASA/SCA/GADDIS/KAM WAN/RUDI/LIENAD Guidelines for the
Diagnosis and Management of Patients with Thoracic Aortic Disease.
Circulation. 2868; 121: [id number]

Aortic Atherosclerosis (5NPFT-UXQ.Q) and Emphysema (5NPFT-1R0.T).

Aortic aneurysm NOS (5NPFT-NKF.6).

## 2017-07-19 ENCOUNTER — Other Ambulatory Visit: Payer: Self-pay | Admitting: *Deleted

## 2017-07-19 ENCOUNTER — Inpatient Hospital Stay: Payer: Medicare Other | Attending: Internal Medicine | Admitting: Oncology

## 2017-07-19 ENCOUNTER — Ambulatory Visit: Payer: Medicare Other | Admitting: Internal Medicine

## 2017-07-19 ENCOUNTER — Inpatient Hospital Stay: Payer: Medicare Other

## 2017-07-19 ENCOUNTER — Encounter: Payer: Self-pay | Admitting: Oncology

## 2017-07-19 VITALS — BP 174/69 | HR 57 | Temp 97.9°F | Resp 18 | Ht 70.0 in | Wt 239.9 lb

## 2017-07-19 DIAGNOSIS — D696 Thrombocytopenia, unspecified: Secondary | ICD-10-CM | POA: Insufficient documentation

## 2017-07-19 DIAGNOSIS — M1990 Unspecified osteoarthritis, unspecified site: Secondary | ICD-10-CM | POA: Diagnosis not present

## 2017-07-19 DIAGNOSIS — D649 Anemia, unspecified: Secondary | ICD-10-CM | POA: Diagnosis not present

## 2017-07-19 DIAGNOSIS — E119 Type 2 diabetes mellitus without complications: Secondary | ICD-10-CM | POA: Diagnosis not present

## 2017-07-19 DIAGNOSIS — G4730 Sleep apnea, unspecified: Secondary | ICD-10-CM | POA: Diagnosis not present

## 2017-07-19 DIAGNOSIS — I2510 Atherosclerotic heart disease of native coronary artery without angina pectoris: Secondary | ICD-10-CM | POA: Diagnosis not present

## 2017-07-19 DIAGNOSIS — J449 Chronic obstructive pulmonary disease, unspecified: Secondary | ICD-10-CM | POA: Diagnosis not present

## 2017-07-19 DIAGNOSIS — C61 Malignant neoplasm of prostate: Secondary | ICD-10-CM | POA: Insufficient documentation

## 2017-07-19 DIAGNOSIS — I10 Essential (primary) hypertension: Secondary | ICD-10-CM | POA: Insufficient documentation

## 2017-07-19 DIAGNOSIS — I712 Thoracic aortic aneurysm, without rupture: Secondary | ICD-10-CM | POA: Insufficient documentation

## 2017-07-19 DIAGNOSIS — Z79899 Other long term (current) drug therapy: Secondary | ICD-10-CM | POA: Insufficient documentation

## 2017-07-19 DIAGNOSIS — F17210 Nicotine dependence, cigarettes, uncomplicated: Secondary | ICD-10-CM | POA: Insufficient documentation

## 2017-07-19 DIAGNOSIS — K4020 Bilateral inguinal hernia, without obstruction or gangrene, not specified as recurrent: Secondary | ICD-10-CM | POA: Diagnosis not present

## 2017-07-19 DIAGNOSIS — C7951 Secondary malignant neoplasm of bone: Principal | ICD-10-CM

## 2017-07-19 DIAGNOSIS — D509 Iron deficiency anemia, unspecified: Secondary | ICD-10-CM

## 2017-07-19 LAB — CBC WITH DIFFERENTIAL/PLATELET
BASOS ABS: 0 10*3/uL (ref 0–0.1)
BASOS PCT: 1 %
EOS PCT: 1 %
Eosinophils Absolute: 0.1 10*3/uL (ref 0–0.7)
HCT: 25.9 % — ABNORMAL LOW (ref 40.0–52.0)
Hemoglobin: 8.7 g/dL — ABNORMAL LOW (ref 13.0–18.0)
Lymphocytes Relative: 17 %
Lymphs Abs: 0.7 10*3/uL — ABNORMAL LOW (ref 1.0–3.6)
MCH: 32.3 pg (ref 26.0–34.0)
MCHC: 33.4 g/dL (ref 32.0–36.0)
MCV: 96.8 fL (ref 80.0–100.0)
MONO ABS: 0.7 10*3/uL (ref 0.2–1.0)
Monocytes Relative: 17 %
NEUTROS ABS: 2.7 10*3/uL (ref 1.4–6.5)
Neutrophils Relative %: 64 %
PLATELETS: 116 10*3/uL — AB (ref 150–440)
RBC: 2.68 MIL/uL — ABNORMAL LOW (ref 4.40–5.90)
RDW: 16 % — AB (ref 11.5–14.5)
WBC: 4.3 10*3/uL (ref 3.8–10.6)

## 2017-07-19 LAB — COMPREHENSIVE METABOLIC PANEL
ALBUMIN: 3.4 g/dL — AB (ref 3.5–5.0)
ALK PHOS: 608 U/L — AB (ref 38–126)
ALT: 16 U/L — ABNORMAL LOW (ref 17–63)
ANION GAP: 9 (ref 5–15)
AST: 28 U/L (ref 15–41)
BILIRUBIN TOTAL: 0.7 mg/dL (ref 0.3–1.2)
BUN: 17 mg/dL (ref 6–20)
CALCIUM: 8.4 mg/dL — AB (ref 8.9–10.3)
CO2: 27 mmol/L (ref 22–32)
Chloride: 104 mmol/L (ref 101–111)
Creatinine, Ser: 0.76 mg/dL (ref 0.61–1.24)
GFR calc Af Amer: >60 mL/min (ref >60)
GFR calc non Af Amer: >60 mL/min (ref >60)
GLUCOSE: 97 mg/dL (ref 65–99)
Potassium: 3.7 mmol/L (ref 3.5–5.1)
Sodium: 140 mmol/L (ref 135–145)
TOTAL PROTEIN: 6.8 g/dL (ref 6.5–8.1)

## 2017-07-19 NOTE — Progress Notes (Deleted)
[place] OFFICE PROGRESS NOTE  Patient Care Team: [name], MD as PCP - General (Family Medicine)  Cancer Staging Prostate cancer metastatic to bone [facility]) Staging form: Bone - Appendicular Skeleton, Trunk, Skull, and Facial Bones, AJCC 8th Edition - Clinical stage from 07/28/2016: Stage IVB (cTX, cNX, cM1b) - Signed by Sindy Guadeloupe, MD on 07/28/2016     No history exists.     This is my first interaction with the patient as patient's primary oncologist has been Dr.Rao. I reviewed the patient's prior charts/pertinent labs/imaging in detail; findings are summarized above.     INTERVAL HISTORY:  Michael Mathews 72 y.o.  male pleasant patient above history of  REVIEW OF SYSTEMS:  A complete 10 point review of system is done which is negative except mentioned above/history of present illness.   PAST MEDICAL HISTORY :  Past Medical History:  Diagnosis Date  . Arthritis   . Cancer [facility])    prostate  . COPD (chronic obstructive pulmonary disease) ([place])   . Coronary artery disease   . Cough    chronic  . Diabetes mellitus without complication ([place])   . Edema   . Elevated PSA   . Headache   . Hypertension   . Orthopnea   . Oxygen deficit    o2 prn  . Prostate cancer ([place])   . Shortness of breath dyspnea   . Sleep apnea    cpap    PAST SURGICAL HISTORY :   Past Surgical History:  Procedure Laterality Date  . CARDIAC CATHETERIZATION  2014  . COLONOSCOPY    . CYSTOSCOPY W/ RETROGRADES Bilateral 10/18/2016   Procedure: CYSTOSCOPY WITH RETROGRADE PYELOGRAM;  Surgeon: Hollice Espy, MD;  Location: [facility] ORS;  Service: Urology;  Laterality: Bilateral;  . EYE SURGERY    . KNEE ARTHROSCOPY    . TRANSURETHRAL RESECTION OF BLADDER TUMOR N/A 10/18/2016   Procedure: TRANSURETHRAL RESECTION OF BLADDER TUMOR (TURBT);  Surgeon: Hollice Espy, MD;  Location: [facility] ORS;  Service: Urology;  Laterality: N/A;  . TRANSURETHRAL RESECTION OF PROSTATE N/A 10/18/2016   Procedure: TRANSURETHRAL RESECTION OF THE PROSTATE (TURP) CHANNEL TURP;  Surgeon: Hollice Espy, MD;  Location: [facility] ORS;  Service: Urology;  Laterality: N/A;    FAMILY HISTORY :   Family History  Problem Relation Age of Onset  . CVA Mother   . CAD Father     SOCIAL HISTORY:   Social History   Tobacco Use  . Smoking status: Current Every Day Smoker    Packs/day: 0.50    Years: 55.00    Pack years: 27.50    Types: Cigarettes  . Smokeless tobacco: Never Used  Substance Use Topics  . Alcohol use: No  . Drug use: No    ALLERGIES:  has No Known Allergies.  MEDICATIONS:  Current Outpatient Medications  Medication Sig Dispense Refill  . abiraterone Acetate (ZYTIGA) 250 MG tablet Take 4 tablets (1,000 mg total) by mouth daily. Take on an empty stomach 1 hour before or 2 hours after a meal (Patient not taking: Reported on 05/26/2017) 120 tablet 3  . aspirin EC 325 MG tablet Take 325 mg by mouth every morning.    Marland Kitchen atorvastatin (LIPITOR) 40 MG tablet Take 1 tablet (40 mg total) by mouth daily. 30 tablet 0  . cloNIDine (CATAPRES) 0.2 MG tablet Take 1 tablet (0.2 mg total) by mouth 2 (two) times daily. 60 tablet 2  . ferrous sulfate 325 (65 FE) MG EC tablet  Take 1 tablet (325 mg total) by mouth 2 (two) times daily with a meal. 60 tablet 3  . finasteride (PROSCAR) 5 MG tablet Take 5 mg by mouth daily.    . Fluticasone-Salmeterol (ADVAIR) 250-50 MCG/DOSE AEPB Inhale 1 puff into the lungs 2 (two) times daily.    Marland Kitchen GLIPIZIDE XL 10 MG 24 hr tablet Take 10 mg by mouth 2 (two) times daily.     Marland Kitchen ketoconazole (NIZORAL) 2 % cream Apply 1 application topically daily as needed.     Marland Kitchen losartan (COZAAR) 25 MG tablet Take 25 mg by mouth daily.    . metFORMIN (GLUCOPHAGE) 500 MG tablet Take 500 mg by mouth 2 (two) times daily with a meal.    . metoprolol (LOPRESSOR) 50 MG tablet Take 50 mg by mouth 2 (two) times daily.    Marland Kitchen oxyCODONE-acetaminophen (PERCOCET) 5-325 MG tablet Take 1-2 tablets by mouth  every 8 (eight) hours as needed. 30 tablet 0  . oxyCODONE-acetaminophen (ROXICET) 5-325 MG tablet Take 1 tablet by mouth every 4 (four) hours as needed for severe pain. (Patient not taking: Reported on 05/26/2017) 20 tablet 0  . predniSONE (DELTASONE) 5 MG tablet Take 1 tablet (5 mg total) 2 (two) times daily with a meal by mouth. (Patient not taking: Reported on 05/26/2017) 180 tablet 0  . tamsulosin (FLOMAX) 0.4 MG CAPS capsule Take 1 capsule (0.4 mg total) by mouth daily. 90 capsule 3  . vitamin B-12 (CYANOCOBALAMIN) 1000 MCG tablet Take 1 tablet (1,000 mcg total) by mouth daily. 30 tablet 3   No current facility-administered medications for this visit.     PHYSICAL EXAMINATION: ECOG PERFORMANCE STATUS: {CHL ONC ECOG PS:[id number]}  There were no vitals taken for this visit.  There were no vitals filed for this visit.  GENERAL: Well-nourished well-developed; Alert, no distress and comfortable.   *** EYES: no pallor or icterus OROPHARYNX: no thrush or ulceration; good dentition  NECK: supple, no masses felt LYMPH:  no palpable lymphadenopathy in the cervical, axillary or inguinal regions LUNGS: clear to auscultation and  No wheeze or crackles HEART/CVS: regular rate & rhythm and no murmurs; No lower extremity edema ABDOMEN:abdomen soft, non-tender and normal bowel sounds Musculoskeletal:no cyanosis of digits and no clubbing  PSYCH: alert & oriented x 3 with fluent speech NEURO: no focal motor/sensory deficits SKIN:  no rashes or significant lesions  LABORATORY DATA:  I have reviewed the data as listed    Component Value Date/Time   NA 141 06/18/2017 0649   NA 140 11/28/2013 1159   K 3.3 (L) 06/18/2017 0649   K 3.7 11/28/2013 1159   CL 103 06/18/2017 0649   CL 107 11/28/2013 1159   CO2 27 06/18/2017 0649   CO2 27 11/28/2013 1159   GLUCOSE 132 (H) 06/18/2017 0649   GLUCOSE 94 11/28/2013 1159   BUN 21 (H) 06/18/2017 0649   BUN 16 11/28/2013 1159   CREATININE 0.79  06/18/2017 0649   CREATININE 1.34 (H) 11/28/2013 1159   CALCIUM 8.8 (L) 06/18/2017 0649   CALCIUM 8.6 11/28/2013 1159   PROT 7.4 05/17/2017 1430   ALBUMIN 3.6 05/17/2017 1430   AST 21 05/17/2017 1430   ALT 15 (L) 05/17/2017 1430   ALKPHOS 969 (H) 05/17/2017 1430   BILITOT 0.7 05/17/2017 1430   GFRNONAA >60 06/18/2017 0649   GFRNONAA 54 (L) 11/28/2013 1159   GFRAA >60 06/18/2017 0649   GFRAA >60 11/28/2013 1159    No results found for: SPEP, UPEP  Lab  Results  Component Value Date   WBC 5.2 06/21/2017   NEUTROABS 3.5 06/21/2017   HGB 10.0 (L) 06/21/2017   HCT 29.7 (L) 06/21/2017   MCV 95.5 06/21/2017   PLT 151 06/21/2017      Chemistry      Component Value Date/Time   NA 141 06/18/2017 0649   NA 140 11/28/2013 1159   K 3.3 (L) 06/18/2017 0649   K 3.7 11/28/2013 1159   CL 103 06/18/2017 0649   CL 107 11/28/2013 1159   CO2 27 06/18/2017 0649   CO2 27 11/28/2013 1159   BUN 21 (H) 06/18/2017 0649   BUN 16 11/28/2013 1159   CREATININE 0.79 06/18/2017 0649   CREATININE 1.34 (H) 11/28/2013 1159      Component Value Date/Time   CALCIUM 8.8 (L) 06/18/2017 0649   CALCIUM 8.6 11/28/2013 1159   ALKPHOS 969 (H) 05/17/2017 1430   AST 21 05/17/2017 1430   ALT 15 (L) 05/17/2017 1430   BILITOT 0.7 05/17/2017 1430       RADIOGRAPHIC STUDIES: I have personally reviewed the radiological images as listed and agreed with the findings in the report. No results found.   ASSESSMENT & PLAN:  No problem-specific Assessment & Plan notes found for this encounter.   No orders of the defined types were placed in this encounter.  All questions were answered. The patient knows to call the clinic with any problems, questions or concerns.      Cammie Sickle, MD 07/19/2017 9:26 AM

## 2017-07-20 NOTE — Progress Notes (Signed)
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 patient is 72 year old male with a past medical history significant for hypertension diabetes and COPD among other medical problems. He was admitted to the hospital in October 2017 with some symptoms of dizziness and abdominal pain. CT angiogram abdomen incidentally showed sclerotic metastatic disease throughout the visualized spine.   2. This was followed by an MRI of the lumbar spine which showed multifocal T1 and T2 hypointense lesions with areas of increased signal on STIR sequence are seen in multiple vertebral bodies. Largest lesions are in T11 T12 L1 and in the imaged sacrum. This is consistent with metastatic disease. Primary lesion is not identified. MRI brain showed no acute intracranial abnormality.   3. Patient was noted to have an elevated PSA and was referred to urology as an outpatient. He was recently seen by Dr. Tresa Moore on 05/29/2016. PSA was repeated at that time which came back elevated at 10.3. 3 months over the value was 10.81.  4. Bone scan on 07/07/16 showed : Widespread radiotracer uptake over the axial and appendicular skeleton as described compatible with metastatic disease and correlating with sclerotic lesions on Ct. CT chest on 07/07/16 showed: Scattered sclerotic osseous metastatic lesions throughout spine, pelvis, proximal femora, ribs, and manubrium. Prostatic enlargement with thickened bladder wall question related to chronic bladder outlet obstruction.  Spiculated 12 x 11 x 7 mm LEFT upper lobe nodule question  primary pulmonary neoplasm. Stable nonspecific small LEFT adrenal nodule. Single minimally enlarged AP window lymph node. BILATERAL inguinal hernias containing fat. 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 Recommend annual imaging followup by CTA or MRA.   5. Anemia work up from 06/20/16 was as follows: CBC showed white count of 5.2, H&H of 8.1/25 and a platelet count of 164. CMP showed elevated alkaline phosphatase of 391. Multiple myeloma panel showed normal quantitative immunoglobulins and no monoclonal M protein.Vitamin B12 level was low normal at 225. LDH was mildly elevated at 234. Ferritin was low normal at 27. Reticulocyte count was 2.4% inappropriately low for the degree of anemia. Haptoglobin was elevated at 273.  6. Patients case was discussed at tumor board and plan was to watch the lung nodule with scans as it was in a difficult area to biopsy. CT guided bone biopsy was obtained which showed metastatic prostate cancer for which he is on lupron  7. Given that he does not have visceral or lymph node metastasis, docetaxel/zytigawas not started. Also patient lives alone and does not have a good social support or means of communication. Those options to be consideredat progression.   8. Bone scan on 12/22/2016 showed significantly worsening osseous metastatic disease. Left upper lobe nodule was slightly smaller in size. Zytiga was added in setting of worsening bone disease along with prednisonein aug 2018. Lupron administered 02/13/2017.  9. Scans after 3 months of zytiga showed progression of bone mets. PSA remains under control. Case discussed at tumor board and plan was to hold zytiga and proceed with [facility] for 6 months followed by repeat scans. xofigo started in jan 2019  Interval history- Patient feels at his baseline. Back pain is well controlled with tylenol and patient  does not wish to take more medications. He has baseline copd and sob which is unchanged. He lives alone and is independent of his ADL's. Denies any recent falls  ECOG PS- 1 Pain scale- 0   Review of systems- Review of Systems  Constitutional: Positive for malaise/fatigue. Negative for chills, fever and weight loss.  HENT: Negative for congestion, ear discharge and nosebleeds.   Eyes: Negative for blurred vision.  Respiratory: Positive for shortness of breath. Negative for cough, hemoptysis, sputum production and wheezing.   Cardiovascular: Negative for chest pain, palpitations, orthopnea and claudication.  Gastrointestinal: Negative for abdominal pain, blood in stool, constipation, diarrhea, heartburn, melena, nausea and vomiting.  Genitourinary: Negative for dysuria, flank pain, frequency, hematuria and urgency.  Musculoskeletal: Positive for back pain. Negative for joint pain and myalgias.  Skin: Negative for rash.  Neurological: Negative for dizziness, tingling, focal weakness, seizures, weakness and headaches.  Endo/Heme/Allergies: Does not bruise/bleed easily.  Psychiatric/Behavioral: Negative for depression and suicidal ideas. The patient does not have insomnia.       No Known Allergies   Past Medical History:  Diagnosis Date  . Arthritis   . Cancer [facility])    prostate  . COPD (chronic obstructive pulmonary disease) ([place])   . Coronary artery disease   . Cough    chronic  . Diabetes mellitus without complication ([place])   . Edema   . Elevated PSA   . Headache   . Hypertension   . Orthopnea   . Oxygen deficit    o2 prn  . Prostate cancer ([place])   . Shortness of breath dyspnea   . Sleep apnea    cpap     Past Surgical History:  Procedure Laterality Date  . CARDIAC CATHETERIZATION  2014  . COLONOSCOPY    . CYSTOSCOPY W/ RETROGRADES Bilateral 10/18/2016   Procedure: CYSTOSCOPY WITH RETROGRADE PYELOGRAM;  Surgeon: Hollice Espy, MD;  Location: [facility] ORS;  Service:  Urology;  Laterality: Bilateral;  . EYE SURGERY    . KNEE ARTHROSCOPY    . TRANSURETHRAL RESECTION OF BLADDER TUMOR N/A 10/18/2016   Procedure: TRANSURETHRAL RESECTION OF BLADDER TUMOR (TURBT);  Surgeon: Hollice Espy, MD;  Location: [facility] ORS;  Service: Urology;  Laterality: N/A;  . TRANSURETHRAL RESECTION OF PROSTATE N/A 10/18/2016   Procedure: TRANSURETHRAL RESECTION OF THE PROSTATE (TURP) CHANNEL TURP;  Surgeon: Hollice Espy, MD;  Location: [facility] ORS;  Service: Urology;  Laterality: N/A;    Social History   Socioeconomic History  . Marital status: Single    Spouse name: Not on file  . Number of children: Not on file  . Years of education: Not on file  . Highest education level: Not on file  Social Needs  . Financial resource strain: Not on file  . Food insecurity - worry: Not on file  . Food insecurity - inability: Not on file  . Transportation needs - medical: Not on file  . Transportation needs - non-medical: Not on file  Occupational History  . Not on file  Tobacco Use  . Smoking status: Current Every Day Smoker    Packs/day: 0.50    Years: 55.00    Pack years: 27.50    Types: Cigarettes  . Smokeless tobacco: Never Used  Substance and Sexual Activity  . Alcohol use: No  . Drug use: No  . Sexual activity: Not on file  Other Topics Concern  . Not  on file  Social History Narrative  . Not on file    Family History  Problem Relation Age of Onset  . CVA Mother   . CAD Father      Current Outpatient Medications:  .  abiraterone Acetate (ZYTIGA) 250 MG tablet, Take 4 tablets (1,000 mg total) by mouth daily. Take on an empty stomach 1 hour before or 2 hours after a meal, Disp: 120 tablet, Rfl: 3 .  aspirin EC 325 MG tablet, Take 325 mg by mouth every morning., Disp: , Rfl:  .  atorvastatin (LIPITOR) 40 MG tablet, Take 1 tablet (40 mg total) by mouth daily., Disp: 30 tablet, Rfl: 0 .  cloNIDine (CATAPRES) 0.2 MG tablet, Take 1 tablet (0.2 mg total) by mouth 2 (two)  times daily., Disp: 60 tablet, Rfl: 2 .  ferrous sulfate 325 (65 FE) MG EC tablet, Take 1 tablet (325 mg total) by mouth 2 (two) times daily with a meal., Disp: 60 tablet, Rfl: 3 .  finasteride (PROSCAR) 5 MG tablet, Take 5 mg by mouth daily., Disp: , Rfl:  .  Fluticasone-Salmeterol (ADVAIR) 250-50 MCG/DOSE AEPB, Inhale 1 puff into the lungs 2 (two) times daily., Disp: , Rfl:  .  GLIPIZIDE XL 10 MG 24 hr tablet, Take 10 mg by mouth 2 (two) times daily. , Disp: , Rfl:  .  ketoconazole (NIZORAL) 2 % cream, Apply 1 application topically daily as needed. , Disp: , Rfl:  .  losartan (COZAAR) 25 MG tablet, Take 25 mg by mouth daily., Disp: , Rfl:  .  metFORMIN (GLUCOPHAGE) 500 MG tablet, Take 500 mg by mouth 2 (two) times daily with a meal., Disp: , Rfl:  .  metoprolol (LOPRESSOR) 50 MG tablet, Take 50 mg by mouth 2 (two) times daily., Disp: , Rfl:  .  oxyCODONE-acetaminophen (PERCOCET) 5-325 MG tablet, Take 1-2 tablets by mouth every 8 (eight) hours as needed., Disp: 30 tablet, Rfl: 0 .  tamsulosin (FLOMAX) 0.4 MG CAPS capsule, Take 1 capsule (0.4 mg total) by mouth daily., Disp: 90 capsule, Rfl: 3 .  vitamin B-12 (CYANOCOBALAMIN) 1000 MCG tablet, Take 1 tablet (1,000 mcg total) by mouth daily., Disp: 30 tablet, Rfl: 3 .  oxyCODONE-acetaminophen (ROXICET) 5-325 MG tablet, Take 1 tablet by mouth every 4 (four) hours as needed for severe pain. (Patient not taking: Reported on 05/26/2017), Disp: 20 tablet, Rfl: 0 .  predniSONE (DELTASONE) 5 MG tablet, Take 1 tablet (5 mg total) 2 (two) times daily with a meal by mouth. (Patient not taking: Reported on 05/26/2017), Disp: 180 tablet, Rfl: 0  Physical exam:  Vitals:   07/19/17 1112  BP: (!) 174/69  Pulse: (!) 57  Resp: 18  Temp: 97.9 F (36.6 C)  TempSrc: Oral  Weight: 239 lb 14.4 oz (108.8 kg)  Height: _0  (1.778 m)   Physical Exam  Constitutional: He is oriented to person, place, and time and well-developed, well-nourished, and in no distress.    HENT:  Head: Normocephalic and atraumatic.  Eyes: EOM are normal. Pupils are equal, round, and reactive to light.  Neck: Normal range of motion.  Cardiovascular: Normal rate, regular rhythm and normal heart sounds.  Pulmonary/Chest:  Breath sounds diminished b/l/ pursed lip breathing  Abdominal: Soft. Bowel sounds are normal.  Neurological: He is alert and oriented to person, place, and time.  Skin: Skin is warm and dry.     CMP Latest Ref Rng & Units 07/19/2017  Glucose 65 - 99 mg/dL 97  BUN  6 - 20 mg/dL 17  Creatinine 0.61 - 1.24 mg/dL 0.76  Sodium 135 - 145 mmol/L 140  Potassium 3.5 - 5.1 mmol/L 3.7  Chloride 101 - 111 mmol/L 104  CO2 22 - 32 mmol/L 27  Calcium 8.9 - 10.3 mg/dL 8.4(L)  Total Protein 6.5 - 8.1 g/dL 6.8  Total Bilirubin 0.3 - 1.2 mg/dL 0.7  Alkaline Phos 38 - 126 U/L 608(H)  AST 15 - 41 U/L 28  ALT 17 - 63 U/L 16(L)   CBC Latest Ref Rng & Units 07/19/2017  WBC 3.8 - 10.6 K/uL 4.3  Hemoglobin 13.0 - 18.0 g/dL 8.7(L)  Hematocrit 40.0 - 52.0 % 25.9(L)  Platelets 150 - 440 K/uL 116(L)    No images are attached to the encounter.  Nm Xofigo Injection  Result Date: 06/27/2017  Trudi Ida was injected intravenously in Nuclear Medicine under the supervision of the attending radiologist     Assessment and plan- Patient is a 72 y.o. male with castrate resistant prostate cancer with progression on lupron alone and zytiga currently on monthly xofigo  Patient has received 1 dose of xofigo so far. He is more anemic and also has mild thrombocytopenia likely due to xofigo. Baseline hb is around 10. He has had anemia work up in the past and was consistent with anemia of chronic disease. If counts stay low, he may have to skip next dose of xofigo based on rad on assessment  Bone mets: not currently on zometa or xgeva as he is on xofigo. Will discuss this after he stops xofigo  rtc in 1 month with cbc/cmp/ iron studies b12 and folate and PSA. Also gets lupron at that time and  MD assesssment. zytiga currently on hold.   Visit Diagnosis 1. Prostate cancer metastatic to bone [facility])      Dr. Randa Evens, MD, MPH [facility] Pager- [phone] 07/20/2017 9:01 AM

## 2017-07-25 ENCOUNTER — Ambulatory Visit: Admission: RE | Admit: 2017-07-25 | Payer: Medicare Other | Source: Ambulatory Visit | Admitting: Radiation Oncology

## 2017-07-25 ENCOUNTER — Encounter
Admission: RE | Admit: 2017-07-25 | Discharge: 2017-07-25 | Disposition: A | Discharge status: Home / Self Care | Payer: Medicare Other | Source: Ambulatory Visit | Attending: Radiation Oncology | Admitting: Radiation Oncology

## 2017-07-25 DIAGNOSIS — C7951 Secondary malignant neoplasm of bone: Secondary | ICD-10-CM | POA: Diagnosis present

## 2017-07-25 DIAGNOSIS — C61 Malignant neoplasm of prostate: Secondary | ICD-10-CM | POA: Diagnosis not present

## 2017-07-25 MED ORDER — RADIUM RA 223 DICHLORIDE 30 MCCI/ML IV SOLN
167.8000 | Freq: Once | INTRAVENOUS | Status: AC
  Administered 2017-07-25: 167.8 via INTRAVENOUS

## 2017-08-15 ENCOUNTER — Ambulatory Visit: Payer: Medicare Other

## 2017-08-15 ENCOUNTER — Inpatient Hospital Stay: Payer: Medicare Other | Attending: Oncology

## 2017-08-15 ENCOUNTER — Encounter: Payer: Self-pay | Admitting: *Deleted

## 2017-08-15 ENCOUNTER — Ambulatory Visit: Payer: Medicare Other | Admitting: Oncology

## 2017-08-15 DIAGNOSIS — D509 Iron deficiency anemia, unspecified: Secondary | ICD-10-CM

## 2017-08-15 DIAGNOSIS — F17210 Nicotine dependence, cigarettes, uncomplicated: Secondary | ICD-10-CM | POA: Diagnosis not present

## 2017-08-15 DIAGNOSIS — D649 Anemia, unspecified: Secondary | ICD-10-CM

## 2017-08-15 DIAGNOSIS — G4730 Sleep apnea, unspecified: Secondary | ICD-10-CM | POA: Insufficient documentation

## 2017-08-15 DIAGNOSIS — C61 Malignant neoplasm of prostate: Secondary | ICD-10-CM | POA: Insufficient documentation

## 2017-08-15 DIAGNOSIS — I712 Thoracic aortic aneurysm, without rupture: Secondary | ICD-10-CM | POA: Diagnosis not present

## 2017-08-15 DIAGNOSIS — C7951 Secondary malignant neoplasm of bone: Secondary | ICD-10-CM | POA: Diagnosis not present

## 2017-08-15 DIAGNOSIS — J449 Chronic obstructive pulmonary disease, unspecified: Secondary | ICD-10-CM | POA: Diagnosis not present

## 2017-08-15 DIAGNOSIS — Z79818 Long term (current) use of other agents affecting estrogen receptors and estrogen levels: Secondary | ICD-10-CM | POA: Insufficient documentation

## 2017-08-15 DIAGNOSIS — K4020 Bilateral inguinal hernia, without obstruction or gangrene, not specified as recurrent: Secondary | ICD-10-CM | POA: Insufficient documentation

## 2017-08-15 DIAGNOSIS — I10 Essential (primary) hypertension: Secondary | ICD-10-CM | POA: Diagnosis not present

## 2017-08-15 DIAGNOSIS — E119 Type 2 diabetes mellitus without complications: Secondary | ICD-10-CM | POA: Diagnosis not present

## 2017-08-15 DIAGNOSIS — I2510 Atherosclerotic heart disease of native coronary artery without angina pectoris: Secondary | ICD-10-CM | POA: Insufficient documentation

## 2017-08-15 LAB — VITAMIN B12: Vitamin B-12: 823 pg/mL (ref 180–914)

## 2017-08-15 LAB — CBC WITH DIFFERENTIAL/PLATELET
BASOS ABS: 0 10*3/uL (ref 0–0.1)
Basophils Relative: 1 %
EOS ABS: 0 10*3/uL (ref 0–0.7)
Eosinophils Relative: 1 %
HCT: 28.6 % — ABNORMAL LOW (ref 40.0–52.0)
Hemoglobin: 9.6 g/dL — ABNORMAL LOW (ref 13.0–18.0)
LYMPHS PCT: 15 %
Lymphs Abs: 0.7 10*3/uL — ABNORMAL LOW (ref 1.0–3.6)
MCH: 32.8 pg (ref 26.0–34.0)
MCHC: 33.7 g/dL (ref 32.0–36.0)
MCV: 97.5 fL (ref 80.0–100.0)
MONOS PCT: 13 %
Monocytes Absolute: 0.6 10*3/uL (ref 0.2–1.0)
NEUTROS ABS: 3 10*3/uL (ref 1.4–6.5)
NEUTROS PCT: 70 %
PLATELETS: 128 10*3/uL — AB (ref 150–440)
RBC: 2.94 MIL/uL — AB (ref 4.40–5.90)
RDW: 16 % — AB (ref 11.5–14.5)
WBC: 4.3 10*3/uL (ref 3.8–10.6)

## 2017-08-15 LAB — COMPREHENSIVE METABOLIC PANEL
ALK PHOS: 323 U/L — AB (ref 38–126)
ALT: 20 U/L (ref 17–63)
ANION GAP: 8 (ref 5–15)
AST: 31 U/L (ref 15–41)
Albumin: 3.4 g/dL — ABNORMAL LOW (ref 3.5–5.0)
BUN: 20 mg/dL (ref 6–20)
CALCIUM: 8.9 mg/dL (ref 8.9–10.3)
CHLORIDE: 108 mmol/L (ref 101–111)
CO2: 24 mmol/L (ref 22–32)
Creatinine, Ser: 0.75 mg/dL (ref 0.61–1.24)
GFR calc non Af Amer: >60 mL/min (ref >60)
Glucose, Bld: 101 mg/dL — ABNORMAL HIGH (ref 65–99)
POTASSIUM: 4.2 mmol/L (ref 3.5–5.1)
SODIUM: 140 mmol/L (ref 135–145)
Total Bilirubin: 0.6 mg/dL (ref 0.3–1.2)
Total Protein: 7.1 g/dL (ref 6.5–8.1)

## 2017-08-15 LAB — FOLATE: Folate: 29 ng/mL (ref >5.9)

## 2017-08-15 LAB — IRON AND TIBC
Iron: 52 ug/dL (ref 45–182)
SATURATION RATIOS: 15 % — AB (ref 17.9–39.5)
TIBC: 338 ug/dL (ref 250–450)
UIBC: 286 ug/dL

## 2017-08-15 LAB — PSA: Prostatic Specific Antigen: 0.45 ng/mL (ref 0.00–4.00)

## 2017-08-15 LAB — FERRITIN: FERRITIN: 71 ng/mL (ref 24–336)

## 2017-08-16 ENCOUNTER — Inpatient Hospital Stay: Payer: Medicare Other

## 2017-08-16 ENCOUNTER — Inpatient Hospital Stay (HOSPITAL_BASED_OUTPATIENT_CLINIC_OR_DEPARTMENT_OTHER): Payer: Medicare Other | Admitting: Oncology

## 2017-08-16 VITALS — BP 172/63 | HR 61 | Temp 97.5°F | Resp 18 | Wt 240.0 lb

## 2017-08-16 DIAGNOSIS — C7951 Secondary malignant neoplasm of bone: Principal | ICD-10-CM

## 2017-08-16 DIAGNOSIS — Z79818 Long term (current) use of other agents affecting estrogen receptors and estrogen levels: Secondary | ICD-10-CM

## 2017-08-16 DIAGNOSIS — C61 Malignant neoplasm of prostate: Secondary | ICD-10-CM

## 2017-08-16 DIAGNOSIS — F17210 Nicotine dependence, cigarettes, uncomplicated: Secondary | ICD-10-CM | POA: Diagnosis not present

## 2017-08-16 DIAGNOSIS — D649 Anemia, unspecified: Secondary | ICD-10-CM | POA: Diagnosis not present

## 2017-08-16 DIAGNOSIS — D638 Anemia in other chronic diseases classified elsewhere: Secondary | ICD-10-CM

## 2017-08-16 MED ORDER — LEUPROLIDE ACETATE (3 MONTH) 22.5 MG IM KIT
22.5000 mg | PACK | INTRAMUSCULAR | Status: DC
  Administered 2017-08-16: 22.5 mg via INTRAMUSCULAR
  Filled 2017-08-16: qty 22.5

## 2017-08-17 ENCOUNTER — Encounter: Payer: Self-pay | Admitting: Oncology

## 2017-08-17 NOTE — Progress Notes (Signed)
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 patient is 72 year old male with a past medical history significant for hypertension diabetes and COPD among other medical problems. He was admitted to the hospital in October 2017 with some symptoms of dizziness and abdominal pain. CT angiogram abdomen incidentally showed sclerotic metastatic disease throughout the visualized spine.   2. This was followed by an MRI of the lumbar spine which showed multifocal T1 and T2 hypointense lesions with areas of increased signal on STIR sequence are seen in multiple vertebral bodies. Largest lesions are in T11 T12 L1 and in the imaged sacrum. This is consistent with metastatic disease. Primary lesion is not identified. MRI brain showed no acute intracranial abnormality.   3. Patient was noted to have an elevated PSA and was referred to urology as an outpatient. He was recently seen by Dr. Tresa Moore on 05/29/2016. PSA was repeated at that time which came back elevated at 10.3. 3 months over the value was 10.81.  4. Bone scan on 07/07/16 showed : Widespread radiotracer uptake over the axial and appendicular skeleton as described compatible with metastatic disease and correlating with sclerotic lesions on Ct. CT chest on 07/07/16 showed: Scattered sclerotic osseous metastatic lesions throughout spine, pelvis, proximal femora, ribs, and manubrium. Prostatic enlargement with thickened bladder wall question related to chronic bladder outlet obstruction.  Spiculated 12 x 11 x 7 mm LEFT upper lobe nodule question  primary pulmonary neoplasm. Stable nonspecific small LEFT adrenal nodule. Single minimally enlarged AP window lymph node. BILATERAL inguinal hernias containing fat. 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 Recommend annual imaging followup by CTA or MRA.   5. Anemia work up from 06/20/16 was as follows: CBC showed white count of 5.2, H&H of 8.1/25 and a platelet count of 164. CMP showed elevated alkaline phosphatase of 391. Multiple myeloma panel showed normal quantitative immunoglobulins and no monoclonal M protein.Vitamin B12 level was low normal at 225. LDH was mildly elevated at 234. Ferritin was low normal at 27. Reticulocyte count was 2.4% inappropriately low for the degree of anemia. Haptoglobin was elevated at 273.  6. Patients case was discussed at tumor board and plan was to watch the lung nodule with scans as it was in a difficult area to biopsy. CT guided bone biopsy was obtained which showed metastatic prostate cancer for which he is on lupron  7. Given that he does not have visceral or lymph node metastasis, docetaxel/zytigawas not started. Also patient lives alone and does not have a good social support or means of communication. Those options to be consideredat progression.   8. Bone scan on 12/22/2016 showed significantly worsening osseous metastatic disease. Left upper lobe nodule was slightly smaller in size. Zytiga was added in setting of worsening bone disease along with prednisonein aug 2018. Lupron administered 02/13/2017.  9. Scans after 3 months of zytiga showed progression of bone mets. PSA remains under control. Case discussed at tumor board and plan was to hold zytiga and proceed with [facility] for 6 months followed by repeat scans. xofigo started in jan 2019. Patient has  received 2 doses of xofigo so far     Interval history- Patient reports baseline fatigue  and sob that is chronic and no worse or better today. He also has chronic back pain which has not improved signficantly with xofigo so far  ECOG PS- 1 Pain scale- 4   Review of systems- Review of Systems  Constitutional: Positive for malaise/fatigue. Negative for chills, fever and weight loss.  HENT: Negative for congestion, ear discharge and nosebleeds.   Eyes: Negative for blurred vision.  Respiratory: Positive for shortness of breath. Negative for cough, hemoptysis, sputum production and wheezing.   Cardiovascular: Negative for chest pain, palpitations, orthopnea and claudication.  Gastrointestinal: Negative for abdominal pain, blood in stool, constipation, diarrhea, heartburn, melena, nausea and vomiting.  Genitourinary: Negative for dysuria, flank pain, frequency, hematuria and urgency.  Musculoskeletal: Positive for back pain. Negative for joint pain and myalgias.  Skin: Negative for rash.  Neurological: Negative for dizziness, tingling, focal weakness, seizures, weakness and headaches.  Endo/Heme/Allergies: Does not bruise/bleed easily.  Psychiatric/Behavioral: Negative for depression and suicidal ideas. The patient does not have insomnia.      No Known Allergies   Past Medical History:  Diagnosis Date  . Arthritis   . Cancer [facility])    prostate  . COPD (chronic obstructive pulmonary disease) ([place])   . Coronary artery disease   . Cough    chronic  . Diabetes mellitus without complication ([place])   . Edema   . Elevated PSA   . Headache   . Hypertension   . Orthopnea   . Oxygen deficit    o2 prn  . Prostate cancer ([place])   . Shortness of breath dyspnea   . Sleep apnea    cpap     Past Surgical History:  Procedure Laterality Date  . CARDIAC CATHETERIZATION  2014  . COLONOSCOPY    . CYSTOSCOPY W/ RETROGRADES Bilateral 10/18/2016   Procedure: CYSTOSCOPY WITH RETROGRADE PYELOGRAM;  Surgeon: Hollice Espy, MD;  Location: [facility] ORS;  Service: Urology;  Laterality:  Bilateral;  . EYE SURGERY    . KNEE ARTHROSCOPY    . TRANSURETHRAL RESECTION OF BLADDER TUMOR N/A 10/18/2016   Procedure: TRANSURETHRAL RESECTION OF BLADDER TUMOR (TURBT);  Surgeon: Hollice Espy, MD;  Location: [facility] ORS;  Service: Urology;  Laterality: N/A;  . TRANSURETHRAL RESECTION OF PROSTATE N/A 10/18/2016   Procedure: TRANSURETHRAL RESECTION OF THE PROSTATE (TURP) CHANNEL TURP;  Surgeon: Hollice Espy, MD;  Location: [facility] ORS;  Service: Urology;  Laterality: N/A;    Social History   Socioeconomic History  . Marital status: Single    Spouse name: Not on file  . Number of children: Not on file  . Years of education: Not on file  . Highest education level: Not on file  Social Needs  . Financial resource strain: Not on file  . Food insecurity - worry: Not on file  . Food insecurity - inability: Not on file  . Transportation needs - medical: Not on file  . Transportation needs - non-medical: Not on file  Occupational History  . Not on file  Tobacco Use  . Smoking status: Current Every Day Smoker    Packs/day: 0.50    Years: 55.00    Pack years: 27.50    Types: Cigarettes  . Smokeless tobacco: Never Used  Substance and Sexual Activity  . Alcohol use: No  . Drug use: No  . Sexual activity: Not on file  Other Topics Concern  . Not on file  Social History Narrative  . Not on file    Family History  Problem Relation Age of Onset  . CVA Mother   . CAD Father      Current Outpatient Medications:  .  abiraterone Acetate (ZYTIGA) 250 MG tablet, Take 4 tablets (1,000 mg total) by mouth daily. Take on an empty stomach 1 hour before or 2 hours after a meal, Disp: 120 tablet, Rfl: 3 .  aspirin EC 325 MG tablet, Take 325 mg by mouth every morning., Disp: , Rfl:  .  atorvastatin (LIPITOR) 40 MG tablet, Take 1 tablet (40 mg total) by mouth daily., Disp: 30 tablet, Rfl: 0 .  cloNIDine (CATAPRES) 0.2 MG tablet, Take 1 tablet (0.2 mg total) by mouth 2 (two) times daily., Disp: 60  tablet, Rfl: 2 .  ferrous sulfate 325 (65 FE) MG EC tablet, Take 1 tablet (325 mg total) by mouth 2 (two) times daily with a meal., Disp: 60 tablet, Rfl: 3 .  finasteride (PROSCAR) 5 MG tablet, Take 5 mg by mouth daily., Disp: , Rfl:  .  Fluticasone-Salmeterol (ADVAIR) 250-50 MCG/DOSE AEPB, Inhale 1 puff into the lungs 2 (two) times daily., Disp: , Rfl:  .  GLIPIZIDE XL 10 MG 24 hr tablet, Take 10 mg by mouth 2 (two) times daily. , Disp: , Rfl:  .  ketoconazole (NIZORAL) 2 % cream, Apply 1 application topically daily as needed. , Disp: , Rfl:  .  losartan (COZAAR) 25 MG tablet, Take 25 mg by mouth daily., Disp: , Rfl:  .  metFORMIN (GLUCOPHAGE) 500 MG tablet, Take 500 mg by mouth 2 (two) times daily with a meal., Disp: , Rfl:  .  metoprolol (LOPRESSOR) 50 MG tablet, Take 50 mg by mouth 2 (two) times daily., Disp: , Rfl:  .  oxyCODONE-acetaminophen (PERCOCET) 5-325 MG tablet, Take 1-2 tablets by mouth every 8 (eight) hours as needed., Disp: 30 tablet, Rfl: 0 .  oxyCODONE-acetaminophen (ROXICET) 5-325 MG tablet, Take 1 tablet by mouth every 4 (four) hours as needed for severe pain., Disp: 20 tablet, Rfl: 0 .  tamsulosin (FLOMAX) 0.4 MG CAPS capsule, Take 1 capsule (0.4 mg total) by mouth daily., Disp: 90 capsule, Rfl: 3 .  vitamin B-12 (CYANOCOBALAMIN) 1000 MCG tablet, Take 1 tablet (1,000 mcg total) by mouth daily., Disp: 30 tablet, Rfl: 3  Physical exam:  Vitals:   08/16/17 1420 08/16/17 1423  BP:  (!) 172/63  Pulse:  61  Resp:  18  Temp:  (!) 97.5 F (36.4 C)  TempSrc:  Tympanic  Weight: 240 lb (108.9 kg)    Physical Exam  Constitutional: He is oriented to person, place, and time and well-developed, well-nourished, and in no distress.  HENT:  Head: Normocephalic and atraumatic.  Eyes: EOM are normal. Pupils are equal, round, and reactive to light.  Neck: Normal range of motion.  Cardiovascular: Normal rate, regular rhythm and normal heart sounds.  Pulmonary/Chest: Effort normal and  breath sounds normal.  Pursed lip breathing  Abdominal: Soft. Bowel sounds are normal.  Neurological: He is alert and oriented to person, place, and time.  Skin: Skin is warm and dry.     CMP Latest Ref Rng & Units 08/15/2017  Glucose 65 - 99 mg/dL 101(H)  BUN 6 - 20 mg/dL 20  Creatinine 0.61 - 1.24 mg/dL 0.75  Sodium 135 - 145 mmol/L 140  Potassium 3.5 - 5.1 mmol/L 4.2  Chloride 101 - 111 mmol/L 108  CO2 22 - 32 mmol/L 24  Calcium 8.9 - 10.3 mg/dL 8.9  Total Protein 6.5 - 8.1 g/dL 7.1  Total Bilirubin 0.3 - 1.2 mg/dL 0.6  Alkaline Phos 38 - 126 U/L 323(H)  AST 15 - 41 U/L 31  ALT 17 - 63 U/L 20   CBC Latest Ref Rng & Units 08/15/2017  WBC 3.8 - 10.6 K/uL 4.3  Hemoglobin 13.0 - 18.0 g/dL 9.6(L)  Hematocrit 40.0 - 52.0 % 28.6(L)  Platelets 150 - 440 K/uL 128(L)    No images are attached to the encounter.  Nm Xofigo Injection  Result Date: 07/25/2017  Trudi Ida was injected intravenously in Nuclear Medicine under the supervision of the attending radiologist     Assessment and plan- Patient is a 72 y.o. male with castrate resistant prostate cancer with progression on lupron alone and zytiga currently on monthly xofigo  Patient will get his lupron today. He has 4 more monthly xofigo doses remaining following which we will repeat his scans. His PSA is not elavted but bone scans showed disease progression which shows that he has de dfferentiated prostate cancer and psa is a poor marker of hsi disease status.   Hb remains stable around 9-10. Likely due to chronic disease  I will see him in 3 months with cbc, cmp and psa. Scans after 6 doses of xofigo   Visit Diagnosis 1. Prostate cancer metastatic to bone ([place])   2. Anemia of chronic disease      Dr. Randa Evens, MD, MPH [facility] Pager- [phone] 08/17/2017 2:22 PM

## 2017-08-21 ENCOUNTER — Ambulatory Visit: Payer: Medicare Other | Admitting: Radiation Oncology

## 2017-08-22 ENCOUNTER — Encounter
Admission: RE | Admit: 2017-08-22 | Discharge: 2017-08-22 | Disposition: A | Discharge status: Home / Self Care | Payer: Medicare Other | Source: Ambulatory Visit | Attending: Radiation Oncology | Admitting: Radiation Oncology

## 2017-08-22 ENCOUNTER — Ambulatory Visit: Payer: Medicare Other | Admitting: Radiation Oncology

## 2017-08-29 ENCOUNTER — Encounter
Admission: RE | Admit: 2017-08-29 | Discharge: 2017-08-29 | Disposition: A | Discharge status: Home / Self Care | Payer: Medicare Other | Source: Ambulatory Visit | Attending: Radiation Oncology | Admitting: Radiation Oncology

## 2017-08-29 ENCOUNTER — Ambulatory Visit: Payer: Medicare Other | Admitting: Radiation Oncology

## 2017-08-29 DIAGNOSIS — C61 Malignant neoplasm of prostate: Secondary | ICD-10-CM | POA: Diagnosis not present

## 2017-08-29 DIAGNOSIS — C7951 Secondary malignant neoplasm of bone: Secondary | ICD-10-CM | POA: Insufficient documentation

## 2017-08-29 MED ORDER — RADIUM RA 223 DICHLORIDE 30 MCCI/ML IV SOLN
167.1000 | Freq: Once | INTRAVENOUS | Status: AC
  Administered 2017-08-29: 167.1 via INTRAVENOUS

## 2017-09-13 ENCOUNTER — Encounter: Payer: Self-pay | Admitting: *Deleted

## 2017-09-13 ENCOUNTER — Inpatient Hospital Stay: Payer: Medicare Other | Attending: Internal Medicine

## 2017-09-13 DIAGNOSIS — C61 Malignant neoplasm of prostate: Secondary | ICD-10-CM | POA: Diagnosis present

## 2017-09-13 LAB — CBC WITH DIFFERENTIAL/PLATELET
BASOS ABS: 0 10*3/uL (ref 0–0.1)
BASOS PCT: 1 %
Eosinophils Absolute: 0 10*3/uL (ref 0–0.7)
Eosinophils Relative: 1 %
HEMATOCRIT: 27.9 % — AB (ref 40.0–52.0)
Hemoglobin: 9.5 g/dL — ABNORMAL LOW (ref 13.0–18.0)
Lymphocytes Relative: 19 %
Lymphs Abs: 0.7 10*3/uL — ABNORMAL LOW (ref 1.0–3.6)
MCH: 33.2 pg (ref 26.0–34.0)
MCHC: 34 g/dL (ref 32.0–36.0)
MCV: 97.7 fL (ref 80.0–100.0)
MONO ABS: 0.7 10*3/uL (ref 0.2–1.0)
Monocytes Relative: 18 %
NEUTROS ABS: 2.3 10*3/uL (ref 1.4–6.5)
NEUTROS PCT: 61 %
Platelets: 103 10*3/uL — ABNORMAL LOW (ref 150–440)
RBC: 2.86 MIL/uL — ABNORMAL LOW (ref 4.40–5.90)
RDW: 15.9 % — AB (ref 11.5–14.5)
WBC: 3.7 10*3/uL — AB (ref 3.8–10.6)

## 2017-09-19 ENCOUNTER — Ambulatory Visit: Payer: Medicare Other | Admitting: Radiation Oncology

## 2017-09-19 ENCOUNTER — Ambulatory Visit: Payer: Medicare Other

## 2017-09-20 ENCOUNTER — Ambulatory Visit: Payer: Medicare Other | Admitting: Radiation Oncology

## 2017-09-26 ENCOUNTER — Ambulatory Visit: Payer: Medicare Other | Admitting: Radiation Oncology

## 2017-09-26 ENCOUNTER — Ambulatory Visit
Admission: RE | Admit: 2017-09-26 | Discharge: 2017-09-26 | Disposition: A | Discharge status: Home / Self Care | Payer: Medicare Other | Source: Ambulatory Visit | Attending: Radiation Oncology | Admitting: Radiation Oncology

## 2017-09-26 DIAGNOSIS — C61 Malignant neoplasm of prostate: Secondary | ICD-10-CM | POA: Insufficient documentation

## 2017-09-26 DIAGNOSIS — Z9889 Other specified postprocedural states: Secondary | ICD-10-CM | POA: Diagnosis not present

## 2017-09-26 DIAGNOSIS — C7951 Secondary malignant neoplasm of bone: Secondary | ICD-10-CM | POA: Diagnosis not present

## 2017-09-26 DIAGNOSIS — L988 Other specified disorders of the skin and subcutaneous tissue: Secondary | ICD-10-CM | POA: Diagnosis not present

## 2017-09-26 MED ORDER — RADIUM RA 223 DICHLORIDE 30 MCCI/ML IV SOLN
161.5000 | Freq: Once | INTRAVENOUS | Status: AC
  Administered 2017-09-26: 161.5 via INTRAVENOUS

## 2017-09-26 NOTE — Progress Notes (Signed)
Radiation Oncology Follow up Note  Name: Michael Mathews   Date:   09/26/2017 MRN:  [id number] DOB: 1946-04-13    This 72 y.o. male presents to the clinic today for fourth is Xofigoinfusion  REFERRING PROVIDER: Noreene Filbert, MD  HPI: Michael Mathews is a 72 year old male with known castrate resistant prostate cancer seen today for his fourth Xofigo infusion. He is seen today and doing well He is pain is under excellent control. He has some new lesions on his back which Dr. dermatology also he has asked me to review for possible treatment and have set that up for next week..  COMPLICATIONS OF TREATMENT: none  FOLLOW UP COMPLIANCE: keeps appointments   PHYSICAL EXAM:  There were no vitals taken for this visit. Well-developed well-nourished patient in NAD. HEENT reveals PERLA, EOMI, discs not visualized.  Oral cavity is clear. No oral mucosal lesions are identified. Neck is clear without evidence of cervical or supraclavicular adenopathy. Lungs are clear to A&P. Cardiac examination is essentially unremarkable with regular rate and rhythm without murmur rub or thrill. Abdomen is benign with no organomegaly or masses noted. Motor sensory and DTR levels are equal and symmetric in the upper and lower extremities. Cranial nerves II through XII are grossly intact. Proprioception is intact. No peripheral adenopathy or edema is identified. No motor or sensory levels are noted. Crude visual fields are within normal range.  RADIOLOGY RESULTS: no current films for review  PLAN: Peripheral line was started on the patient and 20 cc of normal saline were passed to make sure there was patency of the lines. Trudi Ida was administered over a 5 minute infusion push by nuclear medicine technologist supervised by radiation oncologist. After completion of IV push of Xofigo 30 cc an additional saline were passed through the peripheral line. Oral lines syringes drapes and original container of Xofigo were then taken to nuclear  medicine for storage. Patient tolerated the procedure well without side effect or complaint. Patient has anti-emetic medication. Have scheduled the patient for a three-week followup to check on his counts. Patient is to call sooner with any side effects or complaints.    Noreene Filbert, MD

## 2017-10-04 ENCOUNTER — Ambulatory Visit
Admission: RE | Admit: 2017-10-04 | Discharge: 2017-10-04 | Disposition: A | Discharge status: Home / Self Care | Payer: Medicare Other | Source: Ambulatory Visit | Attending: Radiation Oncology | Admitting: Radiation Oncology

## 2017-10-04 ENCOUNTER — Other Ambulatory Visit: Payer: Self-pay

## 2017-10-04 VITALS — BP 146/75 | Temp 98.0°F | Resp 16 | Ht 70.0 in | Wt 232.6 lb

## 2017-10-04 DIAGNOSIS — C61 Malignant neoplasm of prostate: Secondary | ICD-10-CM | POA: Insufficient documentation

## 2017-10-04 DIAGNOSIS — C7B8 Other secondary neuroendocrine tumors: Secondary | ICD-10-CM | POA: Insufficient documentation

## 2017-10-04 DIAGNOSIS — Z923 Personal history of irradiation: Secondary | ICD-10-CM | POA: Insufficient documentation

## 2017-10-04 DIAGNOSIS — C7A8 Other malignant neuroendocrine tumors: Secondary | ICD-10-CM | POA: Diagnosis not present

## 2017-10-04 DIAGNOSIS — C4492 Squamous cell carcinoma of skin, unspecified: Secondary | ICD-10-CM

## 2017-10-04 DIAGNOSIS — C7951 Secondary malignant neoplasm of bone: Secondary | ICD-10-CM | POA: Diagnosis not present

## 2017-10-04 NOTE — Progress Notes (Signed)
Radiation Oncology Follow up Note old patient new area neuroendocrine skin lesion  Name: Michael Mathews   Date:   10/04/2017 MRN:  [id number] DOB: 1945-07-04    This 72 y.o. 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  REFERRING PROVIDER: [name], MD  HPI: patient is a 72 year old male well-known to department currently receiving a series of Xofigoinjections for stage IV castrate resistant prostate cancer with widespread bony disease. He has a large raised lesion on his mid back which has been biopsied and shows neuroendocrine carcinoma with metastatic carcinoma such as pulmonary or thyroid origin not excluded. Patient did back in January have a CT scan of his chest showing no evidence of pulmonary diseasealthough does have widespread osseous metastatic disease. This lesion is ulcerated and oozing I been asked to evaluate him for possible radiation therapy..  COMPLICATIONS OF TREATMENT: none  FOLLOW UP COMPLIANCE: keeps appointments   PHYSICAL EXAM:  BP (!) 146/75 (BP Location: Left Arm, Patient Position: Sitting, Cuff Size: Large)   Temp 98 F (36.7 C) (Tympanic)   Resp 16   Ht 5\' 10"  (1.778 m)   Wt 232 lb 9.4 oz (105.5 kg)   BMI 33.37 kg/m  Patient has approximately 3 cm raised ulcerative mass in his mid back with evidence of erythematous involvement surrounding the lesion. No evidence of supraclavicular or axillary adenopathy is identified. Well-developed well-nourished patient in NAD. HEENT reveals PERLA, EOMI, discs not visualized.  Oral cavity is clear. No oral mucosal lesions are identified. Neck is clear without evidence of cervical or supraclavicular adenopathy. Lungs are clear to A&P. Cardiac examination is essentially unremarkable with regular rate and rhythm without murmur rub or thrill. Abdomen is benign with no organomegaly or masses noted.  Motor sensory and DTR levels are equal and symmetric in the upper and lower extremities. Cranial nerves II through XII are grossly intact. Proprioception is intact. No peripheral adenopathy or edema is identified. No motor or sensory levels are noted. Crude visual fields are within normal range.  RADIOLOGY RESULTS: cT scan from January reviewed and compatible with the above-stated findings  PLAN: at this time based on the patient's stage IV prostate cancer would elect to treat with radiation therapy. I would treat a large field with electrons up to 5000 cGy in then boost another 1600 cGy to area of residual disease. Risks and benefits of treatment including skin reaction fatigue all were discussed with the patient. I personally set up and ordered CT simulation for early next week. Patient comprehend my treatment plan well.  I would like to take this opportunity to thank you for allowing me to participate in the care of your patient.Noreene Filbert, MD

## 2017-10-08 ENCOUNTER — Ambulatory Visit
Admission: RE | Admit: 2017-10-08 | Discharge: 2017-10-08 | Disposition: A | Discharge status: Home / Self Care | Payer: Medicare Other | Source: Ambulatory Visit | Attending: Radiation Oncology | Admitting: Radiation Oncology

## 2017-10-08 DIAGNOSIS — C61 Malignant neoplasm of prostate: Secondary | ICD-10-CM | POA: Diagnosis not present

## 2017-10-08 DIAGNOSIS — Z510 Encounter for antineoplastic radiation therapy: Secondary | ICD-10-CM | POA: Diagnosis not present

## 2017-10-11 ENCOUNTER — Inpatient Hospital Stay: Payer: Medicare Other

## 2017-10-14 ENCOUNTER — Inpatient Hospital Stay: Admission: RE | Admit: 2017-10-14 | Payer: Self-pay | Source: Ambulatory Visit

## 2017-10-15 ENCOUNTER — Ambulatory Visit
Admission: RE | Admit: 2017-10-15 | Discharge: 2017-10-15 | Disposition: A | Discharge status: Home / Self Care | Payer: Medicare Other | Source: Ambulatory Visit | Attending: Radiation Oncology | Admitting: Radiation Oncology

## 2017-10-15 DIAGNOSIS — Z510 Encounter for antineoplastic radiation therapy: Secondary | ICD-10-CM | POA: Diagnosis not present

## 2017-10-15 DIAGNOSIS — C7951 Secondary malignant neoplasm of bone: Secondary | ICD-10-CM | POA: Insufficient documentation

## 2017-10-15 DIAGNOSIS — C7A8 Other malignant neuroendocrine tumors: Secondary | ICD-10-CM | POA: Insufficient documentation

## 2017-10-15 DIAGNOSIS — C61 Malignant neoplasm of prostate: Secondary | ICD-10-CM | POA: Insufficient documentation

## 2017-10-16 ENCOUNTER — Ambulatory Visit
Admission: RE | Admit: 2017-10-16 | Discharge: 2017-10-16 | Disposition: A | Discharge status: Home / Self Care | Payer: Medicare Other | Source: Ambulatory Visit | Attending: Radiation Oncology | Admitting: Radiation Oncology

## 2017-10-16 DIAGNOSIS — Z510 Encounter for antineoplastic radiation therapy: Secondary | ICD-10-CM | POA: Diagnosis not present

## 2017-10-17 ENCOUNTER — Ambulatory Visit: Payer: Medicare Other

## 2017-10-17 ENCOUNTER — Ambulatory Visit: Payer: Medicare Other | Admitting: Radiation Oncology

## 2017-10-17 ENCOUNTER — Ambulatory Visit
Admission: RE | Admit: 2017-10-17 | Discharge: 2017-10-17 | Disposition: A | Discharge status: Home / Self Care | Payer: Medicare Other | Source: Ambulatory Visit | Attending: Radiation Oncology | Admitting: Radiation Oncology

## 2017-10-17 DIAGNOSIS — Z510 Encounter for antineoplastic radiation therapy: Secondary | ICD-10-CM | POA: Diagnosis not present

## 2017-10-18 ENCOUNTER — Inpatient Hospital Stay: Payer: Medicare Other | Attending: Internal Medicine

## 2017-10-18 ENCOUNTER — Ambulatory Visit
Admission: RE | Admit: 2017-10-18 | Discharge: 2017-10-18 | Disposition: A | Discharge status: Home / Self Care | Payer: Medicare Other | Source: Ambulatory Visit | Attending: Radiation Oncology | Admitting: Radiation Oncology

## 2017-10-18 ENCOUNTER — Other Ambulatory Visit: Payer: Self-pay

## 2017-10-18 ENCOUNTER — Encounter: Payer: Self-pay | Admitting: *Deleted

## 2017-10-18 DIAGNOSIS — F17210 Nicotine dependence, cigarettes, uncomplicated: Secondary | ICD-10-CM | POA: Insufficient documentation

## 2017-10-18 DIAGNOSIS — C61 Malignant neoplasm of prostate: Secondary | ICD-10-CM | POA: Diagnosis present

## 2017-10-18 DIAGNOSIS — Z79818 Long term (current) use of other agents affecting estrogen receptors and estrogen levels: Secondary | ICD-10-CM | POA: Insufficient documentation

## 2017-10-18 DIAGNOSIS — D649 Anemia, unspecified: Secondary | ICD-10-CM | POA: Diagnosis not present

## 2017-10-18 DIAGNOSIS — Z510 Encounter for antineoplastic radiation therapy: Secondary | ICD-10-CM | POA: Diagnosis not present

## 2017-10-18 DIAGNOSIS — C7951 Secondary malignant neoplasm of bone: Secondary | ICD-10-CM | POA: Diagnosis not present

## 2017-10-18 LAB — CBC WITH DIFFERENTIAL/PLATELET
Basophils Absolute: 0 10*3/uL (ref 0–0.1)
Basophils Relative: 1 %
EOS PCT: 1 %
Eosinophils Absolute: 0 10*3/uL (ref 0–0.7)
HCT: 24.4 % — ABNORMAL LOW (ref 40.0–52.0)
Hemoglobin: 8.3 g/dL — ABNORMAL LOW (ref 13.0–18.0)
LYMPHS ABS: 0.6 10*3/uL — AB (ref 1.0–3.6)
LYMPHS PCT: 13 %
MCH: 32.9 pg (ref 26.0–34.0)
MCHC: 33.9 g/dL (ref 32.0–36.0)
MCV: 96.8 fL (ref 80.0–100.0)
Monocytes Absolute: 0.6 10*3/uL (ref 0.2–1.0)
Monocytes Relative: 13 %
Neutro Abs: 3.6 10*3/uL (ref 1.4–6.5)
Neutrophils Relative %: 72 %
PLATELETS: 92 10*3/uL — AB (ref 150–440)
RBC: 2.52 MIL/uL — AB (ref 4.40–5.90)
RDW: 16 % — ABNORMAL HIGH (ref 11.5–14.5)
WBC: 4.9 10*3/uL (ref 3.8–10.6)

## 2017-10-19 ENCOUNTER — Ambulatory Visit
Admission: RE | Admit: 2017-10-19 | Discharge: 2017-10-19 | Disposition: A | Discharge status: Home / Self Care | Payer: Medicare Other | Source: Ambulatory Visit | Attending: Radiation Oncology | Admitting: Radiation Oncology

## 2017-10-19 DIAGNOSIS — Z510 Encounter for antineoplastic radiation therapy: Secondary | ICD-10-CM | POA: Diagnosis not present

## 2017-10-22 ENCOUNTER — Ambulatory Visit
Admission: RE | Admit: 2017-10-22 | Discharge: 2017-10-22 | Disposition: A | Discharge status: Home / Self Care | Payer: Medicare Other | Source: Ambulatory Visit | Attending: Radiation Oncology | Admitting: Radiation Oncology

## 2017-10-22 DIAGNOSIS — Z510 Encounter for antineoplastic radiation therapy: Secondary | ICD-10-CM | POA: Diagnosis not present

## 2017-10-23 ENCOUNTER — Ambulatory Visit
Admission: RE | Admit: 2017-10-23 | Discharge: 2017-10-23 | Disposition: A | Discharge status: Home / Self Care | Payer: Medicare Other | Source: Ambulatory Visit | Attending: Radiation Oncology | Admitting: Radiation Oncology

## 2017-10-23 DIAGNOSIS — Z510 Encounter for antineoplastic radiation therapy: Secondary | ICD-10-CM | POA: Diagnosis not present

## 2017-10-24 ENCOUNTER — Ambulatory Visit: Payer: Medicare Other | Admitting: Radiation Oncology

## 2017-10-24 ENCOUNTER — Encounter
Admission: RE | Admit: 2017-10-24 | Discharge: 2017-10-24 | Disposition: A | Discharge status: Home / Self Care | Payer: Medicare Other | Source: Ambulatory Visit | Attending: Radiation Oncology | Admitting: Radiation Oncology

## 2017-10-24 ENCOUNTER — Ambulatory Visit
Admission: RE | Admit: 2017-10-24 | Discharge: 2017-10-24 | Disposition: A | Discharge status: Home / Self Care | Payer: Medicare Other | Source: Ambulatory Visit | Attending: Radiation Oncology | Admitting: Radiation Oncology

## 2017-10-24 DIAGNOSIS — C61 Malignant neoplasm of prostate: Secondary | ICD-10-CM | POA: Diagnosis present

## 2017-10-24 DIAGNOSIS — C7951 Secondary malignant neoplasm of bone: Secondary | ICD-10-CM | POA: Insufficient documentation

## 2017-10-24 DIAGNOSIS — Z510 Encounter for antineoplastic radiation therapy: Secondary | ICD-10-CM | POA: Diagnosis not present

## 2017-10-24 MED ORDER — RADIUM RA 223 DICHLORIDE 30 MCCI/ML IV SOLN
166.0530 | Freq: Once | INTRAVENOUS | Status: AC
  Administered 2017-10-24: 166.053 via INTRAVENOUS

## 2017-10-24 NOTE — Progress Notes (Signed)
Radiation Oncology Follow up Note  Name: Michael Mathews   Date:   10/24/2017 MRN:  [id number] DOB: 09/11/1945    This 72 y.o. male presents to the clinic today for fifth Xofigotreatment.  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 He is also here for his fifth at of 6 Xofigoinjections. He is doing well having no bone pain no side effects or complaints from prior treatments..  COMPLICATIONS OF TREATMENT: none  FOLLOW UP COMPLIANCE: keeps appointments   PHYSICAL EXAM:  There were no vitals taken for this visit. Well-developed well-nourished patient in NAD. HEENT reveals PERLA, EOMI, discs not visualized.  Oral cavity is clear. No oral mucosal lesions are identified. Neck is clear without evidence of cervical or supraclavicular adenopathy. Lungs are clear to A&P. Cardiac examination is essentially unremarkable with regular rate and rhythm without murmur rub or thrill. Abdomen is benign with no organomegaly or masses noted. Motor sensory and DTR levels are equal and symmetric in the upper and lower extremities. Cranial nerves II through XII are grossly intact. Proprioception is intact. No peripheral adenopathy or edema is identified. No motor or sensory levels are noted. Crude visual fields are within normal range.  RADIOLOGY RESULTS: no current films for review  PLAN: Peripheral line was started on the patient and 20 cc of normal saline were passed to make sure there was patency of the lines. Trudi Ida was administered over a 5 minute infusion push by nuclear medicine technologist supervised by radiation oncologist. After completion of IV push of Xofigo 30 cc an additional saline were passed through the peripheral line. Oral lines syringes drapes and original container of Xofigo were then taken to nuclear medicine for storage. Patient tolerated the procedure well  without side effect or complaint. Patient has anti-emetic medication. Have scheduled the patient for a three-week followup to check on his counts. Patient is to call sooner with any side effects or complaints.    Noreene Filbert, MD

## 2017-10-25 ENCOUNTER — Ambulatory Visit
Admission: RE | Admit: 2017-10-25 | Discharge: 2017-10-25 | Disposition: A | Discharge status: Home / Self Care | Payer: Medicare Other | Source: Ambulatory Visit | Attending: Radiation Oncology | Admitting: Radiation Oncology

## 2017-10-25 DIAGNOSIS — Z510 Encounter for antineoplastic radiation therapy: Secondary | ICD-10-CM | POA: Diagnosis not present

## 2017-10-26 ENCOUNTER — Ambulatory Visit
Admission: RE | Admit: 2017-10-26 | Discharge: 2017-10-26 | Disposition: A | Discharge status: Home / Self Care | Payer: Medicare Other | Source: Ambulatory Visit | Attending: Radiation Oncology | Admitting: Radiation Oncology

## 2017-10-26 DIAGNOSIS — Z510 Encounter for antineoplastic radiation therapy: Secondary | ICD-10-CM | POA: Diagnosis not present

## 2017-10-29 ENCOUNTER — Ambulatory Visit
Admission: RE | Admit: 2017-10-29 | Discharge: 2017-10-29 | Disposition: A | Discharge status: Home / Self Care | Payer: Medicare Other | Source: Ambulatory Visit | Attending: Radiation Oncology | Admitting: Radiation Oncology

## 2017-10-29 DIAGNOSIS — Z510 Encounter for antineoplastic radiation therapy: Secondary | ICD-10-CM | POA: Diagnosis not present

## 2017-10-30 ENCOUNTER — Ambulatory Visit
Admission: RE | Admit: 2017-10-30 | Discharge: 2017-10-30 | Disposition: A | Discharge status: Home / Self Care | Payer: Medicare Other | Source: Ambulatory Visit | Attending: Radiation Oncology | Admitting: Radiation Oncology

## 2017-10-30 DIAGNOSIS — Z510 Encounter for antineoplastic radiation therapy: Secondary | ICD-10-CM | POA: Diagnosis not present

## 2017-10-31 ENCOUNTER — Ambulatory Visit
Admission: RE | Admit: 2017-10-31 | Discharge: 2017-10-31 | Disposition: A | Discharge status: Home / Self Care | Payer: Medicare Other | Source: Ambulatory Visit | Attending: Radiation Oncology | Admitting: Radiation Oncology

## 2017-10-31 DIAGNOSIS — Z510 Encounter for antineoplastic radiation therapy: Secondary | ICD-10-CM | POA: Diagnosis not present

## 2017-11-01 ENCOUNTER — Ambulatory Visit
Admission: RE | Admit: 2017-11-01 | Discharge: 2017-11-01 | Disposition: A | Discharge status: Home / Self Care | Payer: Medicare Other | Source: Ambulatory Visit | Attending: Radiation Oncology | Admitting: Radiation Oncology

## 2017-11-01 DIAGNOSIS — Z510 Encounter for antineoplastic radiation therapy: Secondary | ICD-10-CM | POA: Diagnosis not present

## 2017-11-02 ENCOUNTER — Ambulatory Visit
Admission: RE | Admit: 2017-11-02 | Discharge: 2017-11-02 | Disposition: A | Discharge status: Home / Self Care | Payer: Medicare Other | Source: Ambulatory Visit | Attending: Radiation Oncology | Admitting: Radiation Oncology

## 2017-11-02 DIAGNOSIS — Z510 Encounter for antineoplastic radiation therapy: Secondary | ICD-10-CM | POA: Diagnosis not present

## 2017-11-06 ENCOUNTER — Ambulatory Visit
Admission: RE | Admit: 2017-11-06 | Discharge: 2017-11-06 | Disposition: A | Discharge status: Home / Self Care | Payer: Medicare Other | Source: Ambulatory Visit | Attending: Radiation Oncology | Admitting: Radiation Oncology

## 2017-11-06 DIAGNOSIS — Z510 Encounter for antineoplastic radiation therapy: Secondary | ICD-10-CM | POA: Diagnosis not present

## 2017-11-07 ENCOUNTER — Ambulatory Visit
Admission: RE | Admit: 2017-11-07 | Discharge: 2017-11-07 | Disposition: A | Discharge status: Home / Self Care | Payer: Medicare Other | Source: Ambulatory Visit | Attending: Radiation Oncology | Admitting: Radiation Oncology

## 2017-11-07 DIAGNOSIS — Z510 Encounter for antineoplastic radiation therapy: Secondary | ICD-10-CM | POA: Diagnosis not present

## 2017-11-08 ENCOUNTER — Ambulatory Visit
Admission: RE | Admit: 2017-11-08 | Discharge: 2017-11-08 | Disposition: A | Discharge status: Home / Self Care | Payer: Medicare Other | Source: Ambulatory Visit | Attending: Radiation Oncology | Admitting: Radiation Oncology

## 2017-11-08 ENCOUNTER — Inpatient Hospital Stay: Payer: Medicare Other

## 2017-11-08 DIAGNOSIS — Z510 Encounter for antineoplastic radiation therapy: Secondary | ICD-10-CM | POA: Diagnosis not present

## 2017-11-09 ENCOUNTER — Ambulatory Visit
Admission: RE | Admit: 2017-11-09 | Discharge: 2017-11-09 | Disposition: A | Discharge status: Home / Self Care | Payer: Medicare Other | Source: Ambulatory Visit | Attending: Radiation Oncology | Admitting: Radiation Oncology

## 2017-11-09 DIAGNOSIS — Z510 Encounter for antineoplastic radiation therapy: Secondary | ICD-10-CM | POA: Diagnosis not present

## 2017-11-12 ENCOUNTER — Ambulatory Visit
Admission: RE | Admit: 2017-11-12 | Discharge: 2017-11-12 | Disposition: A | Discharge status: Home / Self Care | Payer: Medicare Other | Source: Ambulatory Visit | Attending: Radiation Oncology | Admitting: Radiation Oncology

## 2017-11-12 DIAGNOSIS — C61 Malignant neoplasm of prostate: Secondary | ICD-10-CM | POA: Insufficient documentation

## 2017-11-12 DIAGNOSIS — C7951 Secondary malignant neoplasm of bone: Secondary | ICD-10-CM | POA: Diagnosis not present

## 2017-11-12 DIAGNOSIS — Z510 Encounter for antineoplastic radiation therapy: Secondary | ICD-10-CM | POA: Insufficient documentation

## 2017-11-12 DIAGNOSIS — C7A8 Other malignant neuroendocrine tumors: Secondary | ICD-10-CM | POA: Diagnosis not present

## 2017-11-13 ENCOUNTER — Ambulatory Visit
Admission: RE | Admit: 2017-11-13 | Discharge: 2017-11-13 | Disposition: A | Discharge status: Home / Self Care | Payer: Medicare Other | Source: Ambulatory Visit | Attending: Radiation Oncology | Admitting: Radiation Oncology

## 2017-11-13 DIAGNOSIS — Z510 Encounter for antineoplastic radiation therapy: Secondary | ICD-10-CM | POA: Diagnosis not present

## 2017-11-14 ENCOUNTER — Ambulatory Visit: Payer: Medicare Other | Admitting: Radiation Oncology

## 2017-11-14 ENCOUNTER — Ambulatory Visit
Admission: RE | Admit: 2017-11-14 | Discharge: 2017-11-14 | Disposition: A | Discharge status: Home / Self Care | Payer: Medicare Other | Source: Ambulatory Visit | Attending: Radiation Oncology | Admitting: Radiation Oncology

## 2017-11-14 ENCOUNTER — Ambulatory Visit: Payer: Medicare Other

## 2017-11-14 DIAGNOSIS — Z510 Encounter for antineoplastic radiation therapy: Secondary | ICD-10-CM | POA: Diagnosis not present

## 2017-11-15 ENCOUNTER — Inpatient Hospital Stay: Payer: Medicare Other | Attending: Internal Medicine

## 2017-11-15 ENCOUNTER — Ambulatory Visit
Admission: RE | Admit: 2017-11-15 | Discharge: 2017-11-15 | Disposition: A | Discharge status: Home / Self Care | Payer: Medicare Other | Source: Ambulatory Visit | Attending: Radiation Oncology | Admitting: Radiation Oncology

## 2017-11-15 ENCOUNTER — Other Ambulatory Visit: Payer: Self-pay | Admitting: *Deleted

## 2017-11-15 DIAGNOSIS — C7A8 Other malignant neuroendocrine tumors: Secondary | ICD-10-CM | POA: Insufficient documentation

## 2017-11-15 DIAGNOSIS — Z510 Encounter for antineoplastic radiation therapy: Secondary | ICD-10-CM | POA: Diagnosis not present

## 2017-11-15 DIAGNOSIS — F17210 Nicotine dependence, cigarettes, uncomplicated: Secondary | ICD-10-CM | POA: Insufficient documentation

## 2017-11-15 DIAGNOSIS — C61 Malignant neoplasm of prostate: Secondary | ICD-10-CM | POA: Insufficient documentation

## 2017-11-15 DIAGNOSIS — D696 Thrombocytopenia, unspecified: Secondary | ICD-10-CM | POA: Diagnosis not present

## 2017-11-15 DIAGNOSIS — C7951 Secondary malignant neoplasm of bone: Secondary | ICD-10-CM | POA: Insufficient documentation

## 2017-11-15 DIAGNOSIS — D649 Anemia, unspecified: Secondary | ICD-10-CM

## 2017-11-15 DIAGNOSIS — Z79818 Long term (current) use of other agents affecting estrogen receptors and estrogen levels: Secondary | ICD-10-CM | POA: Insufficient documentation

## 2017-11-15 LAB — CBC WITH DIFFERENTIAL/PLATELET
BASOS PCT: 1 %
Basophils Absolute: 0 10*3/uL (ref 0–0.1)
EOS ABS: 0 10*3/uL (ref 0–0.7)
Eosinophils Relative: 1 %
HEMATOCRIT: 19.4 % — AB (ref 40.0–52.0)
HEMOGLOBIN: 6.6 g/dL — AB (ref 13.0–18.0)
LYMPHS ABS: 0.4 10*3/uL — AB (ref 1.0–3.6)
Lymphocytes Relative: 9 %
MCH: 33.5 pg (ref 26.0–34.0)
MCHC: 34.3 g/dL (ref 32.0–36.0)
MCV: 97.8 fL (ref 80.0–100.0)
MONO ABS: 0.6 10*3/uL (ref 0.2–1.0)
MONOS PCT: 14 %
NEUTROS PCT: 75 %
Neutro Abs: 3.5 10*3/uL (ref 1.4–6.5)
Platelets: 89 10*3/uL — ABNORMAL LOW (ref 150–440)
RBC: 1.98 MIL/uL — ABNORMAL LOW (ref 4.40–5.90)
RDW: 16.6 % — AB (ref 11.5–14.5)
WBC: 4.6 10*3/uL (ref 3.8–10.6)

## 2017-11-15 LAB — COMPREHENSIVE METABOLIC PANEL
ALBUMIN: 3 g/dL — AB (ref 3.5–5.0)
ALK PHOS: 168 U/L — AB (ref 38–126)
ALT: 15 U/L — AB (ref 17–63)
AST: 36 U/L (ref 15–41)
Anion gap: 9 (ref 5–15)
BUN: 17 mg/dL (ref 6–20)
CALCIUM: 8.3 mg/dL — AB (ref 8.9–10.3)
CHLORIDE: 104 mmol/L (ref 101–111)
CO2: 23 mmol/L (ref 22–32)
CREATININE: 0.89 mg/dL (ref 0.61–1.24)
GFR calc Af Amer: >60 mL/min (ref >60)
GFR calc non Af Amer: >60 mL/min (ref >60)
Glucose, Bld: 82 mg/dL (ref 65–99)
Potassium: 3.6 mmol/L (ref 3.5–5.1)
SODIUM: 136 mmol/L (ref 135–145)
Total Bilirubin: 0.7 mg/dL (ref 0.3–1.2)
Total Protein: 6.6 g/dL (ref 6.5–8.1)

## 2017-11-15 LAB — PSA: PROSTATIC SPECIFIC ANTIGEN: 0.69 ng/mL (ref 0.00–4.00)

## 2017-11-16 ENCOUNTER — Other Ambulatory Visit: Payer: Self-pay | Admitting: *Deleted

## 2017-11-16 ENCOUNTER — Other Ambulatory Visit: Payer: Self-pay

## 2017-11-16 ENCOUNTER — Other Ambulatory Visit: Payer: Self-pay | Admitting: Oncology

## 2017-11-16 ENCOUNTER — Encounter: Payer: Self-pay | Admitting: *Deleted

## 2017-11-16 ENCOUNTER — Ambulatory Visit
Admission: RE | Admit: 2017-11-16 | Discharge: 2017-11-16 | Disposition: A | Discharge status: Home / Self Care | Payer: Medicare Other | Source: Ambulatory Visit | Attending: Radiation Oncology | Admitting: Radiation Oncology

## 2017-11-16 ENCOUNTER — Inpatient Hospital Stay: Payer: Medicare Other

## 2017-11-16 DIAGNOSIS — D649 Anemia, unspecified: Secondary | ICD-10-CM

## 2017-11-16 DIAGNOSIS — Z510 Encounter for antineoplastic radiation therapy: Secondary | ICD-10-CM | POA: Diagnosis not present

## 2017-11-16 DIAGNOSIS — C61 Malignant neoplasm of prostate: Secondary | ICD-10-CM | POA: Diagnosis not present

## 2017-11-16 LAB — PREPARE RBC (CROSSMATCH)

## 2017-11-16 MED ORDER — ACETAMINOPHEN 325 MG PO TABS
650.0000 mg | ORAL_TABLET | Freq: Once | ORAL | Status: AC
  Administered 2017-11-16: 650 mg via ORAL
  Filled 2017-11-16: qty 2

## 2017-11-16 MED ORDER — SODIUM CHLORIDE 0.9% FLUSH
3.0000 mL | INTRAVENOUS | Status: DC | PRN
  Filled 2017-11-16: qty 3

## 2017-11-16 MED ORDER — SODIUM CHLORIDE 0.9 % IV SOLN
250.0000 mL | Freq: Once | INTRAVENOUS | Status: AC
  Administered 2017-11-16: 250 mL via INTRAVENOUS
  Filled 2017-11-16: qty 250

## 2017-11-16 MED ORDER — HEPARIN SOD (PORK) LOCK FLUSH 100 UNIT/ML IV SOLN: 250.0000 [IU] | INTRAVENOUS | Status: DC | PRN

## 2017-11-17 LAB — BPAM RBC
BLOOD PRODUCT EXPIRATION DATE: 201906142359
BLOOD PRODUCT EXPIRATION DATE: 201906182359
ISSUE DATE / TIME: 201906071214
ISSUE DATE / TIME: 201906071013
UNIT TYPE AND RH: 6200
Unit Type and Rh: 6200

## 2017-11-17 LAB — TYPE AND SCREEN
ABO/RH(D): A POS
ANTIBODY SCREEN: NEGATIVE
Unit division: 0
Unit division: 0

## 2017-11-19 ENCOUNTER — Ambulatory Visit
Admission: RE | Admit: 2017-11-19 | Discharge: 2017-11-19 | Disposition: A | Discharge status: Home / Self Care | Payer: Medicare Other | Source: Ambulatory Visit | Attending: Radiation Oncology | Admitting: Radiation Oncology

## 2017-11-19 ENCOUNTER — Other Ambulatory Visit: Payer: Self-pay

## 2017-11-19 DIAGNOSIS — C7951 Secondary malignant neoplasm of bone: Secondary | ICD-10-CM

## 2017-11-19 DIAGNOSIS — Z510 Encounter for antineoplastic radiation therapy: Secondary | ICD-10-CM | POA: Diagnosis not present

## 2017-11-20 ENCOUNTER — Inpatient Hospital Stay: Payer: Medicare Other

## 2017-11-20 ENCOUNTER — Encounter: Payer: Self-pay | Admitting: Oncology

## 2017-11-20 ENCOUNTER — Inpatient Hospital Stay (HOSPITAL_BASED_OUTPATIENT_CLINIC_OR_DEPARTMENT_OTHER): Payer: Medicare Other | Admitting: Oncology

## 2017-11-20 VITALS — BP 156/69 | HR 63 | Temp 98.3°F | Resp 18 | Ht 70.0 in | Wt 232.7 lb

## 2017-11-20 DIAGNOSIS — D696 Thrombocytopenia, unspecified: Secondary | ICD-10-CM | POA: Diagnosis not present

## 2017-11-20 DIAGNOSIS — D649 Anemia, unspecified: Secondary | ICD-10-CM | POA: Diagnosis not present

## 2017-11-20 DIAGNOSIS — F17210 Nicotine dependence, cigarettes, uncomplicated: Secondary | ICD-10-CM

## 2017-11-20 DIAGNOSIS — C61 Malignant neoplasm of prostate: Secondary | ICD-10-CM

## 2017-11-20 DIAGNOSIS — C7A8 Other malignant neuroendocrine tumors: Secondary | ICD-10-CM | POA: Diagnosis not present

## 2017-11-20 DIAGNOSIS — D6959 Other secondary thrombocytopenia: Secondary | ICD-10-CM

## 2017-11-20 DIAGNOSIS — Z79818 Long term (current) use of other agents affecting estrogen receptors and estrogen levels: Secondary | ICD-10-CM

## 2017-11-20 DIAGNOSIS — T50905A Adverse effect of unspecified drugs, medicaments and biological substances, initial encounter: Secondary | ICD-10-CM

## 2017-11-20 DIAGNOSIS — C7951 Secondary malignant neoplasm of bone: Secondary | ICD-10-CM

## 2017-11-20 DIAGNOSIS — D6489 Other specified anemias: Secondary | ICD-10-CM

## 2017-11-20 LAB — COMPREHENSIVE METABOLIC PANEL
ALBUMIN: 3 g/dL — AB (ref 3.5–5.0)
ALT: 18 U/L (ref 17–63)
AST: 36 U/L (ref 15–41)
Alkaline Phosphatase: 183 U/L — ABNORMAL HIGH (ref 38–126)
Anion gap: 10 (ref 5–15)
BUN: 19 mg/dL (ref 6–20)
CHLORIDE: 105 mmol/L (ref 101–111)
CO2: 27 mmol/L (ref 22–32)
Calcium: 8.5 mg/dL — ABNORMAL LOW (ref 8.9–10.3)
Creatinine, Ser: 0.97 mg/dL (ref 0.61–1.24)
GFR calc Af Amer: >60 mL/min (ref >60)
GFR calc non Af Amer: >60 mL/min (ref >60)
GLUCOSE: 96 mg/dL (ref 65–99)
POTASSIUM: 3.9 mmol/L (ref 3.5–5.1)
Sodium: 142 mmol/L (ref 135–145)
Total Bilirubin: 0.6 mg/dL (ref 0.3–1.2)
Total Protein: 7.2 g/dL (ref 6.5–8.1)

## 2017-11-20 LAB — CBC WITH DIFFERENTIAL/PLATELET
Basophils Absolute: 0 10*3/uL (ref 0–0.1)
Basophils Relative: 1 %
EOS PCT: 1 %
Eosinophils Absolute: 0.1 10*3/uL (ref 0–0.7)
HEMATOCRIT: 26 % — AB (ref 40.0–52.0)
Hemoglobin: 8.9 g/dL — ABNORMAL LOW (ref 13.0–18.0)
LYMPHS ABS: 0.5 10*3/uL — AB (ref 1.0–3.6)
LYMPHS PCT: 11 %
MCH: 31.8 pg (ref 26.0–34.0)
MCHC: 34.3 g/dL (ref 32.0–36.0)
MCV: 92.9 fL (ref 80.0–100.0)
MONO ABS: 0.5 10*3/uL (ref 0.2–1.0)
Monocytes Relative: 11 %
Neutro Abs: 3.5 10*3/uL (ref 1.4–6.5)
Neutrophils Relative %: 76 %
Platelets: 114 10*3/uL — ABNORMAL LOW (ref 150–440)
RBC: 2.8 MIL/uL — AB (ref 4.40–5.90)
RDW: 18.6 % — AB (ref 11.5–14.5)
WBC: 4.6 10*3/uL (ref 3.8–10.6)

## 2017-11-20 LAB — PSA: Prostatic Specific Antigen: 0.87 ng/mL (ref 0.00–4.00)

## 2017-11-20 MED ORDER — LEUPROLIDE ACETATE (3 MONTH) 22.5 MG IM KIT
22.5000 mg | PACK | INTRAMUSCULAR | Status: DC
  Administered 2017-11-20: 22.5 mg via INTRAMUSCULAR
  Filled 2017-11-20: qty 22.5

## 2017-11-20 NOTE — Progress Notes (Signed)
Finished radiation for skin cancer yest. Has dressing on his back. He cont. To have hip and leg pain but he is getting use to it.

## 2017-11-21 ENCOUNTER — Encounter
Admission: RE | Admit: 2017-11-21 | Discharge: 2017-11-21 | Disposition: A | Discharge status: Home / Self Care | Payer: Medicare Other | Source: Ambulatory Visit | Attending: Radiation Oncology | Admitting: Radiation Oncology

## 2017-11-21 ENCOUNTER — Ambulatory Visit: Payer: Medicare Other | Admitting: Radiation Oncology

## 2017-11-21 DIAGNOSIS — C61 Malignant neoplasm of prostate: Secondary | ICD-10-CM | POA: Diagnosis present

## 2017-11-21 DIAGNOSIS — C7951 Secondary malignant neoplasm of bone: Secondary | ICD-10-CM | POA: Diagnosis present

## 2017-11-21 MED ORDER — RADIUM RA 223 DICHLORIDE 30 MCCI/ML IV SOLN
160.0600 | Freq: Once | INTRAVENOUS | Status: AC
  Administered 2017-11-21: 160.06 via INTRAVENOUS

## 2017-11-21 NOTE — Progress Notes (Signed)
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 patient is 72 year old male with a past medical history significant for hypertension diabetes and COPD among other medical problems. He was admitted to the hospital in October 2017 with some symptoms of dizziness and abdominal pain. CT angiogram abdomen incidentally showed sclerotic metastatic disease throughout the visualized spine.   2. This was followed by an MRI of the lumbar spine which showed multifocal T1 and T2 hypointense lesions with areas of increased signal on STIR sequence are seen in multiple vertebral bodies. Largest lesions are in T11 T12 L1 and in the imaged sacrum. This is consistent with metastatic disease. Primary lesion is not identified. MRI brain showed no acute intracranial abnormality.   3. Patient was noted to have an elevated PSA and was referred to urology as an outpatient. He was recently seen by Dr. Tresa Moore on 05/29/2016. PSA was repeated at that time which came back elevated at 10.3. 3 months over the value was 10.81.  4. Bone scan on 07/07/16 showed : Widespread radiotracer uptake over the axial and appendicular skeleton as described compatible with metastatic disease and correlating with sclerotic lesions on Ct. CT chest on 07/07/16 showed: Scattered sclerotic osseous metastatic lesions throughout spine, pelvis, proximal femora, ribs, and manubrium. Prostatic enlargement with thickened bladder wall question related to chronic bladder outlet obstruction.  Spiculated 12 x 11 x 7 mm LEFT upper lobe nodule question  primary pulmonary neoplasm. Stable nonspecific small LEFT adrenal nodule. Single minimally enlarged AP window lymph node. BILATERAL inguinal hernias containing fat. 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 Recommend annual imaging followup by CTA or MRA.   5. Anemia work up from 06/20/16 was as follows: CBC showed white count of 5.2, H&H of 8.1/25 and a platelet count of 164. CMP showed elevated alkaline phosphatase of 391. Multiple myeloma panel showed normal quantitative immunoglobulins and no monoclonal M protein.Vitamin B12 level was low normal at 225. LDH was mildly elevated at 234. Ferritin was low normal at 27. Reticulocyte count was 2.4% inappropriately low for the degree of anemia. Haptoglobin was elevated at 273.  6. Patients case was discussed at tumor board and plan was to watch the lung nodule with scans as it was in a difficult area to biopsy. CT guided bone biopsy was obtained which showed metastatic prostate cancer for which he is on lupron  7. Given that he does not have visceral or lymph node metastasis, docetaxel/zytigawas not started. Also patient lives alone and does not have a good social support or means of communication. Those options to be consideredat progression.   8. Bone scan on 12/22/2016 showed significantly worsening osseous metastatic disease. Left upper lobe nodule was slightly smaller in size. Zytiga was added in setting of worsening bone disease along with prednisonein aug 2018. Lupron administered 02/13/2017.  9. Scans after 3 months of zytiga showed progression of bone mets. PSA remains under control. Case discussed at tumor board and plan was to hold zytiga and proceed with [facility] for 6 months followed by repeat scans. xofigo started in jan 2019.  Patient has received 2 doses of xofigo so far   Interval history- Patient feels at his baseline. He has  chronic fatigue and exertional SOB. He also developed 2 new concerning skin lesions on his back. They were biopsied by DR. Nehemiah Massed. 1 came back as SCC and the other came back as neuroendocrine carcinoma  ECOG PS- 2 Pain scale- 0 Opioid associated constipation- no  Review of systems- Review of Systems  Constitutional: Positive for malaise/fatigue.  Respiratory: Positive for shortness of breath.      No Known Allergies   Past Medical History:  Diagnosis Date  . Anemia   . Arthritis   . Cancer [facility])    prostate  . COPD (chronic obstructive pulmonary disease) ([place])   . Coronary artery disease   . Cough    chronic  . Diabetes mellitus without complication ([place])   . Edema   . Elevated PSA   . Headache   . Hypertension   . Orthopnea   . Oxygen deficit    o2 prn  . Prostate cancer ([place])   . Shortness of breath dyspnea   . Sleep apnea    cpap     Past Surgical History:  Procedure Laterality Date  . CARDIAC CATHETERIZATION  2014  . COLONOSCOPY    . CYSTOSCOPY W/ RETROGRADES Bilateral 10/18/2016   Procedure: CYSTOSCOPY WITH RETROGRADE PYELOGRAM;  Surgeon: Hollice Espy, MD;  Location: [facility] ORS;  Service: Urology;  Laterality: Bilateral;  . EYE SURGERY    . KNEE ARTHROSCOPY    . TRANSURETHRAL RESECTION OF BLADDER TUMOR N/A 10/18/2016   Procedure: TRANSURETHRAL RESECTION OF BLADDER TUMOR (TURBT);  Surgeon: Hollice Espy, MD;  Location: [facility] ORS;  Service: Urology;  Laterality: N/A;  . TRANSURETHRAL RESECTION OF PROSTATE N/A 10/18/2016   Procedure: TRANSURETHRAL RESECTION OF THE PROSTATE (TURP) CHANNEL TURP;  Surgeon: Hollice Espy, MD;  Location: [facility] ORS;  Service: Urology;  Laterality: N/A;    Social History   Socioeconomic History  . Marital status: Single    Spouse name: Not on file  . Number of children: Not on file  . Years of education: Not on file  . Highest education level: Not on file  Occupational History  . Not on file  Social Needs  . Financial  resource strain: Not on file  . Food insecurity:    Worry: Not on file    Inability: Not on file  . Transportation needs:    Medical: Not on file    Non-medical: Not on file  Tobacco Use  . Smoking status: Current Every Day Smoker    Packs/day: 0.50    Years: 55.00    Pack years: 27.50    Types: Cigarettes  . Smokeless tobacco: Never Used  Substance and Sexual Activity  . Alcohol use: No  . Drug use: No  . Sexual activity: Not on file  Lifestyle  . Physical activity:    Days per week: Not on file    Minutes per session: Not on file  . Stress: Not on file  Relationships  . Social connections:    Talks on phone: Not on file    Gets together: Not on file    Attends religious service: Not on file    Active member of club or organization: Not on file    Attends meetings of clubs or organizations: Not on file    Relationship status: Not on file  . Intimate partner violence:    Fear of current or ex partner: Not  on file    Emotionally abused: Not on file    Physically abused: Not on file    Forced sexual activity: Not on file  Other Topics Concern  . Not on file  Social History Narrative  . Not on file    Family History  Problem Relation Age of Onset  . CVA Mother   . CAD Father      Current Outpatient Medications:  .  aspirin EC 325 MG tablet, Take 325 mg by mouth every morning., Disp: , Rfl:  .  atorvastatin (LIPITOR) 40 MG tablet, Take 1 tablet (40 mg total) by mouth daily., Disp: 30 tablet, Rfl: 0 .  cloNIDine (CATAPRES) 0.2 MG tablet, Take 1 tablet (0.2 mg total) by mouth 2 (two) times daily., Disp: 60 tablet, Rfl: 2 .  ferrous sulfate 325 (65 FE) MG EC tablet, Take 1 tablet (325 mg total) by mouth 2 (two) times daily with a meal., Disp: 60 tablet, Rfl: 3 .  finasteride (PROSCAR) 5 MG tablet, Take 5 mg by mouth daily., Disp: , Rfl:  .  Fluticasone-Salmeterol (ADVAIR) 250-50 MCG/DOSE AEPB, Inhale 1 puff into the lungs 2 (two) times daily., Disp: , Rfl:  .   GLIPIZIDE XL 10 MG 24 hr tablet, Take 10 mg by mouth 2 (two) times daily. , Disp: , Rfl:  .  ketoconazole (NIZORAL) 2 % cream, Apply 1 application topically daily as needed. , Disp: , Rfl:  .  losartan (COZAAR) 25 MG tablet, Take 25 mg by mouth daily., Disp: , Rfl:  .  metFORMIN (GLUCOPHAGE) 500 MG tablet, Take 500 mg by mouth 2 (two) times daily with a meal., Disp: , Rfl:  .  metoprolol (LOPRESSOR) 50 MG tablet, Take 50 mg by mouth 2 (two) times daily., Disp: , Rfl:  .  tamsulosin (FLOMAX) 0.4 MG CAPS capsule, Take 1 capsule (0.4 mg total) by mouth daily., Disp: 90 capsule, Rfl: 3 .  vitamin B-12 (CYANOCOBALAMIN) 1000 MCG tablet, Take 1 tablet (1,000 mcg total) by mouth daily., Disp: 30 tablet, Rfl: 3 .  abiraterone Acetate (ZYTIGA) 250 MG tablet, Take 4 tablets (1,000 mg total) by mouth daily. Take on an empty stomach 1 hour before or 2 hours after a meal, Disp: 120 tablet, Rfl: 3  Physical exam:  Vitals:   11/20/17 1411  BP: (!) 156/69  Pulse: 63  Resp: 18  Temp: 98.3 F (36.8 C)  Weight: 232 lb 11.2 oz (105.6 kg)  Height: 5' 10"  (1.778 m)   Physical Exam  Constitutional: He is oriented to person, place, and time. He appears well-developed and well-nourished. No distress.  HENT:  Head: Normocephalic and atraumatic.  Eyes: Pupils are equal, round, and reactive to light. EOM are normal.  Neck: Normal range of motion.  Cardiovascular: Normal rate, regular rhythm and normal heart sounds.  Pulmonary/Chest: Effort normal and breath sounds normal.  Abdominal: Soft. Bowel sounds are normal.  Neurological: He is alert and oriented to person, place, and time.  Skin: Skin is warm and dry.  Dressing in place over skin lesion in the midline     CMP Latest Ref Rng & Units 11/20/2017  Glucose 65 - 99 mg/dL 96  BUN 6 - 20 mg/dL 19  Creatinine 0.61 - 1.24 mg/dL 0.97  Sodium 135 - 145 mmol/L 142  Potassium 3.5 - 5.1 mmol/L 3.9  Chloride 101 - 111 mmol/L 105  CO2 22 - 32 mmol/L 27  Calcium  8.9 - 10.3 mg/dL 8.5(L)  Total Protein 6.5 -  8.1 g/dL 7.2  Total Bilirubin 0.3 - 1.2 mg/dL 0.6  Alkaline Phos 38 - 126 U/L 183(H)  AST 15 - 41 U/L 36  ALT 17 - 63 U/L 18   CBC Latest Ref Rng & Units 11/20/2017  WBC 3.8 - 10.6 K/uL 4.6  Hemoglobin 13.0 - 18.0 g/dL 8.9(L)  Hematocrit 40.0 - 52.0 % 26.0(L)  Platelets 150 - 440 K/uL 114(L)    No images are attached to the encounter.  Nm Xofigo Injection  Result Date: 11/21/2017  Trudi Ida was injected intravenously in Nuclear Medicine under the supervision of the attending radiologist   Nm Xofigo Injection  Result Date: 10/24/2017  Trudi Ida was injected intravenously in Nuclear Medicine under the supervision of the attending radiologist     Assessment and plan- Patient is a 72 y.o. male with following issues:  1. Castrate resistant prostate cancer- zytiga on hold since jan 2019 since he began [place]. He has anemia and thrombocytopenia due to xofigo (see below). He gets lupron today. He is due for 3 more doses of xofigo  2. Anemia/ thrombocytopenia- likely due to xofigo. DR. Baruch Gouty is aware regarding his counts. He received prbc tyransfusion last week. Repeat cbc in 2 weeks for possible transfusion  3. 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 He had repeat excision with Dr. Nehemiah Massed which was negative. He was also found to have a second SCC of the skin of the back which has been excised and radiated. I will proceed with PET/CT scan to ascertain if there is a primary source of neuroendocrine carcinoma or if this is skin primary. Interestingly his prostate biopsy has not shown any neuroendocrine component. It was reported as adenoca although it has not responded well to treatment so far and has not been reflective in his PSA either.  I will see him back in 2 weeks with cbc with diff. PET scan prior    Visit Diagnosis 1. Prostate cancer metastatic to bone ([place])   2. Neuroendocrine carcinoma ([place])   3. Drug-induced anemia   4. Drug-induced thrombocytopenia      Dr. Randa Evens, MD, MPH [facility] [phone] 11/21/2017 2:22 PM

## 2017-11-22 NOTE — Progress Notes (Signed)
Radiation Oncology XofigoInfusion Note  Name: YAO HYPPOLITE   Date:   11/21/2017 MRN:  [id number] DOB: 06-11-1946    This 72 y.o. male presents to the nuclear medicine for his sixth and final macro X infusion  REFERRING PROVIDER: Noreene Filbert, MD  HPI: patient is a 72 year old male undergoing therapy for castrate resistant stage IV prostate cancer seen today for his sixth Xofigo infusion. 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 Pain is under good control.  COMPLICATIONS OF TREATMENT: none  FOLLOW UP COMPLIANCE: keeps appointments   PHYSICAL EXAM:  There were no vitals taken for this visit. Well-developed well-nourished patient in NAD. HEENT reveals PERLA, EOMI, discs not visualized.  Oral cavity is clear. No oral mucosal lesions are identified. Neck is clear without evidence of cervical or supraclavicular adenopathy. Lungs are clear to A&P. Cardiac examination is essentially unremarkable with regular rate and rhythm without murmur rub or thrill. Abdomen is benign with no organomegaly or masses noted. Motor sensory and DTR levels are equal and symmetric in the upper and lower extremities. Cranial nerves II through XII are grossly intact. Proprioception is intact. No peripheral adenopathy or edema is identified. No motor or sensory levels are noted. Crude visual fields are within normal range.  RADIOLOGY RESULTS: no current films for review  PLAN: Peripheral line was started on the patient and 20 cc of normal saline were passed to make sure there was patency of the lines. Trudi Ida was administered over a 5 minute infusion push by nuclear medicine technologist supervised by radiation oncologist. After completion of IV push of Xofigo 30 cc an additional saline were passed through the peripheral line. Oral lines syringes drapes and original container of Xofigo were then taken to nuclear medicine for storage.  Patient tolerated the procedure well without side effect or complaint. Patient has anti-emetic medication. Have scheduled the patient for a three-week followup to check on his counts. Patient is to call sooner with any side effects or complaints.    Noreene Filbert, MD

## 2017-12-04 ENCOUNTER — Ambulatory Visit: Payer: Medicare Other | Admitting: Urology

## 2017-12-05 ENCOUNTER — Inpatient Hospital Stay: Payer: Medicare Other

## 2017-12-05 DIAGNOSIS — C61 Malignant neoplasm of prostate: Secondary | ICD-10-CM

## 2017-12-05 DIAGNOSIS — C7951 Secondary malignant neoplasm of bone: Principal | ICD-10-CM

## 2017-12-05 LAB — CBC WITH DIFFERENTIAL/PLATELET
BAND NEUTROPHILS: 7 %
BLASTS: 0 %
Basophils Absolute: 0.1 10*3/uL (ref 0–0.1)
Basophils Relative: 1 %
Eosinophils Absolute: 0.1 10*3/uL (ref 0–0.7)
Eosinophils Relative: 2 %
HEMATOCRIT: 22.7 % — AB (ref 40.0–52.0)
HEMOGLOBIN: 7.8 g/dL — AB (ref 13.0–18.0)
Lymphocytes Relative: 16 %
Lymphs Abs: 1 10*3/uL (ref 1.0–3.6)
MCH: 32.5 pg (ref 26.0–34.0)
MCHC: 34.5 g/dL (ref 32.0–36.0)
MCV: 94.3 fL (ref 80.0–100.0)
METAMYELOCYTES PCT: 3 %
MONO ABS: 0.9 10*3/uL (ref 0.2–1.0)
MONOS PCT: 14 %
Myelocytes: 1 %
Neutro Abs: 4 10*3/uL (ref 1.4–6.5)
Neutrophils Relative %: 56 %
Other: 0 %
Platelets: 84 10*3/uL — ABNORMAL LOW (ref 150–440)
Promyelocytes Relative: 0 %
RBC: 2.41 MIL/uL — ABNORMAL LOW (ref 4.40–5.90)
RDW: 18.3 % — ABNORMAL HIGH (ref 11.5–14.5)
SMEAR REVIEW: DECREASED
WBC: 6.1 10*3/uL (ref 3.8–10.6)
nRBC: 0 /100 WBC

## 2017-12-05 LAB — SAMPLE TO BLOOD BANK

## 2017-12-06 ENCOUNTER — Ambulatory Visit (INDEPENDENT_AMBULATORY_CARE_PROVIDER_SITE_OTHER): Payer: Medicare Other | Admitting: Urology

## 2017-12-06 ENCOUNTER — Encounter: Payer: Self-pay | Admitting: Urology

## 2017-12-06 VITALS — BP 135/61 | HR 64 | Ht 70.0 in | Wt 230.0 lb

## 2017-12-06 DIAGNOSIS — R3915 Urgency of urination: Secondary | ICD-10-CM

## 2017-12-06 DIAGNOSIS — R351 Nocturia: Secondary | ICD-10-CM

## 2017-12-06 DIAGNOSIS — C61 Malignant neoplasm of prostate: Secondary | ICD-10-CM | POA: Diagnosis not present

## 2017-12-06 LAB — BLADDER SCAN AMB NON-IMAGING

## 2017-12-06 NOTE — Progress Notes (Signed)
12/06/2017 8:29 PM   Michael Mathews 06-02-1946 [id number]  Referring provider: [name], MD [address] [place], [place] [address]  Chief Complaint  Patient presents with  . Prostate Cancer    68month     HPI: 72 year old male with advanced metastatic castrate resistant prostate cancer who returns today for routine follow-up.  He was last seen on 05/2017 for follow-up of his urinary symptoms.  He underwent bladder biopsy/channel TURP on 10/18/2017.  Surgical pathology was consistent with cystitis and urothelial dysplasia but did not meet the criteria for CIS.  TURP chips were consistent with Gleason 4+ prostate cancer.  He is under the care of both his medical oncologist, Dr. [name] radiation oncology, Dr. Baruch Gouty.  Most recently, he has had progression of his bony metastatic disease and will be undergoing treatment with radium 223.  He is also on Lupron, Zytiga and prednisone.  His PSA has been a poor biochemical marker as it is remained quite low.  Today, he has a decent urinary stream and is pleased.  He is getting up 3-4x at night which is only mildy bothersome.   He denies any significant urinary frequency or urgency.  He believes he is still on finasteride and Flomax.  PVR today is minimal.   PMH: Past Medical History:  Diagnosis Date  . Anemia   . Arthritis   . Cancer [facility])    prostate  . COPD (chronic obstructive pulmonary disease) ([place])   . Coronary artery disease   . Cough    chronic  . Diabetes mellitus without complication ([place])   . Edema   . Elevated PSA   . Headache   . Hypertension   . Orthopnea   . Oxygen deficit    o2 prn  . Prostate cancer ([place])   . Shortness of breath dyspnea   . Sleep apnea    cpap    Surgical History: Past Surgical History:  Procedure Laterality Date  . CARDIAC CATHETERIZATION  2014  . COLONOSCOPY    . CYSTOSCOPY W/ RETROGRADES Bilateral 10/18/2016   Procedure: CYSTOSCOPY WITH RETROGRADE PYELOGRAM;  Surgeon:  Hollice Espy, MD;  Location: [facility] ORS;  Service: Urology;  Laterality: Bilateral;  . EYE SURGERY    . KNEE ARTHROSCOPY    . TRANSURETHRAL RESECTION OF BLADDER TUMOR N/A 10/18/2016   Procedure: TRANSURETHRAL RESECTION OF BLADDER TUMOR (TURBT);  Surgeon: Hollice Espy, MD;  Location: [facility] ORS;  Service: Urology;  Laterality: N/A;  . TRANSURETHRAL RESECTION OF PROSTATE N/A 10/18/2016   Procedure: TRANSURETHRAL RESECTION OF THE PROSTATE (TURP) CHANNEL TURP;  Surgeon: Hollice Espy, MD;  Location: [facility] ORS;  Service: Urology;  Laterality: N/A;    Home Medications:  Allergies as of 12/06/2017   No Known Allergies     Medication List        Accurate as of 12/06/17  8:29 PM. Always use your most recent med list.          abiraterone acetate 250 MG tablet Commonly known as:  ZYTIGA Take 4 tablets (1,000 mg total) by mouth daily. Take on an empty stomach 1 hour before or 2 hours after a meal   aspirin EC 325 MG tablet Take 325 mg by mouth every morning.   atorvastatin 40 MG tablet Commonly known as:  LIPITOR Take 1 tablet (40 mg total) by mouth daily.   cloNIDine 0.2 MG tablet Commonly known as:  CATAPRES Take 1 tablet (0.2 mg total) by mouth 2 (two) times daily.  DULoxetine 30 MG capsule Commonly known as:  CYMBALTA   ferrous sulfate 325 (65 FE) MG EC tablet Take 1 tablet (325 mg total) by mouth 2 (two) times daily with a meal.   finasteride 5 MG tablet Commonly known as:  PROSCAR Take 5 mg by mouth daily.   Fluticasone-Salmeterol 250-50 MCG/DOSE Aepb Commonly known as:  ADVAIR Inhale 1 puff into the lungs 2 (two) times daily.   GLIPIZIDE XL 10 MG 24 hr tablet Generic drug:  glipiZIDE Take 10 mg by mouth 2 (two) times daily.   ketoconazole 2 % cream Commonly known as:  NIZORAL Apply 1 application topically daily as needed.   losartan 25 MG tablet Commonly known as:  COZAAR Take 25 mg by mouth daily.   metFORMIN 500 MG tablet Commonly known as:   GLUCOPHAGE Take 500 mg by mouth 2 (two) times daily with a meal.   metoprolol tartrate 50 MG tablet Commonly known as:  LOPRESSOR Take 50 mg by mouth 2 (two) times daily.   tamsulosin 0.4 MG Caps capsule Commonly known as:  FLOMAX Take 1 capsule (0.4 mg total) by mouth daily.   vitamin B-12 1000 MCG tablet Commonly known as:  CYANOCOBALAMIN Take 1 tablet (1,000 mcg total) by mouth daily.       Allergies: No Known Allergies  Family History: Family History  Problem Relation Age of Onset  . CVA Mother   . CAD Father     Social History:  reports that he has been smoking cigarettes.  He has a 27.50 pack-year smoking history. He has never used smokeless tobacco. He reports that he does not drink alcohol or use drugs.  ROS: UROLOGY Frequent Urination?: No Hard to postpone urination?: No Burning/pain with urination?: No Get up at night to urinate?: Yes Leakage of urine?: No Urine stream starts and stops?: No Trouble starting stream?: No Do you have to strain to urinate?: No Blood in urine?: No Urinary tract infection?: No Sexually transmitted disease?: No Injury to kidneys or bladder?: No Painful intercourse?: No Weak stream?: No Erection problems?: No Penile pain?: No  Gastrointestinal Nausea?: No Vomiting?: No Indigestion/heartburn?: No Diarrhea?: No Constipation?: No  Constitutional Fever: No Night sweats?: No Weight loss?: No Fatigue?: No  Skin Skin rash/lesions?: No Itching?: No  Eyes Blurred vision?: Yes Double vision?: No  Ears/Nose/Throat Sore throat?: No Sinus problems?: No  Hematologic/Lymphatic Swollen glands?: No Easy bruising?: No  Cardiovascular Leg swelling?: Yes Chest pain?: No  Respiratory Cough?: No Shortness of breath?: Yes  Endocrine Excessive thirst?: No  Musculoskeletal Back pain?: Yes Joint pain?: No  Neurological Headaches?: Yes Dizziness?: No  Psychologic Depression?: No Anxiety?: No  Physical  Exam: BP 135/61   Pulse 64   Ht 5\' 10"  (1.778 m)   Wt 230 lb (104.3 kg)   BMI 33.00 kg/m   Constitutional:  Alert and oriented, No acute distress.  Pleasant. HEENT: [place] AT, moist mucus membranes.  Trachea midline, no masses. Cardiovascular: No clubbing, cyanosis, or edema. Respiratory: Normal respiratory effort, no increased work of breathing. GI: Abdomen is soft, nontender, nondistended, no abdominal massesy. Skin: No rashes, bruises or suspicious lesions. Neurologic: Grossly intact, no focal deficits, moving all 4 extremities. Psychiatric: Normal mood and affect.  Laboratory Data: Lab Results  Component Value Date   WBC 6.1 12/05/2017   HGB 7.8 (L) 12/05/2017   HCT 22.7 (L) 12/05/2017   MCV 94.3 12/05/2017   PLT 84 (L) 12/05/2017    Lab Results  Component Value Date  CREATININE 0.97 11/20/2017   Component     Latest Ref Rng & Units 12/12/2016 02/27/2017 03/30/2017 04/27/2017  Prostatic Specific Antigen     0.00 - 4.00 ng/mL 1.14 1.07 0.59 1.04   Component     Latest Ref Rng & Units 05/17/2017 08/15/2017 11/15/2017 11/20/2017  Prostatic Specific Antigen     0.00 - 4.00 ng/mL 0.80 0.45 0.69 0.87     Lab Results  Component Value Date   TESTOSTERONE <3 (L) 03/30/2017    Lab Results  Component Value Date   HGBA1C 5.7 (H) 01/19/2017    Urinalysis N/a  Pertinent Imaging: PET scan 12/2017 pending  Results for orders placed or performed in visit on 12/06/17  BLADDER SCAN AMB NON-IMAGING  Result Value Ref Range   Scan Result 40ml    Assessment & Plan:    1. Prostate cancer ([place]) Castrate resistant metastatic prostate cancer managed by Dr. Baruch Gouty and Dr. [name] Recently status post Trudi Ida, doing well PET scan pending  2. Nocturia Primary urinary complaint today Otherwise urinary symptoms are well controlled in satisfactory Adequate bladder emptying today Not interested in any further intervention for nocturia We discussed at length today indications for return  to urology including development of significant urinary symptoms including sensation of incomplete bladder emptying, weak stream, amongst others.  We discussed that in the future, he may require a repeat outlet reduction procedure He understands all of this as well as warning symptoms He is agreeable to return as needed - BLADDER SCAN AMB NON-IMAGING   Return if symptoms worsen or fail to improve.  Hollice Espy, MD  [facility] [address] [place], [place] [address] [phone]

## 2017-12-11 ENCOUNTER — Encounter
Admission: RE | Admit: 2017-12-11 | Discharge: 2017-12-11 | Disposition: A | Discharge status: Home / Self Care | Payer: Medicare Other | Source: Ambulatory Visit | Attending: Oncology | Admitting: Oncology

## 2017-12-11 ENCOUNTER — Other Ambulatory Visit: Payer: Self-pay

## 2017-12-11 ENCOUNTER — Inpatient Hospital Stay: Payer: Medicare Other | Attending: Internal Medicine

## 2017-12-11 DIAGNOSIS — C61 Malignant neoplasm of prostate: Secondary | ICD-10-CM

## 2017-12-11 DIAGNOSIS — C787 Secondary malignant neoplasm of liver and intrahepatic bile duct: Secondary | ICD-10-CM | POA: Insufficient documentation

## 2017-12-11 DIAGNOSIS — C7951 Secondary malignant neoplasm of bone: Secondary | ICD-10-CM | POA: Insufficient documentation

## 2017-12-11 DIAGNOSIS — Z79899 Other long term (current) drug therapy: Secondary | ICD-10-CM | POA: Diagnosis not present

## 2017-12-11 DIAGNOSIS — D649 Anemia, unspecified: Secondary | ICD-10-CM | POA: Insufficient documentation

## 2017-12-11 DIAGNOSIS — C779 Secondary and unspecified malignant neoplasm of lymph node, unspecified: Secondary | ICD-10-CM | POA: Insufficient documentation

## 2017-12-11 DIAGNOSIS — I129 Hypertensive chronic kidney disease with stage 1 through stage 4 chronic kidney disease, or unspecified chronic kidney disease: Secondary | ICD-10-CM | POA: Insufficient documentation

## 2017-12-11 DIAGNOSIS — C7A1 Malignant poorly differentiated neuroendocrine tumors: Secondary | ICD-10-CM | POA: Diagnosis not present

## 2017-12-11 DIAGNOSIS — N179 Acute kidney failure, unspecified: Secondary | ICD-10-CM | POA: Diagnosis not present

## 2017-12-11 LAB — BASIC METABOLIC PANEL
ANION GAP: 8 (ref 5–15)
BUN: 16 mg/dL (ref 8–23)
CALCIUM: 8.5 mg/dL — AB (ref 8.9–10.3)
CO2: 23 mmol/L (ref 22–32)
Chloride: 105 mmol/L (ref 98–111)
Creatinine, Ser: 0.9 mg/dL (ref 0.61–1.24)
GLUCOSE: 63 mg/dL — AB (ref 70–99)
POTASSIUM: 3.9 mmol/L (ref 3.5–5.1)
Sodium: 136 mmol/L (ref 135–145)

## 2017-12-11 LAB — GLUCOSE, CAPILLARY: Glucose-Capillary: 41 mg/dL — CL (ref 70–99)

## 2017-12-11 MED ORDER — FLUDEOXYGLUCOSE F - 18 (FDG) INJECTION
11.9000 | Freq: Once | INTRAVENOUS | Status: AC | PRN
  Administered 2017-12-11: 11.9 via INTRAVENOUS

## 2017-12-12 LAB — GLUCOSE, CAPILLARY
GLUCOSE-CAPILLARY: 37 mg/dL — AB (ref 70–99)
Glucose-Capillary: 37 mg/dL — CL (ref 70–99)

## 2017-12-20 ENCOUNTER — Inpatient Hospital Stay: Payer: Medicare Other | Admitting: Oncology

## 2017-12-20 ENCOUNTER — Inpatient Hospital Stay: Payer: Medicare Other

## 2017-12-27 ENCOUNTER — Other Ambulatory Visit: Payer: Self-pay | Admitting: *Deleted

## 2017-12-27 DIAGNOSIS — R932 Abnormal findings on diagnostic imaging of liver and biliary tract: Secondary | ICD-10-CM

## 2017-12-27 DIAGNOSIS — C61 Malignant neoplasm of prostate: Secondary | ICD-10-CM

## 2017-12-27 DIAGNOSIS — C7951 Secondary malignant neoplasm of bone: Principal | ICD-10-CM

## 2017-12-28 ENCOUNTER — Encounter: Payer: Self-pay | Admitting: Internal Medicine

## 2017-12-28 ENCOUNTER — Telehealth: Payer: Self-pay | Admitting: *Deleted

## 2017-12-28 ENCOUNTER — Encounter: Payer: Self-pay | Admitting: Oncology

## 2017-12-28 ENCOUNTER — Other Ambulatory Visit: Payer: Self-pay

## 2017-12-28 ENCOUNTER — Inpatient Hospital Stay: Payer: Medicare Other

## 2017-12-28 ENCOUNTER — Inpatient Hospital Stay
Admission: AD | Admit: 2017-12-28 | Discharge: 2017-12-30 | DRG: 723 | Disposition: A | Discharge status: Home / Self Care | Payer: Medicare Other | Source: Ambulatory Visit | Attending: Internal Medicine | Admitting: Internal Medicine

## 2017-12-28 ENCOUNTER — Inpatient Hospital Stay (HOSPITAL_BASED_OUTPATIENT_CLINIC_OR_DEPARTMENT_OTHER): Payer: Medicare Other | Admitting: Oncology

## 2017-12-28 VITALS — BP 144/72 | HR 125 | Temp 97.2°F | Resp 18 | Ht 70.0 in | Wt 225.3 lb

## 2017-12-28 DIAGNOSIS — E119 Type 2 diabetes mellitus without complications: Secondary | ICD-10-CM | POA: Diagnosis present

## 2017-12-28 DIAGNOSIS — N400 Enlarged prostate without lower urinary tract symptoms: Secondary | ICD-10-CM | POA: Diagnosis present

## 2017-12-28 DIAGNOSIS — C7951 Secondary malignant neoplasm of bone: Secondary | ICD-10-CM | POA: Diagnosis present

## 2017-12-28 DIAGNOSIS — C787 Secondary malignant neoplasm of liver and intrahepatic bile duct: Secondary | ICD-10-CM | POA: Diagnosis present

## 2017-12-28 DIAGNOSIS — Z7984 Long term (current) use of oral hypoglycemic drugs: Secondary | ICD-10-CM | POA: Diagnosis not present

## 2017-12-28 DIAGNOSIS — C61 Malignant neoplasm of prostate: Secondary | ICD-10-CM | POA: Diagnosis present

## 2017-12-28 DIAGNOSIS — J449 Chronic obstructive pulmonary disease, unspecified: Secondary | ICD-10-CM | POA: Diagnosis present

## 2017-12-28 DIAGNOSIS — I2510 Atherosclerotic heart disease of native coronary artery without angina pectoris: Secondary | ICD-10-CM | POA: Diagnosis present

## 2017-12-28 DIAGNOSIS — Z7951 Long term (current) use of inhaled steroids: Secondary | ICD-10-CM | POA: Diagnosis not present

## 2017-12-28 DIAGNOSIS — M7989 Other specified soft tissue disorders: Secondary | ICD-10-CM | POA: Diagnosis present

## 2017-12-28 DIAGNOSIS — C4490 Unspecified malignant neoplasm of skin, unspecified: Secondary | ICD-10-CM | POA: Diagnosis present

## 2017-12-28 DIAGNOSIS — D649 Anemia, unspecified: Secondary | ICD-10-CM | POA: Diagnosis present

## 2017-12-28 DIAGNOSIS — I4891 Unspecified atrial fibrillation: Secondary | ICD-10-CM | POA: Diagnosis present

## 2017-12-28 DIAGNOSIS — Z7982 Long term (current) use of aspirin: Secondary | ICD-10-CM

## 2017-12-28 DIAGNOSIS — E785 Hyperlipidemia, unspecified: Secondary | ICD-10-CM | POA: Diagnosis present

## 2017-12-28 DIAGNOSIS — Z8249 Family history of ischemic heart disease and other diseases of the circulatory system: Secondary | ICD-10-CM | POA: Diagnosis not present

## 2017-12-28 DIAGNOSIS — I10 Essential (primary) hypertension: Secondary | ICD-10-CM | POA: Diagnosis present

## 2017-12-28 DIAGNOSIS — F17210 Nicotine dependence, cigarettes, uncomplicated: Secondary | ICD-10-CM | POA: Diagnosis present

## 2017-12-28 DIAGNOSIS — I129 Hypertensive chronic kidney disease with stage 1 through stage 4 chronic kidney disease, or unspecified chronic kidney disease: Secondary | ICD-10-CM

## 2017-12-28 DIAGNOSIS — D630 Anemia in neoplastic disease: Secondary | ICD-10-CM | POA: Diagnosis present

## 2017-12-28 DIAGNOSIS — Z79899 Other long term (current) drug therapy: Secondary | ICD-10-CM

## 2017-12-28 DIAGNOSIS — N179 Acute kidney failure, unspecified: Secondary | ICD-10-CM | POA: Diagnosis present

## 2017-12-28 DIAGNOSIS — E538 Deficiency of other specified B group vitamins: Secondary | ICD-10-CM | POA: Diagnosis present

## 2017-12-28 DIAGNOSIS — Z823 Family history of stroke: Secondary | ICD-10-CM

## 2017-12-28 DIAGNOSIS — R110 Nausea: Secondary | ICD-10-CM

## 2017-12-28 LAB — CBC WITH DIFFERENTIAL/PLATELET
Basophils Absolute: 0 10*3/uL (ref 0–0.1)
Basophils Relative: 1 %
EOS ABS: 0 10*3/uL (ref 0–0.7)
EOS PCT: 1 %
HCT: 15.1 % — ABNORMAL LOW (ref 40.0–52.0)
Hemoglobin: 5 g/dL — ABNORMAL LOW (ref 13.0–18.0)
LYMPHS ABS: 0.5 10*3/uL — AB (ref 1.0–3.6)
Lymphocytes Relative: 9 %
MCH: 32.2 pg (ref 26.0–34.0)
MCHC: 32.8 g/dL (ref 32.0–36.0)
MCV: 98.2 fL (ref 80.0–100.0)
MONO ABS: 0.5 10*3/uL (ref 0.2–1.0)
MONOS PCT: 10 %
Neutro Abs: 4 10*3/uL (ref 1.4–6.5)
Neutrophils Relative %: 79 %
PLATELETS: 104 10*3/uL — AB (ref 150–440)
RBC: 1.54 MIL/uL — ABNORMAL LOW (ref 4.40–5.90)
RDW: 20.3 % — ABNORMAL HIGH (ref 11.5–14.5)
WBC: 5.1 10*3/uL (ref 3.8–10.6)

## 2017-12-28 LAB — PREPARE RBC (CROSSMATCH)

## 2017-12-28 LAB — COMPREHENSIVE METABOLIC PANEL
ALBUMIN: 2.8 g/dL — AB (ref 3.5–5.0)
ALT: 22 U/L (ref 0–44)
AST: 50 U/L — AB (ref 15–41)
Alkaline Phosphatase: 245 U/L — ABNORMAL HIGH (ref 38–126)
Anion gap: 11 (ref 5–15)
BUN: 32 mg/dL — AB (ref 8–23)
CHLORIDE: 106 mmol/L (ref 98–111)
CO2: 21 mmol/L — AB (ref 22–32)
CREATININE: 2.13 mg/dL — AB (ref 0.61–1.24)
Calcium: 8.2 mg/dL — ABNORMAL LOW (ref 8.9–10.3)
GFR calc Af Amer: 34 mL/min — ABNORMAL LOW (ref >60)
GFR, EST NON AFRICAN AMERICAN: 29 mL/min — AB (ref >60)
GLUCOSE: 103 mg/dL — AB (ref 70–99)
Potassium: 3.6 mmol/L (ref 3.5–5.1)
SODIUM: 138 mmol/L (ref 135–145)
Total Bilirubin: 1.2 mg/dL (ref 0.3–1.2)
Total Protein: 7.3 g/dL (ref 6.5–8.1)

## 2017-12-28 LAB — SAMPLE TO BLOOD BANK

## 2017-12-28 MED ORDER — SODIUM CHLORIDE 0.9% IV SOLUTION
Freq: Once | INTRAVENOUS | Status: AC
  Administered 2017-12-28: 21:00:00 via INTRAVENOUS

## 2017-12-28 MED ORDER — SODIUM CHLORIDE 0.9 % IV SOLN
Freq: Once | INTRAVENOUS | Status: AC
  Administered 2017-12-28: 14:00:00 via INTRAVENOUS
  Filled 2017-12-28: qty 1000

## 2017-12-28 MED ORDER — FERROUS SULFATE 325 (65 FE) MG PO TABS
325.0000 mg | ORAL_TABLET | Freq: Two times a day (BID) | ORAL | Status: DC
  Administered 2017-12-29 – 2017-12-30 (×3): 325 mg via ORAL
  Filled 2017-12-28 (×3): qty 1

## 2017-12-28 MED ORDER — VITAMIN B-12 1000 MCG PO TABS
1000.0000 ug | ORAL_TABLET | Freq: Every day | ORAL | Status: DC
  Administered 2017-12-29 – 2017-12-30 (×2): 1000 ug via ORAL
  Filled 2017-12-28 (×2): qty 1

## 2017-12-28 MED ORDER — OLANZAPINE 10 MG PO TABS
10.0000 mg | ORAL_TABLET | Freq: Every day | ORAL | Status: DC
  Administered 2017-12-29: 22:00:00 10 mg via ORAL
  Filled 2017-12-28 (×3): qty 1

## 2017-12-28 MED ORDER — PANTOPRAZOLE SODIUM 40 MG PO TBEC
40.0000 mg | DELAYED_RELEASE_TABLET | Freq: Every day | ORAL | Status: DC
  Administered 2017-12-29 – 2017-12-30 (×2): 40 mg via ORAL
  Filled 2017-12-28 (×2): qty 1

## 2017-12-28 MED ORDER — INSULIN ASPART 100 UNIT/ML ~~LOC~~ SOLN
0.0000 [IU] | Freq: Three times a day (TID) | SUBCUTANEOUS | Status: DC
  Administered 2017-12-29 (×2): 1 [IU] via SUBCUTANEOUS
  Filled 2017-12-28 (×2): qty 1

## 2017-12-28 MED ORDER — INSULIN ASPART 100 UNIT/ML ~~LOC~~ SOLN
0.0000 [IU] | Freq: Every day | SUBCUTANEOUS | Status: DC
  Administered 2017-12-29: 2 [IU] via SUBCUTANEOUS
  Filled 2017-12-28: qty 1

## 2017-12-28 MED ORDER — ACETAMINOPHEN 650 MG RE SUPP: 650.0000 mg | Freq: Four times a day (QID) | RECTAL | Status: DC | PRN

## 2017-12-28 MED ORDER — SODIUM CHLORIDE 0.9 % IV SOLN: Freq: Once | INTRAVENOUS | Status: DC

## 2017-12-28 MED ORDER — DEXAMETHASONE SODIUM PHOSPHATE 10 MG/ML IJ SOLN
10.0000 mg | Freq: Once | INTRAMUSCULAR | Status: AC
  Administered 2017-12-28: 10 mg via INTRAVENOUS
  Filled 2017-12-28: qty 1

## 2017-12-28 MED ORDER — OLANZAPINE 10 MG PO TABS: 10.0000 mg | ORAL_TABLET | Freq: Every day | ORAL | 0 refills | Status: DC

## 2017-12-28 MED ORDER — METOPROLOL TARTRATE 50 MG PO TABS
50.0000 mg | ORAL_TABLET | Freq: Two times a day (BID) | ORAL | Status: DC
  Administered 2017-12-29 – 2017-12-30 (×4): 50 mg via ORAL
  Filled 2017-12-28 (×4): qty 1

## 2017-12-28 MED ORDER — ACETAMINOPHEN 325 MG PO TABS
650.0000 mg | ORAL_TABLET | Freq: Four times a day (QID) | ORAL | Status: DC | PRN
  Administered 2017-12-28: 650 mg via ORAL
  Filled 2017-12-28: qty 2

## 2017-12-28 MED ORDER — TAMSULOSIN HCL 0.4 MG PO CAPS
0.4000 mg | ORAL_CAPSULE | Freq: Every day | ORAL | Status: DC
  Administered 2017-12-29 – 2017-12-30 (×2): 0.4 mg via ORAL
  Filled 2017-12-28 (×2): qty 1

## 2017-12-28 MED ORDER — ABIRATERONE ACETATE 250 MG PO TABS: 1000.0000 mg | ORAL_TABLET | Freq: Every day | ORAL | Status: DC

## 2017-12-28 MED ORDER — FUROSEMIDE 10 MG/ML IJ SOLN
40.0000 mg | Freq: Once | INTRAMUSCULAR | Status: AC
  Administered 2017-12-29: 40 mg via INTRAVENOUS
  Filled 2017-12-28: qty 4

## 2017-12-28 MED ORDER — ONDANSETRON HCL 4 MG/2ML IJ SOLN
4.0000 mg | Freq: Once | INTRAMUSCULAR | Status: AC
  Administered 2017-12-28: 4 mg via INTRAVENOUS
  Filled 2017-12-28: qty 2

## 2017-12-28 MED ORDER — FINASTERIDE 5 MG PO TABS
5.0000 mg | ORAL_TABLET | Freq: Every day | ORAL | Status: DC
  Administered 2017-12-29 – 2017-12-30 (×2): 5 mg via ORAL
  Filled 2017-12-28 (×2): qty 1

## 2017-12-28 MED ORDER — FUROSEMIDE 10 MG/ML IJ SOLN
40.0000 mg | Freq: Once | INTRAMUSCULAR | Status: AC
  Administered 2017-12-28: 40 mg via INTRAVENOUS
  Filled 2017-12-28: qty 4

## 2017-12-28 MED ORDER — ATORVASTATIN CALCIUM 20 MG PO TABS
40.0000 mg | ORAL_TABLET | Freq: Every day | ORAL | Status: DC
  Administered 2017-12-29: 17:00:00 40 mg via ORAL
  Filled 2017-12-28: qty 2

## 2017-12-28 MED ORDER — ONDANSETRON HCL 4 MG PO TABS: 4.0000 mg | ORAL_TABLET | Freq: Three times a day (TID) | ORAL | 0 refills | Status: DC | PRN

## 2017-12-28 MED ORDER — MOMETASONE FURO-FORMOTEROL FUM 200-5 MCG/ACT IN AERO
2.0000 | INHALATION_SPRAY | Freq: Two times a day (BID) | RESPIRATORY_TRACT | Status: DC
  Administered 2017-12-29 – 2017-12-30 (×3): 2 via RESPIRATORY_TRACT
  Filled 2017-12-28: qty 8.8

## 2017-12-28 MED ORDER — DULOXETINE HCL 30 MG PO CPEP
30.0000 mg | ORAL_CAPSULE | Freq: Every day | ORAL | Status: DC
  Administered 2017-12-29 – 2017-12-30 (×2): 30 mg via ORAL
  Filled 2017-12-28 (×2): qty 1

## 2017-12-28 MED ORDER — CLONIDINE HCL 0.1 MG PO TABS
0.2000 mg | ORAL_TABLET | Freq: Two times a day (BID) | ORAL | Status: DC
  Administered 2017-12-29 (×2): 0.2 mg via ORAL
  Filled 2017-12-28 (×2): qty 2

## 2017-12-28 MED ORDER — DIPHENHYDRAMINE HCL 25 MG PO CAPS
25.0000 mg | ORAL_CAPSULE | Freq: Once | ORAL | Status: AC
  Administered 2017-12-28: 20:00:00 25 mg via ORAL
  Filled 2017-12-28: qty 1

## 2017-12-28 MED ORDER — NICOTINE 14 MG/24HR TD PT24: 14.0000 mg | MEDICATED_PATCH | Freq: Every day | TRANSDERMAL | Status: DC | PRN

## 2017-12-28 NOTE — Telephone Encounter (Signed)
Spoke to pt while in the office and gave him appt date for bx 7/26 and arrive at 9:30 for 10:30 procedure.  He will need to be NPO after midnight the night before the procedure.  I can arrange for Lucianne Lei transportation to bring him in but he will need to find a ride to come pick him up because he will have conscious sedation for the bx and the driver can't be responsible to take him home due to he can be drowsy . Patient will ask a neighbor to see if it is ok for them to be able to come and get him from hospital. I will check with him next week.

## 2017-12-28 NOTE — Progress Notes (Signed)
dzziness sitting at home but when he gets up he has it to. She has feet swelling bilaterally, feet and legs painful. Heart rate elevated. Pt feels they he has dry heaves with water. Not drank or ate anything today and did not take am pills today. He had b/p pill in his pack he was going to hold but does not know which one pill is the b/p pill because it is all in 1 blister pack. He is weak, fatigued. Sob on slightest exertion.

## 2017-12-28 NOTE — H&P (Addendum)
[facility] NAME: Michael Mathews    MR#:  [id number]  DATE OF BIRTH:  [birth year]  DATE OF ADMISSION:  12/28/2017  PRIMARY CARE PHYSICIAN: [name], MD   REQUESTING/REFERRING PHYSICIAN: Dr Randa Evens  CHIEF COMPLAINT:  Sent in for hemoglobin of 5.0.  HISTORY OF PRESENT ILLNESS:  Michael Mathews  is a 72 y.o. male with a known history of metastatic prostate cancer.  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  Patient states he feels terrible.  He feels dizzy when he moves around and short of breath when he moves around.  He has a constant headache.  His ankles and legs are swollen.  He states he has been doing radiation for 5 weeks for a spot on his back.  Patient was sent in for hemoglobin of 5.0.  PAST MEDICAL HISTORY:   Past Medical History:  Diagnosis Date  . Anemia   . Arthritis   . Cancer [facility])    prostate  . COPD (chronic obstructive pulmonary disease) ([place])   . Coronary artery disease   . Cough    chronic  . Diabetes mellitus without complication ([place])   . Edema   . Elevated PSA   . Headache   . Hypertension   . Orthopnea   . Oxygen deficit    o2 prn  . Prostate cancer ([place])   . Shortness of breath dyspnea   . Sleep apnea    cpap    PAST SURGICAL HISTORY:   Past Surgical History:  Procedure Laterality Date  . CARDIAC CATHETERIZATION  2014  . COLONOSCOPY    . CYSTOSCOPY W/ RETROGRADES Bilateral 10/18/2016   Procedure: CYSTOSCOPY WITH RETROGRADE PYELOGRAM;  Surgeon: Hollice Espy, MD;  Location: [facility] ORS;  Service: Urology;  Laterality: Bilateral;  . EYE SURGERY    . KNEE ARTHROSCOPY    . TRANSURETHRAL RESECTION OF BLADDER TUMOR N/A 10/18/2016   Procedure: TRANSURETHRAL RESECTION OF BLADDER TUMOR (TURBT);  Surgeon: Hollice Espy, MD;  Location: [facility] ORS;  Service: Urology;  Laterality: N/A;  . TRANSURETHRAL RESECTION OF PROSTATE N/A 10/18/2016   Procedure: TRANSURETHRAL RESECTION OF THE PROSTATE (TURP) CHANNEL TURP;  Surgeon: Hollice Espy, MD;  Location: [facility] ORS;  Service: Urology;  Laterality: N/A;    SOCIAL HISTORY:   Social History   Tobacco Use  . Smoking status: Current Every Day Smoker    Packs/day: 0.50    Years: 55.00    Pack years: 27.50    Types: Cigarettes  . Smokeless tobacco: Never Used  Substance Use Topics  . Alcohol use: No    FAMILY HISTORY:   Family History  Problem Relation Age of Onset  . CVA Mother   . CAD Father     DRUG ALLERGIES:  No Known Allergies  REVIEW OF SYSTEMS:  CONSTITUTIONAL: No fever.  Always feels cold.  Positive for fatigue.  No weight loss.  Positive for headache. EYES: No blurred or double vision.  Has a cataract in his left eye. EARS, NOSE, AND THROAT: No tinnitus or ear pain. No sore throat RESPIRATORY: Positive for cough and  shortness of breath.  No wheezing or hemoptysis.  CARDIOVASCULAR: No chest pain, orthopnea, edema.  GASTROINTESTINAL: No nausea, vomiting, diarrhea or abdominal pain. No blood in bowel movements GENITOURINARY: No dysuria, hematuria.  ENDOCRINE: No polyuria, nocturia,  HEMATOLOGY: No anemia, easy bruising or bleeding SKIN: No rash or lesion. MUSCULOSKELETAL: positive for  back pain. NEUROLOGIC: No tingling, numbness positive for fainting 2 weeks ago.Marland Kitchen  PSYCHIATRY: No anxiety or depression.   MEDICATIONS AT HOME:   Prior to Admission medications   Medication Sig Start Date End Date Taking? Authorizing Provider  abiraterone Acetate (ZYTIGA) 250 MG tablet Take 4 tablets (1,000 mg total) by mouth daily. Take on an empty stomach 1 hour before or 2 hours after a meal 03/12/17   Sindy Guadeloupe, MD  aspirin EC 325 MG tablet Take 325 mg by mouth every morning.    [provider]  atorvastatin (LIPITOR) 40 MG tablet Take 1 tablet (40 mg total) by mouth daily. 03/24/16   Bettey Costa, MD  cloNIDine (CATAPRES) 0.2 MG tablet Take 1 tablet (0.2 mg  total) by mouth 2 (two) times daily. 01/19/17   Gladstone Lighter, MD  DULoxetine (CYMBALTA) 30 MG capsule  11/20/17   [provider]  ferrous sulfate 325 (65 FE) MG EC tablet Take 1 tablet (325 mg total) by mouth 2 (two) times daily with a meal. 07/28/16   Sindy Guadeloupe, MD  finasteride (PROSCAR) 5 MG tablet Take 5 mg by mouth daily.    [provider]  Fluticasone-Salmeterol (ADVAIR) 250-50 MCG/DOSE AEPB Inhale 1 puff into the lungs 2 (two) times daily.    [provider]  GLIPIZIDE XL 10 MG 24 hr tablet Take 10 mg by mouth 2 (two) times daily.  04/26/17   [provider]  ketoconazole (NIZORAL) 2 % cream Apply 1 application topically daily as needed.  04/03/17   [provider]  losartan (COZAAR) 25 MG tablet Take 25 mg by mouth daily. 05/23/17   [provider]  metFORMIN (GLUCOPHAGE) 500 MG tablet Take 500 mg by mouth 2 (two) times daily with a meal.    [provider]  metoprolol (LOPRESSOR) 50 MG tablet Take 50 mg by mouth 2 (two) times daily.    [provider]  OLANZapine (ZYPREXA) 10 MG tablet Take 1 tablet (10 mg total) by mouth at bedtime. 12/28/17   Sindy Guadeloupe, MD  ondansetron (ZOFRAN) 4 MG tablet Take 1 tablet (4 mg total) by mouth every 8 (eight) hours as needed for nausea or vomiting. 12/28/17   Sindy Guadeloupe, MD  tamsulosin (FLOMAX) 0.4 MG CAPS capsule Take 1 capsule (0.4 mg total) by mouth daily. 05/29/16   [name], MD  vitamin B-12 (CYANOCOBALAMIN) 1000 MCG tablet Take 1 tablet (1,000 mcg total) by mouth daily. 07/28/16   Sindy Guadeloupe, MD      VITAL SIGNS:  Temperature 97.2.  Blood pressure 144/72.  Pulse 125.  Respirations 18.  PHYSICAL EXAMINATION:  GENERAL:  72 y.o.-year-old patient lying in the bed with no acute distress.  EYES: Pupils equal, round, reactive to light and accommodation. No scleral icterus. Extraocular muscles intact. conjunctiva pale.  HEENT: Head atraumatic,  normocephalic. Oropharynx and nasopharynx clear.  NECK:  Supple, no jugular venous distention. No thyroid enlargement, no tenderness.  LUNGS: Normal breath sounds bilaterally, no wheezing, rales,rhonchi or crepitation. No use of accessory muscles of respiration.  CARDIOVASCULAR: S1, S2  tachycardia. No murmurs, rubs, or gallops.  ABDOMEN: Soft, nontender, nondistended. Bowel sounds present. No organomegaly or mass.  EXTREMITIES:  2+ pedal edema.  no cyanosis, or clubbing.  NEUROLOGIC: Cranial nerves II through XII are intact. Muscle strength 5/5 in all extremities. Sensation intact. Gait not checked.  PSYCHIATRIC: The patient is alert and oriented x 3.  SKIN: No rash, lesion, or ulcer.  LABORATORY PANEL:   CBC Recent Labs  Lab 12/28/17 1437  WBC 5.1  HGB 5.0*  HCT 15.1*  PLT 104*   ------------------------------------------------------------------------------------------------------------------  Chemistries  Recent Labs  Lab 12/28/17 1437  NA 138  K 3.6  CL 106  CO2 21*  GLUCOSE 103*  BUN 32*  CREATININE 2.13*  CALCIUM 8.2*  AST 50*  ALT 22  ALKPHOS 245*  BILITOT 1.2   ------------------------------------------------------------------------------------------------------------------     EKG:   Ordered by me  IMPRESSION AND PLAN:   1.  Severe anemia.  Patient deferred GI work-up at this point.  Send off B12 and ferritin and can see if he wants to do GI work-up in the future.  Stop aspirin.  Transfuse 2 units of packed red blood cells.  Benefits and risks of transfusion explained to the patient.  Guaiac stool. 2.  Acute kidney injury.  Watch closely after 2 units of blood.  Check creatinine in the morning.  Hold losartan, metformin at this time. 3.  Hypertension and tachycardia continue metoprolol and clonidine 4.  Hyperlipidemia unspecified on atorvastatin  5.  BPH on Flomax and Finasteride 6.  Vitamin B12 deficiency on B12 supplementation 7.  Type 2 diabetes  mellitus hold glipizide and metformin with acute kidney injury.  Put on sliding scale insulin. 8.  Metastatic prostate cancer to the bone and likely liver.  Looks like he is going to be set up for liver biopsy as outpatient.  Patient wants to be a full code at this time. 9.  Tobacco abuse.  Smoking cessation counseling done 4 minutes by me.  PRN nicotine patch ordered   All the records are reviewed and case discussed with ED provider. Management plans discussed with the patient,  and he is in agreement.  CODE STATUS: full code  TOTAL TIME TAKING CARE OF THIS PATIENT: 55 minutes, including ACP time   Loletha Grayer M.D on 12/28/2017 at 6:15 PM  Between 7am to 6pm - Pager - [phone]  After 6pm call admission pager [phone]  [facility]  [phone]  CC: Primary care physician; [name], MD

## 2017-12-28 NOTE — Telephone Encounter (Signed)
Went to patient's house yest evening because pt does not have a phone and he missed his last appt. To get pet scan results.  I spoke to pt and he  Was agreeable to come but did not feel that he was strong enough to drive. I arranged for [facility] to bring pt and he came. He is weak, feet swollen, sob on exertion, dizzy at times. He will be seen in clinic today

## 2017-12-28 NOTE — Progress Notes (Signed)
Patient ID: Michael Mathews, male   DOB: 07-Feb-1946, 72 y.o.   MRN: [id number]  ACP note  Patient present  Diagnosis: Severe anemia, metastatic prostate cancer, acute kidney injury, hypertension, hyperlipidemia, BPH, B12 deficiency, type 2 diabetes mellitus.  CODE STATUS discussed.  Patient wishes to be a full code at this point time. Plan.  Transfuse 2 units of packed red blood cells.  Monitor hemoglobin closely.  Guaiac stools.  Patient does not want a GI work-up right at this point.  Monitor kidney function closely.  Time spent on ACP discussion 17 minutes Dr. Loletha Grayer

## 2017-12-28 NOTE — Telephone Encounter (Signed)
Called report to [facility] to give report. He is prostate cancer with mets to bones. Got last xofigo 6/15. He has positive scan with multiple liver spots. Pt set up for bx of liver 7/26. Pt hgb 5 and needs blood transfusion. Creat elevated 2.1. He is sob on exertion, fatigued, feet swollen. Pt has not been eating and drinking very much. He states when he drinks water he feels like he is going to vomit so he only takes a sip. He feel at his house last week and once on the porch outside. He has walker at home that he has been using. Also he was so weak he could not drive himself to the cancer center so I arranged to have [facility] him over here and I did tell Freddie Breech that he will need a cab ride and hospital needs to pay for it because he does not have money. He told me today he had 8 dollars in his pocket. He lives alone and does not have phone to call anywhere. He was given 1 liter of fluids and 4 mg zofran and 10 mg decadron at cancer center before coming to be admitted

## 2017-12-28 NOTE — Progress Notes (Signed)
Hematology/Oncology Consult note [facility]  [phone] [phone]  Patient Care Team: [name], MD as PCP - General (Family Medicine)   Name of the patient: Michael Mathews  [id number]  1946-02-01   Date of visit: 12/28/17  Diagnosis- 1. 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 Liver metastases  Chief complaint/ Reason for visit- discuss PET/Ct results  Heme/Onc history: 1. patient is 72 year old male with a past medical history significant for hypertension diabetes and COPD among other medical problems. He was admitted to the hospital in October 2017 with some symptoms of dizziness and abdominal pain. CT angiogram abdomen incidentally showed sclerotic metastatic disease throughout the visualized spine.   2. This was followed by an MRI of the lumbar spine which showed multifocal T1 and T2 hypointense lesions with areas of increased signal on STIR sequence are seen in multiple vertebral bodies. Largest lesions are in T11 T12 L1 and in the imaged sacrum. This is consistent with metastatic disease. Primary lesion is not identified. MRI brain showed no acute intracranial abnormality.   3. Patient was noted to have an elevated PSA and was referred to urology as an outpatient. He was recently seen by Dr. Tresa Moore on 05/29/2016. PSA was repeated at that time which came back elevated at 10.3. 3 months over the value was 10.81.  4. Bone scan on 07/07/16 showed : Widespread radiotracer uptake over the axial and appendicular skeleton as described compatible with metastatic disease and correlating with sclerotic lesions on Ct. CT chest on 07/07/16 showed: Scattered sclerotic osseous metastatic lesions throughout spine, pelvis, proximal femora, ribs, and manubrium. Prostatic enlargement with thickened bladder wall question related to chronic bladder outlet  obstruction.  Spiculated 12 x 11 x 7 mm LEFT upper lobe nodule question primary pulmonary neoplasm. Stable nonspecific small LEFT adrenal nodule. Single minimally enlarged AP window lymph node. BILATERAL inguinal hernias containing fat. 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 Recommend annual imaging followup by CTA or MRA.   5. Anemia work up from 06/20/16 was as follows: CBC showed white count of 5.2, H&H of 8.1/25 and a platelet count of 164. CMP showed elevated alkaline phosphatase of 391. Multiple myeloma panel showed normal quantitative immunoglobulins and no monoclonal M protein.Vitamin B12 level was low normal at 225. LDH was mildly elevated at 234. Ferritin was low normal at 27. Reticulocyte count was 2.4% inappropriately low for the degree of anemia. Haptoglobin was elevated at 273.  6. Patients case was discussed at tumor board and plan was to watch the lung nodule with scans as it was in a difficult area to biopsy. CT guided bone biopsy was obtained which showed metastatic prostate cancer for which he is on lupron  7. Given that he does not have visceral or lymph node metastasis, docetaxel/zytigawas not started. Also patient lives alone and does not have a good social support or means of communication. Those options to be consideredat progression.   8. Bone scan on 12/22/2016 showed significantly worsening osseous metastatic disease. Left upper lobe nodule was slightly smaller in size. Zytiga was added in setting of worsening bone disease along with prednisonein aug 2018. Lupron administered 02/13/2017.  9. Scans after 3 months of zytiga showed progression of bone mets. PSA remains under control. Case discussed at tumor board and plan was to hold zytiga and proceed with [facility] for 6 months followed  by repeat scans. xofigo started in jan 2019. Patient received 6 doses of xofigo.  Last dose on 11/21/17  10. He was then found to have 2 new skin lesions- one showed SCC. 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 PET/CT scan on 12/11/17 showed: 1. Examination is positive for extensive FDG avid, hypermetabolic liver metastases. 2. Widespread hypermetabolic sclerotic bone metastases. 3. Increased uptake within soft tissue in the prostate bed compatible with residual/recurrent hypermetabolic local tumor. 4. Hypermetabolic left-sided mediastinal and right hilar lymph nodes. Suspicious for nodal metastasis. 5. Aortic atherosclerosis and coronary artery atherosclerotic calcifications. Aortic Atherosclerosis (ICD10-I70.0).  Interval history- Patient was suposed to see me after his pet scan for blood work but missed his appointment last week. Overall he has been doing poorly. He lives alone and has barely been eating. Last 2 days he has only eaten some watermelon. Reports having nausea and no appetite. Feels fatigued. He has fallen twice at home. He has not been able to go for grocery shopping on his own over last 1 week. Reports leg swelling over last few days. Reports pain in his lower back and feet. Does not wish to take any pain medications. Denies any blood in stool or urine  ECOG PS- 2-3 Pain scale- 5 Opioid associated constipation- no  Review of systems- Review of Systems  Constitutional: Positive for malaise/fatigue and weight loss. Negative for chills and fever.       Lack of appetite  HENT: Negative for congestion, ear discharge and nosebleeds.   Eyes: Negative for blurred vision.  Respiratory: Negative for cough, hemoptysis, sputum production, shortness of breath and wheezing.   Cardiovascular: Negative for chest pain, palpitations, orthopnea and claudication.  Gastrointestinal: Positive for nausea. Negative for abdominal pain, blood in stool, constipation, diarrhea, heartburn, melena and vomiting.    Genitourinary: Negative for dysuria, flank pain, frequency, hematuria and urgency.  Musculoskeletal: Negative for back pain, joint pain and myalgias.  Skin: Negative for rash.  Neurological: Negative for dizziness, tingling, focal weakness, seizures, weakness and headaches.  Endo/Heme/Allergies: Does not bruise/bleed easily.  Psychiatric/Behavioral: Negative for depression and suicidal ideas. The patient does not have insomnia.        No Known Allergies   Past Medical History:  Diagnosis Date  . Anemia   . Arthritis   . Cancer [facility])    prostate  . COPD (chronic obstructive pulmonary disease) ([place])   . Coronary artery disease   . Cough    chronic  . Diabetes mellitus without complication ([place])   . Edema   . Elevated PSA   . Headache   . Hypertension   . Orthopnea   . Oxygen deficit    o2 prn  . Prostate cancer ([place])   . Shortness of breath dyspnea   . Sleep apnea    cpap     Past Surgical History:  Procedure Laterality Date  . CARDIAC CATHETERIZATION  2014  . COLONOSCOPY    . CYSTOSCOPY W/ RETROGRADES Bilateral 10/18/2016   Procedure: CYSTOSCOPY WITH RETROGRADE PYELOGRAM;  Surgeon: Hollice Espy, MD;  Location: [facility] ORS;  Service: Urology;  Laterality: Bilateral;  . EYE SURGERY    . KNEE ARTHROSCOPY    . TRANSURETHRAL RESECTION OF BLADDER TUMOR N/A 10/18/2016   Procedure: TRANSURETHRAL RESECTION OF BLADDER TUMOR (TURBT);  Surgeon: Hollice Espy, MD;  Location: [facility] ORS;  Service: Urology;  Laterality: N/A;  . TRANSURETHRAL RESECTION OF PROSTATE N/A 10/18/2016   Procedure: TRANSURETHRAL RESECTION OF THE  PROSTATE (TURP) CHANNEL TURP;  Surgeon: Hollice Espy, MD;  Location: [facility] ORS;  Service: Urology;  Laterality: N/A;    Social History   Socioeconomic History  . Marital status: Single    Spouse name: Not on file  . Number of children: Not on file  . Years of education: Not on file  . Highest education level: Not on file  Occupational History  . Not on file   Social Needs  . Financial resource strain: Not on file  . Food insecurity:    Worry: Not on file    Inability: Not on file  . Transportation needs:    Medical: Not on file    Non-medical: Not on file  Tobacco Use  . Smoking status: Current Every Day Smoker    Packs/day: 0.50    Years: 55.00    Pack years: 27.50    Types: Cigarettes  . Smokeless tobacco: Never Used  Substance and Sexual Activity  . Alcohol use: No  . Drug use: No  . Sexual activity: Not on file  Lifestyle  . Physical activity:    Days per week: Not on file    Minutes per session: Not on file  . Stress: Not on file  Relationships  . Social connections:    Talks on phone: Not on file    Gets together: Not on file    Attends religious service: Not on file    Active member of club or organization: Not on file    Attends meetings of clubs or organizations: Not on file    Relationship status: Not on file  . Intimate partner violence:    Fear of current or ex partner: Not on file    Emotionally abused: Not on file    Physically abused: Not on file    Forced sexual activity: Not on file  Other Topics Concern  . Not on file  Social History Narrative  . Not on file    Family History  Problem Relation Age of Onset  . CVA Mother   . CAD Father      Current Outpatient Medications:  .  aspirin EC 325 MG tablet, Take 325 mg by mouth every morning., Disp: , Rfl:  .  atorvastatin (LIPITOR) 40 MG tablet, Take 1 tablet (40 mg total) by mouth daily., Disp: 30 tablet, Rfl: 0 .  cloNIDine (CATAPRES) 0.2 MG tablet, Take 1 tablet (0.2 mg total) by mouth 2 (two) times daily., Disp: 60 tablet, Rfl: 2 .  DULoxetine (CYMBALTA) 30 MG capsule, , Disp: , Rfl:  .  ferrous sulfate 325 (65 FE) MG EC tablet, Take 1 tablet (325 mg total) by mouth 2 (two) times daily with a meal., Disp: 60 tablet, Rfl: 3 .  finasteride (PROSCAR) 5 MG tablet, Take 5 mg by mouth daily., Disp: , Rfl:  .  Fluticasone-Salmeterol (ADVAIR) 250-50  MCG/DOSE AEPB, Inhale 1 puff into the lungs 2 (two) times daily., Disp: , Rfl:  .  GLIPIZIDE XL 10 MG 24 hr tablet, Take 10 mg by mouth 2 (two) times daily. , Disp: , Rfl:  .  ketoconazole (NIZORAL) 2 % cream, Apply 1 application topically daily as needed. , Disp: , Rfl:  .  losartan (COZAAR) 25 MG tablet, Take 25 mg by mouth daily., Disp: , Rfl:  .  metFORMIN (GLUCOPHAGE) 500 MG tablet, Take 500 mg by mouth 2 (two) times daily with a meal., Disp: , Rfl:  .  metoprolol (LOPRESSOR) 50 MG tablet, Take 50 mg  by mouth 2 (two) times daily., Disp: , Rfl:  .  tamsulosin (FLOMAX) 0.4 MG CAPS capsule, Take 1 capsule (0.4 mg total) by mouth daily., Disp: 90 capsule, Rfl: 3 .  vitamin B-12 (CYANOCOBALAMIN) 1000 MCG tablet, Take 1 tablet (1,000 mcg total) by mouth daily., Disp: 30 tablet, Rfl: 3 .  abiraterone Acetate (ZYTIGA) 250 MG tablet, Take 4 tablets (1,000 mg total) by mouth daily. Take on an empty stomach 1 hour before or 2 hours after a meal, Disp: 120 tablet, Rfl: 3 .  OLANZapine (ZYPREXA) 10 MG tablet, Take 1 tablet (10 mg total) by mouth at bedtime., Disp: 30 tablet, Rfl: 0 .  ondansetron (ZOFRAN) 4 MG tablet, Take 1 tablet (4 mg total) by mouth every 8 (eight) hours as needed for nausea or vomiting., Disp: 20 tablet, Rfl: 0  Physical exam:  Vitals:   12/28/17 1340  BP: (!) 144/72  Pulse: (!) 125  Resp: 18  Temp: (!) 97.2 F (36.2 C)  TempSrc: Tympanic  Weight: 225 lb 4.8 oz (102.2 kg)  Height: _0  (1.778 m)   Physical Exam  Constitutional: He is oriented to person, place, and time.  He appears fatigued and pale. Sitting in a wheelchair. Pursed lip breathing  HENT:  Head: Normocephalic and atraumatic.  Eyes: Pupils are equal, round, and reactive to light. EOM are normal.  Neck: Normal range of motion.  Cardiovascular: Normal rate, regular rhythm and normal heart sounds.  Pulmonary/Chest: Effort normal and breath sounds normal.  Abdominal: Soft. Bowel sounds are normal.    Musculoskeletal: He exhibits edema (b/l ankle edema).  Neurological: He is alert and oriented to person, place, and time.  Skin: Skin is warm and dry.     CMP Latest Ref Rng & Units 12/28/2017  Glucose 70 - 99 mg/dL 103(H)  BUN 8 - 23 mg/dL 32(H)  Creatinine 0.61 - 1.24 mg/dL 2.13(H)  Sodium 135 - 145 mmol/L 138  Potassium 3.5 - 5.1 mmol/L 3.6  Chloride 98 - 111 mmol/L 106  CO2 22 - 32 mmol/L 21(L)  Calcium 8.9 - 10.3 mg/dL 8.2(L)  Total Protein 6.5 - 8.1 g/dL 7.3  Total Bilirubin 0.3 - 1.2 mg/dL 1.2  Alkaline Phos 38 - 126 U/L 245(H)  AST 15 - 41 U/L 50(H)  ALT 0 - 44 U/L 22   CBC Latest Ref Rng & Units 12/28/2017  WBC 3.8 - 10.6 K/uL 5.1  Hemoglobin 13.0 - 18.0 g/dL 5.0(L)  Hematocrit 40.0 - 52.0 % 15.1(L)  Platelets 150 - 440 K/uL 104(L)    No images are attached to the encounter.  Nm Pet Image Initial (pi) Skull Base To Thigh  Result Date: 12/11/2017 CLINICAL DATA:  Subsequent treatment strategy for metastatic prostate cancer. EXAM: NUCLEAR MEDICINE PET SKULL BASE TO THIGH TECHNIQUE: 11.9 mCi F-18 FDG was injected intravenously. Full-ring PET imaging was performed from the skull base to thigh after the radiotracer. CT data was obtained and used for attenuation correction and anatomic localization. Fasting blood glucose: 41 mg/dl COMPARISON:  Nuclear medicine bone scan 05/15/2017 and CT CAP 05/15/2017 FINDINGS: Mediastinal blood pool activity: SUV max 1.9 NECK: No hypermetabolic lymph nodes in the neck. Incidental CT findings: none CHEST: Left-sided pre-vascular lymph node measures 1.2 cm and has an SUV max of 3.8. Increased uptake within right hilar lymph node has an SUV max of 6.4. No pleural effusion identified. Two small nodules in the posterior left upper lobe are identified measuring up to 6 mm. These are too small to  characterize by PET-CT. Small nodule in the lateral right upper lobe measures 3 mm, image 68/3. Also too small to characterize by PET-CT. Calcified granuloma  noted within the superior segment of right lower lobe. Incidental CT findings: Aortic atherosclerosis. Calcifications in the LAD, left circumflex and RCA coronary artery noted. Calcified right hilar and right paratracheal lymph nodes identified. ABDOMEN/PELVIS: Extensive hypermetabolic liver metastases are identified involving both lobes of liver. Index lesion within segment 4b measures 5.4 cm and has an SUV max 28.58. Index lesion within segment 7 measures 6.9 cm and has an SUV max of 28.17. Segment 8 lesion measures 4.4 cm within SUV max of 18.47. No abnormal uptake identified within the pancreas or spleen. The adrenal glands are unremarkable. No hypermetabolic lymph nodes identified within the abdomen. No hypermetabolic pelvic or inguinal lymph nodes identified. Within the prostate gland there is intense radiotracer uptake within SUV max of 9.01. Incidental CT findings: Extensive aortic atherosclerosis with branch vessel disease identified. SKELETON: There is widespread sclerotic bone metastases. Intense, heterogeneous and patchy uptake is identified within the axial and appendicular skeleton compatible with hypermetabolic bone metastases. For example, sclerotic metastasis within the right humeral head have an SUV max equal to 8.8. Sclerotic metastasis within the right iliac bone have an SUV max of 6.07. Focal area of intense uptake within the right femoral neck with corresponding sclerotic metastasis has an SUV max of 5.95. Incidental CT findings: none IMPRESSION: Number 1. Examination is positive for extensive FDG avid, hypermetabolic liver metastases. 2. Widespread hypermetabolic sclerotic bone metastases. 3. Increased uptake within soft tissue in the prostate bed compatible with residual/recurrent hypermetabolic local tumor. 4. Hypermetabolic left-sided mediastinal and right hilar lymph nodes. Suspicious for nodal metastasis. 5. Aortic atherosclerosis and coronary artery atherosclerotic calcifications. Aortic  Atherosclerosis (ICD10-I70.0). Electronically Signed   By: Kerby Moors M.D.   On: 12/11/2017 15:28     Assessment and plan- Patient is a 72 y.o. male with metastatic prostate cancer and bone mets. New skin cancer- neuroendocrine (primary versus metastatses) now with new liver mets, hilar adenopathy, worsening bone mets and severe anemia  1. Normocytic anemia- no overt GI bleed per history. He does not have microcytosis either. This could be due to [facility] and ongoing malignancy as well. He needs to be admitted to the hospital to receive PRBC transfusion to keep hb >7. Check iron studies, b12 and folate upon admission prior to transfusion  2. AKI- likely due to dehydration and poor oral intake. He did get IV fluids at the cancer center. He will likely need more inpatient  3. Liver metastases- I have reviewed pet/ ct images independently and discussed findings with him. He has diffuse liver mets, hilar mets and bone mets. There is some concern for prostate ca recurrence at the prostate bed as well. His tumor is poorly differentiated and PSA is not reflective of his disease status. He was also found to have second skin cancer with neuroendocrine features. Unclear if the recent findings on Pet/CT are due to prostate cancer (which was biopsy proven adenoca not neuroendocrine) versus metastatic neuroendocrine carcinoma. He needs a liver biopsy for further diagnosis.  4. Presently his performance status is poor. He lives alone, does not have a cell phone and only has a neighbor who checks on him occasionally. Discussed pursuing biopsy and exploring further treatment options versus best supportive care/ hospice. Patient would like to proceed with biopsy first. Recommend palliative care consult to assist with goals of care as well. If he gets discharged  over the weekend, I will f/u with him next week for possible transfusion and arrange outpatient liver biopsy   Visit Diagnosis 1. Prostate cancer metastatic  to bone ([place])   2. Anemia, unspecified type   3. Liver metastases ([place])   4. AKI (acute kidney injury) ([place])      Dr. Randa Evens, MD, MPH [facility] [phone] 12/28/2017 3:52 PM

## 2017-12-29 ENCOUNTER — Inpatient Hospital Stay: Payer: Medicare Other

## 2017-12-29 LAB — VITAMIN B12: VITAMIN B 12: 1755 pg/mL — AB (ref 180–914)

## 2017-12-29 LAB — URINALYSIS, COMPLETE (UACMP) WITH MICROSCOPIC
Bacteria, UA: NONE SEEN
Bilirubin Urine: NEGATIVE
GLUCOSE, UA: NEGATIVE mg/dL
HGB URINE DIPSTICK: NEGATIVE
Ketones, ur: NEGATIVE mg/dL
LEUKOCYTES UA: NEGATIVE
NITRITE: NEGATIVE
PH: 5 (ref 5.0–8.0)
Protein, ur: NEGATIVE mg/dL
Specific Gravity, Urine: 1.005 (ref 1.005–1.030)

## 2017-12-29 LAB — GLUCOSE, CAPILLARY
GLUCOSE-CAPILLARY: 118 mg/dL — AB (ref 70–99)
GLUCOSE-CAPILLARY: 166 mg/dL — AB (ref 70–99)
Glucose-Capillary: 213 mg/dL — ABNORMAL HIGH (ref 70–99)
Glucose-Capillary: 145 mg/dL — ABNORMAL HIGH (ref 70–99)
Glucose-Capillary: 123 mg/dL — ABNORMAL HIGH (ref 70–99)

## 2017-12-29 LAB — FERRITIN: FERRITIN: 956 ng/mL — AB (ref 24–336)

## 2017-12-29 LAB — BASIC METABOLIC PANEL
Anion gap: 10 (ref 5–15)
BUN: 35 mg/dL — AB (ref 8–23)
CALCIUM: 8 mg/dL — AB (ref 8.9–10.3)
CO2: 24 mmol/L (ref 22–32)
CREATININE: 2.29 mg/dL — AB (ref 0.61–1.24)
Chloride: 107 mmol/L (ref 98–111)
GFR calc Af Amer: 31 mL/min — ABNORMAL LOW (ref >60)
GFR calc non Af Amer: 27 mL/min — ABNORMAL LOW (ref >60)
GLUCOSE: 146 mg/dL — AB (ref 70–99)
Potassium: 4.4 mmol/L (ref 3.5–5.1)
Sodium: 141 mmol/L (ref 135–145)

## 2017-12-29 LAB — POCT CBG MONITORING: POCT Glucose (KUC): 213 mg/dL — AB (ref 70–99)

## 2017-12-29 LAB — CBC
HCT: 20.4 % — ABNORMAL LOW (ref 40.0–52.0)
Hemoglobin: 7.2 g/dL — ABNORMAL LOW (ref 13.0–18.0)
MCH: 33.2 pg (ref 26.0–34.0)
MCHC: 35.1 g/dL (ref 32.0–36.0)
MCV: 94.4 fL (ref 80.0–100.0)
Platelets: 87 10*3/uL — ABNORMAL LOW (ref 150–440)
RBC: 2.17 MIL/uL — ABNORMAL LOW (ref 4.40–5.90)
RDW: 18.8 % — AB (ref 11.5–14.5)
WBC: 5.8 10*3/uL (ref 3.8–10.6)

## 2017-12-29 LAB — PREPARE RBC (CROSSMATCH)

## 2017-12-29 LAB — HEMOGLOBIN A1C
Hgb A1c MFr Bld: 5.4 % (ref 4.8–5.6)
MEAN PLASMA GLUCOSE: 108.28 mg/dL

## 2017-12-29 MED ORDER — FUROSEMIDE 10 MG/ML IJ SOLN
40.0000 mg | Freq: Once | INTRAMUSCULAR | Status: AC
  Administered 2017-12-29: 40 mg via INTRAVENOUS
  Filled 2017-12-29: qty 4

## 2017-12-29 MED ORDER — SODIUM CHLORIDE 0.9 % IV SOLN
INTRAVENOUS | Status: DC
  Administered 2017-12-29: 15:00:00 via INTRAVENOUS

## 2017-12-29 MED ORDER — PANTOPRAZOLE SODIUM 40 MG PO TBEC: 40.0000 mg | DELAYED_RELEASE_TABLET | Freq: Every day | ORAL | 0 refills | Status: DC

## 2017-12-29 MED ORDER — NICOTINE 14 MG/24HR TD PT24: 14.0000 mg | MEDICATED_PATCH | Freq: Every day | TRANSDERMAL | 0 refills | Status: DC | PRN

## 2017-12-29 MED ORDER — AMIODARONE HCL 200 MG PO TABS
400.0000 mg | ORAL_TABLET | Freq: Every day | ORAL | Status: DC
  Administered 2017-12-29 – 2017-12-30 (×2): 400 mg via ORAL
  Filled 2017-12-29 (×2): qty 2

## 2017-12-29 MED ORDER — ONDANSETRON HCL 4 MG/2ML IJ SOLN: 4.0000 mg | Freq: Four times a day (QID) | INTRAMUSCULAR | Status: DC | PRN

## 2017-12-29 MED ORDER — SODIUM CHLORIDE 0.9% IV SOLUTION
Freq: Once | INTRAVENOUS | Status: AC
  Administered 2017-12-29: 11:00:00 via INTRAVENOUS

## 2017-12-29 MED ORDER — CLONIDINE HCL 0.1 MG PO TABS
0.1000 mg | ORAL_TABLET | Freq: Two times a day (BID) | ORAL | Status: DC
  Administered 2017-12-29 – 2017-12-30 (×2): 0.1 mg via ORAL
  Filled 2017-12-29 (×2): qty 1

## 2017-12-29 MED ORDER — FAMOTIDINE IN NACL 20-0.9 MG/50ML-% IV SOLN
20.0000 mg | Freq: Two times a day (BID) | INTRAVENOUS | Status: DC
  Administered 2017-12-29 – 2017-12-30 (×2): 20 mg via INTRAVENOUS
  Filled 2017-12-29 (×2): qty 50

## 2017-12-29 MED ORDER — ACETAMINOPHEN 325 MG PO TABS
650.0000 mg | ORAL_TABLET | Freq: Once | ORAL | Status: AC
  Administered 2017-12-29: 11:00:00 650 mg via ORAL
  Filled 2017-12-29: qty 2

## 2017-12-29 NOTE — Progress Notes (Signed)
PT Cancellation Note  Patient Details Name: Michael Mathews MRN: [id number] DOB: 03-21-1946   Cancelled Treatment:    Reason Eval/Treat Not Completed: Medical issues which prohibited therapy.  Despite transfusion is very anemic with high BP and medically more fragile.  Check later as time and pt allow.   Ramond Dial 12/29/2017, 8:44 AM   Mee Hives, PT MS Acute Rehab Dept. Number: [place] [facility] [place]

## 2017-12-29 NOTE — Progress Notes (Signed)
Patient ID: Michael Mathews, male   DOB: [birth year], 72 y.o.   MRN: [id number]  [name] XBJ:[id number] DOB: January 06, 1946 DOA: 12/28/2017 PCP: Michael Coffin, MD  HPI/Subjective: Patient this morning stated he wanted to go home after receiving another unit of blood.  He was unable to eat either breakfast or lunch today.  I advised him that if he does not eat he will not go home.  Patient feeling better than yesterday.  Objective: Vitals:   12/29/17 1204 12/29/17 1319  BP: (!) 144/80 (!) 143/82  Pulse: 97 95  Resp: 18 20  Temp: 97.8 F (36.6 C) 97.7 F (36.5 C)  SpO2: 94% 95%    Filed Weights   12/28/17 2041  Weight: 99.6 kg (219 lb 8 oz)    ROS: Review of Systems  Constitutional: Negative for chills and fever.  Eyes: Negative for blurred vision.  Respiratory: Negative for cough and shortness of breath.   Cardiovascular: Negative for chest pain.  Gastrointestinal: Negative for abdominal pain, constipation, diarrhea, nausea and vomiting.  Genitourinary: Negative for dysuria.  Musculoskeletal: Negative for joint pain.  Neurological: Negative for dizziness and headaches.   Exam: Physical Exam  Constitutional: He is oriented to person, place, and time.  HENT:  Nose: No mucosal edema.  Mouth/Throat: No oropharyngeal exudate or posterior oropharyngeal edema.  Eyes: Pupils are equal, round, and reactive to light. Conjunctivae, EOM and lids are normal.  Neck: No JVD present. Carotid bruit is not present. No edema present. No thyroid mass and no thyromegaly present.  Cardiovascular: S1 normal and S2 normal. An irregularly irregular rhythm present. Exam reveals no gallop.  No murmur heard. Pulses:      Dorsalis pedis pulses are 2+ on the right side, and 2+ on the left side.  Respiratory: No respiratory distress. He has no wheezes. He has no rhonchi. He has no rales.  GI: Soft. Bowel sounds are normal. There is no tenderness.  Musculoskeletal:     Right ankle: He exhibits swelling.       Left ankle: He exhibits swelling.  Lymphadenopathy:    He has no cervical adenopathy.  Neurological: He is alert and oriented to person, place, and time. No cranial nerve deficit.  Skin: Skin is warm. No rash noted. Nails show no clubbing.  Psychiatric: He has a normal mood and affect.      Data Reviewed: Basic Metabolic Panel: Recent Labs  Lab 12/28/17 1437 12/29/17 0638  NA 138 141  K 3.6 4.4  CL 106 107  CO2 21* 24  GLUCOSE 103* 146*  BUN 32* 35*  CREATININE 2.13* 2.29*  CALCIUM 8.2* 8.0*   Liver Function Tests: Recent Labs  Lab 12/28/17 1437  AST 50*  ALT 22  ALKPHOS 245*  BILITOT 1.2  PROT 7.3  ALBUMIN 2.8*   CBC: Recent Labs  Lab 12/28/17 1437 12/29/17 0638  WBC 5.1 5.8  NEUTROABS 4.0  --   HGB 5.0* 7.2*  HCT 15.1* 20.4*  MCV 98.2 94.4  PLT 104* 87*   CBG: Recent Labs  Lab 12/29/17 0139 12/29/17 0741 12/29/17 1131  GLUCAP 213* 145* 123*    Scheduled Meds: . atorvastatin  40 mg Oral q1800  . cloNIDine  0.2 mg Oral BID  . DULoxetine  30 mg Oral Daily  . ferrous sulfate  325 mg Oral BID WC  . finasteride  5 mg Oral Daily  . insulin aspart  0-5 Units Subcutaneous QHS  . insulin  aspart  0-9 Units Subcutaneous TID WC  . metoprolol tartrate  50 mg Oral BID  . mometasone-formoterol  2 puff Inhalation BID  . OLANZapine  10 mg Oral QHS  . pantoprazole  40 mg Oral Daily  . tamsulosin  0.4 mg Oral Daily  . vitamin B-12  1,000 mcg Oral Daily   Continuous Infusions: . sodium chloride    . famotidine (PEPCID) IV      Assessment/Plan:  1. Severe anemia of chronic disease.  Patient will receive his third unit of blood today.  Patient again deferred GI work-up.  Patient was unable to eat breakfast or lunch.  If unable to eat dinner tonight, I may have to get GI consultation. 2. Acute kidney injury.  Creatinine about the same today.  Postvoid bladder scan did not show any retention.  Gentle IV fluids  ordered after blood transfusion.  On finasteride and Flomax for BPH.  Get a renal sonogram. 3. Atrial fibrillation with areas of rapid ventricular response.  Patient already on metoprolol.  And amiodarone.  Anticoagulation contraindicated with severe anemia. 4. Essential hypertension.  Decrease clonidine dose since starting amiodarone. 5. Vitamin B12 deficiency on oral supplementation.  B12 level still pending 6. BPH on finasteride and Flomax 7. Type 2 diabetes mellitus on sliding scale only.  Glipizide and metformin contraindicated with acute kidney injury. 8. Tobacco abuse.  PRN nicotine patch 9. Metastatic prostate cancer to bone and liver.  Looks like liver biopsy to be set up as outpatient.  Full code at this time.  Code Status:     Code Status Orders  (From admission, onward)        Start     Ordered   12/28/17 1741  Full code  Continuous     12/28/17 1740    Code Status History    Date Active Date Inactive Code Status Order ID Comments User Context   01/19/2017 0615 01/19/2017 1954 Full Code [id number]  Harrie Foreman, MD ED   10/18/2016 1340 10/19/2016 1648 Full Code [id number]  Hollice Espy, MD Inpatient   03/23/2016 2017 03/24/2016 1501 DNR [id number]  Loletha Grayer, MD ED   03/11/2016 1341 03/14/2016 1606 DNR [id number]  Bettey Costa, MD ED   03/03/2016 1536 03/04/2016 0706 DNR [id number]  Bettey Costa, MD Inpatient     Family Communication: Patient refused Disposition Plan: To be determined  Time spent: 35 minutes  [place]

## 2017-12-30 LAB — CBC
HEMATOCRIT: 22.6 % — AB (ref 40.0–52.0)
HEMOGLOBIN: 8 g/dL — AB (ref 13.0–18.0)
MCH: 33.2 pg (ref 26.0–34.0)
MCHC: 35.3 g/dL (ref 32.0–36.0)
MCV: 94 fL (ref 80.0–100.0)
Platelets: 83 10*3/uL — ABNORMAL LOW (ref 150–440)
RBC: 2.41 MIL/uL — AB (ref 4.40–5.90)
RDW: 17.9 % — ABNORMAL HIGH (ref 11.5–14.5)
WBC: 5.6 10*3/uL (ref 3.8–10.6)

## 2017-12-30 LAB — BASIC METABOLIC PANEL
ANION GAP: 6 (ref 5–15)
BUN: 33 mg/dL — ABNORMAL HIGH (ref 8–23)
CHLORIDE: 110 mmol/L (ref 98–111)
CO2: 26 mmol/L (ref 22–32)
Calcium: 7.8 mg/dL — ABNORMAL LOW (ref 8.9–10.3)
Creatinine, Ser: 1.68 mg/dL — ABNORMAL HIGH (ref 0.61–1.24)
GFR calc Af Amer: 45 mL/min — ABNORMAL LOW (ref >60)
GFR calc non Af Amer: 39 mL/min — ABNORMAL LOW (ref >60)
GLUCOSE: 103 mg/dL — AB (ref 70–99)
POTASSIUM: 3.7 mmol/L (ref 3.5–5.1)
Sodium: 142 mmol/L (ref 135–145)

## 2017-12-30 LAB — BPAM RBC
BLOOD PRODUCT EXPIRATION DATE: 201908092359
BLOOD PRODUCT EXPIRATION DATE: 201908092359
Blood Product Expiration Date: 201908122359
ISSUE DATE / TIME: 201907200133
ISSUE DATE / TIME: 201907192105
ISSUE DATE / TIME: 201907201140
Unit Type and Rh: 6200
Unit Type and Rh: 6200
Unit Type and Rh: 6200

## 2017-12-30 LAB — TYPE AND SCREEN
ABO/RH(D): A POS
Antibody Screen: NEGATIVE
Unit division: 0
Unit division: 0
Unit division: 0

## 2017-12-30 LAB — GLUCOSE, CAPILLARY
Glucose-Capillary: 105 mg/dL — ABNORMAL HIGH (ref 70–99)
Glucose-Capillary: 95 mg/dL (ref 70–99)

## 2017-12-30 MED ORDER — NICOTINE 14 MG/24HR TD PT24: 14.0000 mg | MEDICATED_PATCH | Freq: Every day | TRANSDERMAL | 0 refills | Status: DC | PRN

## 2017-12-30 MED ORDER — AMIODARONE HCL 200 MG PO TABS: ORAL_TABLET | ORAL | 0 refills | Status: DC

## 2017-12-30 MED ORDER — PANTOPRAZOLE SODIUM 40 MG PO TBEC: 40.0000 mg | DELAYED_RELEASE_TABLET | Freq: Every day | ORAL | 0 refills | Status: DC

## 2017-12-30 NOTE — Discharge Summary (Signed)
[facility] [place] NAME: Michael Mathews    MR#:  [id number]  DATE OF BIRTH:  1945-06-24  DATE OF ADMISSION:  12/28/2017 ADMITTING PHYSICIAN: Loletha Grayer, MD  DATE OF DISCHARGE: 12/30/2017  PRIMARY CARE PHYSICIAN: [name], MD    ADMISSION DIAGNOSIS:  Anemia AKI Metastatic Prostate Cancer  DISCHARGE DIAGNOSIS:  Active Problems:   Anemia   SECONDARY DIAGNOSIS:   Past Medical History:  Diagnosis Date  . Anemia   . Arthritis   . Cancer [facility])    prostate  . COPD (chronic obstructive pulmonary disease) ([place])   . Coronary artery disease   . Cough    chronic  . Diabetes mellitus without complication ([place])   . Edema   . Elevated PSA   . Headache   . Hypertension   . Orthopnea   . Oxygen deficit    o2 prn  . Prostate cancer ([place])   . Shortness of breath dyspnea   . Sleep apnea    cpap    HOSPITAL COURSE:   1.  Severe anemia of chronic disease.  Patient  sent in for direct admission for hemoglobin of 5.  The patient received 3 units of packed red blood cells during the hospital course.  Patient deferred a GI work-up at this time.  Patient is not a big eater and only eats one meal a day.  I advised him he must eat and drink in order to survive.  Stopped aspirin.  Prescribed Protonix. 2.  Acute kidney injury.  Creatinine improved with IV fluid hydration.  Creatinine 2.13 on admission.  Improved to 1.68 upon discharge.   3.   atrial fibrillation with rapid ventricular response.  Patient already on metoprolol.  Heart rate better with starting of amiodarone.  Anticoagulation contraindicated with severe anemia. 4.  Essential hypertension.  Discontinue clonidine.  I do not want 3 medications that can lower heart rate.  Continue metoprolol.  Watch blood pressure as outpatient and consider starting Norvasc if blood pressure still elevated. 5.  B12 deficiency on oral supplementation.  B12 level high. 6.  BPH on finasteride and  Flomax 7.  Type 2 diabetes mellitus.  Glipizide and metformin stopped secondary to acute kidney injury.  My concern with this patient is he may not eat and drink enough as outpatient and I do not want him to have prolonged hypoglycemia.  I stopped his medications for diabetes. 8.  Tobacco abuse.  PRN nicotine patch. 9.  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  Looks like liver biopsy set up as outpatient.  Patient wants to be a full code at this point in time.  I do not think his insight is very good about his disease process and prognosis.  Continue to follow-up with Dr. [name] oncology as outpatient.    DISCHARGE CONDITIONS:   Fair  CONSULTS OBTAINED:  None  DRUG ALLERGIES:  No Known Allergies  DISCHARGE MEDICATIONS:   Allergies as of 12/30/2017   No Known Allergies     Medication List    STOP taking these medications   aspirin EC 325 MG tablet   cloNIDine 0.2 MG tablet Commonly known as:  CATAPRES   GLIPIZIDE XL 10 MG 24 hr tablet Generic drug:  glipiZIDE   losartan 25 MG tablet Commonly known as:  COZAAR   metFORMIN 500 MG tablet Commonly known as:  GLUCOPHAGE     TAKE these medications   abiraterone acetate 250 MG tablet Commonly  known as:  ZYTIGA Take 4 tablets (1,000 mg total) by mouth daily. Take on an empty stomach 1 hour before or 2 hours after a meal   amiodarone 200 MG tablet Commonly known as:  PACERONE Two tablets daily for four days then one tablet daily   atorvastatin 40 MG tablet Commonly known as:  LIPITOR Take 1 tablet (40 mg total) by mouth daily.   DULoxetine 30 MG capsule Commonly known as:  CYMBALTA   ferrous sulfate 325 (65 FE) MG EC tablet Take 1 tablet (325 mg total) by mouth 2 (two) times daily with a meal.   finasteride 5 MG tablet Commonly known as:  PROSCAR Take 5 mg by mouth daily.   Fluticasone-Salmeterol 250-50 MCG/DOSE Aepb Commonly known as:  ADVAIR Inhale 1 puff into the lungs 2 (two) times daily.   ketoconazole  2 % cream Commonly known as:  NIZORAL Apply 1 application topically daily as needed.   metoprolol tartrate 50 MG tablet Commonly known as:  LOPRESSOR Take 50 mg by mouth 2 (two) times daily.   nicotine 14 mg/24hr patch Commonly known as:  NICODERM CQ - dosed in mg/24 hours Place 1 patch (14 mg total) onto the skin daily as needed (nicotine craving). Okay to substitute generic   OLANZapine 10 MG tablet Commonly known as:  ZYPREXA Take 1 tablet (10 mg total) by mouth at bedtime.   ondansetron 4 MG tablet Commonly known as:  ZOFRAN Take 1 tablet (4 mg total) by mouth every 8 (eight) hours as needed for nausea or vomiting.   pantoprazole 40 MG tablet Commonly known as:  PROTONIX Take 1 tablet (40 mg total) by mouth daily.   tamsulosin 0.4 MG Caps capsule Commonly known as:  FLOMAX Take 1 capsule (0.4 mg total) by mouth daily.   vitamin B-12 1000 MCG tablet Commonly known as:  CYANOCOBALAMIN Take 1 tablet (1,000 mcg total) by mouth daily.        DISCHARGE INSTRUCTIONS:   Follow-up PMD 6 days Follow-up Dr. [name] oncology as scheduled  If you experience worsening of your admission symptoms, develop shortness of breath, life threatening emergency, suicidal or homicidal thoughts you must seek medical attention immediately by calling 911 or calling your MD immediately  if symptoms less severe.  You Must read complete instructions/literature along with all the possible adverse reactions/side effects for all the Medicines you take and that have been prescribed to you. Take any new Medicines after you have completely understood and accept all the possible adverse reactions/side effects.   Please note  You were cared for by a hospitalist during your hospital stay. If you have any questions about your discharge medications or the care you received while you were in the hospital after you are discharged, you can call the unit and asked to speak with the hospitalist on call if the  hospitalist that took care of you is not available. Once you are discharged, your primary care physician will handle any further medical issues. Please note that NO REFILLS for any discharge medications will be authorized once you are discharged, as it is imperative that you return to your primary care physician (or establish a relationship with a primary care physician if you do not have one) for your aftercare needs so that they can reassess your need for medications and monitor your lab values.    Today   CHIEF COMPLAINT:  Sent in for hemoglobin of 5.0  HISTORY OF PRESENT ILLNESS:  Michael Mathews  is a  72 y.o. male with a known history of metastatic prostate cancer sent in for hemoglobin of 5.0.   VITAL SIGNS:  Blood pressure (!) 145/76, pulse 65, temperature 98.3 F (36.8 C), temperature source Oral, resp. rate 19, height 5\' 10"  (1.778 m), weight 99.6 kg (219 lb 8 oz), SpO2 97 %.    PHYSICAL EXAMINATION:  GENERAL:  72 y.o.-year-old patient lying in the bed with no acute distress.  EYES: Pupils equal, round, reactive to light and accommodation. No scleral icterus. Extraocular muscles intact.  HEENT: Head atraumatic, normocephalic. Oropharynx and nasopharynx clear.  NECK:  Supple, no jugular venous distention. No thyroid enlargement, no tenderness.  LUNGS: Normal breath sounds bilaterally, no wheezing, rales,rhonchi or crepitation. No use of accessory muscles of respiration.  CARDIOVASCULAR: S1, S2  irregularly irregular. No murmurs, rubs, or gallops.  ABDOMEN: Soft, non-tender, non-distended. Bowel sounds present. No organomegaly or mass.  EXTREMITIES:  trace pedal edema, no cyanosis, or clubbing.  NEUROLOGIC: Cranial nerves II through XII are intact. Muscle strength 5/5 in all extremities. Sensation intact. Gait not checked.  PSYCHIATRIC: The patient is alert and oriented x 3.  SKIN: No obvious rash, lesion, or ulcer.   DATA REVIEW:   CBC Recent Labs  Lab 12/30/17 0708  WBC  5.6  HGB 8.0*  HCT 22.6*  PLT 83*    Chemistries  Recent Labs  Lab 12/28/17 1437  12/30/17 0708  NA 138   < > 142  K 3.6   < > 3.7  CL 106   < > 110  CO2 21*   < > 26  GLUCOSE 103*   < > 103*  BUN 32*   < > 33*  CREATININE 2.13*   < > 1.68*  CALCIUM 8.2*   < > 7.8*  AST 50*  --   --   ALT 22  --   --   ALKPHOS 245*  --   --   BILITOT 1.2  --   --    < > = values in this interval not displayed.     RADIOLOGY:  US Renal  Result Date: 12/29/2017 CLINICAL DATA:  Acute renal insufficiency. History of prostate carcinoma EXAM: RENAL / URINARY TRACT ULTRASOUND COMPLETE COMPARISON:  PET-CT December 11, 2017 FINDINGS: Right Kidney: Length: 9.4 cm. Echogenicity and renal cortical thickness are within normal limits. No mass, perinephric fluid, or hydronephrosis visualized. There is mild scarring in the upper pole right kidney. No sonographically demonstrable calculus or ureterectasis. Left Kidney: Length: 10.1 cm. Echogenicity and renal cortical thickness are within normal limits. No perinephric fluid or hydronephrosis visualized. There is a cyst in the lower pole left kidney measuring 0.9 x 1.3 x 1.1 cm. No sonographically demonstrable calculus or ureterectasis. Bladder: Appears normal for degree of bladder distention. IMPRESSION: Slight scarring upper pole right kidney. Small cyst lower pole left kidney. Study otherwise unremarkable. In particular, no obstructing focus in either kidney. Renal cortical thickness and echogenicity are within normal limits bilaterally. Electronically Signed   By: Lowella Grip III M.D.   On: 12/29/2017 17:54     Management plans discussed with the patient,  and he is in agreement.  CODE STATUS:     Code Status Orders  (From admission, onward)        Start     Ordered   12/28/17 1741  Full code  Continuous     12/28/17 1740    Code Status History    Date Active Date Inactive Code Status Order ID  Comments User Context   01/19/2017 0615 01/19/2017 1954  Full Code [id number]  Harrie Foreman, MD ED   10/18/2016 1340 10/19/2016 1648 Full Code [id number]  Hollice Espy, MD Inpatient   03/23/2016 2017 03/24/2016 1501 DNR [id number]  Loletha Grayer, MD ED   03/11/2016 1341 03/14/2016 1606 DNR [id number]  Bettey Costa, MD ED   03/03/2016 1536 03/04/2016 0706 DNR [id number]  Bettey Costa, MD Inpatient      TOTAL TIME TAKING CARE OF THIS PATIENT: 35 minutes.    Loletha Grayer M.D on 12/30/2017 at 11:51 AM  Between 7am to 6pm - Pager - [phone]  After 6pm go to [email or web address] - password [email or web address]  [employer] [facility] Office  [phone]  CC: Primary care physician; [name], MD

## 2017-12-30 NOTE — Discharge Instructions (Signed)
Stop clonidine (catapress) , losartan (cozaar), metformin (glucophage) and glipizide XL

## 2017-12-30 NOTE — Progress Notes (Signed)
PT Cancellation Note  Patient Details Name: Michael Mathews MRN: [id number] DOB: [birth year]   Cancelled Treatment:    Reason Eval/Treat Not Completed: Other (comment).  Pt was attempted to be evaluated for PT but has discharged from [facility].  Has been up walking independently per nsg in his room and the hallway.  DC order for PT due to pt being mod I for gait.   Ramond Dial 12/30/2017, 12:31 PM   Mee Hives, PT MS Acute Rehab Dept. Number: [place] and [place]

## 2017-12-30 NOTE — Progress Notes (Signed)
Patient discharged home per MD order. Prescriptions given to patient and all medications reviewed and questions answered. All discharge instructions given and appointments reviewed with patient. Cab voucher given and cab called for transport.

## 2017-12-31 ENCOUNTER — Other Ambulatory Visit: Payer: Self-pay | Admitting: Oncology

## 2018-01-03 ENCOUNTER — Other Ambulatory Visit: Payer: Self-pay | Admitting: Radiology

## 2018-01-04 ENCOUNTER — Ambulatory Visit: Payer: Medicare Other | Admitting: Oncology

## 2018-01-04 ENCOUNTER — Other Ambulatory Visit: Payer: Self-pay

## 2018-01-04 ENCOUNTER — Other Ambulatory Visit: Payer: Medicare Other

## 2018-01-04 ENCOUNTER — Observation Stay
Admission: RE | Admit: 2018-01-04 | Discharge: 2018-01-06 | Disposition: A | Discharge status: Home / Self Care | Payer: Medicare Other | Source: Ambulatory Visit | Attending: Internal Medicine | Admitting: Internal Medicine

## 2018-01-04 DIAGNOSIS — I700 Atherosclerosis of aorta: Secondary | ICD-10-CM | POA: Diagnosis not present

## 2018-01-04 DIAGNOSIS — D62 Acute posthemorrhagic anemia: Secondary | ICD-10-CM | POA: Insufficient documentation

## 2018-01-04 DIAGNOSIS — N400 Enlarged prostate without lower urinary tract symptoms: Secondary | ICD-10-CM | POA: Insufficient documentation

## 2018-01-04 DIAGNOSIS — I97191 Other postprocedural cardiac functional disturbances following other surgery: Secondary | ICD-10-CM | POA: Diagnosis not present

## 2018-01-04 DIAGNOSIS — C7951 Secondary malignant neoplasm of bone: Secondary | ICD-10-CM

## 2018-01-04 DIAGNOSIS — I2510 Atherosclerotic heart disease of native coronary artery without angina pectoris: Secondary | ICD-10-CM | POA: Diagnosis not present

## 2018-01-04 DIAGNOSIS — R188 Other ascites: Secondary | ICD-10-CM | POA: Diagnosis not present

## 2018-01-04 DIAGNOSIS — E785 Hyperlipidemia, unspecified: Secondary | ICD-10-CM | POA: Diagnosis not present

## 2018-01-04 DIAGNOSIS — I10 Essential (primary) hypertension: Secondary | ICD-10-CM | POA: Diagnosis not present

## 2018-01-04 DIAGNOSIS — C7B8 Other secondary neuroendocrine tumors: Secondary | ICD-10-CM | POA: Diagnosis not present

## 2018-01-04 DIAGNOSIS — C61 Malignant neoplasm of prostate: Secondary | ICD-10-CM | POA: Diagnosis present

## 2018-01-04 DIAGNOSIS — Y839 Surgical procedure, unspecified as the cause of abnormal reaction of the patient, or of later complication, without mention of misadventure at the time of the procedure: Secondary | ICD-10-CM | POA: Diagnosis not present

## 2018-01-04 DIAGNOSIS — C787 Secondary malignant neoplasm of liver and intrahepatic bile duct: Secondary | ICD-10-CM | POA: Insufficient documentation

## 2018-01-04 DIAGNOSIS — R0601 Orthopnea: Secondary | ICD-10-CM | POA: Insufficient documentation

## 2018-01-04 DIAGNOSIS — I129 Hypertensive chronic kidney disease with stage 1 through stage 4 chronic kidney disease, or unspecified chronic kidney disease: Secondary | ICD-10-CM | POA: Diagnosis not present

## 2018-01-04 DIAGNOSIS — Z823 Family history of stroke: Secondary | ICD-10-CM | POA: Insufficient documentation

## 2018-01-04 DIAGNOSIS — R55 Syncope and collapse: Secondary | ICD-10-CM | POA: Insufficient documentation

## 2018-01-04 DIAGNOSIS — Z79899 Other long term (current) drug therapy: Secondary | ICD-10-CM | POA: Diagnosis not present

## 2018-01-04 DIAGNOSIS — Z9981 Dependence on supplemental oxygen: Secondary | ICD-10-CM | POA: Diagnosis not present

## 2018-01-04 DIAGNOSIS — N179 Acute kidney failure, unspecified: Secondary | ICD-10-CM | POA: Diagnosis not present

## 2018-01-04 DIAGNOSIS — N281 Cyst of kidney, acquired: Secondary | ICD-10-CM | POA: Insufficient documentation

## 2018-01-04 DIAGNOSIS — D539 Nutritional anemia, unspecified: Secondary | ICD-10-CM | POA: Diagnosis not present

## 2018-01-04 DIAGNOSIS — F17210 Nicotine dependence, cigarettes, uncomplicated: Secondary | ICD-10-CM | POA: Insufficient documentation

## 2018-01-04 DIAGNOSIS — R58 Hemorrhage, not elsewhere classified: Secondary | ICD-10-CM

## 2018-01-04 DIAGNOSIS — E1151 Type 2 diabetes mellitus with diabetic peripheral angiopathy without gangrene: Secondary | ICD-10-CM | POA: Diagnosis not present

## 2018-01-04 DIAGNOSIS — Z8673 Personal history of transient ischemic attack (TIA), and cerebral infarction without residual deficits: Secondary | ICD-10-CM | POA: Insufficient documentation

## 2018-01-04 DIAGNOSIS — I4891 Unspecified atrial fibrillation: Principal | ICD-10-CM | POA: Diagnosis present

## 2018-01-04 DIAGNOSIS — G4730 Sleep apnea, unspecified: Secondary | ICD-10-CM | POA: Insufficient documentation

## 2018-01-04 DIAGNOSIS — D630 Anemia in neoplastic disease: Secondary | ICD-10-CM | POA: Insufficient documentation

## 2018-01-04 DIAGNOSIS — Z8249 Family history of ischemic heart disease and other diseases of the circulatory system: Secondary | ICD-10-CM | POA: Insufficient documentation

## 2018-01-04 DIAGNOSIS — J449 Chronic obstructive pulmonary disease, unspecified: Secondary | ICD-10-CM | POA: Insufficient documentation

## 2018-01-04 DIAGNOSIS — C7A1 Malignant poorly differentiated neuroendocrine tumors: Secondary | ICD-10-CM | POA: Diagnosis not present

## 2018-01-04 DIAGNOSIS — R0600 Dyspnea, unspecified: Secondary | ICD-10-CM | POA: Insufficient documentation

## 2018-01-04 DIAGNOSIS — R932 Abnormal findings on diagnostic imaging of liver and biliary tract: Secondary | ICD-10-CM

## 2018-01-04 DIAGNOSIS — C779 Secondary and unspecified malignant neoplasm of lymph node, unspecified: Secondary | ICD-10-CM | POA: Diagnosis not present

## 2018-01-04 DIAGNOSIS — D649 Anemia, unspecified: Secondary | ICD-10-CM | POA: Diagnosis not present

## 2018-01-04 LAB — BASIC METABOLIC PANEL
ANION GAP: 7 (ref 5–15)
BUN: 18 mg/dL (ref 8–23)
CALCIUM: 7.8 mg/dL — AB (ref 8.9–10.3)
CO2: 27 mmol/L (ref 22–32)
Chloride: 106 mmol/L (ref 98–111)
Creatinine, Ser: 1.05 mg/dL (ref 0.61–1.24)
GFR calc Af Amer: >60 mL/min (ref >60)
Glucose, Bld: 113 mg/dL — ABNORMAL HIGH (ref 70–99)
Potassium: 4 mmol/L (ref 3.5–5.1)
Sodium: 140 mmol/L (ref 135–145)

## 2018-01-04 LAB — GLUCOSE, CAPILLARY
GLUCOSE-CAPILLARY: 96 mg/dL (ref 70–99)
Glucose-Capillary: 112 mg/dL — ABNORMAL HIGH (ref 70–99)

## 2018-01-04 LAB — PROTIME-INR
INR: 1.11
Prothrombin Time: 14.2 seconds (ref 11.4–15.2)

## 2018-01-04 LAB — CBC
HCT: 25.2 % — ABNORMAL LOW (ref 40.0–52.0)
Hemoglobin: 8.5 g/dL — ABNORMAL LOW (ref 13.0–18.0)
MCH: 32.5 pg (ref 26.0–34.0)
MCHC: 33.8 g/dL (ref 32.0–36.0)
MCV: 96.3 fL (ref 80.0–100.0)
PLATELETS: 78 10*3/uL — AB (ref 150–440)
RBC: 2.62 MIL/uL — AB (ref 4.40–5.90)
RDW: 17.8 % — ABNORMAL HIGH (ref 11.5–14.5)
WBC: 6.2 10*3/uL (ref 3.8–10.6)

## 2018-01-04 LAB — TSH: TSH: 1.28 u[IU]/mL (ref 0.350–4.500)

## 2018-01-04 MED ORDER — FINASTERIDE 5 MG PO TABS
5.0000 mg | ORAL_TABLET | Freq: Every day | ORAL | Status: DC
  Administered 2018-01-04 – 2018-01-06 (×3): 5 mg via ORAL
  Filled 2018-01-04 (×3): qty 1

## 2018-01-04 MED ORDER — PANTOPRAZOLE SODIUM 40 MG PO TBEC
40.0000 mg | DELAYED_RELEASE_TABLET | Freq: Every day | ORAL | Status: DC
  Administered 2018-01-04 – 2018-01-06 (×3): 40 mg via ORAL
  Filled 2018-01-04 (×3): qty 1

## 2018-01-04 MED ORDER — VERAPAMIL HCL 2.5 MG/ML IV SOLN
2.5000 mg | Freq: Four times a day (QID) | INTRAVENOUS | Status: DC | PRN
  Filled 2018-01-04: qty 1

## 2018-01-04 MED ORDER — DILTIAZEM HCL-DEXTROSE 100-5 MG/100ML-% IV SOLN (PREMIX)
5.0000 mg/h | INTRAVENOUS | Status: DC
  Administered 2018-01-04 – 2018-01-05 (×2): 5 mg/h via INTRAVENOUS
  Filled 2018-01-04 (×3): qty 100

## 2018-01-04 MED ORDER — MIDAZOLAM HCL 5 MG/5ML IJ SOLN
INTRAMUSCULAR | Status: AC | PRN
  Administered 2018-01-04 (×2): 1 mg via INTRAVENOUS

## 2018-01-04 MED ORDER — NICOTINE 14 MG/24HR TD PT24: 14.0000 mg | MEDICATED_PATCH | Freq: Every day | TRANSDERMAL | Status: DC | PRN

## 2018-01-04 MED ORDER — METOPROLOL TARTRATE 5 MG/5ML IV SOLN
INTRAVENOUS | Status: AC
  Administered 2018-01-04: 5 mg via INTRAVENOUS
  Filled 2018-01-04: qty 5

## 2018-01-04 MED ORDER — DILTIAZEM LOAD VIA INFUSION
10.0000 mg | Freq: Once | INTRAVENOUS | Status: AC
  Administered 2018-01-04: 10 mg via INTRAVENOUS
  Filled 2018-01-04: qty 10

## 2018-01-04 MED ORDER — MIDAZOLAM HCL 5 MG/5ML IJ SOLN
INTRAMUSCULAR | Status: AC
  Filled 2018-01-04: qty 5

## 2018-01-04 MED ORDER — METOPROLOL TARTRATE 50 MG PO TABS
50.0000 mg | ORAL_TABLET | Freq: Two times a day (BID) | ORAL | Status: DC
  Administered 2018-01-04: 50 mg via ORAL
  Filled 2018-01-04 (×2): qty 1

## 2018-01-04 MED ORDER — TAMSULOSIN HCL 0.4 MG PO CAPS
0.4000 mg | ORAL_CAPSULE | Freq: Every day | ORAL | Status: DC
  Administered 2018-01-04 – 2018-01-06 (×3): 0.4 mg via ORAL
  Filled 2018-01-04 (×3): qty 1

## 2018-01-04 MED ORDER — SODIUM CHLORIDE 0.9 % IV BOLUS
250.0000 mL | Freq: Once | INTRAVENOUS | Status: AC
  Administered 2018-01-04: 250 mL via INTRAVENOUS

## 2018-01-04 MED ORDER — OLANZAPINE 10 MG PO TABS
10.0000 mg | ORAL_TABLET | Freq: Every day | ORAL | Status: DC
  Administered 2018-01-04 – 2018-01-05 (×2): 10 mg via ORAL
  Filled 2018-01-04 (×3): qty 1

## 2018-01-04 MED ORDER — DULOXETINE HCL 20 MG PO CPEP
20.0000 mg | ORAL_CAPSULE | Freq: Every day | ORAL | Status: DC
  Administered 2018-01-04 – 2018-01-05 (×2): 20 mg via ORAL
  Filled 2018-01-04 (×3): qty 1

## 2018-01-04 MED ORDER — VITAMIN B-12 1000 MCG PO TABS
1000.0000 ug | ORAL_TABLET | Freq: Every day | ORAL | Status: DC
  Administered 2018-01-04 – 2018-01-06 (×3): 1000 ug via ORAL
  Filled 2018-01-04 (×3): qty 1

## 2018-01-04 MED ORDER — FENTANYL CITRATE (PF) 100 MCG/2ML IJ SOLN
INTRAMUSCULAR | Status: AC | PRN
  Administered 2018-01-04: 50 ug via INTRAVENOUS

## 2018-01-04 MED ORDER — METOPROLOL TARTRATE 5 MG/5ML IV SOLN
5.0000 mg | Freq: Once | INTRAVENOUS | Status: AC
  Administered 2018-01-04: 5 mg via INTRAVENOUS

## 2018-01-04 MED ORDER — FERROUS SULFATE 325 (65 FE) MG PO TABS
325.0000 mg | ORAL_TABLET | Freq: Two times a day (BID) | ORAL | Status: DC
  Administered 2018-01-04 – 2018-01-06 (×4): 325 mg via ORAL
  Filled 2018-01-04 (×4): qty 1

## 2018-01-04 MED ORDER — FENTANYL CITRATE (PF) 100 MCG/2ML IJ SOLN
INTRAMUSCULAR | Status: AC
  Filled 2018-01-04: qty 4

## 2018-01-04 MED ORDER — ONDANSETRON HCL 4 MG PO TABS: 4.0000 mg | ORAL_TABLET | Freq: Three times a day (TID) | ORAL | Status: DC | PRN

## 2018-01-04 MED ORDER — DOCUSATE SODIUM 100 MG PO CAPS: 100.0000 mg | ORAL_CAPSULE | Freq: Two times a day (BID) | ORAL | Status: DC | PRN

## 2018-01-04 MED ORDER — MOMETASONE FURO-FORMOTEROL FUM 200-5 MCG/ACT IN AERO
2.0000 | INHALATION_SPRAY | Freq: Two times a day (BID) | RESPIRATORY_TRACT | Status: DC
  Administered 2018-01-04 – 2018-01-06 (×4): 2 via RESPIRATORY_TRACT
  Filled 2018-01-04: qty 8.8

## 2018-01-04 MED ORDER — AMIODARONE HCL 200 MG PO TABS: 200.0000 mg | ORAL_TABLET | Freq: Every day | ORAL | Status: DC

## 2018-01-04 MED ORDER — OXYCODONE HCL 5 MG PO TABS: 5.0000 mg | ORAL_TABLET | ORAL | Status: DC | PRN

## 2018-01-04 MED ORDER — ATORVASTATIN CALCIUM 20 MG PO TABS
40.0000 mg | ORAL_TABLET | Freq: Every day | ORAL | Status: DC
  Administered 2018-01-04 – 2018-01-06 (×3): 40 mg via ORAL
  Filled 2018-01-04 (×3): qty 2

## 2018-01-04 MED ORDER — SODIUM CHLORIDE 0.9 % IV SOLN
INTRAVENOUS | Status: DC
  Administered 2018-01-04: 1000 mL via INTRAVENOUS

## 2018-01-04 MED ORDER — HEPARIN SODIUM (PORCINE) 5000 UNIT/ML IJ SOLN
5000.0000 [IU] | Freq: Three times a day (TID) | INTRAMUSCULAR | Status: DC
  Filled 2018-01-04: qty 1

## 2018-01-04 NOTE — Progress Notes (Signed)
Family Meeting Note  Advance Directive:no  Today a meeting took place with the Patient.  The following clinical team members were present during this meeting:MD  The following were discussed:Patient's diagnosis: A fib with RVR< Metastatic cancer, Patient's progosis: Unable to determine and Goals for treatment: Full Code   Pt have metastatic cancer, I spoke to him- he does have funeral arrangements signed, but does not have living will. He will like his friend to be his POA, but no paperwork done so far. He will like to be a full code, but does not want to stay alive on life support- if no recovery.  Additional follow-up to be provided: Cardiology  Time spent during discussion:20 minutes  [name], MD

## 2018-01-04 NOTE — Progress Notes (Signed)
   01/04/18 1845  Clinical Encounter Type  Visited With Patient  Visit Type Follow-up (advanced directive order)  Referral From Physician  Consult/Referral To Chaplain   Chaplain spoke with patient regarding advanced directive order.  Patient stated that 'he's not against them, but he doesn't want one.'  Chaplain engaged in active and reflective listening as patient processed how today had not gone as planned and his desire to return home.

## 2018-01-04 NOTE — Plan of Care (Signed)
  Problem: Health Behavior/Discharge Planning: Goal: Ability to manage health-related needs will improve Outcome: Progressing Note:  Patient restarted on a Cardizem drip at 5 /hr. Will continue to monitor heart rate and [facility] Wenda Low [facility]

## 2018-01-04 NOTE — Procedures (Signed)
US guided core biopsies of right hepatic lesion.  Gelfoam injected along needle tract after biopsy.  Minimal blood loss and no immediate complication.

## 2018-01-04 NOTE — Progress Notes (Signed)
Patient tore out own PIV, reason unknown, at this time. Cardizem drip paused. Michael Mathews [facility]

## 2018-01-04 NOTE — H&P (Signed)
[facility] NAME: Michael Mathews    MR#:  [id number]  DATE OF BIRTH:  1946/02/02  DATE OF ADMISSION:  01/04/2018  PRIMARY CARE PHYSICIAN: [name], MD   REQUESTING/REFERRING PHYSICIAN: [place]  CHIEF COMPLAINT:  No chief complaint on file.   HISTORY OF PRESENT ILLNESS: Michael Mathews  is a 72 y.o. male with a known history of anemia, prostate cancer, COPD, coronary artery disease, diabetes, hypertension, sleep apnea-was admitted to hospital for symptomatic anemia and atrial fibrillation last week where he also was diagnosed having new liver nodules. He was referred to IR for liver biopsy by oncologist. Today he came for procedure, but he was noted to have tachycardia with A. fib and I ER physician suggested to monitor him in the hospital after the procedure. Patient lives alone at home, has no family and so IR physician was concerned about his safety. I had seen the patient after the procedure in the recovery room, he did not had any complaints but his heart rate was running around 140 to 150/min and blood pressure was 130/100.  We had given metoprolol injection 5 mg one-time dose and after that his blood pressure dropped up to 100/70 and when he tried to get up to urinate, he had possibly a vasovagal episode where he became diaphoretic and minimally responsive for a short period of time-as per the nurse in specials recovery room.  I had seen him again and he was completely alert and oriented at that time.  PAST MEDICAL HISTORY:   Past Medical History:  Diagnosis Date  . Anemia   . Arthritis   . Cancer [facility])    prostate  . COPD (chronic obstructive pulmonary disease) ([place])   . Coronary artery disease   . Cough    chronic  . Diabetes mellitus without complication ([place])   . Edema   . Elevated PSA   . Headache   . Hypertension   . Orthopnea   . Oxygen deficit    o2 prn  . Prostate cancer ([place])   . Shortness of breath dyspnea   .  Sleep apnea    cpap    PAST SURGICAL HISTORY:  Past Surgical History:  Procedure Laterality Date  . CARDIAC CATHETERIZATION  2014  . COLONOSCOPY    . CYSTOSCOPY W/ RETROGRADES Bilateral 10/18/2016   Procedure: CYSTOSCOPY WITH RETROGRADE PYELOGRAM;  Surgeon: Hollice Espy, MD;  Location: [facility] ORS;  Service: Urology;  Laterality: Bilateral;  . EYE SURGERY    . KNEE ARTHROSCOPY    . TRANSURETHRAL RESECTION OF BLADDER TUMOR N/A 10/18/2016   Procedure: TRANSURETHRAL RESECTION OF BLADDER TUMOR (TURBT);  Surgeon: Hollice Espy, MD;  Location: [facility] ORS;  Service: Urology;  Laterality: N/A;  . TRANSURETHRAL RESECTION OF PROSTATE N/A 10/18/2016   Procedure: TRANSURETHRAL RESECTION OF THE PROSTATE (TURP) CHANNEL TURP;  Surgeon: Hollice Espy, MD;  Location: [facility] ORS;  Service: Urology;  Laterality: N/A;    SOCIAL HISTORY:  Social History   Tobacco Use  . Smoking status: Current Every Day Smoker    Packs/day: 0.50    Years: 55.00    Pack years: 27.50    Types: Cigarettes  . Smokeless tobacco: Never Used  Substance Use Topics  . Alcohol use: No    FAMILY HISTORY:  Family History  Problem Relation Age of Onset  . CVA Mother   . CAD Father     DRUG ALLERGIES: No Known Allergies  REVIEW OF SYSTEMS:  CONSTITUTIONAL: No fever, fatigue or weakness.  EYES: No blurred or double vision.  EARS, NOSE, AND THROAT: No tinnitus or ear pain.  RESPIRATORY: No cough, shortness of breath, wheezing or hemoptysis.  CARDIOVASCULAR: No chest pain, orthopnea, edema.  GASTROINTESTINAL: No nausea, vomiting, diarrhea or abdominal pain.  GENITOURINARY: No dysuria, hematuria.  ENDOCRINE: No polyuria, nocturia,  HEMATOLOGY: No anemia, easy bruising or bleeding SKIN: No rash or lesion. MUSCULOSKELETAL: No joint pain or arthritis.   NEUROLOGIC: No tingling, numbness, weakness.  PSYCHIATRY: No anxiety or depression.   MEDICATIONS AT HOME:  Prior to Admission medications   Medication Sig Start Date  End Date Taking? Authorizing Provider  amiodarone (PACERONE) 200 MG tablet Two tablets daily for four days then one tablet daily 12/30/17  Yes Wieting, Richard, MD  atorvastatin (LIPITOR) 40 MG tablet Take 1 tablet (40 mg total) by mouth daily. 03/24/16  Yes Bettey Costa, MD  DULoxetine (CYMBALTA) 30 MG capsule  11/20/17  Yes [provider]  ferrous sulfate 325 (65 FE) MG EC tablet Take 1 tablet (325 mg total) by mouth 2 (two) times daily with a meal. 07/28/16  Yes Sindy Guadeloupe, MD  finasteride (PROSCAR) 5 MG tablet Take 5 mg by mouth daily.   Yes [provider]  Fluticasone-Salmeterol (ADVAIR) 250-50 MCG/DOSE AEPB Inhale 1 puff into the lungs 2 (two) times daily.   Yes [provider]  ketoconazole (NIZORAL) 2 % cream Apply 1 application topically daily as needed.  04/03/17  Yes [provider]  metoprolol (LOPRESSOR) 50 MG tablet Take 50 mg by mouth 2 (two) times daily.   Yes [provider]  nicotine (NICODERM CQ - DOSED IN MG/24 HOURS) 14 mg/24hr patch Place 1 patch (14 mg total) onto the skin daily as needed (nicotine craving). Okay to substitute generic 12/30/17  Yes Wieting, Richard, MD  OLANZapine (ZYPREXA) 10 MG tablet Take 1 tablet (10 mg total) by mouth at bedtime. 12/28/17  Yes Sindy Guadeloupe, MD  ondansetron (ZOFRAN) 4 MG tablet Take 1 tablet (4 mg total) by mouth every 8 (eight) hours as needed for nausea or vomiting. 12/28/17  Yes Sindy Guadeloupe, MD  pantoprazole (PROTONIX) 40 MG tablet Take 1 tablet (40 mg total) by mouth daily. 12/30/17  Yes Wieting, Richard, MD  tamsulosin (FLOMAX) 0.4 MG CAPS capsule Take 1 capsule (0.4 mg total) by mouth daily. 05/29/16  Yes [name], MD  vitamin B-12 (CYANOCOBALAMIN) 1000 MCG tablet Take 1 tablet (1,000 mcg total) by mouth daily. 07/28/16  Yes Sindy Guadeloupe, MD  abiraterone Acetate (ZYTIGA) 250 MG tablet Take 4 tablets (1,000 mg total) by mouth daily. Take on an empty stomach 1 hour before or 2 hours  after a meal Patient not taking: Reported on 12/28/2017 03/12/17   Sindy Guadeloupe, MD      PHYSICAL EXAMINATION:   VITAL SIGNS: Blood pressure (!) 131/107, pulse (!) 47, temperature (!) 97.5 F (36.4 C), temperature source Oral, resp. rate 20, height 5\' 10"  (1.778 m), weight 99.3 kg (219 lb), SpO2 95 %.  GENERAL:  72 y.o.-year-old patient lying in the bed with no acute distress.  EYES: Pupils equal, round, reactive to light and accommodation. No scleral icterus. Extraocular muscles intact.  HEENT: Head atraumatic, normocephalic. Oropharynx and nasopharynx clear.  NECK:  Supple, no jugular venous distention. No thyroid enlargement, no tenderness.  LUNGS: Normal breath sounds bilaterally, no wheezing, rales,rhonchi or crepitation. No use of accessory muscles of respiration.  CARDIOVASCULAR: S1, S2 fast, irregular.  No murmurs, rubs, or gallops.  ABDOMEN: Soft, nontender, nondistended. Bowel sounds present. No organomegaly or mass.  EXTREMITIES: No pedal edema, cyanosis, or clubbing.  NEUROLOGIC: Cranial nerves II through XII are intact. Muscle strength 5/5 in all extremities. Sensation intact. Gait not checked.  PSYCHIATRIC: The patient is alert and oriented x 3.  SKIN: No obvious rash, lesion, or ulcer.   LABORATORY PANEL:   CBC Recent Labs  Lab 12/28/17 1437 12/29/17 0638 12/30/17 0708 01/04/18 0851  WBC 5.1 5.8 5.6 6.2  HGB 5.0* 7.2* 8.0* 8.5*  HCT 15.1* 20.4* 22.6* 25.2*  PLT 104* 87* 83* 78*  MCV 98.2 94.4 94.0 96.3  MCH 32.2 33.2 33.2 32.5  MCHC 32.8 35.1 35.3 33.8  RDW 20.3* 18.8* 17.9* 17.8*  LYMPHSABS 0.5*  --   --   --   MONOABS 0.5  --   --   --   EOSABS 0.0  --   --   --   BASOSABS 0.0  --   --   --    ------------------------------------------------------------------------------------------------------------------  Chemistries  Recent Labs  Lab 12/28/17 1437 12/29/17 0638 12/30/17 0708  NA 138 141 142  K 3.6 4.4 3.7  CL 106 107 110  CO2 21* 24 26   GLUCOSE 103* 146* 103*  BUN 32* 35* 33*  CREATININE 2.13* 2.29* 1.68*  CALCIUM 8.2* 8.0* 7.8*  AST 50*  --   --   ALT 22  --   --   ALKPHOS 245*  --   --   BILITOT 1.2  --   --    ------------------------------------------------------------------------------------------------------------------ estimated creatinine clearance is 46.3 mL/min (A) (by C-G formula based on SCr of 1.68 mg/dL (H)). ------------------------------------------------------------------------------------------------------------------ No results for input(s): TSH, T4TOTAL, T3FREE, THYROIDAB in the last 72 hours.  Invalid input(s): FREET3   Coagulation profile Recent Labs  Lab 01/04/18 0851  INR 1.11   ------------------------------------------------------------------------------------------------------------------- No results for input(s): DDIMER in the last 72 hours. -------------------------------------------------------------------------------------------------------------------  Cardiac Enzymes No results for input(s): CKMB, TROPONINI, MYOGLOBIN in the last 168 hours.  Invalid input(s): CK ------------------------------------------------------------------------------------------------------------------ Invalid input(s): POCBNP  ---------------------------------------------------------------------------------------------------------------  Urinalysis    Component Value Date/Time   COLORURINE STRAW (A) 12/29/2017 1308   APPEARANCEUR CLEAR (A) 12/29/2017 1308   APPEARANCEUR Clear 09/04/2016 1045   LABSPEC 1.005 12/29/2017 1308   PHURINE 5.0 12/29/2017 1308   GLUCOSEU NEGATIVE 12/29/2017 1308   HGBUR NEGATIVE 12/29/2017 1308   BILIRUBINUR NEGATIVE 12/29/2017 1308   BILIRUBINUR Negative 09/04/2016 1045   KETONESUR NEGATIVE 12/29/2017 1308   PROTEINUR NEGATIVE 12/29/2017 1308   NITRITE NEGATIVE 12/29/2017 1308   LEUKOCYTESUR NEGATIVE 12/29/2017 1308   LEUKOCYTESUR Negative 09/04/2016 1045      RADIOLOGY: [place] Core Biopsy (liver)  Result Date: 01/04/2018 INDICATION: 72 year old male with metastatic prostate cancer. Patient has numerous liver lesions and request for liver lesion biopsy. EXAM: ULTRASOUND-GUIDED LIVER LESION BIOPSY MEDICATIONS: None. ANESTHESIA/SEDATION: Moderate (conscious) sedation was employed during this procedure. A total of Versed 2 mg and Fentanyl 50 mcg was administered intravenously. Moderate Sedation Time: 10 minutes. The patient's level of consciousness and vital signs were monitored continuously by radiology nursing throughout the procedure under my direct supervision. FLUOROSCOPY TIME:  None COMPLICATIONS: None immediate. PROCEDURE: Informed written consent was obtained from the patient after a thorough discussion of the procedural risks, benefits and alternatives. All questions were addressed. A timeout was performed prior to the initiation of the procedure. Liver was evaluated with ultrasound. Lesion along the inferior right hepatic lobe was targeted for biopsy.  The right side of the abdomen was prepped with chlorhexidine and a sterile field was created. Skin and soft tissues anesthetized with 1% lidocaine. 17 gauge coaxial needle directed into the liver and lesion with ultrasound guidance. Three core biopsies were obtained with an 18 gauge core device. Specimens placed in formalin. Gel-Foam slurry was injected through the 17 gauge needle as it was removed. Bandage placed over the puncture site. FINDINGS: Numerous hypoechoic and hyperechoic lesions throughout the liver. Hypoechoic lesion in the inferior right hepatic lobe was targeted. Biopsy needle was confirmed within the lesion. No significant bleeding or hematoma formation following the core biopsies. IMPRESSION: Ultrasound-guided core biopsies of a right hepatic lesion. Electronically Signed   By: Markus Daft M.D.   On: 01/04/2018 11:55    EKG: Orders placed or performed in visit on 12/28/17  . EKG 12-Lead     IMPRESSION AND PLAN:  *A. fib with RVR Patient most likely missed his medications today morning because of procedure. We will restart his metoprolol and amiodarone.  Give him verapamil injections as needed. Not on anticoagulation because of anemia.  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 Prognosis is poor and I would suggest palliative care and consult.  *Vasovagal syncopal episode   This happened after injection metoprolol in the recovery room. Monitor on telemetry.  *Benign gastric hypertrophy Continue finasteride and Flomax.  *Essential hypertension Continue with metoprolol and amiodarone.  *Anemia Likely due to chronic disease, cancer Monitor.  *Acute renal insufficiency Check BMP now.  All the records are reviewed and case discussed with ED provider. Management plans discussed with the patient, family and they are in agreement.  CODE STATUS: full    Code Status Orders  (From admission, onward)        Start     Ordered   01/04/18 1353  Full code  Continuous     01/04/18 1352    Code Status History    Date Active Date Inactive Code Status Order ID Comments User Context   12/28/2017 1740 12/30/2017 1813 Full Code [id number]  Loletha Grayer, MD Inpatient   01/19/2017 0615 01/19/2017 1954 Full Code [id number]  Harrie Foreman, MD ED   10/18/2016 1340 10/19/2016 1648 Full Code [id number]  Hollice Espy, MD Inpatient   03/23/2016 2017 03/24/2016 1501 DNR [id number]  Loletha Grayer, MD ED   03/11/2016 1341 03/14/2016 1606 DNR [id number]  Bettey Costa, MD ED   03/03/2016 1536 03/04/2016 0706 DNR [id number]  Bettey Costa, MD Inpatient       TOTAL TIME TAKING CARE OF THIS PATIENT: 35 minutes.    [name] M.D on 01/04/2018   Between 7am to 6pm - Pager - [phone]  After 6pm go to [email or web address] - password [email or web address] [facility]  Office   [phone]  CC: Primary care physician; [name], MD   Note: This dictation was prepared with Dragon dictation along with smaller phrase technology. Any transcriptional errors that result from this process are unintentional.

## 2018-01-04 NOTE — Consult Note (Signed)
Cardiology Consultation Note    Patient ID: Michael Mathews, MRN: [id number], DOB/AGE: 73-Mar-1947 73 y.o. Admit date: 01/04/2018   Date of Consult: 01/04/2018 Primary Physician: [name], MD Primary Cardiologist: Dr. Nehemiah Massed  Chief Complaint: tachycardia Reason for Consultation: afib with rvr Requesting MD: Dr. Anselm Jungling  HPI: Michael Mathews is a 72 y.o. male with history of hypertension,metastatic prostate cancer pvd, cva, dm and hyperlipidemia who deveoped afib with rvr after presenting for liver biospy.  Marland Kitchen He was recently admitted with anemia  Requiring transfusion. He deferred gi work. He had acute on chronic renal insuffiency during that admission and had afib with rvr. He was treated with metoprolol and amiodarone. He was not anticoagulated due to anemia. He was discharged 7/21. He presented for liver biopsy today and was noted to have afib with rvr. He is a difficult historian. Unclear whether he is compliant with his meds. He currently is hemodynamically stable and has afib with variable vr.  Creatinine is stable at 1.05.   Past Medical History:  Diagnosis Date  . Anemia   . Arthritis   . Cancer [facility])    prostate  . COPD (chronic obstructive pulmonary disease) ([place])   . Coronary artery disease   . Cough    chronic  . Diabetes mellitus without complication ([place])   . Edema   . Elevated PSA   . Headache   . Hypertension   . Orthopnea   . Oxygen deficit    o2 prn  . Prostate cancer ([place])   . Shortness of breath dyspnea   . Sleep apnea    cpap      Surgical History:  Past Surgical History:  Procedure Laterality Date  . CARDIAC CATHETERIZATION  2014  . COLONOSCOPY    . CYSTOSCOPY W/ RETROGRADES Bilateral 10/18/2016   Procedure: CYSTOSCOPY WITH RETROGRADE PYELOGRAM;  Surgeon: Hollice Espy, MD;  Location: [facility] ORS;  Service: Urology;  Laterality: Bilateral;  . EYE SURGERY    . KNEE ARTHROSCOPY    . TRANSURETHRAL RESECTION OF BLADDER TUMOR N/A 10/18/2016    Procedure: TRANSURETHRAL RESECTION OF BLADDER TUMOR (TURBT);  Surgeon: Hollice Espy, MD;  Location: [facility] ORS;  Service: Urology;  Laterality: N/A;  . TRANSURETHRAL RESECTION OF PROSTATE N/A 10/18/2016   Procedure: TRANSURETHRAL RESECTION OF THE PROSTATE (TURP) CHANNEL TURP;  Surgeon: Hollice Espy, MD;  Location: [facility] ORS;  Service: Urology;  Laterality: N/A;     Home Meds: Prior to Admission medications   Medication Sig Start Date End Date Taking? Authorizing Provider  amiodarone (PACERONE) 200 MG tablet Two tablets daily for four days then one tablet daily 12/30/17  Yes Wieting, Richard, MD  atorvastatin (LIPITOR) 40 MG tablet Take 1 tablet (40 mg total) by mouth daily. 03/24/16  Yes Bettey Costa, MD  DULoxetine (CYMBALTA) 30 MG capsule  11/20/17  Yes [provider]  ferrous sulfate 325 (65 FE) MG EC tablet Take 1 tablet (325 mg total) by mouth 2 (two) times daily with a meal. 07/28/16  Yes Sindy Guadeloupe, MD  finasteride (PROSCAR) 5 MG tablet Take 5 mg by mouth daily.   Yes [provider]  Fluticasone-Salmeterol (ADVAIR) 250-50 MCG/DOSE AEPB Inhale 1 puff into the lungs 2 (two) times daily.   Yes [provider]  ketoconazole (NIZORAL) 2 % cream Apply 1 application topically daily as needed.  04/03/17  Yes [provider]  metoprolol (LOPRESSOR) 50 MG tablet Take 50 mg by mouth 2 (two) times daily.  Yes [provider]  nicotine (NICODERM CQ - DOSED IN MG/24 HOURS) 14 mg/24hr patch Place 1 patch (14 mg total) onto the skin daily as needed (nicotine craving). Okay to substitute generic 12/30/17  Yes Wieting, Richard, MD  OLANZapine (ZYPREXA) 10 MG tablet Take 1 tablet (10 mg total) by mouth at bedtime. 12/28/17  Yes Sindy Guadeloupe, MD  ondansetron (ZOFRAN) 4 MG tablet Take 1 tablet (4 mg total) by mouth every 8 (eight) hours as needed for nausea or vomiting. 12/28/17  Yes Sindy Guadeloupe, MD  pantoprazole (PROTONIX) 40 MG tablet Take 1 tablet (40 mg  total) by mouth daily. 12/30/17  Yes Wieting, Richard, MD  tamsulosin (FLOMAX) 0.4 MG CAPS capsule Take 1 capsule (0.4 mg total) by mouth daily. 05/29/16  Yes [name], MD  vitamin B-12 (CYANOCOBALAMIN) 1000 MCG tablet Take 1 tablet (1,000 mcg total) by mouth daily. 07/28/16  Yes Sindy Guadeloupe, MD  abiraterone Acetate (ZYTIGA) 250 MG tablet Take 4 tablets (1,000 mg total) by mouth daily. Take on an empty stomach 1 hour before or 2 hours after a meal Patient not taking: Reported on 12/28/2017 03/12/17   Sindy Guadeloupe, MD    Inpatient Medications:  . amiodarone  200 mg Oral Daily  . atorvastatin  40 mg Oral Daily  . diltiazem  10 mg Intravenous Once  . DULoxetine  20 mg Oral Daily  . fentaNYL      . ferrous sulfate  325 mg Oral BID WC  . finasteride  5 mg Oral Daily  . heparin  5,000 Units Subcutaneous Q8H  . metoprolol tartrate  50 mg Oral BID  . midazolam      . mometasone-formoterol  2 puff Inhalation BID  . OLANZapine  10 mg Oral QHS  . pantoprazole  40 mg Oral Daily  . tamsulosin  0.4 mg Oral Daily  . vitamin B-12  1,000 mcg Oral Daily   . diltiazem (CARDIZEM) infusion      Allergies: No Known Allergies  Social History   Socioeconomic History  . Marital status: Single    Spouse name: Not on file  . Number of children: Not on file  . Years of education: Not on file  . Highest education level: Not on file  Occupational History  . Not on file  Social Needs  . Financial resource strain: Not on file  . Food insecurity:    Worry: Not on file    Inability: Not on file  . Transportation needs:    Medical: Not on file    Non-medical: Not on file  Tobacco Use  . Smoking status: Current Every Day Smoker    Packs/day: 0.50    Years: 55.00    Pack years: 27.50    Types: Cigarettes  . Smokeless tobacco: Never Used  Substance and Sexual Activity  . Alcohol use: No  . Drug use: No  . Sexual activity: Not Currently  Lifestyle  . Physical activity:    Days per week:  Not on file    Minutes per session: Not on file  . Stress: Not on file  Relationships  . Social connections:    Talks on phone: Not on file    Gets together: Not on file    Attends religious service: Not on file    Active member of club or organization: Not on file    Attends meetings of clubs or organizations: Not on file    Relationship status: Not on file  .  Intimate partner violence:    Fear of current or ex partner: Not on file    Emotionally abused: Not on file    Physically abused: Not on file    Forced sexual activity: Not on file  Other Topics Concern  . Not on file  Social History Narrative  . Not on file     Family History  Problem Relation Age of Onset  . CVA Mother   . CAD Father      Review of Systems: A 12-system review of systems was performed and is negative except as noted in the HPI.  Labs: No results for input(s): CKTOTAL, CKMB, TROPONINI in the last 72 hours. Lab Results  Component Value Date   WBC 6.2 01/04/2018   HGB 8.5 (L) 01/04/2018   HCT 25.2 (L) 01/04/2018   MCV 96.3 01/04/2018   PLT 78 (L) 01/04/2018    Recent Labs  Lab 12/30/17 0708  NA 142  K 3.7  CL 110  CO2 26  BUN 33*  CREATININE 1.68*  CALCIUM 7.8*  GLUCOSE 103*   No results found for: CHOL, HDL, LDLCALC, TRIG No results found for: DDIMER  Radiology/Studies:  US Renal  Result Date: 2018-01-09 CLINICAL DATA:  Acute renal insufficiency. History of prostate carcinoma EXAM: RENAL / URINARY TRACT ULTRASOUND COMPLETE COMPARISON:  PET-CT December 11, 2017 FINDINGS: Right Kidney: Length: 9.4 cm. Echogenicity and renal cortical thickness are within normal limits. No mass, perinephric fluid, or hydronephrosis visualized. There is mild scarring in the upper pole right kidney. No sonographically demonstrable calculus or ureterectasis. Left Kidney: Length: 10.1 cm. Echogenicity and renal cortical thickness are within normal limits. No perinephric fluid or hydronephrosis visualized. There is  a cyst in the lower pole left kidney measuring 0.9 x 1.3 x 1.1 cm. No sonographically demonstrable calculus or ureterectasis. Bladder: Appears normal for degree of bladder distention. IMPRESSION: Slight scarring upper pole right kidney. Small cyst lower pole left kidney. Study otherwise unremarkable. In particular, no obstructing focus in either kidney. Renal cortical thickness and echogenicity are within normal limits bilaterally. Electronically Signed   By: Lowella Grip III M.D.   On: 01/09/18 17:54   Nm Pet Image Initial (pi) Skull Base To Thigh  Result Date: 12/11/2017 CLINICAL DATA:  Subsequent treatment strategy for metastatic prostate cancer. EXAM: NUCLEAR MEDICINE PET SKULL BASE TO THIGH TECHNIQUE: 11.9 mCi F-18 FDG was injected intravenously. Full-ring PET imaging was performed from the skull base to thigh after the radiotracer. CT data was obtained and used for attenuation correction and anatomic localization. Fasting blood glucose: 41 mg/dl COMPARISON:  Nuclear medicine bone scan 05/15/2017 and CT CAP 05/15/2017 FINDINGS: Mediastinal blood pool activity: SUV max 1.9 NECK: No hypermetabolic lymph nodes in the neck. Incidental CT findings: none CHEST: Left-sided pre-vascular lymph node measures 1.2 cm and has an SUV max of 3.8. Increased uptake within right hilar lymph node has an SUV max of 6.4. No pleural effusion identified. Two small nodules in the posterior left upper lobe are identified measuring up to 6 mm. These are too small to characterize by PET-CT. Small nodule in the lateral right upper lobe measures 3 mm, image 68/3. Also too small to characterize by PET-CT. Calcified granuloma noted within the superior segment of right lower lobe. Incidental CT findings: Aortic atherosclerosis. Calcifications in the LAD, left circumflex and RCA coronary artery noted. Calcified right hilar and right paratracheal lymph nodes identified. ABDOMEN/PELVIS: Extensive hypermetabolic liver metastases are  identified involving both lobes of liver. Index lesion  within segment 4b measures 5.4 cm and has an SUV max 28.58. Index lesion within segment 7 measures 6.9 cm and has an SUV max of 28.17. Segment 8 lesion measures 4.4 cm within SUV max of 18.47. No abnormal uptake identified within the pancreas or spleen. The adrenal glands are unremarkable. No hypermetabolic lymph nodes identified within the abdomen. No hypermetabolic pelvic or inguinal lymph nodes identified. Within the prostate gland there is intense radiotracer uptake within SUV max of 9.01. Incidental CT findings: Extensive aortic atherosclerosis with branch vessel disease identified. SKELETON: There is widespread sclerotic bone metastases. Intense, heterogeneous and patchy uptake is identified within the axial and appendicular skeleton compatible with hypermetabolic bone metastases. For example, sclerotic metastasis within the right humeral head have an SUV max equal to 8.8. Sclerotic metastasis within the right iliac bone have an SUV max of 6.07. Focal area of intense uptake within the right femoral neck with corresponding sclerotic metastasis has an SUV max of 5.95. Incidental CT findings: none IMPRESSION: Number 1. Examination is positive for extensive FDG avid, hypermetabolic liver metastases. 2. Widespread hypermetabolic sclerotic bone metastases. 3. Increased uptake within soft tissue in the prostate bed compatible with residual/recurrent hypermetabolic local tumor. 4. Hypermetabolic left-sided mediastinal and right hilar lymph nodes. Suspicious for nodal metastasis. 5. Aortic atherosclerosis and coronary artery atherosclerotic calcifications. Aortic Atherosclerosis (ICD10-I70.0). Electronically Signed   By: Kerby Moors M.D.   On: 12/11/2017 15:28   [place] Core Biopsy (liver)  Result Date: 01/04/2018 INDICATION: 72 year old male with metastatic prostate cancer. Patient has numerous liver lesions and request for liver lesion biopsy. EXAM:  ULTRASOUND-GUIDED LIVER LESION BIOPSY MEDICATIONS: None. ANESTHESIA/SEDATION: Moderate (conscious) sedation was employed during this procedure. A total of Versed 2 mg and Fentanyl 50 mcg was administered intravenously. Moderate Sedation Time: 10 minutes. The patient's level of consciousness and vital signs were monitored continuously by radiology nursing throughout the procedure under my direct supervision. FLUOROSCOPY TIME:  None COMPLICATIONS: None immediate. PROCEDURE: Informed written consent was obtained from the patient after a thorough discussion of the procedural risks, benefits and alternatives. All questions were addressed. A timeout was performed prior to the initiation of the procedure. Liver was evaluated with ultrasound. Lesion along the inferior right hepatic lobe was targeted for biopsy. The right side of the abdomen was prepped with chlorhexidine and a sterile field was created. Skin and soft tissues anesthetized with 1% lidocaine. 17 gauge coaxial needle directed into the liver and lesion with ultrasound guidance. Three core biopsies were obtained with an 18 gauge core device. Specimens placed in formalin. Gel-Foam slurry was injected through the 17 gauge needle as it was removed. Bandage placed over the puncture site. FINDINGS: Numerous hypoechoic and hyperechoic lesions throughout the liver. Hypoechoic lesion in the inferior right hepatic lobe was targeted. Biopsy needle was confirmed within the lesion. No significant bleeding or hematoma formation following the core biopsies. IMPRESSION: Ultrasound-guided core biopsies of a right hepatic lesion. Electronically Signed   By: Markus Daft M.D.   On: 01/04/2018 11:55    Wt Readings from Last 3 Encounters:  01/04/18 99.3 kg (219 lb)  12/28/17 102.2 kg (225 lb 4.8 oz)  12/06/17 104.3 kg (230 lb)    EKG: afib with rvr  Physical Exam:  Blood pressure (!) 121/108, pulse (!) 117, temperature 97.8 F (36.6 C), temperature source Oral, resp. rate  18, height 5\' 10"  (1.778 m), weight 99.3 kg (219 lb), SpO2 99 %. Body mass index is 31.42 kg/m. General: Well developed, well nourished, in  no acute distress. Head: Normocephalic, atraumatic, sclera non-icteric, no xanthomas, nares are without discharge.  Neck: Negative for carotid bruits. JVD not elevated. Lungs: Clear bilaterally to auscultation without wheezes, rales, or rhonchi. Breathing is unlabored. Heart: afib with rvr No murmurs, rubs, or gallops appreciated. Abdomen: Soft, non-tender, non-distended with normoactive bowel sounds. No hepatomegaly. No rebound/guarding. No obvious abdominal masses. Msk:  Strength and tone appear normal for age. Extremities: No clubbing or cyanosis. No edema.  Distal pedal pulses are 2+ and equal bilaterally. Neuro: Alert and oriented X 3. No facial asymmetry. No focal deficit. Moves all extremities spontaneously. Psych:  Responds to questions appropriately with a normal affect.     Assessment and Plan  afib with rvr-hemodynamically stable at present. Will d/c po amiodarone, iv verapamil and give diltiazem 10 mg iv followed by iv cardizem drip. Follow hr. WIll convert to po cardizem after rate contrrol. Will defer anticoagulation for now. Will consider resuming amiodarone. Continue with po metoprolol   Signed, Teodoro Spray MD 01/04/2018, 3:39 PM Pager: [phone]

## 2018-01-04 NOTE — H&P (Signed)
Chief Complaint: Patient was seen in consultation today for US guided liver lesion biopsy at the request of Rao,Archana C  Referring Physician(s): Rao,Archana C   Patient Status: [facility] - Out-pt  History of Present Illness: Michael Mathews is a 72 y.o. male with metastatic prostatic cancer.  PET CT demonstrated multiple liver lesions.  Dr. [name] needs a tissue diagnosis of the liver disease.  Patient has no complaints today.  He smokes half a pack per day.  He lives alone and does not have any family to help him.  In addition, he does not have a telephone.  Patient does not have anyone who can stay with him after sedation and a biopsy.  A neighbor can drive him home but nobody to stay with him.  Past Medical History:  Diagnosis Date  . Anemia   . Arthritis   . Cancer [facility])    prostate  . COPD (chronic obstructive pulmonary disease) ([place])   . Coronary artery disease   . Cough    chronic  . Diabetes mellitus without complication ([place])   . Edema   . Elevated PSA   . Headache   . Hypertension   . Orthopnea   . Oxygen deficit    o2 prn  . Prostate cancer ([place])   . Shortness of breath dyspnea   . Sleep apnea    cpap    Past Surgical History:  Procedure Laterality Date  . CARDIAC CATHETERIZATION  2014  . COLONOSCOPY    . CYSTOSCOPY W/ RETROGRADES Bilateral 10/18/2016   Procedure: CYSTOSCOPY WITH RETROGRADE PYELOGRAM;  Surgeon: Hollice Espy, MD;  Location: [facility] ORS;  Service: Urology;  Laterality: Bilateral;  . EYE SURGERY    . KNEE ARTHROSCOPY    . TRANSURETHRAL RESECTION OF BLADDER TUMOR N/A 10/18/2016   Procedure: TRANSURETHRAL RESECTION OF BLADDER TUMOR (TURBT);  Surgeon: Hollice Espy, MD;  Location: [facility] ORS;  Service: Urology;  Laterality: N/A;  . TRANSURETHRAL RESECTION OF PROSTATE N/A 10/18/2016   Procedure: TRANSURETHRAL RESECTION OF THE PROSTATE (TURP) CHANNEL TURP;  Surgeon: Hollice Espy, MD;  Location: [facility] ORS;  Service: Urology;  Laterality: N/A;     Allergies: Patient has no known allergies.  Medications: Prior to Admission medications   Medication Sig Start Date End Date Taking? Authorizing Provider  amiodarone (PACERONE) 200 MG tablet Two tablets daily for four days then one tablet daily 12/30/17  Yes Wieting, Richard, MD  atorvastatin (LIPITOR) 40 MG tablet Take 1 tablet (40 mg total) by mouth daily. 03/24/16  Yes Bettey Costa, MD  DULoxetine (CYMBALTA) 30 MG capsule  11/20/17  Yes [provider]  ferrous sulfate 325 (65 FE) MG EC tablet Take 1 tablet (325 mg total) by mouth 2 (two) times daily with a meal. 07/28/16  Yes Sindy Guadeloupe, MD  finasteride (PROSCAR) 5 MG tablet Take 5 mg by mouth daily.   Yes [provider]  Fluticasone-Salmeterol (ADVAIR) 250-50 MCG/DOSE AEPB Inhale 1 puff into the lungs 2 (two) times daily.   Yes [provider]  ketoconazole (NIZORAL) 2 % cream Apply 1 application topically daily as needed.  04/03/17  Yes [provider]  metoprolol (LOPRESSOR) 50 MG tablet Take 50 mg by mouth 2 (two) times daily.   Yes [provider]  nicotine (NICODERM CQ - DOSED IN MG/24 HOURS) 14 mg/24hr patch Place 1 patch (14 mg total) onto the skin daily as needed (nicotine craving). Okay to substitute generic 12/30/17  Yes Loletha Grayer, MD  OLANZapine [facility])  10 MG tablet Take 1 tablet (10 mg total) by mouth at bedtime. 12/28/17  Yes Sindy Guadeloupe, MD  ondansetron (ZOFRAN) 4 MG tablet Take 1 tablet (4 mg total) by mouth every 8 (eight) hours as needed for nausea or vomiting. 12/28/17  Yes Sindy Guadeloupe, MD  pantoprazole (PROTONIX) 40 MG tablet Take 1 tablet (40 mg total) by mouth daily. 12/30/17  Yes Wieting, Richard, MD  tamsulosin (FLOMAX) 0.4 MG CAPS capsule Take 1 capsule (0.4 mg total) by mouth daily. 05/29/16  Yes [name], MD  vitamin B-12 (CYANOCOBALAMIN) 1000 MCG tablet Take 1 tablet (1,000 mcg total) by mouth daily. 07/28/16  Yes Sindy Guadeloupe, MD  abiraterone  Acetate (ZYTIGA) 250 MG tablet Take 4 tablets (1,000 mg total) by mouth daily. Take on an empty stomach 1 hour before or 2 hours after a meal Patient not taking: Reported on 12/28/2017 03/12/17   Sindy Guadeloupe, MD     Family History  Problem Relation Age of Onset  . CVA Mother   . CAD Father     Social History   Socioeconomic History  . Marital status: Single    Spouse name: Not on file  . Number of children: Not on file  . Years of education: Not on file  . Highest education level: Not on file  Occupational History  . Not on file  Social Needs  . Financial resource strain: Not on file  . Food insecurity:    Worry: Not on file    Inability: Not on file  . Transportation needs:    Medical: Not on file    Non-medical: Not on file  Tobacco Use  . Smoking status: Current Every Day Smoker    Packs/day: 0.50    Years: 55.00    Pack years: 27.50    Types: Cigarettes  . Smokeless tobacco: Never Used  Substance and Sexual Activity  . Alcohol use: No  . Drug use: No  . Sexual activity: Not Currently  Lifestyle  . Physical activity:    Days per week: Not on file    Minutes per session: Not on file  . Stress: Not on file  Relationships  . Social connections:    Talks on phone: Not on file    Gets together: Not on file    Attends religious service: Not on file    Active member of club or organization: Not on file    Attends meetings of clubs or organizations: Not on file    Relationship status: Not on file  Other Topics Concern  . Not on file  Social History Narrative  . Not on file     Review of Systems  Respiratory: Positive for shortness of breath.     Vital Signs: BP 127/89   Pulse (!) 120   Temp (!) 97.5 F (36.4 C) (Oral)   Resp (!) 21   Ht 5\' 10"  (1.778 m)   Wt 219 lb (99.3 kg)   SpO2 100%   BMI 31.42 kg/m   Physical Exam  Constitutional: No distress.  HENT:  Mouth/Throat: Oropharynx is clear and moist.  Cardiovascular:  Irregular heart rate,  consistent with atrial fibrillation.  Pulmonary/Chest: He has wheezes.  Abdominal: Soft. He exhibits no distension.    Imaging: US Renal  Result Date: 12/29/2017 CLINICAL DATA:  Acute renal insufficiency. History of prostate carcinoma EXAM: RENAL / URINARY TRACT ULTRASOUND COMPLETE COMPARISON:  PET-CT December 11, 2017 FINDINGS: Right Kidney: Length: 9.4 cm. Echogenicity and  renal cortical thickness are within normal limits. No mass, perinephric fluid, or hydronephrosis visualized. There is mild scarring in the upper pole right kidney. No sonographically demonstrable calculus or ureterectasis. Left Kidney: Length: 10.1 cm. Echogenicity and renal cortical thickness are within normal limits. No perinephric fluid or hydronephrosis visualized. There is a cyst in the lower pole left kidney measuring 0.9 x 1.3 x 1.1 cm. No sonographically demonstrable calculus or ureterectasis. Bladder: Appears normal for degree of bladder distention. IMPRESSION: Slight scarring upper pole right kidney. Small cyst lower pole left kidney. Study otherwise unremarkable. In particular, no obstructing focus in either kidney. Renal cortical thickness and echogenicity are within normal limits bilaterally. Electronically Signed   By: Lowella Grip III M.D.   On: 12/29/2017 17:54   Nm Pet Image Initial (pi) Skull Base To Thigh  Result Date: 12/11/2017 CLINICAL DATA:  Subsequent treatment strategy for metastatic prostate cancer. EXAM: NUCLEAR MEDICINE PET SKULL BASE TO THIGH TECHNIQUE: 11.9 mCi F-18 FDG was injected intravenously. Full-ring PET imaging was performed from the skull base to thigh after the radiotracer. CT data was obtained and used for attenuation correction and anatomic localization. Fasting blood glucose: 41 mg/dl COMPARISON:  Nuclear medicine bone scan 05/15/2017 and CT CAP 05/15/2017 FINDINGS: Mediastinal blood pool activity: SUV max 1.9 NECK: No hypermetabolic lymph nodes in the neck. Incidental CT findings: none  CHEST: Left-sided pre-vascular lymph node measures 1.2 cm and has an SUV max of 3.8. Increased uptake within right hilar lymph node has an SUV max of 6.4. No pleural effusion identified. Two small nodules in the posterior left upper lobe are identified measuring up to 6 mm. These are too small to characterize by PET-CT. Small nodule in the lateral right upper lobe measures 3 mm, image 68/3. Also too small to characterize by PET-CT. Calcified granuloma noted within the superior segment of right lower lobe. Incidental CT findings: Aortic atherosclerosis. Calcifications in the LAD, left circumflex and RCA coronary artery noted. Calcified right hilar and right paratracheal lymph nodes identified. ABDOMEN/PELVIS: Extensive hypermetabolic liver metastases are identified involving both lobes of liver. Index lesion within segment 4b measures 5.4 cm and has an SUV max 28.58. Index lesion within segment 7 measures 6.9 cm and has an SUV max of 28.17. Segment 8 lesion measures 4.4 cm within SUV max of 18.47. No abnormal uptake identified within the pancreas or spleen. The adrenal glands are unremarkable. No hypermetabolic lymph nodes identified within the abdomen. No hypermetabolic pelvic or inguinal lymph nodes identified. Within the prostate gland there is intense radiotracer uptake within SUV max of 9.01. Incidental CT findings: Extensive aortic atherosclerosis with branch vessel disease identified. SKELETON: There is widespread sclerotic bone metastases. Intense, heterogeneous and patchy uptake is identified within the axial and appendicular skeleton compatible with hypermetabolic bone metastases. For example, sclerotic metastasis within the right humeral head have an SUV max equal to 8.8. Sclerotic metastasis within the right iliac bone have an SUV max of 6.07. Focal area of intense uptake within the right femoral neck with corresponding sclerotic metastasis has an SUV max of 5.95. Incidental CT findings: none IMPRESSION:  Number 1. Examination is positive for extensive FDG avid, hypermetabolic liver metastases. 2. Widespread hypermetabolic sclerotic bone metastases. 3. Increased uptake within soft tissue in the prostate bed compatible with residual/recurrent hypermetabolic local tumor. 4. Hypermetabolic left-sided mediastinal and right hilar lymph nodes. Suspicious for nodal metastasis. 5. Aortic atherosclerosis and coronary artery atherosclerotic calcifications. Aortic Atherosclerosis (ICD10-I70.0). Electronically Signed   By: Queen Slough.D.  On: 12/11/2017 15:28    Labs:  CBC: Recent Labs    12/28/17 1437 12/29/17 0638 12/30/17 0708 01/04/18 0851  WBC 5.1 5.8 5.6 6.2  HGB 5.0* 7.2* 8.0* 8.5*  HCT 15.1* 20.4* 22.6* 25.2*  PLT 104* 87* 83* 78*    COAGS: Recent Labs    01/04/17 2235 01/04/18 0851  INR 0.94 1.11    BMP: Recent Labs    12/11/17 1340 12/28/17 1437 12/29/17 0638 12/30/17 0708  NA 136 138 141 142  K 3.9 3.6 4.4 3.7  CL 105 106 107 110  CO2 23 21* 24 26  GLUCOSE 63* 103* 146* 103*  BUN 16 32* 35* 33*  CALCIUM 8.5* 8.2* 8.0* 7.8*  CREATININE 0.90 2.13* 2.29* 1.68*  GFRNONAA >60 29* 27* 39*  GFRAA >60 34* 31* 45*    LIVER FUNCTION TESTS: Recent Labs    08/15/17 1105 11/15/17 0941 11/20/17 1342 12/28/17 1437  BILITOT 0.6 0.7 0.6 1.2  AST 31 36 36 50*  ALT 20 15* 18 22  ALKPHOS 323* 168* 183* 245*  PROT 7.1 6.6 7.2 7.3  ALBUMIN 3.4* 3.0* 3.0* 2.8*    TUMOR MARKERS: No results for input(s): AFPTM, CEA, CA199, CHROMGRNA in the last 8760 hours.  Assessment and Plan:  72 yo with metastatic prostate cancer and extensive disease in the liver.  Oncology has requested tissue sampling of a liver lesion.  Patient has atrial fibrillation but asymptomatic at this time.  Discussed US guided liver lesion biopsy with patient.  Risks including bleeding were discussed with patient and informed consent obtained.  However, I am not comfortable sending him home after the biopsy  without supervision and without access to a telephone.  I discussed with Dr. [name] and she agrees that he needs to stay in the hospital overnight for observation after the biopsy.  Patient is agreeable to this plan.  I spoke with Dr. Anselm Jungling with the Hospitalist Service who has graciously agreed to admit the patient after the biopsy.  Plan for US guided liver lesion biopsy.   Thank you for this interesting consult.  I greatly enjoyed meeting ABUBAKAR CRISPO and look forward to participating in their care.  A copy of this report was sent to the requesting provider on this date.  Electronically Signed: Burman Riis, MD 01/04/2018, 10:53 AM   I spent a total of  15 Minutes   in face to face in clinical consultation, greater than 50% of which was counseling/coordinating care for an US guided biopsy.

## 2018-01-04 NOTE — Progress Notes (Signed)
Patient post liver biopsy with DR Anselm Pancoast, dressing dry and intact, diastolic bp has been elevated, denies complaints. Dr Anselm Pancoast out to speak with patient regarding procedure with questions answered.

## 2018-01-04 NOTE — Progress Notes (Signed)
   01/04/18 1610  Clinical Encounter Type  Visited With Patient not available  Visit Type Initial (advanced directive order)  Referral From Physician  Consult/Referral To Chaplain   Patient working with other staff members, will attempt follow up at later time regarding advanced directives.

## 2018-01-04 NOTE — Progress Notes (Signed)
Dr Molli Hazard here earlier to evaluate patient for admission post biopsy, has been in afib with rate 115-150, consulted with Dr Molli Hazard ordered metoprolol 5mg  iv for htn as well as afib rate. After 60min's after dose iv given pt sat to side of bed to void, becoming diaphoretic,near syncope in nature. Back to bed with head of bed lowered, repeat bp 106/55, blood sugar rechecked and noted. Listless at first, thereafter back to baseline. Called Dr Molli Hazard who came and reevaluated. Noted bp 97/69, thus giving 262ml iv ns bolus at this time.

## 2018-01-05 ENCOUNTER — Observation Stay: Payer: Medicare Other

## 2018-01-05 DIAGNOSIS — I4891 Unspecified atrial fibrillation: Secondary | ICD-10-CM | POA: Diagnosis not present

## 2018-01-05 LAB — CBC
HCT: 16.8 % — ABNORMAL LOW (ref 40.0–52.0)
HEMOGLOBIN: 5.8 g/dL — AB (ref 13.0–18.0)
MCH: 33 pg (ref 26.0–34.0)
MCHC: 34.6 g/dL (ref 32.0–36.0)
MCV: 95.3 fL (ref 80.0–100.0)
Platelets: 69 10*3/uL — ABNORMAL LOW (ref 150–440)
RBC: 1.76 MIL/uL — AB (ref 4.40–5.90)
RDW: 17.3 % — ABNORMAL HIGH (ref 11.5–14.5)
WBC: 4.5 10*3/uL (ref 3.8–10.6)

## 2018-01-05 LAB — BASIC METABOLIC PANEL
Anion gap: 7 (ref 5–15)
BUN: 21 mg/dL (ref 8–23)
CALCIUM: 7.7 mg/dL — AB (ref 8.9–10.3)
CO2: 28 mmol/L (ref 22–32)
CREATININE: 1.1 mg/dL (ref 0.61–1.24)
Chloride: 109 mmol/L (ref 98–111)
GFR calc Af Amer: >60 mL/min (ref >60)
GFR calc non Af Amer: >60 mL/min (ref >60)
GLUCOSE: 94 mg/dL (ref 70–99)
POTASSIUM: 4.1 mmol/L (ref 3.5–5.1)
Sodium: 144 mmol/L (ref 135–145)

## 2018-01-05 LAB — PREPARE RBC (CROSSMATCH)

## 2018-01-05 LAB — HEMOGLOBIN
Hemoglobin: 5.8 g/dL — ABNORMAL LOW (ref 13.0–18.0)
Hemoglobin: 7.2 g/dL — ABNORMAL LOW (ref 13.0–18.0)

## 2018-01-05 MED ORDER — METOPROLOL TARTRATE 50 MG PO TABS
75.0000 mg | ORAL_TABLET | Freq: Two times a day (BID) | ORAL | Status: DC
  Administered 2018-01-05 – 2018-01-06 (×3): 75 mg via ORAL
  Filled 2018-01-05 (×3): qty 1

## 2018-01-05 MED ORDER — AMIODARONE HCL 200 MG PO TABS
200.0000 mg | ORAL_TABLET | Freq: Two times a day (BID) | ORAL | Status: DC
  Administered 2018-01-05 – 2018-01-06 (×3): 200 mg via ORAL
  Filled 2018-01-05 (×3): qty 1

## 2018-01-05 MED ORDER — SODIUM CHLORIDE 0.9% IV SOLUTION: Freq: Once | INTRAVENOUS | Status: DC

## 2018-01-05 NOTE — Progress Notes (Signed)
Patient Name: Michael Mathews Date of Encounter: 01/05/2018  Hospital Problem List     Active Problems:   Atrial fibrillation with RVR [facility])    Patient Profile     72 year old male with history of atrial fibrillation currently on amiodarone who underwent liver biopsy.  He was noted to have atrial fibrillation.  He was admitted for rate control.  He initially was fairly tachycardic placed on a IV Cardizem drip.  Rate is improved.  Hemoglobin is dropped significantly overnight.  Subjective   No complaints.  Inpatient Medications    . sodium chloride   Intravenous Once  . amiodarone  200 mg Oral BID  . atorvastatin  40 mg Oral Daily  . DULoxetine  20 mg Oral Daily  . ferrous sulfate  325 mg Oral BID WC  . finasteride  5 mg Oral Daily  . metoprolol tartrate  75 mg Oral BID  . mometasone-formoterol  2 puff Inhalation BID  . OLANZapine  10 mg Oral QHS  . pantoprazole  40 mg Oral Daily  . tamsulosin  0.4 mg Oral Daily  . vitamin B-12  1,000 mcg Oral Daily    Vital Signs    Vitals:   01/05/18 0411 01/05/18 0806 01/05/18 1133 01/05/18 1151  BP: 138/80 (!) 153/76 137/74 130/68  Pulse: 99 74 82 95  Resp:  18 18 18   Temp:  98.1 F (36.7 C) 97.7 F (36.5 C) 98.2 F (36.8 C)  TempSrc:  Oral    SpO2:  98% 97% 97%  Weight:      Height:        Intake/Output Summary (Last 24 hours) at 01/05/2018 1408 Last data filed at 01/05/2018 1136 Gross per 24 hour  Intake 560 ml  Output 425 ml  Net 135 ml   Filed Weights   01/04/18 0854  Weight: 99.3 kg (219 lb)    Physical Exam    GEN: Well nourished, well developed, in no acute distress.  HEENT: normal.  Neck: Supple, no JVD, carotid bruits, or masses. Cardiac: RRR, no murmurs, rubs, or gallops. No clubbing, cyanosis, edema.  Radials/DP/PT 2+ and equal bilaterally.  Respiratory:  Respirations regular and unlabored, clear to auscultation bilaterally. GI: Soft, nontender, nondistended, BS + x 4. MS: no deformity or  atrophy. Skin: warm and dry, no rash. Neuro:  Strength and sensation are intact. Psych: Normal affect.  Labs    CBC Recent Labs    01/04/18 0851 01/05/18 0402 01/05/18 1011  WBC 6.2 4.5  --   HGB 8.5* 5.8* 5.8*  HCT 25.2* 16.8*  --   MCV 96.3 95.3  --   PLT 78* 69*  --    Basic Metabolic Panel Recent Labs    01/04/18 1607 01/05/18 0402  NA 140 144  K 4.0 4.1  CL 106 109  CO2 27 28  GLUCOSE 113* 94  BUN 18 21  CREATININE 1.05 1.10  CALCIUM 7.8* 7.7*   Liver Function Tests No results for input(s): AST, ALT, ALKPHOS, BILITOT, PROT, ALBUMIN in the last 72 hours. No results for input(s): LIPASE, AMYLASE in the last 72 hours. Cardiac Enzymes No results for input(s): CKTOTAL, CKMB, CKMBINDEX, TROPONINI in the last 72 hours. BNP No results for input(s): BNP in the last 72 hours. D-Dimer No results for input(s): DDIMER in the last 72 hours. Hemoglobin A1C No results for input(s): HGBA1C in the last 72 hours. Fasting Lipid Panel No results for input(s): CHOL, HDL, LDLCALC, TRIG, CHOLHDL, LDLDIRECT in the  last 72 hours. Thyroid Function Tests Recent Labs    01/04/18 1607  TSH 1.280    Telemetry    Atrial fibrillation with variable but fairly rapid ventricular response  ECG    Atrial fibrillation with variable ventricular response  Radiology    US Renal  Result Date: 12/29/2017 CLINICAL DATA:  Acute renal insufficiency. History of prostate carcinoma EXAM: RENAL / URINARY TRACT ULTRASOUND COMPLETE COMPARISON:  PET-CT December 11, 2017 FINDINGS: Right Kidney: Length: 9.4 cm. Echogenicity and renal cortical thickness are within normal limits. No mass, perinephric fluid, or hydronephrosis visualized. There is mild scarring in the upper pole right kidney. No sonographically demonstrable calculus or ureterectasis. Left Kidney: Length: 10.1 cm. Echogenicity and renal cortical thickness are within normal limits. No perinephric fluid or hydronephrosis visualized. There is a  cyst in the lower pole left kidney measuring 0.9 x 1.3 x 1.1 cm. No sonographically demonstrable calculus or ureterectasis. Bladder: Appears normal for degree of bladder distention. IMPRESSION: Slight scarring upper pole right kidney. Small cyst lower pole left kidney. Study otherwise unremarkable. In particular, no obstructing focus in either kidney. Renal cortical thickness and echogenicity are within normal limits bilaterally. Electronically Signed   By: Lowella Grip III M.D.   On: 12/29/2017 17:54   Nm Pet Image Initial (pi) Skull Base To Thigh  Result Date: 12/11/2017 CLINICAL DATA:  Subsequent treatment strategy for metastatic prostate cancer. EXAM: NUCLEAR MEDICINE PET SKULL BASE TO THIGH TECHNIQUE: 11.9 mCi F-18 FDG was injected intravenously. Full-ring PET imaging was performed from the skull base to thigh after the radiotracer. CT data was obtained and used for attenuation correction and anatomic localization. Fasting blood glucose: 41 mg/dl COMPARISON:  Nuclear medicine bone scan 05/15/2017 and CT CAP 05/15/2017 FINDINGS: Mediastinal blood pool activity: SUV max 1.9 NECK: No hypermetabolic lymph nodes in the neck. Incidental CT findings: none CHEST: Left-sided pre-vascular lymph node measures 1.2 cm and has an SUV max of 3.8. Increased uptake within right hilar lymph node has an SUV max of 6.4. No pleural effusion identified. Two small nodules in the posterior left upper lobe are identified measuring up to 6 mm. These are too small to characterize by PET-CT. Small nodule in the lateral right upper lobe measures 3 mm, image 68/3. Also too small to characterize by PET-CT. Calcified granuloma noted within the superior segment of right lower lobe. Incidental CT findings: Aortic atherosclerosis. Calcifications in the LAD, left circumflex and RCA coronary artery noted. Calcified right hilar and right paratracheal lymph nodes identified. ABDOMEN/PELVIS: Extensive hypermetabolic liver metastases are  identified involving both lobes of liver. Index lesion within segment 4b measures 5.4 cm and has an SUV max 28.58. Index lesion within segment 7 measures 6.9 cm and has an SUV max of 28.17. Segment 8 lesion measures 4.4 cm within SUV max of 18.47. No abnormal uptake identified within the pancreas or spleen. The adrenal glands are unremarkable. No hypermetabolic lymph nodes identified within the abdomen. No hypermetabolic pelvic or inguinal lymph nodes identified. Within the prostate gland there is intense radiotracer uptake within SUV max of 9.01. Incidental CT findings: Extensive aortic atherosclerosis with branch vessel disease identified. SKELETON: There is widespread sclerotic bone metastases. Intense, heterogeneous and patchy uptake is identified within the axial and appendicular skeleton compatible with hypermetabolic bone metastases. For example, sclerotic metastasis within the right humeral head have an SUV max equal to 8.8. Sclerotic metastasis within the right iliac bone have an SUV max of 6.07. Focal area of intense uptake within the  right femoral neck with corresponding sclerotic metastasis has an SUV max of 5.95. Incidental CT findings: none IMPRESSION: Number 1. Examination is positive for extensive FDG avid, hypermetabolic liver metastases. 2. Widespread hypermetabolic sclerotic bone metastases. 3. Increased uptake within soft tissue in the prostate bed compatible with residual/recurrent hypermetabolic local tumor. 4. Hypermetabolic left-sided mediastinal and right hilar lymph nodes. Suspicious for nodal metastasis. 5. Aortic atherosclerosis and coronary artery atherosclerotic calcifications. Aortic Atherosclerosis (ICD10-I70.0). Electronically Signed   By: Kerby Moors M.D.   On: 12/11/2017 15:28   [place] Core Biopsy (liver)  Result Date: 01/04/2018 INDICATION: 72 year old male with metastatic prostate cancer. Patient has numerous liver lesions and request for liver lesion biopsy. EXAM:  ULTRASOUND-GUIDED LIVER LESION BIOPSY MEDICATIONS: None. ANESTHESIA/SEDATION: Moderate (conscious) sedation was employed during this procedure. A total of Versed 2 mg and Fentanyl 50 mcg was administered intravenously. Moderate Sedation Time: 10 minutes. The patient's level of consciousness and vital signs were monitored continuously by radiology nursing throughout the procedure under my direct supervision. FLUOROSCOPY TIME:  None COMPLICATIONS: None immediate. PROCEDURE: Informed written consent was obtained from the patient after a thorough discussion of the procedural risks, benefits and alternatives. All questions were addressed. A timeout was performed prior to the initiation of the procedure. Liver was evaluated with ultrasound. Lesion along the inferior right hepatic lobe was targeted for biopsy. The right side of the abdomen was prepped with chlorhexidine and a sterile field was created. Skin and soft tissues anesthetized with 1% lidocaine. 17 gauge coaxial needle directed into the liver and lesion with ultrasound guidance. Three core biopsies were obtained with an 18 gauge core device. Specimens placed in formalin. Gel-Foam slurry was injected through the 17 gauge needle as it was removed. Bandage placed over the puncture site. FINDINGS: Numerous hypoechoic and hyperechoic lesions throughout the liver. Hypoechoic lesion in the inferior right hepatic lobe was targeted. Biopsy needle was confirmed within the lesion. No significant bleeding or hematoma formation following the core biopsies. IMPRESSION: Ultrasound-guided core biopsies of a right hepatic lesion. Electronically Signed   By: Markus Daft M.D.   On: 01/04/2018 11:55   US Abdomen Limited Ruq  Result Date: 01/05/2018 CLINICAL DATA:  Bleeding post liver biopsy. EXAM: ULTRASOUND ABDOMEN LIMITED RIGHT UPPER QUADRANT COMPARISON:  PET-CT 12/11/2017 FINDINGS: Gallbladder: The gallbladder is decompressed. There is apparent gallbladder wall thickening of  up to 4 mm. No cholelithiasis. The sonographic Murphy's sign was recorded is negative. Common bile duct: Diameter: 7.1 mm Liver: Heterogeneous echotexture consistent with presence of multiple hepatic metastatic lesions. Portal vein is patent on color Doppler imaging with normal direction of blood flow towards the liver. There is a small amount of perihepatic ascites. IMPRESSION: Small amount of perihepatic ascites. Multiple hepatic metastatic lesions. Decompressed gallbladder with apparent gallbladder wall thickening of up to 4 mm. This may be reactive, due to the decompressed state of the gallbladder or represent acalculus cholecystitis. Electronically Signed   By: Fidela Salisbury M.D.   On: 01/05/2018 11:04    Assessment & Plan    Atrial fibrillation-rate is in better control.  Not a candidate for anticoagulation currently due to anemia and drop in hemoglobin.  Hemoglobin dropped to 5.8 in the last 24 hours.  Was 8.51-day ago.  Will discontinue IV Cardizem and placed on amiodarone at 200 mg twice daily.  We will continue with metoprolol we will increase this to 75 mg twice daily.  Work-up of anemia.  Anemia-etiology unclear.  Status post biopsy.  Will need to rule  out bleeding complication.  Sideration for transfusion.    Signed, Javier Docker Tea Collums MD 01/05/2018, 2:08 PM  Pager: [phone]

## 2018-01-05 NOTE — Care Management Note (Signed)
Case Management Note  Patient Details  Name: JAMIN PANTHER MRN: [id number] Date of Birth: Jan 10, 1946  Subjective/Objective:    Patient admitted to [facility] under observation status for atrial fibrillation with RVR. RNCM consulted on patient to provide MOON letter and complete assessment. Patient lives alone and is able to complete all activities if daily living without assistance. Has a walker in the home but does not often use it. Closest support person is a friend Odette Fraction [phone]. Patient still drives. On nocturnal oxygen via advanced home [facility]. [facility]CP is [facility] and he also obtains his medications from there without concern.                  Action/Plan:  RNCM to continue to follow for any needs.  Expected Discharge Date:                  Expected Discharge Plan:     In-House Referral:     Discharge planning Services     Post Acute Care Choice:    Choice offered to:     DME Arranged:    DME Agency:     HH Arranged:    HH Agency:     Status of Service:     If discussed at [employer], dates discussed:    Additional Comments:  Eamon Tantillo A Yesika Rispoli, RN 01/05/2018, 9:22 AM

## 2018-01-05 NOTE — Progress Notes (Signed)
Dr. Posey Pronto made aware that pt has converted to NSR

## 2018-01-05 NOTE — Progress Notes (Signed)
[facility]                                                                                                                                                                                  Patient Demographics   Michael Mathews, is a 72 y.o. male, DOB - [birth year], WUJ:[id number]  Admit date - 01/04/2018   Admitting Physician [name], MD  Outpatient Primary MD for the patient is Aycock, Ngwe A, MD   LOS - 0  Subjective: Patient's heart rate is improved however her hemoglobin dropped over last night Patient denies any abdominal pain he is denying any complaint   Review of Systems:   CONSTITUTIONAL: No documented fever. No fatigue, weakness. No weight gain, no weight loss.  EYES: No blurry or double vision.  ENT: No tinnitus. No postnasal drip. No redness of the oropharynx.  RESPIRATORY: No cough, no wheeze, no hemoptysis. No dyspnea.  CARDIOVASCULAR: No chest pain. No orthopnea. No palpitations. No syncope.  GASTROINTESTINAL: No nausea, no vomiting or diarrhea. No abdominal pain. No melena or hematochezia.  GENITOURINARY: No dysuria or hematuria.  ENDOCRINE: No polyuria or nocturia. No heat or cold intolerance.  HEMATOLOGY: No anemia. No bruising. No bleeding.  INTEGUMENTARY: No rashes. No lesions.  MUSCULOSKELETAL: No arthritis. No swelling. No gout.  NEUROLOGIC: No numbness, tingling, or ataxia. No seizure-type activity.  PSYCHIATRIC: No anxiety. No insomnia. No ADD.    Vitals:   Vitals:   01/05/18 0411 01/05/18 0806 01/05/18 1133 01/05/18 1151  BP: 138/80 (!) 153/76 137/74 130/68  Pulse: 99 74 82 95  Resp:  18 18 18   Temp:  98.1 F (36.7 C) 97.7 F (36.5 C) 98.2 F (36.8 C)  TempSrc:  Oral    SpO2:  98% 97% 97%  Weight:      Height:        Wt Readings from Last 3 Encounters:  01/04/18 99.3 kg (219 lb)  12/28/17 102.2 kg (225 lb 4.8 oz)  12/06/17 104.3 kg (230 lb)     Intake/Output Summary (Last 24 hours) at  01/05/2018 1416 Last data filed at 01/05/2018 1136 Gross per 24 hour  Intake 560 ml  Output 425 ml  Net 135 ml    Physical Exam:   GENERAL: Pleasant-appearing in no apparent distress.  HEAD, EYES, EARS, NOSE AND THROAT: Atraumatic, normocephalic. Extraocular muscles are intact. Pupils equal and reactive to light. Sclerae anicteric. No conjunctival injection. No oro-pharyngeal erythema.  NECK: Supple. There is no jugular venous distention. No bruits, no lymphadenopathy, no thyromegaly.  HEART: Regular rate and rhythm,. No murmurs, no rubs, no clicks.  LUNGS: Clear to auscultation bilaterally. No rales or  rhonchi. No wheezes.  ABDOMEN: Soft, flat, nontender, nondistended. Has good bowel sounds. No hepatosplenomegaly appreciated.  EXTREMITIES: No evidence of any cyanosis, clubbing, or peripheral edema.  +2 pedal and radial pulses bilaterally.  NEUROLOGIC: The patient is alert, awake, and oriented x3 with no focal motor or sensory deficits appreciated bilaterally.  SKIN: Moist and warm with no rashes appreciated.  Psych: Not anxious, depressed LN: No inguinal LN enlargement    Antibiotics   Anti-infectives (From admission, onward)   None      Medications   Scheduled Meds: . sodium chloride   Intravenous Once  . amiodarone  200 mg Oral BID  . atorvastatin  40 mg Oral Daily  . DULoxetine  20 mg Oral Daily  . ferrous sulfate  325 mg Oral BID WC  . finasteride  5 mg Oral Daily  . metoprolol tartrate  75 mg Oral BID  . mometasone-formoterol  2 puff Inhalation BID  . OLANZapine  10 mg Oral QHS  . pantoprazole  40 mg Oral Daily  . tamsulosin  0.4 mg Oral Daily  . vitamin B-12  1,000 mcg Oral Daily   Continuous Infusions: PRN Meds:.docusate sodium, nicotine, ondansetron, oxyCODONE   Data Review:   Micro Results No results found for this or any previous visit (from the past 240 hour(s)).  Radiology Reports US Renal  Result Date: 12/29/2017 CLINICAL DATA:  Acute renal  insufficiency. History of prostate carcinoma EXAM: RENAL / URINARY TRACT ULTRASOUND COMPLETE COMPARISON:  PET-CT December 11, 2017 FINDINGS: Right Kidney: Length: 9.4 cm. Echogenicity and renal cortical thickness are within normal limits. No mass, perinephric fluid, or hydronephrosis visualized. There is mild scarring in the upper pole right kidney. No sonographically demonstrable calculus or ureterectasis. Left Kidney: Length: 10.1 cm. Echogenicity and renal cortical thickness are within normal limits. No perinephric fluid or hydronephrosis visualized. There is a cyst in the lower pole left kidney measuring 0.9 x 1.3 x 1.1 cm. No sonographically demonstrable calculus or ureterectasis. Bladder: Appears normal for degree of bladder distention. IMPRESSION: Slight scarring upper pole right kidney. Small cyst lower pole left kidney. Study otherwise unremarkable. In particular, no obstructing focus in either kidney. Renal cortical thickness and echogenicity are within normal limits bilaterally. Electronically Signed   By: Lowella Grip III M.D.   On: 12/29/2017 17:54   Nm Pet Image Initial (pi) Skull Base To Thigh  Result Date: 12/11/2017 CLINICAL DATA:  Subsequent treatment strategy for metastatic prostate cancer. EXAM: NUCLEAR MEDICINE PET SKULL BASE TO THIGH TECHNIQUE: 11.9 mCi F-18 FDG was injected intravenously. Full-ring PET imaging was performed from the skull base to thigh after the radiotracer. CT data was obtained and used for attenuation correction and anatomic localization. Fasting blood glucose: 41 mg/dl COMPARISON:  Nuclear medicine bone scan 05/15/2017 and CT CAP 05/15/2017 FINDINGS: Mediastinal blood pool activity: SUV max 1.9 NECK: No hypermetabolic lymph nodes in the neck. Incidental CT findings: none CHEST: Left-sided pre-vascular lymph node measures 1.2 cm and has an SUV max of 3.8. Increased uptake within right hilar lymph node has an SUV max of 6.4. No pleural effusion identified. Two small  nodules in the posterior left upper lobe are identified measuring up to 6 mm. These are too small to characterize by PET-CT. Small nodule in the lateral right upper lobe measures 3 mm, image 68/3. Also too small to characterize by PET-CT. Calcified granuloma noted within the superior segment of right lower lobe. Incidental CT findings: Aortic atherosclerosis. Calcifications in the LAD, left circumflex and  RCA coronary artery noted. Calcified right hilar and right paratracheal lymph nodes identified. ABDOMEN/PELVIS: Extensive hypermetabolic liver metastases are identified involving both lobes of liver. Index lesion within segment 4b measures 5.4 cm and has an SUV max 28.58. Index lesion within segment 7 measures 6.9 cm and has an SUV max of 28.17. Segment 8 lesion measures 4.4 cm within SUV max of 18.47. No abnormal uptake identified within the pancreas or spleen. The adrenal glands are unremarkable. No hypermetabolic lymph nodes identified within the abdomen. No hypermetabolic pelvic or inguinal lymph nodes identified. Within the prostate gland there is intense radiotracer uptake within SUV max of 9.01. Incidental CT findings: Extensive aortic atherosclerosis with branch vessel disease identified. SKELETON: There is widespread sclerotic bone metastases. Intense, heterogeneous and patchy uptake is identified within the axial and appendicular skeleton compatible with hypermetabolic bone metastases. For example, sclerotic metastasis within the right humeral head have an SUV max equal to 8.8. Sclerotic metastasis within the right iliac bone have an SUV max of 6.07. Focal area of intense uptake within the right femoral neck with corresponding sclerotic metastasis has an SUV max of 5.95. Incidental CT findings: none IMPRESSION: Number 1. Examination is positive for extensive FDG avid, hypermetabolic liver metastases. 2. Widespread hypermetabolic sclerotic bone metastases. 3. Increased uptake within soft tissue in the  prostate bed compatible with residual/recurrent hypermetabolic local tumor. 4. Hypermetabolic left-sided mediastinal and right hilar lymph nodes. Suspicious for nodal metastasis. 5. Aortic atherosclerosis and coronary artery atherosclerotic calcifications. Aortic Atherosclerosis (ICD10-I70.0). Electronically Signed   By: Kerby Moors M.D.   On: 12/11/2017 15:28   [place] Core Biopsy (liver)  Result Date: 01/04/2018 INDICATION: 72 year old male with metastatic prostate cancer. Patient has numerous liver lesions and request for liver lesion biopsy. EXAM: ULTRASOUND-GUIDED LIVER LESION BIOPSY MEDICATIONS: None. ANESTHESIA/SEDATION: Moderate (conscious) sedation was employed during this procedure. A total of Versed 2 mg and Fentanyl 50 mcg was administered intravenously. Moderate Sedation Time: 10 minutes. The patient's level of consciousness and vital signs were monitored continuously by radiology nursing throughout the procedure under my direct supervision. FLUOROSCOPY TIME:  None COMPLICATIONS: None immediate. PROCEDURE: Informed written consent was obtained from the patient after a thorough discussion of the procedural risks, benefits and alternatives. All questions were addressed. A timeout was performed prior to the initiation of the procedure. Liver was evaluated with ultrasound. Lesion along the inferior right hepatic lobe was targeted for biopsy. The right side of the abdomen was prepped with chlorhexidine and a sterile field was created. Skin and soft tissues anesthetized with 1% lidocaine. 17 gauge coaxial needle directed into the liver and lesion with ultrasound guidance. Three core biopsies were obtained with an 18 gauge core device. Specimens placed in formalin. Gel-Foam slurry was injected through the 17 gauge needle as it was removed. Bandage placed over the puncture site. FINDINGS: Numerous hypoechoic and hyperechoic lesions throughout the liver. Hypoechoic lesion in the inferior right hepatic lobe was  targeted. Biopsy needle was confirmed within the lesion. No significant bleeding or hematoma formation following the core biopsies. IMPRESSION: Ultrasound-guided core biopsies of a right hepatic lesion. Electronically Signed   By: Markus Daft M.D.   On: 01/04/2018 11:55   US Abdomen Limited Ruq  Result Date: 01/05/2018 CLINICAL DATA:  Bleeding post liver biopsy. EXAM: ULTRASOUND ABDOMEN LIMITED RIGHT UPPER QUADRANT COMPARISON:  PET-CT 12/11/2017 FINDINGS: Gallbladder: The gallbladder is decompressed. There is apparent gallbladder wall thickening of up to 4 mm. No cholelithiasis. The sonographic Murphy's sign was recorded is negative. Common  bile duct: Diameter: 7.1 mm Liver: Heterogeneous echotexture consistent with presence of multiple hepatic metastatic lesions. Portal vein is patent on color Doppler imaging with normal direction of blood flow towards the liver. There is a small amount of perihepatic ascites. IMPRESSION: Small amount of perihepatic ascites. Multiple hepatic metastatic lesions. Decompressed gallbladder with apparent gallbladder wall thickening of up to 4 mm. This may be reactive, due to the decompressed state of the gallbladder or represent acalculus cholecystitis. Electronically Signed   By: Fidela Salisbury M.D.   On: 01/05/2018 11:04     CBC Recent Labs  Lab 12/30/17 0708 01/04/18 0851 01/05/18 0402 01/05/18 1011  WBC 5.6 6.2 4.5  --   HGB 8.0* 8.5* 5.8* 5.8*  HCT 22.6* 25.2* 16.8*  --   PLT 83* 78* 69*  --   MCV 94.0 96.3 95.3  --   MCH 33.2 32.5 33.0  --   MCHC 35.3 33.8 34.6  --   RDW 17.9* 17.8* 17.3*  --     Chemistries  Recent Labs  Lab 12/30/17 0708 01/04/18 1607 01/05/18 0402  NA 142 140 144  K 3.7 4.0 4.1  CL 110 106 109  CO2 26 27 28   GLUCOSE 103* 113* 94  BUN 33* 18 21  CREATININE 1.68* 1.05 1.10  CALCIUM 7.8* 7.8* 7.7*    ------------------------------------------------------------------------------------------------------------------ estimated creatinine clearance is 70.6 mL/min (by C-G formula based on SCr of 1.1 mg/dL). ------------------------------------------------------------------------------------------------------------------ No results for input(s): HGBA1C in the last 72 hours. ------------------------------------------------------------------------------------------------------------------ No results for input(s): CHOL, HDL, LDLCALC, TRIG, CHOLHDL, LDLDIRECT in the last 72 hours. ------------------------------------------------------------------------------------------------------------------ Recent Labs    01/04/18 1607  TSH 1.280   ------------------------------------------------------------------------------------------------------------------ No results for input(s): VITAMINB12, FOLATE, FERRITIN, TIBC, IRON, RETICCTPCT in the last 72 hours.  Coagulation profile Recent Labs  Lab 01/04/18 0851  INR 1.11    No results for input(s): DDIMER in the last 72 hours.  Cardiac Enzymes No results for input(s): CKMB, TROPONINI, MYOGLOBIN in the last 168 hours.  Invalid input(s): CK ------------------------------------------------------------------------------------------------------------------ Invalid input(s): [place]  Patient 72 year old admitted after a biopsy noted to be in A. fib A. fib with RVR  * Acute blood loss anemia Possibly due to bleeding related to his biopsy Patient is scheduled to get transfused we will monitor hemoglobin closely Ultrasound of the liver shows no evidence of bleeding    *A. fib with RVR heart rate improved Change to oral medications   *Metastatic prostate cancer with mets to liver and bone Liver biopsy is done today and patient follows at cancer center. Prognosis is poor and I would suggest palliative care and  consult.  *Vasovagal syncopal episode   This happened after injection metoprolol in the recovery room. Monitor on telemetry.  *Benign gastric hypertrophy Continue finasteride and Flomax.  *Essential hypertension Continue with metoprolol and amiodarone.  *Anemia Likely due to chronic disease, cancer Monitor.  *Acute renal insufficiency Check BMP now.        Code Status Orders  (From admission, onward)        Start     Ordered   01/04/18 1353  Full code  Continuous     01/04/18 1352    Code Status History    Date Active Date Inactive Code Status Order ID Comments User Context   12/28/2017 1740 12/30/2017 1813 Full Code [id number]  Loletha Grayer, MD Inpatient   01/19/2017 0615 01/19/2017 1954 Full Code [id number]  Harrie Foreman, MD ED   10/18/2016 1340  10/19/2016 1648 Full Code [id number]  Hollice Espy, MD Inpatient   03/23/2016 2017 03/24/2016 1501 DNR [id number]  Loletha Grayer, MD ED   03/11/2016 1341 03/14/2016 1606 DNR [id number]  Bettey Costa, MD ED   03/03/2016 1536 03/04/2016 0706 DNR [id number]  Bettey Costa, MD Inpatient    Advance Directive Documentation     Most Recent Value  Type of Advance Directive  Living will  Pre-existing out of facility DNR order (yellow form or pink MOST form)  -  "MOST" Form in Place?  -           Consults  cardiology  DVT Prophylaxis  scd's  Lab Results  Component Value Date   PLT 69 (L) 01/05/2018     Time Spent in minutes   84min Greater than 50% of time spent in care coordination and counseling patient regarding the condition and plan of care.   Dustin Flock M.D on 01/05/2018 at 2:16 PM  Between 7am to 6pm - Pager - [phone]  After 6pm go to [email or web address] - Proofreader  [employer]   Office  [phone]

## 2018-01-05 NOTE — Care Management Obs Status (Signed)
[facility] NOTIFICATION   Patient Details  Name: KEVION FATHEREE MRN: [id number] Date of Birth: Apr 09, 1946   Medicare Observation Status Notification Given:  Yes    Talisa Petrak A Marcedes Tech, RN 01/05/2018, 9:21 AM

## 2018-01-06 DIAGNOSIS — I4891 Unspecified atrial fibrillation: Secondary | ICD-10-CM | POA: Diagnosis not present

## 2018-01-06 LAB — TYPE AND SCREEN
ABO/RH(D): A POS
Antibody Screen: NEGATIVE
Unit division: 0
Unit division: 0
Unit division: 0

## 2018-01-06 LAB — BPAM RBC
Blood Product Expiration Date: 201908042359
Blood Product Expiration Date: 201908042359
Blood Product Expiration Date: 201908052359
ISSUE DATE / TIME: 201907271132
ISSUE DATE / TIME: 201907271553
ISSUE DATE / TIME: 201907271928
Unit Type and Rh: 600
Unit Type and Rh: 600
Unit Type and Rh: 6200

## 2018-01-06 LAB — GLUCOSE, CAPILLARY: GLUCOSE-CAPILLARY: 100 mg/dL — AB (ref 70–99)

## 2018-01-06 LAB — HEMOGLOBIN AND HEMATOCRIT, BLOOD
HCT: 25.2 % — ABNORMAL LOW (ref 40.0–52.0)
Hemoglobin: 8.7 g/dL — ABNORMAL LOW (ref 13.0–18.0)

## 2018-01-06 LAB — HEMOGLOBIN: HEMOGLOBIN: 8.5 g/dL — AB (ref 13.0–18.0)

## 2018-01-06 MED ORDER — HYDRALAZINE HCL 20 MG/ML IJ SOLN: 10.0000 mg | Freq: Once | INTRAMUSCULAR | Status: DC

## 2018-01-06 MED ORDER — METOPROLOL TARTRATE 100 MG PO TABS: 100.0000 mg | ORAL_TABLET | Freq: Two times a day (BID) | ORAL | 0 refills | Status: DC

## 2018-01-06 MED ORDER — METOPROLOL TARTRATE 50 MG PO TABS: 100.0000 mg | ORAL_TABLET | Freq: Two times a day (BID) | ORAL | Status: DC

## 2018-01-06 MED ORDER — AMIODARONE HCL 200 MG PO TABS: 200.0000 mg | ORAL_TABLET | Freq: Two times a day (BID) | ORAL | 0 refills | Status: DC

## 2018-01-06 NOTE — Discharge Instructions (Signed)
Liver Biopsy, Care After °These instructions give you information on caring for yourself after your procedure. Your doctor may also give you more specific instructions. Call your doctor if you have any problems or questions after your procedure. °Follow these instructions at home: °· Rest at home for 1-2 days or as told by your doctor. °· Have someone stay with you for at least 24 hours. °· Do not do these things in the first 24 hours: °? Drive. °? Use machinery. °? Take care of other people. °? Sign legal documents. °? Take a bath or shower. °· There are many different ways to close and cover a cut (incision). For example, a cut can be closed with stitches, skin glue, or adhesive strips. Follow your doctor's instructions on: °? Taking care of your cut. °? Changing and removing your bandage (dressing). °? Removing whatever was used to close your cut. °· Do not drink alcohol in the first week. °· Do not lift more than 5 pounds or play contact sports for the first 2 weeks. °· Take medicines only as told by your doctor. For 1 week, do not take medicine that has aspirin in it or medicines like ibuprofen. °· Get your test results. °Contact a doctor if: °· A cut bleeds and leaves more than just a small spot of blood. °· A cut is red, puffs up (swells), or hurts more than before. °· Fluid or something else comes from a cut. °· A cut smells bad. °· You have a fever or chills. °Get help right away if: °· You have swelling, bloating, or pain in your belly (abdomen). °· You get dizzy or faint. °· You have a rash. °· You feel sick to your stomach (nauseous) or throw up (vomit). °· You have trouble breathing, feel short of breath, or feel faint. °· Your chest hurts. °· You have problems talking or seeing. °· You have trouble balancing or moving your arms or legs. °This information is not intended to replace advice given to you by your health care provider. Make sure you discuss any questions you have with your health care  provider. °Document Released: 03/07/2008 Document Revised: 11/04/2015 Document Reviewed: 07/25/2013 °Elsevier Interactive Patient Education © 2018 [employer] ° °

## 2018-01-06 NOTE — Discharge Summary (Signed)
[name], 72 y.o., DOB Mar 11, 1946, MRN [id number]. Admission date: 01/04/2018 Discharge Date 01/06/2018 Primary MD [name], MD Admitting Physician [name], MD  Admission Diagnosis  Atrial fibrillation with RVR [facility]) [I48.91]  Discharge Diagnosis   Active Problems:   Atrial fibrillation with RVR ([place]) Acute blood loss anemia on chronic anemia Metastatic prostate cancer with mets to the liver Vasovagal syncopal episode BPH Essential hypertension Anemia   Hospital Course  Patient 72 year old who was admitted after a liver biopsy because his heart rate was elevated he was noticed to be in A. fib with RVR.  Patient was admitted started on a drip.  With much improvement in his heart rate.  Patient next day however was noted to have a drop in his hemoglobin.  He received transfusion.  Ultrasound showed no evidence of bleed.  There was concern that there may be dilutional drop in his hemoglobin.  Patient currently is doing well.  I recommend him having his hemoglobin checked with Dr. [name] in 2 days.            Consults  cardiology  Significant Tests:  See full reports for all details     US Renal  Result Date: 12/29/2017 CLINICAL DATA:  Acute renal insufficiency. History of prostate carcinoma EXAM: RENAL / URINARY TRACT ULTRASOUND COMPLETE COMPARISON:  PET-CT December 11, 2017 FINDINGS: Right Kidney: Length: 9.4 cm. Echogenicity and renal cortical thickness are within normal limits. No mass, perinephric fluid, or hydronephrosis visualized. There is mild scarring in the upper pole right kidney. No sonographically demonstrable calculus or ureterectasis. Left Kidney: Length: 10.1 cm. Echogenicity and renal cortical thickness are within normal limits. No perinephric fluid or hydronephrosis visualized. There is a cyst in the lower pole left kidney measuring 0.9 x 1.3 x 1.1 cm. No sonographically demonstrable calculus or  ureterectasis. Bladder: Appears normal for degree of bladder distention. IMPRESSION: Slight scarring upper pole right kidney. Small cyst lower pole left kidney. Study otherwise unremarkable. In particular, no obstructing focus in either kidney. Renal cortical thickness and echogenicity are within normal limits bilaterally. Electronically Signed   By: Lowella Grip III M.D.   On: 12/29/2017 17:54   Nm Pet Image Initial (pi) Skull Base To Thigh  Result Date: 12/11/2017 CLINICAL DATA:  Subsequent treatment strategy for metastatic prostate cancer. EXAM: NUCLEAR MEDICINE PET SKULL BASE TO THIGH TECHNIQUE: 11.9 mCi F-18 FDG was injected intravenously. Full-ring PET imaging was performed from the skull base to thigh after the radiotracer. CT data was obtained and used for attenuation correction and anatomic localization. Fasting blood glucose: 41 mg/dl COMPARISON:  Nuclear medicine bone scan 05/15/2017 and CT CAP 05/15/2017 FINDINGS: Mediastinal blood pool activity: SUV max 1.9 NECK: No hypermetabolic lymph nodes in the neck. Incidental CT findings: none CHEST: Left-sided pre-vascular lymph node measures 1.2 cm and has an SUV max of 3.8. Increased uptake within right hilar lymph node has an SUV max of 6.4. No pleural effusion identified. Two small nodules in the posterior left upper lobe are identified measuring up to 6 mm. These are too small to characterize by PET-CT. Small nodule in the lateral right upper lobe measures 3 mm, image 68/3. Also too small to characterize by PET-CT. Calcified granuloma noted within the superior segment of right lower lobe. Incidental CT findings: Aortic atherosclerosis. Calcifications in the LAD, left circumflex and RCA coronary artery noted. Calcified right hilar and right paratracheal lymph nodes identified. ABDOMEN/PELVIS: Extensive hypermetabolic liver  metastases are identified involving both lobes of liver. Index lesion within segment 4b measures 5.4 cm and has an SUV max 28.58.  Index lesion within segment 7 measures 6.9 cm and has an SUV max of 28.17. Segment 8 lesion measures 4.4 cm within SUV max of 18.47. No abnormal uptake identified within the pancreas or spleen. The adrenal glands are unremarkable. No hypermetabolic lymph nodes identified within the abdomen. No hypermetabolic pelvic or inguinal lymph nodes identified. Within the prostate gland there is intense radiotracer uptake within SUV max of 9.01. Incidental CT findings: Extensive aortic atherosclerosis with branch vessel disease identified. SKELETON: There is widespread sclerotic bone metastases. Intense, heterogeneous and patchy uptake is identified within the axial and appendicular skeleton compatible with hypermetabolic bone metastases. For example, sclerotic metastasis within the right humeral head have an SUV max equal to 8.8. Sclerotic metastasis within the right iliac bone have an SUV max of 6.07. Focal area of intense uptake within the right femoral neck with corresponding sclerotic metastasis has an SUV max of 5.95. Incidental CT findings: none IMPRESSION: Number 1. Examination is positive for extensive FDG avid, hypermetabolic liver metastases. 2. Widespread hypermetabolic sclerotic bone metastases. 3. Increased uptake within soft tissue in the prostate bed compatible with residual/recurrent hypermetabolic local tumor. 4. Hypermetabolic left-sided mediastinal and right hilar lymph nodes. Suspicious for nodal metastasis. 5. Aortic atherosclerosis and coronary artery atherosclerotic calcifications. Aortic Atherosclerosis (ICD10-I70.0). Electronically Signed   By: Kerby Moors M.D.   On: 12/11/2017 15:28   [place] Core Biopsy (liver)  Result Date: 01/04/2018 INDICATION: 72 year old male with metastatic prostate cancer. Patient has numerous liver lesions and request for liver lesion biopsy. EXAM: ULTRASOUND-GUIDED LIVER LESION BIOPSY MEDICATIONS: None. ANESTHESIA/SEDATION: Moderate (conscious) sedation was employed  during this procedure. A total of Versed 2 mg and Fentanyl 50 mcg was administered intravenously. Moderate Sedation Time: 10 minutes. The patient's level of consciousness and vital signs were monitored continuously by radiology nursing throughout the procedure under my direct supervision. FLUOROSCOPY TIME:  None COMPLICATIONS: None immediate. PROCEDURE: Informed written consent was obtained from the patient after a thorough discussion of the procedural risks, benefits and alternatives. All questions were addressed. A timeout was performed prior to the initiation of the procedure. Liver was evaluated with ultrasound. Lesion along the inferior right hepatic lobe was targeted for biopsy. The right side of the abdomen was prepped with chlorhexidine and a sterile field was created. Skin and soft tissues anesthetized with 1% lidocaine. 17 gauge coaxial needle directed into the liver and lesion with ultrasound guidance. Three core biopsies were obtained with an 18 gauge core device. Specimens placed in formalin. Gel-Foam slurry was injected through the 17 gauge needle as it was removed. Bandage placed over the puncture site. FINDINGS: Numerous hypoechoic and hyperechoic lesions throughout the liver. Hypoechoic lesion in the inferior right hepatic lobe was targeted. Biopsy needle was confirmed within the lesion. No significant bleeding or hematoma formation following the core biopsies. IMPRESSION: Ultrasound-guided core biopsies of a right hepatic lesion. Electronically Signed   By: Markus Daft M.D.   On: 01/04/2018 11:55   US Abdomen Limited Ruq  Result Date: 01/05/2018 CLINICAL DATA:  Bleeding post liver biopsy. EXAM: ULTRASOUND ABDOMEN LIMITED RIGHT UPPER QUADRANT COMPARISON:  PET-CT 12/11/2017 FINDINGS: Gallbladder: The gallbladder is decompressed. There is apparent gallbladder wall thickening of up to 4 mm. No cholelithiasis. The sonographic Murphy's sign was recorded is negative. Common bile duct: Diameter: 7.1 mm  Liver: Heterogeneous echotexture consistent with presence of multiple hepatic metastatic lesions.  Portal vein is patent on color Doppler imaging with normal direction of blood flow towards the liver. There is a small amount of perihepatic ascites. IMPRESSION: Small amount of perihepatic ascites. Multiple hepatic metastatic lesions. Decompressed gallbladder with apparent gallbladder wall thickening of up to 4 mm. This may be reactive, due to the decompressed state of the gallbladder or represent acalculus cholecystitis. Electronically Signed   By: Fidela Salisbury M.D.   On: 01/05/2018 11:04       Today   Subjective:   Michael Mathews patient feels well denies any complaints stable for discharge Objective:   Blood pressure (!) 171/83, pulse 66, temperature 98.1 F (36.7 C), temperature source Oral, resp. rate 18, height 5\' 10"  (1.778 m), weight 99.3 kg (219 lb), SpO2 95 %.  .  Intake/Output Summary (Last 24 hours) at 01/06/2018 1446 Last data filed at 01/06/2018 1034 Gross per 24 hour  Intake 710 ml  Output 300 ml  Net 410 ml    Exam VITAL SIGNS: Blood pressure (!) 171/83, pulse 66, temperature 98.1 F (36.7 C), temperature source Oral, resp. rate 18, height 5\' 10"  (1.778 m), weight 99.3 kg (219 lb), SpO2 95 %.  GENERAL:  72 y.o.-year-old patient lying in the bed with no acute distress.  EYES: Pupils equal, round, reactive to light and accommodation. No scleral icterus. Extraocular muscles intact.  HEENT: Head atraumatic, normocephalic. Oropharynx and nasopharynx clear.  NECK:  Supple, no jugular venous distention. No thyroid enlargement, no tenderness.  LUNGS: Normal breath sounds bilaterally, no wheezing, rales,rhonchi or crepitation. No use of accessory muscles of respiration.  CARDIOVASCULAR: S1, S2 normal. No murmurs, rubs, or gallops.  ABDOMEN: Soft, nontender, nondistended. Bowel sounds present. No organomegaly or mass.  EXTREMITIES: No pedal edema, cyanosis, or clubbing.   NEUROLOGIC: Cranial nerves II through XII are intact. Muscle strength 5/5 in all extremities. Sensation intact. Gait not checked.  PSYCHIATRIC: The patient is alert and oriented x 3.  SKIN: No obvious rash, lesion, or ulcer.   Data Review     CBC w Diff:  Lab Results  Component Value Date   WBC 4.5 01/05/2018   HGB 8.5 (L) 01/06/2018   HCT 25.2 (L) 01/06/2018   PLT 69 (L) 01/05/2018   LYMPHOPCT 9 12/28/2017   BANDSPCT 7 12/05/2017   MONOPCT 10 12/28/2017   EOSPCT 1 12/28/2017   BASOPCT 1 12/28/2017   CMP:  Lab Results  Component Value Date   NA 144 01/05/2018   NA 140 11/28/2013   K 4.1 01/05/2018   K 3.7 11/28/2013   CL 109 01/05/2018   CL 107 11/28/2013   CO2 28 01/05/2018   CO2 27 11/28/2013   BUN 21 01/05/2018   BUN 16 11/28/2013   CREATININE 1.10 01/05/2018   CREATININE 1.34 (H) 11/28/2013   PROT 7.3 12/28/2017   ALBUMIN 2.8 (L) 12/28/2017   BILITOT 1.2 12/28/2017   ALKPHOS 245 (H) 12/28/2017   AST 50 (H) 12/28/2017   ALT 22 12/28/2017  .  Micro Results No results found for this or any previous visit (from the past 240 hour(s)).      Code Status Orders  (From admission, onward)        Start     Ordered   01/04/18 1353  Full code  Continuous     01/04/18 1352    Code Status History    Date Active Date Inactive Code Status Order ID Comments User Context   12/28/2017 1740 12/30/2017 1813 Full Code [id number]  Loletha Grayer, MD Inpatient   01/19/2017 0615 01/19/2017 1954 Full Code [id number]  Harrie Foreman, MD ED   10/18/2016 1340 10/19/2016 1648 Full Code [id number]  Hollice Espy, MD Inpatient   03/23/2016 2017 03/24/2016 1501 DNR [id number]  Loletha Grayer, MD ED   03/11/2016 1341 03/14/2016 1606 DNR [id number]  Bettey Costa, MD ED   03/03/2016 1536 03/04/2016 0706 DNR [id number]  Bettey Costa, MD Inpatient    Advance Directive Documentation     Most Recent Value  Type of Advance Directive  Living will  Pre-existing out of facility DNR order  (yellow form or pink MOST form)  -  "MOST" Form in Place?  -          Follow-up Information    Aycock, Ngwe A, MD In 7 days.   Specialty:  Family Medicine Why:  Appointment Time: Pt will have to make own appointment as she is D/C over weekend. Contact information: [place] [address] [phone]        Sindy Guadeloupe, MD In 2 days.   Specialty:  Oncology Why:  Appointment Time: Pt will have to make own appointment as she is D/C over weekend.f/u anemia Contact information: [place] [address] [phone]        Corey Skains, MD In 1 week.   Specialty:  Cardiology Why:  Appointment Time: Pt will have to make own appointment as she is D/C over weekend. Contact information: [address] [place] [place] [address] [phone]           Discharge Medications   Allergies as of 01/06/2018   No Known Allergies     Medication List    TAKE these medications   abiraterone acetate 250 MG tablet Commonly known as:  ZYTIGA Take 4 tablets (1,000 mg total) by mouth daily. Take on an empty stomach 1 hour before or 2 hours after a meal   amiodarone 200 MG tablet Commonly known as:  PACERONE Take 1 tablet (200 mg total) by mouth 2 (two) times daily. What changed:    how much to take  how to take this  when to take this  additional instructions   atorvastatin 40 MG tablet Commonly known as:  LIPITOR Take 1 tablet (40 mg total) by mouth daily.   DULoxetine 30 MG capsule Commonly known as:  CYMBALTA   ferrous sulfate 325 (65 FE) MG EC tablet Take 1 tablet (325 mg total) by mouth 2 (two) times daily with a meal.   finasteride 5 MG tablet Commonly known as:  PROSCAR Take 5 mg by mouth daily.   Fluticasone-Salmeterol 250-50 MCG/DOSE Aepb Commonly known as:  ADVAIR Inhale 1 puff into the lungs 2 (two) times daily.   ketoconazole 2 % cream Commonly known as:   NIZORAL Apply 1 application topically daily as needed.   metoprolol tartrate 100 MG tablet Commonly known as:  LOPRESSOR Take 1 tablet (100 mg total) by mouth 2 (two) times daily. What changed:    medication strength  how much to take   nicotine 14 mg/24hr patch Commonly known as:  NICODERM CQ - dosed in mg/24 hours Place 1 patch (14 mg total) onto the skin daily as needed (nicotine craving). Okay to substitute generic   OLANZapine 10 MG tablet Commonly known as:  ZYPREXA Take 1 tablet (10 mg total) by mouth at bedtime.   ondansetron 4 MG tablet Commonly known as:  ZOFRAN Take 1  tablet (4 mg total) by mouth every 8 (eight) hours as needed for nausea or vomiting.   pantoprazole 40 MG tablet Commonly known as:  PROTONIX Take 1 tablet (40 mg total) by mouth daily.   tamsulosin 0.4 MG Caps capsule Commonly known as:  FLOMAX Take 1 capsule (0.4 mg total) by mouth daily.   vitamin B-12 1000 MCG tablet Commonly known as:  CYANOCOBALAMIN Take 1 tablet (1,000 mcg total) by mouth daily.          Total Time in preparing paper work, data evaluation and todays exam - 26 minutes  Dustin Flock M.D on 01/06/2018 at 2:46 PM [place]  [phone]

## 2018-01-07 ENCOUNTER — Ambulatory Visit: Payer: Medicare Other | Admitting: Radiation Oncology

## 2018-01-08 ENCOUNTER — Inpatient Hospital Stay: Payer: Medicare Other

## 2018-01-08 ENCOUNTER — Inpatient Hospital Stay (HOSPITAL_BASED_OUTPATIENT_CLINIC_OR_DEPARTMENT_OTHER): Payer: Medicare Other | Admitting: Oncology

## 2018-01-08 ENCOUNTER — Encounter: Payer: Self-pay | Admitting: Oncology

## 2018-01-08 ENCOUNTER — Other Ambulatory Visit: Payer: Self-pay | Admitting: Oncology

## 2018-01-08 VITALS — BP 159/88 | HR 72 | Temp 98.1°F | Resp 18 | Ht 70.0 in | Wt 231.4 lb

## 2018-01-08 DIAGNOSIS — I129 Hypertensive chronic kidney disease with stage 1 through stage 4 chronic kidney disease, or unspecified chronic kidney disease: Secondary | ICD-10-CM

## 2018-01-08 DIAGNOSIS — C7951 Secondary malignant neoplasm of bone: Secondary | ICD-10-CM | POA: Diagnosis not present

## 2018-01-08 DIAGNOSIS — C779 Secondary and unspecified malignant neoplasm of lymph node, unspecified: Secondary | ICD-10-CM

## 2018-01-08 DIAGNOSIS — C787 Secondary malignant neoplasm of liver and intrahepatic bile duct: Secondary | ICD-10-CM

## 2018-01-08 DIAGNOSIS — C61 Malignant neoplasm of prostate: Secondary | ICD-10-CM

## 2018-01-08 DIAGNOSIS — D649 Anemia, unspecified: Secondary | ICD-10-CM

## 2018-01-08 DIAGNOSIS — D696 Thrombocytopenia, unspecified: Secondary | ICD-10-CM

## 2018-01-08 DIAGNOSIS — Z79899 Other long term (current) drug therapy: Secondary | ICD-10-CM

## 2018-01-08 DIAGNOSIS — C7A1 Malignant poorly differentiated neuroendocrine tumors: Secondary | ICD-10-CM

## 2018-01-08 DIAGNOSIS — N179 Acute kidney failure, unspecified: Secondary | ICD-10-CM

## 2018-01-08 LAB — CBC WITH DIFFERENTIAL/PLATELET
BASOS PCT: 1 %
Basophils Absolute: 0 10*3/uL (ref 0–0.1)
EOS ABS: 0.1 10*3/uL (ref 0–0.7)
Eosinophils Relative: 2 %
HCT: 22.6 % — ABNORMAL LOW (ref 40.0–52.0)
HEMOGLOBIN: 7.8 g/dL — AB (ref 13.0–18.0)
LYMPHS ABS: 0.4 10*3/uL — AB (ref 1.0–3.6)
Lymphocytes Relative: 11 %
MCH: 32.4 pg (ref 26.0–34.0)
MCHC: 34.4 g/dL (ref 32.0–36.0)
MCV: 94 fL (ref 80.0–100.0)
Monocytes Absolute: 0.6 10*3/uL (ref 0.2–1.0)
Monocytes Relative: 14 %
NEUTROS PCT: 72 %
Neutro Abs: 3 10*3/uL (ref 1.4–6.5)
Platelets: 65 10*3/uL — ABNORMAL LOW (ref 150–440)
RBC: 2.4 MIL/uL — AB (ref 4.40–5.90)
RDW: 17.3 % — ABNORMAL HIGH (ref 11.5–14.5)
WBC: 4.1 10*3/uL (ref 3.8–10.6)

## 2018-01-08 LAB — COMPREHENSIVE METABOLIC PANEL
ALT: 29 U/L (ref 0–44)
AST: 61 U/L — ABNORMAL HIGH (ref 15–41)
Albumin: 2.5 g/dL — ABNORMAL LOW (ref 3.5–5.0)
Alkaline Phosphatase: 283 U/L — ABNORMAL HIGH (ref 38–126)
Anion gap: 11 (ref 5–15)
BUN: 21 mg/dL (ref 8–23)
CALCIUM: 7.6 mg/dL — AB (ref 8.9–10.3)
CO2: 23 mmol/L (ref 22–32)
CREATININE: 1.27 mg/dL — AB (ref 0.61–1.24)
Chloride: 109 mmol/L (ref 98–111)
GFR, EST NON AFRICAN AMERICAN: 54 mL/min — AB (ref >60)
Glucose, Bld: 116 mg/dL — ABNORMAL HIGH (ref 70–99)
Potassium: 3.4 mmol/L — ABNORMAL LOW (ref 3.5–5.1)
Sodium: 143 mmol/L (ref 135–145)
Total Bilirubin: 0.9 mg/dL (ref 0.3–1.2)
Total Protein: 6.5 g/dL (ref 6.5–8.1)

## 2018-01-08 LAB — SAMPLE TO BLOOD BANK

## 2018-01-08 NOTE — Progress Notes (Signed)
Increase SOB  

## 2018-01-10 NOTE — Progress Notes (Signed)
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 Mets to bone , liver, LN and skin  Chief complaint/ Reason for visit- discuss PET and biopsy results  Heme/Onc history: 1. patient is 72 year old male with a past medical history significant for hypertension diabetes and COPD among other medical problems. He was admitted to the hospital in October 2017 with some symptoms of dizziness and abdominal pain. CT angiogram abdomen incidentally showed sclerotic metastatic disease throughout the visualized spine.   2. This was followed by an MRI of the lumbar spine which showed multifocal T1 and T2 hypointense lesions with areas of increased signal on STIR sequence are seen in multiple vertebral bodies. Largest lesions are in T11 T12 L1 and in the imaged sacrum. This is consistent with metastatic disease. Primary lesion is not identified. MRI brain showed no acute intracranial abnormality.   3. Patient was noted to have an elevated PSA and was referred to urology as an outpatient. He was recently seen by Dr. Tresa Moore on 05/29/2016. PSA was repeated at that time which came back elevated at 10.3. 3 months over the value was 10.81.  4. Bone scan on 07/07/16 showed : Widespread radiotracer uptake over the axial and appendicular skeleton as described compatible with metastatic disease and correlating with sclerotic lesions on Ct. CT chest on 07/07/16 showed: Scattered sclerotic osseous metastatic lesions throughout spine, pelvis, proximal femora, ribs, and manubrium. Prostatic enlargement with  thickened bladder wall question related to chronic bladder outlet obstruction.  Spiculated 12 x 11 x 7 mm LEFT upper lobe nodule question primary pulmonary neoplasm. Stable nonspecific small LEFT adrenal nodule. Single minimally enlarged AP window lymph node. BILATERAL inguinal hernias containing fat. 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 Recommend annual imaging followup by CTA or MRA.   5. Anemia work up from 06/20/16 was as follows: CBC showed white count of 5.2, H&H of 8.1/25 and a platelet count of 164. CMP showed elevated alkaline phosphatase of 391. Multiple myeloma panel showed normal quantitative immunoglobulins and no monoclonal M protein.Vitamin B12 level was low normal at 225. LDH was mildly elevated at 234. Ferritin was low normal at 27. Reticulocyte count was 2.4% inappropriately low for the degree of anemia. Haptoglobin was elevated at 273.  6. Patients case was discussed at tumor board and plan was to watch the lung nodule with scans as it was in a difficult area to biopsy. CT guided bone biopsy was obtained which showed metastatic prostate cancer for which he is on lupron  7. Given that he does not have visceral or lymph node metastasis, docetaxel/zytigawas not started. Also patient lives alone and does not have a good social support or means of communication. Those options to be consideredat progression.   8. Bone scan on 12/22/2016 showed significantly worsening osseous metastatic disease. Left upper lobe nodule was slightly smaller in size. Zytiga was added in setting of worsening bone disease along with prednisonein aug 2018. Lupron administered 02/13/2017.  9. Scans after 3 months of zytiga showed progression of bone mets. PSA remains under control. Case discussed at tumor board and plan was  to hold zytiga and proceed with [facility] for 6 months followed by repeat  scans. xofigo started in jan 2019. Patient received 6 doses of xofigo. Last dose on 11/21/17  10. He was then found to have 2 new skin lesions- one showed SCC. 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 PET/CT scan on 12/11/17 showed: 1. Examination is positive for extensive FDG avid, hypermetabolic liver metastases. 2. Widespread hypermetabolic sclerotic bone metastases. 3. Increased uptake within soft tissue in the prostate bed compatible with residual/recurrent hypermetabolic local tumor. 4. Hypermetabolic left-sided mediastinal and right hilar lymph nodes. Suspicious for nodal metastasis. 5. Aortic atherosclerosis and coronary artery atherosclerotic calcifications. Aortic Atherosclerosis (ICD10-I70.0).  12. Prelim path shows high grade neuroendocrine carcinoma positive for PSA suggestive of prostate primary   Interval history- post liver biopsy patient was hospitalized. He received 3 units of PRBC due to his ongoing anemia. Leg swelling has improved considerably. He still lives alone in a trailer. Has no phone or means of communication. Reports appetite is better. He is able to get around his house and hopes to go to grocery store. His neighbor has been helping him out as well.  ECOG PS- 2 Pain scale- 0 Opioid associated constipation- no  Review of systems- Review of Systems  Constitutional: Positive for malaise/fatigue. Negative for chills, fever and weight loss.  HENT: Negative for congestion, ear discharge and nosebleeds.   Eyes: Negative for blurred vision.  Respiratory: Negative for cough, hemoptysis, sputum production, shortness of breath and wheezing.   Cardiovascular: Negative for chest pain, palpitations, orthopnea and claudication.  Gastrointestinal: Negative for abdominal pain, blood in stool, constipation, diarrhea, heartburn, melena, nausea and vomiting.  Genitourinary: Negative for dysuria, flank pain,  frequency, hematuria and urgency.  Musculoskeletal: Positive for back pain. Negative for joint pain and myalgias.  Skin: Negative for rash.  Neurological: Negative for dizziness, tingling, focal weakness, seizures, weakness and headaches.  Endo/Heme/Allergies: Does not bruise/bleed easily.  Psychiatric/Behavioral: Negative for depression and suicidal ideas. The patient does not have insomnia.       No Known Allergies   Past Medical History:  Diagnosis Date  . Anemia   . Arthritis   . Cancer [facility])    prostate  . COPD (chronic obstructive pulmonary disease) ([place])   . Coronary artery disease   . Cough    chronic  . Diabetes mellitus without complication ([place])   . Edema   . Elevated PSA   . Headache   . Hypertension   . Orthopnea   . Oxygen deficit    o2 prn  . Prostate cancer ([place])   . Shortness of breath dyspnea   . Sleep apnea    cpap     Past Surgical History:  Procedure Laterality Date  . CARDIAC CATHETERIZATION  2014  . COLONOSCOPY    . CYSTOSCOPY W/ RETROGRADES Bilateral 10/18/2016   Procedure: CYSTOSCOPY WITH RETROGRADE PYELOGRAM;  Surgeon: Hollice Espy, MD;  Location: [facility] ORS;  Service: Urology;  Laterality: Bilateral;  . EYE SURGERY    . KNEE ARTHROSCOPY    . TRANSURETHRAL RESECTION OF BLADDER TUMOR N/A 10/18/2016   Procedure: TRANSURETHRAL RESECTION OF BLADDER TUMOR (TURBT);  Surgeon: Hollice Espy, MD;  Location: [facility] ORS;  Service: Urology;  Laterality: N/A;  . TRANSURETHRAL RESECTION OF PROSTATE N/A 10/18/2016   Procedure: TRANSURETHRAL RESECTION OF THE PROSTATE (TURP) CHANNEL TURP;  Surgeon: Hollice Espy, MD;  Location: [facility] ORS;  Service: Urology;  Laterality: N/A;    Social  History   Socioeconomic History  . Marital status: Single    Spouse name: Not on file  . Number of children: Not on file  . Years of education: Not on file  . Highest education level: Not on file  Occupational History  . Not on file  Social Needs  . Financial resource  strain: Not on file  . Food insecurity:    Worry: Not on file    Inability: Not on file  . Transportation needs:    Medical: Not on file    Non-medical: Not on file  Tobacco Use  . Smoking status: Current Every Day Smoker    Packs/day: 0.50    Years: 55.00    Pack years: 27.50    Types: Cigarettes  . Smokeless tobacco: Never Used  Substance and Sexual Activity  . Alcohol use: No  . Drug use: No  . Sexual activity: Not Currently  Lifestyle  . Physical activity:    Days per week: Not on file    Minutes per session: Not on file  . Stress: Not on file  Relationships  . Social connections:    Talks on phone: Not on file    Gets together: Not on file    Attends religious service: Not on file    Active member of club or organization: Not on file    Attends meetings of clubs or organizations: Not on file    Relationship status: Not on file  . Intimate partner violence:    Fear of current or ex partner: Not on file    Emotionally abused: Not on file    Physically abused: Not on file    Forced sexual activity: Not on file  Other Topics Concern  . Not on file  Social History Narrative  . Not on file    Family History  Problem Relation Age of Onset  . CVA Mother   . CAD Father      Current Outpatient Medications:  .  abiraterone Acetate (ZYTIGA) 250 MG tablet, Take 4 tablets (1,000 mg total) by mouth daily. Take on an empty stomach 1 hour before or 2 hours after a meal (Patient not taking: Reported on 12/28/2017), Disp: 120 tablet, Rfl: 3 .  amiodarone (PACERONE) 200 MG tablet, Take 1 tablet (200 mg total) by mouth 2 (two) times daily., Disp: 60 tablet, Rfl: 0 .  atorvastatin (LIPITOR) 40 MG tablet, Take 1 tablet (40 mg total) by mouth daily., Disp: 30 tablet, Rfl: 0 .  DULoxetine (CYMBALTA) 30 MG capsule, , Disp: , Rfl:  .  ferrous sulfate 325 (65 FE) MG EC tablet, Take 1 tablet (325 mg total) by mouth 2 (two) times daily with a meal., Disp: 60 tablet, Rfl: 3 .  finasteride  (PROSCAR) 5 MG tablet, Take 5 mg by mouth daily., Disp: , Rfl:  .  Fluticasone-Salmeterol (ADVAIR) 250-50 MCG/DOSE AEPB, Inhale 1 puff into the lungs 2 (two) times daily., Disp: , Rfl:  .  ketoconazole (NIZORAL) 2 % cream, Apply 1 application topically daily as needed. , Disp: , Rfl:  .  metoprolol tartrate (LOPRESSOR) 100 MG tablet, Take 1 tablet (100 mg total) by mouth 2 (two) times daily., Disp: 60 tablet, Rfl: 0 .  nicotine (NICODERM CQ - DOSED IN MG/24 HOURS) 14 mg/24hr patch, Place 1 patch (14 mg total) onto the skin daily as needed (nicotine craving). Okay to substitute generic, Disp: 28 patch, Rfl: 0 .  OLANZapine (ZYPREXA) 10 MG tablet, Take 1 tablet (10 mg  total) by mouth at bedtime., Disp: 30 tablet, Rfl: 0 .  ondansetron (ZOFRAN) 4 MG tablet, Take 1 tablet (4 mg total) by mouth every 8 (eight) hours as needed for nausea or vomiting., Disp: 20 tablet, Rfl: 0 .  pantoprazole (PROTONIX) 40 MG tablet, Take 1 tablet (40 mg total) by mouth daily., Disp: 30 tablet, Rfl: 0 .  tamsulosin (FLOMAX) 0.4 MG CAPS capsule, Take 1 capsule (0.4 mg total) by mouth daily., Disp: 90 capsule, Rfl: 3 .  vitamin B-12 (CYANOCOBALAMIN) 1000 MCG tablet, Take 1 tablet (1,000 mcg total) by mouth daily., Disp: 30 tablet, Rfl: 3  Physical exam:  Vitals:   01/08/18 1407  BP: (!) 159/88  Pulse: 72  Resp: 18  Temp: 98.1 F (36.7 C)  TempSrc: Tympanic  SpO2: 96%  Weight: 231 lb 6.4 oz (105 kg)  Height: 5' 10"  (1.778 m)   Physical Exam  Constitutional: He is oriented to person, place, and time. He appears well-developed and well-nourished.  He is elderly and frail. No acute distress  HENT:  Head: Normocephalic and atraumatic.  Eyes: Pupils are equal, round, and reactive to light. EOM are normal.  Neck: Normal range of motion.  Cardiovascular: Normal rate, regular rhythm and normal heart sounds.  Pulmonary/Chest: Effort normal and breath sounds normal.  Abdominal: Soft. Bowel sounds are normal.    Musculoskeletal: He exhibits edema (b/l +1).  Neurological: He is alert and oriented to person, place, and time.  Skin: Skin is warm and dry.     CMP Latest Ref Rng & Units 01/08/2018  Glucose 70 - 99 mg/dL 116(H)  BUN 8 - 23 mg/dL 21  Creatinine 0.61 - 1.24 mg/dL 1.27(H)  Sodium 135 - 145 mmol/L 143  Potassium 3.5 - 5.1 mmol/L 3.4(L)  Chloride 98 - 111 mmol/L 109  CO2 22 - 32 mmol/L 23  Calcium 8.9 - 10.3 mg/dL 7.6(L)  Total Protein 6.5 - 8.1 g/dL 6.5  Total Bilirubin 0.3 - 1.2 mg/dL 0.9  Alkaline Phos 38 - 126 U/L 283(H)  AST 15 - 41 U/L 61(H)  ALT 0 - 44 U/L 29   CBC Latest Ref Rng & Units 01/08/2018  WBC 3.8 - 10.6 K/uL 4.1  Hemoglobin 13.0 - 18.0 g/dL 7.8(L)  Hematocrit 40.0 - 52.0 % 22.6(L)  Platelets 150 - 440 K/uL 65(L)    No images are attached to the encounter.  US Renal  Result Date: 12/29/2017 CLINICAL DATA:  Acute renal insufficiency. History of prostate carcinoma EXAM: RENAL / URINARY TRACT ULTRASOUND COMPLETE COMPARISON:  PET-CT December 11, 2017 FINDINGS: Right Kidney: Length: 9.4 cm. Echogenicity and renal cortical thickness are within normal limits. No mass, perinephric fluid, or hydronephrosis visualized. There is mild scarring in the upper pole right kidney. No sonographically demonstrable calculus or ureterectasis. Left Kidney: Length: 10.1 cm. Echogenicity and renal cortical thickness are within normal limits. No perinephric fluid or hydronephrosis visualized. There is a cyst in the lower pole left kidney measuring 0.9 x 1.3 x 1.1 cm. No sonographically demonstrable calculus or ureterectasis. Bladder: Appears normal for degree of bladder distention. IMPRESSION: Slight scarring upper pole right kidney. Small cyst lower pole left kidney. Study otherwise unremarkable. In particular, no obstructing focus in either kidney. Renal cortical thickness and echogenicity are within normal limits bilaterally. Electronically Signed   By: Lowella Grip III M.D.   On: 12/29/2017  17:54   Nm Pet Image Initial (pi) Skull Base To Thigh  Result Date: 12/11/2017 CLINICAL DATA:  Subsequent treatment strategy for  metastatic prostate cancer. EXAM: NUCLEAR MEDICINE PET SKULL BASE TO THIGH TECHNIQUE: 11.9 mCi F-18 FDG was injected intravenously. Full-ring PET imaging was performed from the skull base to thigh after the radiotracer. CT data was obtained and used for attenuation correction and anatomic localization. Fasting blood glucose: 41 mg/dl COMPARISON:  Nuclear medicine bone scan 05/15/2017 and CT CAP 05/15/2017 FINDINGS: Mediastinal blood pool activity: SUV max 1.9 NECK: No hypermetabolic lymph nodes in the neck. Incidental CT findings: none CHEST: Left-sided pre-vascular lymph node measures 1.2 cm and has an SUV max of 3.8. Increased uptake within right hilar lymph node has an SUV max of 6.4. No pleural effusion identified. Two small nodules in the posterior left upper lobe are identified measuring up to 6 mm. These are too small to characterize by PET-CT. Small nodule in the lateral right upper lobe measures 3 mm, image 68/3. Also too small to characterize by PET-CT. Calcified granuloma noted within the superior segment of right lower lobe. Incidental CT findings: Aortic atherosclerosis. Calcifications in the LAD, left circumflex and RCA coronary artery noted. Calcified right hilar and right paratracheal lymph nodes identified. ABDOMEN/PELVIS: Extensive hypermetabolic liver metastases are identified involving both lobes of liver. Index lesion within segment 4b measures 5.4 cm and has an SUV max 28.58. Index lesion within segment 7 measures 6.9 cm and has an SUV max of 28.17. Segment 8 lesion measures 4.4 cm within SUV max of 18.47. No abnormal uptake identified within the pancreas or spleen. The adrenal glands are unremarkable. No hypermetabolic lymph nodes identified within the abdomen. No hypermetabolic pelvic or inguinal lymph nodes identified. Within the prostate gland there is intense  radiotracer uptake within SUV max of 9.01. Incidental CT findings: Extensive aortic atherosclerosis with branch vessel disease identified. SKELETON: There is widespread sclerotic bone metastases. Intense, heterogeneous and patchy uptake is identified within the axial and appendicular skeleton compatible with hypermetabolic bone metastases. For example, sclerotic metastasis within the right humeral head have an SUV max equal to 8.8. Sclerotic metastasis within the right iliac bone have an SUV max of 6.07. Focal area of intense uptake within the right femoral neck with corresponding sclerotic metastasis has an SUV max of 5.95. Incidental CT findings: none IMPRESSION: Number 1. Examination is positive for extensive FDG avid, hypermetabolic liver metastases. 2. Widespread hypermetabolic sclerotic bone metastases. 3. Increased uptake within soft tissue in the prostate bed compatible with residual/recurrent hypermetabolic local tumor. 4. Hypermetabolic left-sided mediastinal and right hilar lymph nodes. Suspicious for nodal metastasis. 5. Aortic atherosclerosis and coronary artery atherosclerotic calcifications. Aortic Atherosclerosis (ICD10-I70.0). Electronically Signed   By: Kerby Moors M.D.   On: 12/11/2017 15:28   [place] Core Biopsy (liver)  Result Date: 01/04/2018 INDICATION: 72 year old male with metastatic prostate cancer. Patient has numerous liver lesions and request for liver lesion biopsy. EXAM: ULTRASOUND-GUIDED LIVER LESION BIOPSY MEDICATIONS: None. ANESTHESIA/SEDATION: Moderate (conscious) sedation was employed during this procedure. A total of Versed 2 mg and Fentanyl 50 mcg was administered intravenously. Moderate Sedation Time: 10 minutes. The patient's level of consciousness and vital signs were monitored continuously by radiology nursing throughout the procedure under my direct supervision. FLUOROSCOPY TIME:  None COMPLICATIONS: None immediate. PROCEDURE: Informed written consent was obtained from  the patient after a thorough discussion of the procedural risks, benefits and alternatives. All questions were addressed. A timeout was performed prior to the initiation of the procedure. Liver was evaluated with ultrasound. Lesion along the inferior right hepatic lobe was targeted for biopsy. The right side of the abdomen  was prepped with chlorhexidine and a sterile field was created. Skin and soft tissues anesthetized with 1% lidocaine. 17 gauge coaxial needle directed into the liver and lesion with ultrasound guidance. Three core biopsies were obtained with an 18 gauge core device. Specimens placed in formalin. Gel-Foam slurry was injected through the 17 gauge needle as it was removed. Bandage placed over the puncture site. FINDINGS: Numerous hypoechoic and hyperechoic lesions throughout the liver. Hypoechoic lesion in the inferior right hepatic lobe was targeted. Biopsy needle was confirmed within the lesion. No significant bleeding or hematoma formation following the core biopsies. IMPRESSION: Ultrasound-guided core biopsies of a right hepatic lesion. Electronically Signed   By: Markus Daft M.D.   On: 01/04/2018 11:55   US Abdomen Limited Ruq  Result Date: 01/05/2018 CLINICAL DATA:  Bleeding post liver biopsy. EXAM: ULTRASOUND ABDOMEN LIMITED RIGHT UPPER QUADRANT COMPARISON:  PET-CT 12/11/2017 FINDINGS: Gallbladder: The gallbladder is decompressed. There is apparent gallbladder wall thickening of up to 4 mm. No cholelithiasis. The sonographic Murphy's sign was recorded is negative. Common bile duct: Diameter: 7.1 mm Liver: Heterogeneous echotexture consistent with presence of multiple hepatic metastatic lesions. Portal vein is patent on color Doppler imaging with normal direction of blood flow towards the liver. There is a small amount of perihepatic ascites. IMPRESSION: Small amount of perihepatic ascites. Multiple hepatic metastatic lesions. Decompressed gallbladder with apparent gallbladder wall  thickening of up to 4 mm. This may be reactive, due to the decompressed state of the gallbladder or represent acalculus cholecystitis. Electronically Signed   By: Fidela Salisbury M.D.   On: 01/05/2018 11:04     Assessment and plan- Patient is a 72 y.o. 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 Mets to bone , liver, LN and skin  Final pathology is currently pending.  I did speak to Dr. Reuel Derby over the phone who gave me a prelim reading that liver biopsy is consistent with high-grade neuroendocrine carcinoma.  It was also staining positive for PSA and likely to be prostatic primary.  This specimen looks different from his original bone biopsy and prostate biopsy which was done in 2018 which looked more like adenocarcinoma.  It is therefore likely that patient has evolved from an adenocarcinoma to a poorly differentiated neuroendocrine carcinoma.    Based on his PET CT scan which I have reviewed independently and discussed the findings with the patient,he has evidence of diffuse liver mets bone mets as well as metastases to lymph node and skin this unfortunately portends poor prognosis.. Currently patient is pancytopenic and is requiring blood transfusions almost every week.  He is also thrombocytopenic and his platelet counts are currently trending in the 60s.  His last dose of Xofigo was in June 2019.  It is unclear if his anemia and thrombocytopenia is secondary to prolonged effect of Xofigo versus bone marrow involvement from prostate cancer.  I am unable to offer any systemic chemotherapy with his present counts.  For now we will be getting a CBC with differential along with hold tube for possible 2 units of blood transfusion twice a week.  Will hold off on blood transfusion today  I will see him back in 2 weeks time.  If his anemia and thrombocytopenia improves, I will consider offering dose reduced palliative chemotherapy with  carboplatin and etoposide.  Chemotherapy in his case is challenging because he has other risk factors including old age, he lives alone in a trailer and has no means of communication  and no phone.    If his counts do not improve after 2 weeks chemotherapy would likely be associated with more harm than benefit and I will discuss considering hospice at that time.  Patient is in understanding of the plan   Total face to face encounter time for this patient visit was 30 min. >50% of the time was  spent in counseling and coordination of care.     Visit Diagnosis 1. Prostate cancer metastatic to bone ([place])   2. Anemia, unspecified type   3. Thrombocytopenia ([place])   4. Liver metastases ([place])   5. High grade neuroendocrine carcinoma ([place])      Dr. Randa Evens, MD, MPH [facility] [phone] 01/10/2018 8:29 AM

## 2018-01-11 ENCOUNTER — Encounter: Payer: Self-pay | Admitting: Oncology

## 2018-01-11 ENCOUNTER — Inpatient Hospital Stay: Payer: Medicare Other

## 2018-01-11 ENCOUNTER — Other Ambulatory Visit: Payer: Self-pay

## 2018-01-11 ENCOUNTER — Inpatient Hospital Stay (HOSPITAL_BASED_OUTPATIENT_CLINIC_OR_DEPARTMENT_OTHER): Payer: Medicare Other | Admitting: Oncology

## 2018-01-11 ENCOUNTER — Inpatient Hospital Stay: Payer: Medicare Other | Attending: Oncology

## 2018-01-11 ENCOUNTER — Telehealth: Payer: Self-pay | Admitting: *Deleted

## 2018-01-11 VITALS — BP 125/83 | Temp 97.9°F | Resp 18 | Ht 70.0 in | Wt 234.2 lb

## 2018-01-11 DIAGNOSIS — C7951 Secondary malignant neoplasm of bone: Secondary | ICD-10-CM | POA: Insufficient documentation

## 2018-01-11 DIAGNOSIS — D61818 Other pancytopenia: Secondary | ICD-10-CM | POA: Diagnosis not present

## 2018-01-11 DIAGNOSIS — C792 Secondary malignant neoplasm of skin: Secondary | ICD-10-CM | POA: Diagnosis not present

## 2018-01-11 DIAGNOSIS — C61 Malignant neoplasm of prostate: Secondary | ICD-10-CM | POA: Diagnosis present

## 2018-01-11 DIAGNOSIS — E8809 Other disorders of plasma-protein metabolism, not elsewhere classified: Secondary | ICD-10-CM | POA: Insufficient documentation

## 2018-01-11 DIAGNOSIS — F17210 Nicotine dependence, cigarettes, uncomplicated: Secondary | ICD-10-CM | POA: Insufficient documentation

## 2018-01-11 DIAGNOSIS — I482 Chronic atrial fibrillation, unspecified: Secondary | ICD-10-CM

## 2018-01-11 DIAGNOSIS — C787 Secondary malignant neoplasm of liver and intrahepatic bile duct: Secondary | ICD-10-CM | POA: Insufficient documentation

## 2018-01-11 DIAGNOSIS — Z5112 Encounter for antineoplastic immunotherapy: Secondary | ICD-10-CM | POA: Diagnosis present

## 2018-01-11 DIAGNOSIS — R509 Fever, unspecified: Secondary | ICD-10-CM | POA: Diagnosis present

## 2018-01-11 DIAGNOSIS — R740 Nonspecific elevation of levels of transaminase and lactic acid dehydrogenase [LDH]: Secondary | ICD-10-CM | POA: Insufficient documentation

## 2018-01-11 DIAGNOSIS — C779 Secondary and unspecified malignant neoplasm of lymph node, unspecified: Secondary | ICD-10-CM

## 2018-01-11 DIAGNOSIS — D649 Anemia, unspecified: Secondary | ICD-10-CM

## 2018-01-11 DIAGNOSIS — R600 Localized edema: Secondary | ICD-10-CM

## 2018-01-11 DIAGNOSIS — Z7189 Other specified counseling: Secondary | ICD-10-CM

## 2018-01-11 LAB — CBC
HCT: 24.8 % — ABNORMAL LOW (ref 40.0–52.0)
HEMOGLOBIN: 8.6 g/dL — AB (ref 13.0–18.0)
MCH: 32.6 pg (ref 26.0–34.0)
MCHC: 34.6 g/dL (ref 32.0–36.0)
MCV: 94.2 fL (ref 80.0–100.0)
PLATELETS: 70 10*3/uL — AB (ref 150–440)
RBC: 2.64 MIL/uL — ABNORMAL LOW (ref 4.40–5.90)
RDW: 17 % — AB (ref 11.5–14.5)
WBC: 6.6 10*3/uL (ref 3.8–10.6)

## 2018-01-11 LAB — SAMPLE TO BLOOD BANK

## 2018-01-11 MED ORDER — LIDOCAINE-PRILOCAINE 2.5-2.5 % EX CREA: TOPICAL_CREAM | CUTANEOUS | 3 refills | Status: DC

## 2018-01-11 NOTE — Progress Notes (Signed)
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 Mets to bone , liver, LN and skin  Chief complaint/ Reason for visit- sick visit for leg swelling fatigue  Heme/Onc history:  1. patient is 72 year old male with a past medical history significant for hypertension diabetes and COPD among other medical problems. He was admitted to the hospital in October 2017 with some symptoms of dizziness and abdominal pain. CT angiogram abdomen incidentally showed sclerotic metastatic disease throughout the visualized spine.   2. This was followed by an MRI of the lumbar spine which showed multifocal T1 and T2 hypointense lesions with areas of increased signal on STIR sequence are seen in multiple vertebral bodies. Largest lesions are in T11 T12 L1 and in the imaged sacrum. This is consistent with metastatic disease. Primary lesion is not identified. MRI brain showed no acute intracranial abnormality.   3. Patient was noted to have an elevated PSA and was referred to urology as an outpatient. He was recently seen by Dr. Tresa Moore on 05/29/2016. PSA was repeated at that time which came back elevated at 10.3. 3 months over the value was 10.81.  4. Bone scan on 07/07/16 showed : Widespread radiotracer uptake over the axial and appendicular skeleton as described compatible with metastatic disease and correlating with sclerotic lesions on Ct. CT chest on 07/07/16 showed: Scattered sclerotic osseous metastatic lesions throughout spine, pelvis, proximal femora, ribs, and manubrium. Prostatic enlargement  with thickened bladder wall question related to chronic bladder outlet obstruction.  Spiculated 12 x 11 x 7 mm LEFT upper lobe nodule question primary pulmonary neoplasm. Stable nonspecific small LEFT adrenal nodule. Single minimally enlarged AP window lymph node. BILATERAL inguinal hernias containing fat. 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 Recommend annual imaging followup by CTA or MRA.   5. Anemia work up from 06/20/16 was as follows: CBC showed white count of 5.2, H&H of 8.1/25 and a platelet count of 164. CMP showed elevated alkaline phosphatase of 391. Multiple myeloma panel showed normal quantitative immunoglobulins and no monoclonal M protein.Vitamin B12 level was low normal at 225. LDH was mildly elevated at 234. Ferritin was low normal at 27. Reticulocyte count was 2.4% inappropriately low for the degree of anemia. Haptoglobin was elevated at 273.  6. Patients case was discussed at tumor board and plan was to watch the lung nodule with scans as it was in a difficult area to biopsy. CT guided bone biopsy was obtained which showed metastatic prostate cancer for which he is on lupron  7. Given that he does not have visceral or lymph node metastasis, docetaxel/zytigawas not started. Also patient lives alone and does not have a good social support or means of communication. Those options to be consideredat progression.   8. Bone scan on 12/22/2016 showed significantly worsening osseous metastatic disease. Left upper lobe nodule was slightly smaller in size. Zytiga was added in setting of worsening bone disease along with prednisonein aug 2018. Lupron administered 02/13/2017.  9. Scans after 3 months of zytiga showed progression of bone mets. PSA remains under control. Case discussed at tumor board and  plan was to hold zytiga and proceed with [facility] for 6 months followed by repeat  scans. xofigo started in jan 2019. Patientreceived 6 doses of xofigo. Last dose on 11/21/17  10. He was then found to have 2 new skin lesions- one showed SCC. 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 PET/CT scan on 12/11/17 showed:1. Examination is positive for extensive FDG avid, hypermetabolic liver metastases. 2. Widespread hypermetabolic sclerotic bone metastases. 3. Increased uptake within soft tissue in the prostate bed compatible with residual/recurrent hypermetabolic local tumor. 4. Hypermetabolic left-sided mediastinal and right hilar lymph nodes. Suspicious for nodal metastasis. 5. Aortic atherosclerosis and coronary artery atherosclerotic calcifications. Aortic Atherosclerosis (ICD10-I70.0).  12. Prelim path shows high grade neuroendocrine carcinoma positive for PSA suggestive of prostate primary   Interval history- feels poorly. Leg swelling is gradually worsening again. Feels fatigued. Reports pain in his left shoulder since this morning  ECOG PS- 2 Pain scale- 7 Opioid associated constipation- no  Review of systems- Review of Systems  Constitutional: Positive for malaise/fatigue. Negative for chills, fever and weight loss.  HENT: Negative for congestion, ear discharge and nosebleeds.   Eyes: Negative for blurred vision.  Respiratory: Negative for cough, hemoptysis, sputum production, shortness of breath and wheezing.   Cardiovascular: Positive for leg swelling. Negative for chest pain, palpitations, orthopnea and claudication.  Gastrointestinal: Negative for abdominal pain, blood in stool, constipation, diarrhea, heartburn, melena, nausea and vomiting.  Genitourinary: Negative for dysuria, flank pain, frequency, hematuria and urgency.  Musculoskeletal: Negative for back pain, joint pain and myalgias.  Skin: Negative for rash.  Neurological: Negative for dizziness, tingling, focal weakness, seizures,  weakness and headaches.  Endo/Heme/Allergies: Does not bruise/bleed easily.  Psychiatric/Behavioral: Negative for depression and suicidal ideas. The patient does not have insomnia.       No Known Allergies   Past Medical History:  Diagnosis Date  . Anemia   . Arthritis   . Cancer [facility])    prostate  . COPD (chronic obstructive pulmonary disease) ([place])   . Coronary artery disease   . Cough    chronic  . Diabetes mellitus without complication ([place])   . Edema   . Elevated PSA   . Headache   . Hypertension   . Orthopnea   . Oxygen deficit    o2 prn  . Prostate cancer ([place])   . Shortness of breath dyspnea   . Sleep apnea    cpap     Past Surgical History:  Procedure Laterality Date  . CARDIAC CATHETERIZATION  2014  . COLONOSCOPY    . CYSTOSCOPY W/ RETROGRADES Bilateral 10/18/2016   Procedure: CYSTOSCOPY WITH RETROGRADE PYELOGRAM;  Surgeon: Hollice Espy, MD;  Location: [facility] ORS;  Service: Urology;  Laterality: Bilateral;  . EYE SURGERY    . KNEE ARTHROSCOPY    . TRANSURETHRAL RESECTION OF BLADDER TUMOR N/A 10/18/2016   Procedure: TRANSURETHRAL RESECTION OF BLADDER TUMOR (TURBT);  Surgeon: Hollice Espy, MD;  Location: [facility] ORS;  Service: Urology;  Laterality: N/A;  . TRANSURETHRAL RESECTION OF PROSTATE N/A 10/18/2016   Procedure: TRANSURETHRAL RESECTION OF THE PROSTATE (TURP) CHANNEL TURP;  Surgeon: Hollice Espy, MD;  Location: [facility] ORS;  Service: Urology;  Laterality: N/A;    Social History   Socioeconomic History  . Marital status: Single    Spouse name: Not on file  . Number of children: Not on file  . Years of education: Not on file  . Highest education level: Not on file  Occupational History  . Not on file  Social Needs  . Financial resource strain: Not on file  . Food insecurity:    Worry: Not on file    Inability: Not on file  . Transportation needs:    Medical: Not on file    Non-medical: Not on file  Tobacco Use  . Smoking status: Current Every  Day Smoker    Packs/day: 0.50    Years: 55.00    Pack years: 27.50    Types: Cigarettes  . Smokeless tobacco: Never Used  Substance and Sexual Activity  . Alcohol use: No  . Drug use: No  . Sexual activity: Not Currently  Lifestyle  . Physical activity:    Days per week: Not on file    Minutes per session: Not on file  . Stress: Not on file  Relationships  . Social connections:    Talks on phone: Not on file    Gets together: Not on file    Attends religious service: Not on file    Active member of club or organization: Not on file    Attends meetings of clubs or organizations: Not on file    Relationship status: Not on file  . Intimate partner violence:    Fear of current or ex partner: Not on file    Emotionally abused: Not on file    Physically abused: Not on file    Forced sexual activity: Not on file  Other Topics Concern  . Not on file  Social History Narrative  . Not on file    Family History  Problem Relation Age of Onset  . CVA Mother   . CAD Father      Current Outpatient Medications:  .  amiodarone (PACERONE) 200 MG tablet, Take 1 tablet (200 mg total) by mouth 2 (two) times daily., Disp: 60 tablet, Rfl: 0 .  atorvastatin (LIPITOR) 40 MG tablet, Take 1 tablet (40 mg total) by mouth daily., Disp: 30 tablet, Rfl: 0 .  DULoxetine (CYMBALTA) 30 MG capsule, , Disp: , Rfl:  .  ferrous sulfate 325 (65 FE) MG EC tablet, Take 1 tablet (325 mg total) by mouth 2 (two) times daily with a meal., Disp: 60 tablet, Rfl: 3 .  finasteride (PROSCAR) 5 MG tablet, Take 5 mg by mouth daily., Disp: , Rfl:  .  Fluticasone-Salmeterol (ADVAIR) 250-50 MCG/DOSE AEPB, Inhale 1 puff into the lungs 2 (two) times daily., Disp: , Rfl:  .  metoprolol tartrate (LOPRESSOR) 100 MG tablet, Take 1 tablet (100 mg total) by mouth 2 (two) times daily., Disp: 60 tablet, Rfl: 0 .  OLANZapine (ZYPREXA) 10 MG tablet, Take 1 tablet (10 mg total) by mouth at bedtime., Disp: 30 tablet, Rfl: 0 .   pantoprazole (PROTONIX) 40 MG tablet, Take 1 tablet (40 mg total) by mouth daily., Disp: 30 tablet, Rfl: 0 .  tamsulosin (FLOMAX) 0.4 MG CAPS capsule, Take 1 capsule (0.4 mg total) by mouth daily., Disp: 90 capsule, Rfl: 3 .  vitamin B-12 (CYANOCOBALAMIN) 1000 MCG tablet, Take 1 tablet (1,000 mcg total) by mouth daily., Disp: 30 tablet, Rfl: 3 .  ketoconazole (NIZORAL) 2 % cream, Apply 1 application topically daily as needed. , Disp: , Rfl:  .  lidocaine-prilocaine (EMLA) cream, Apply to affected area once, Disp: 30 g, Rfl: 3 .  nicotine (NICODERM CQ - DOSED IN MG/24 HOURS) 14 mg/24hr patch, Place 1 patch (14 mg total) onto the skin daily as needed (nicotine craving). Okay to substitute generic (  Patient not taking: Reported on 01/11/2018), Disp: 28 patch, Rfl: 0 .  ondansetron (ZOFRAN) 4 MG tablet, Take 1 tablet (4 mg total) by mouth every 8 (eight) hours as needed for nausea or vomiting. (Patient not taking: Reported on 01/11/2018), Disp: 20 tablet, Rfl: 0  Physical exam:  Vitals:   01/11/18 0919  BP: 125/83  Resp: 18  Temp: 97.9 F (36.6 C)  TempSrc: Tympanic  SpO2: 95%  Weight: 234 lb 3.2 oz (106.2 kg)  Height: 5' 10"  (1.778 m)   Physical Exam  Constitutional: He is oriented to person, place, and time.  Appears fatigued  HENT:  Head: Normocephalic and atraumatic.  Eyes: Pupils are equal, round, and reactive to light. EOM are normal.  Neck: Normal range of motion.  Cardiovascular: Normal heart sounds.  Tachycardic, irregular  Pulmonary/Chest: Effort normal and breath sounds normal.  Abdominal: Soft. Bowel sounds are normal.  Musculoskeletal: He exhibits edema (b/l+2).  Neurological: He is alert and oriented to person, place, and time.  Skin: Skin is warm and dry.     CMP Latest Ref Rng & Units 01/08/2018  Glucose 70 - 99 mg/dL 116(H)  BUN 8 - 23 mg/dL 21  Creatinine 0.61 - 1.24 mg/dL 1.27(H)  Sodium 135 - 145 mmol/L 143  Potassium 3.5 - 5.1 mmol/L 3.4(L)  Chloride 98 - 111  mmol/L 109  CO2 22 - 32 mmol/L 23  Calcium 8.9 - 10.3 mg/dL 7.6(L)  Total Protein 6.5 - 8.1 g/dL 6.5  Total Bilirubin 0.3 - 1.2 mg/dL 0.9  Alkaline Phos 38 - 126 U/L 283(H)  AST 15 - 41 U/L 61(H)  ALT 0 - 44 U/L 29   CBC Latest Ref Rng & Units 01/11/2018  WBC 3.8 - 10.6 K/uL 6.6  Hemoglobin 13.0 - 18.0 g/dL 8.6(L)  Hematocrit 40.0 - 52.0 % 24.8(L)  Platelets 150 - 440 K/uL 70(L)    No images are attached to the encounter.  US Renal  Result Date: 12/29/2017 CLINICAL DATA:  Acute renal insufficiency. History of prostate carcinoma EXAM: RENAL / URINARY TRACT ULTRASOUND COMPLETE COMPARISON:  PET-CT December 11, 2017 FINDINGS: Right Kidney: Length: 9.4 cm. Echogenicity and renal cortical thickness are within normal limits. No mass, perinephric fluid, or hydronephrosis visualized. There is mild scarring in the upper pole right kidney. No sonographically demonstrable calculus or ureterectasis. Left Kidney: Length: 10.1 cm. Echogenicity and renal cortical thickness are within normal limits. No perinephric fluid or hydronephrosis visualized. There is a cyst in the lower pole left kidney measuring 0.9 x 1.3 x 1.1 cm. No sonographically demonstrable calculus or ureterectasis. Bladder: Appears normal for degree of bladder distention. IMPRESSION: Slight scarring upper pole right kidney. Small cyst lower pole left kidney. Study otherwise unremarkable. In particular, no obstructing focus in either kidney. Renal cortical thickness and echogenicity are within normal limits bilaterally. Electronically Signed   By: Lowella Grip III M.D.   On: 12/29/2017 17:54   [place] Core Biopsy (liver)  Result Date: 01/04/2018 INDICATION: 72 year old male with metastatic prostate cancer. Patient has numerous liver lesions and request for liver lesion biopsy. EXAM: ULTRASOUND-GUIDED LIVER LESION BIOPSY MEDICATIONS: None. ANESTHESIA/SEDATION: Moderate (conscious) sedation was employed during this procedure. A total of Versed 2 mg and  Fentanyl 50 mcg was administered intravenously. Moderate Sedation Time: 10 minutes. The patient's level of consciousness and vital signs were monitored continuously by radiology nursing throughout the procedure under my direct supervision. FLUOROSCOPY TIME:  None COMPLICATIONS: None immediate. PROCEDURE: Informed written consent was obtained from the patient  after a thorough discussion of the procedural risks, benefits and alternatives. All questions were addressed. A timeout was performed prior to the initiation of the procedure. Liver was evaluated with ultrasound. Lesion along the inferior right hepatic lobe was targeted for biopsy. The right side of the abdomen was prepped with chlorhexidine and a sterile field was created. Skin and soft tissues anesthetized with 1% lidocaine. 17 gauge coaxial needle directed into the liver and lesion with ultrasound guidance. Three core biopsies were obtained with an 18 gauge core device. Specimens placed in formalin. Gel-Foam slurry was injected through the 17 gauge needle as it was removed. Bandage placed over the puncture site. FINDINGS: Numerous hypoechoic and hyperechoic lesions throughout the liver. Hypoechoic lesion in the inferior right hepatic lobe was targeted. Biopsy needle was confirmed within the lesion. No significant bleeding or hematoma formation following the core biopsies. IMPRESSION: Ultrasound-guided core biopsies of a right hepatic lesion. Electronically Signed   By: Markus Daft M.D.   On: 01/04/2018 11:55   US Abdomen Limited Ruq  Result Date: 01/05/2018 CLINICAL DATA:  Bleeding post liver biopsy. EXAM: ULTRASOUND ABDOMEN LIMITED RIGHT UPPER QUADRANT COMPARISON:  PET-CT 12/11/2017 FINDINGS: Gallbladder: The gallbladder is decompressed. There is apparent gallbladder wall thickening of up to 4 mm. No cholelithiasis. The sonographic Murphy's sign was recorded is negative. Common bile duct: Diameter: 7.1 mm Liver: Heterogeneous echotexture consistent with  presence of multiple hepatic metastatic lesions. Portal vein is patent on color Doppler imaging with normal direction of blood flow towards the liver. There is a small amount of perihepatic ascites. IMPRESSION: Small amount of perihepatic ascites. Multiple hepatic metastatic lesions. Decompressed gallbladder with apparent gallbladder wall thickening of up to 4 mm. This may be reactive, due to the decompressed state of the gallbladder or represent acalculus cholecystitis. Electronically Signed   By: Fidela Salisbury M.D.   On: 01/05/2018 11:04     Assessment and plan- Patient is a 72 y.o. 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 Mets to bone , liver, LN and skin. He is here for following issues:  1. Afib: ekg done in our office today shows afib with VR of 145/min. We got in touch with Dr. Nehemiah Massed who recommends going up to TID on metoprolol dosing. He will f/u with them next week. He is also on amiodarone.  2. Metastatic prostate cancer- he has widespread disease and overall his prognosis is poor. Discussed his case at tumor board yesterday. He is not presently a candidate for chemotherapy given his anemia and thrombocytopenia. Immunotherapy has been shown to work in small cell carcinoma of the lung. He has small cell of the prostate and opdivo is a reasonable consideration at this time.  Discussed risks and benefits of opdivo including all but not limited to diarrhea, pneumonitis, endocrinopathies and need to monitor liver and kidney functions.  Patient understands and agrees to proceed.  Treatment will be given with the palliative intent.  We will see if his insurance can approve Opdivo and if not we might have to see if he can get that on a compassionate basis.  I will tentatively see him next week for first cycle of Opdivo and also check a CBC CMP and a TSH on that day  3 leg swelling likely secondary to hypoalbuminemia from  advanced malignancy..  Continue to monitor  4.  Anemia and thrombocytopenia: Secondary to Xofigo versus bone marrow involvement with malignancy.  Today he does not need any blood transfusion.  I will see if he responds to [place] and I will hold off on bone marrow biopsy at this time but it is a potential consideration down the line.  It is a difficult situation for the patient.  He lives alone in a trailer and does not have a cell phone for communication.  He does not have any other family members or friends who can take care of him.  He has refused home-based palliative care in the past and is not ready for hospice yet.     Visit Diagnosis 1. Prostate cancer metastatic to bone ([place])   2. Goals of care, counseling/discussion   3. Chronic atrial fibrillation ([place])   4. Leg edema      Dr. Randa Evens, MD, MPH [facility] [phone] 01/11/2018 11:57 AM

## 2018-01-11 NOTE — Progress Notes (Signed)
Increase swelling to bilateral legs and ankle

## 2018-01-11 NOTE — Telephone Encounter (Signed)
Pt feeling sob on exertion, sats were ok but heart rate up and down. 104-124. Dr. [name] ordered EKG and it was done with Afib with RVR rate 145.  I called Dr. Nehemiah Massed and he was able to speak to me on the phone.  He was currently on amiodarone 200 mg 1 tablet bid, and then metoprolol 100 mg bid.  Dr. Nehemiah Massed asked about his heart rate and b/p and since b/p was good but rate is high he suggested to increase to tid with metoprolol.  I got him an appt to see Dr. Nehemiah Massed and it will be 8/9 at 9:30 and I will tell him about this appt when he comes next week.  When I went to talk to patient about the above meds. The patient got the amiodarone but did not get metoprolol.   He did have some pill packs that had some metoprolol in it but he has ran out.  He promises he will go and get the metoprolol today and start it bid since he has not been on it. I have discussed with Dr [name] about what Nehemiah Massed said vs what we did today in lieu of pt not having the med at the time of my conversation with md and pt

## 2018-01-12 LAB — SURGICAL PATHOLOGY

## 2018-01-14 ENCOUNTER — Encounter: Payer: Self-pay | Admitting: Radiation Oncology

## 2018-01-14 ENCOUNTER — Inpatient Hospital Stay: Payer: Medicare Other

## 2018-01-14 ENCOUNTER — Other Ambulatory Visit: Payer: Self-pay

## 2018-01-14 ENCOUNTER — Ambulatory Visit
Admission: RE | Admit: 2018-01-14 | Discharge: 2018-01-14 | Disposition: A | Discharge status: Home / Self Care | Payer: Medicare Other | Source: Ambulatory Visit | Attending: Radiation Oncology | Admitting: Radiation Oncology

## 2018-01-14 ENCOUNTER — Inpatient Hospital Stay (HOSPITAL_BASED_OUTPATIENT_CLINIC_OR_DEPARTMENT_OTHER): Payer: Medicare Other | Admitting: Oncology

## 2018-01-14 ENCOUNTER — Other Ambulatory Visit: Payer: Self-pay | Admitting: *Deleted

## 2018-01-14 VITALS — BP 146/75 | HR 101 | Temp 100.7°F | Resp 20 | Ht 70.0 in | Wt 221.8 lb

## 2018-01-14 VITALS — BP 132/81 | HR 67 | Temp 99.8°F | Resp 20

## 2018-01-14 DIAGNOSIS — C7951 Secondary malignant neoplasm of bone: Secondary | ICD-10-CM | POA: Diagnosis not present

## 2018-01-14 DIAGNOSIS — Z5112 Encounter for antineoplastic immunotherapy: Secondary | ICD-10-CM | POA: Diagnosis not present

## 2018-01-14 DIAGNOSIS — C787 Secondary malignant neoplasm of liver and intrahepatic bile duct: Secondary | ICD-10-CM | POA: Diagnosis not present

## 2018-01-14 DIAGNOSIS — C61 Malignant neoplasm of prostate: Secondary | ICD-10-CM

## 2018-01-14 DIAGNOSIS — Z923 Personal history of irradiation: Secondary | ICD-10-CM | POA: Insufficient documentation

## 2018-01-14 DIAGNOSIS — R509 Fever, unspecified: Secondary | ICD-10-CM

## 2018-01-14 DIAGNOSIS — Z85828 Personal history of other malignant neoplasm of skin: Secondary | ICD-10-CM | POA: Insufficient documentation

## 2018-01-14 DIAGNOSIS — R112 Nausea with vomiting, unspecified: Secondary | ICD-10-CM

## 2018-01-14 DIAGNOSIS — C778 Secondary and unspecified malignant neoplasm of lymph nodes of multiple regions: Secondary | ICD-10-CM | POA: Insufficient documentation

## 2018-01-14 DIAGNOSIS — I482 Chronic atrial fibrillation, unspecified: Secondary | ICD-10-CM

## 2018-01-14 DIAGNOSIS — C4492 Squamous cell carcinoma of skin, unspecified: Secondary | ICD-10-CM

## 2018-01-14 DIAGNOSIS — D649 Anemia, unspecified: Secondary | ICD-10-CM

## 2018-01-14 LAB — COMPREHENSIVE METABOLIC PANEL
ALBUMIN: 2.6 g/dL — AB (ref 3.5–5.0)
ALT: 60 U/L — AB (ref 0–44)
AST: 97 U/L — AB (ref 15–41)
Alkaline Phosphatase: 338 U/L — ABNORMAL HIGH (ref 38–126)
Anion gap: 9 (ref 5–15)
BUN: 18 mg/dL (ref 8–23)
CHLORIDE: 106 mmol/L (ref 98–111)
CO2: 22 mmol/L (ref 22–32)
CREATININE: 0.89 mg/dL (ref 0.61–1.24)
Calcium: 8.2 mg/dL — ABNORMAL LOW (ref 8.9–10.3)
GFR calc non Af Amer: >60 mL/min (ref >60)
GLUCOSE: 138 mg/dL — AB (ref 70–99)
Potassium: 4.1 mmol/L (ref 3.5–5.1)
SODIUM: 137 mmol/L (ref 135–145)
Total Bilirubin: 1 mg/dL (ref 0.3–1.2)
Total Protein: 6.9 g/dL (ref 6.5–8.1)

## 2018-01-14 LAB — URINALYSIS, COMPLETE (UACMP) WITH MICROSCOPIC
BACTERIA UA: NONE SEEN
BILIRUBIN URINE: NEGATIVE
Glucose, UA: NEGATIVE mg/dL
Hgb urine dipstick: NEGATIVE
KETONES UR: NEGATIVE mg/dL
LEUKOCYTES UA: NEGATIVE
NITRITE: NEGATIVE
Protein, ur: 100 mg/dL — AB
Specific Gravity, Urine: 1.019 (ref 1.005–1.030)
Squamous Epithelial / LPF: NONE SEEN (ref 0–5)
pH: 7 (ref 5.0–8.0)

## 2018-01-14 LAB — CBC WITH DIFFERENTIAL/PLATELET
Basophils Absolute: 0.1 10*3/uL (ref 0–0.1)
Basophils Relative: 1 %
Eosinophils Absolute: 0.1 10*3/uL (ref 0–0.7)
Eosinophils Relative: 1 %
HEMATOCRIT: 23.6 % — AB (ref 40.0–52.0)
HEMOGLOBIN: 8 g/dL — AB (ref 13.0–18.0)
LYMPHS ABS: 0.4 10*3/uL — AB (ref 1.0–3.6)
Lymphocytes Relative: 6 %
MCH: 32 pg (ref 26.0–34.0)
MCHC: 33.8 g/dL (ref 32.0–36.0)
MCV: 94.8 fL (ref 80.0–100.0)
MONO ABS: 0.7 10*3/uL (ref 0.2–1.0)
MONOS PCT: 11 %
NEUTROS PCT: 81 %
Neutro Abs: 5.3 10*3/uL (ref 1.4–6.5)
Platelets: 76 10*3/uL — ABNORMAL LOW (ref 150–440)
RBC: 2.49 MIL/uL — ABNORMAL LOW (ref 4.40–5.90)
RDW: 17.7 % — ABNORMAL HIGH (ref 11.5–14.5)
WBC: 6.6 10*3/uL (ref 3.8–10.6)

## 2018-01-14 LAB — SAMPLE TO BLOOD BANK

## 2018-01-14 LAB — MAGNESIUM: Magnesium: 1.8 mg/dL (ref 1.7–2.4)

## 2018-01-14 LAB — TSH: TSH: 1.754 u[IU]/mL (ref 0.350–4.500)

## 2018-01-14 NOTE — Progress Notes (Signed)
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 [name] primary medical oncologist on 01/11/2018 for leg swelling and feeling fatigued.  Reported pain in his left shoulder.  Dr. [name] noted a irregular heart rhythm and rate but subsequently had a EKG performed revealing A. fib with VR of 145.  Consulted Dr. Nehemiah Massed who recommended 3 times daily dosing of metoprolol.  Discussed tentatively beginning [place] the following week.  Leg swelling thought to be likely from hypoalbuminemia.   Oncology History   . patient is 72 year old male with a past medical history significant for hypertension diabetes and COPD among other medical problems. He was admitted to the hospital in October 2017 with some symptoms of dizziness and abdominal pain. CT angiogram abdomen incidentally showed sclerotic metastatic disease throughout the visualized spine.   2. This was followed by an MRI of the lumbar spine which showed multifocal T1 and T2 hypointense lesions with areas of increased signal on STIR sequence are seen in multiple vertebral bodies. Largest lesions are in T11 T12 L1 and in the imaged sacrum. This is consistent with metastatic disease. Primary lesion is not identified. MRI brain showed no acute intracranial abnormality.   3. Patient was noted to have an elevated PSA and was referred to urology as an outpatient. He was recently seen by Dr. Tresa Moore on  05/29/2016. PSA was repeated at that time which came back elevated at 10.3. 3 months over the value was 10.81.  4. Bone scan on 07/07/16 showed : Widespread radiotracer uptake over the axial and appendicular skeleton as described compatible with metastatic disease and correlating with sclerotic lesions on Ct. CT chest on 07/07/16 showed: Scattered sclerotic osseous metastatic lesions throughout spine, pelvis, proximal femora, ribs, and manubrium. Prostatic enlargement with thickened bladder wall question related to chronic bladder outlet obstruction.  Spiculated 12 x 11 x 7 mm LEFT upper lobe nodule question primary pulmonary neoplasm. Stable nonspecific small LEFT adrenal nodule. Single minimally enlarged AP window lymph node. BILATERAL inguinal hernias containing fat. 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 Recommend annual imaging followup by CTA or MRA.   5. Anemia work up from 06/20/16 was as follows: CBC showed white count of 5.2, H&H of 8.1/25 and a platelet count of 164. CMP showed elevated alkaline phosphatase of 391. Multiple myeloma panel showed normal quantitative immunoglobulins and no monoclonal M protein.Vitamin B12 level was low normal at 225. LDH was mildly elevated at 234. Ferritin was low normal at 27. Reticulocyte count was 2.4% inappropriately low for the degree of anemia. Haptoglobin was elevated at 273.  6. Patients case was discussed at tumor board and plan was to watch the lung nodule with scans as it was in a difficult area to biopsy. CT guided bone biopsy was obtained which showed metastatic prostate cancer for which he is on lupron  7. Given that he does not have visceral or lymph node metastasis, docetaxel/zytigawas not started. Also patient lives alone  and does not have a good social support or means of communication. Those options to be consideredat  progression.   8. Bone scan on 12/22/2016 showed significantly worsening osseous metastatic disease. Left upper lobe nodule was slightly smaller in size. Zytiga was added in setting of worsening bone disease along with prednisonein aug 2018. Lupron administered 02/13/2017.  9. Scans after 3 months of zytiga showed progression of bone mets. PSA remains under control. Case discussed at tumor board and plan was to hold zytiga and proceed with [facility] for 6 months followed by repeat scans. xofigo started in jan 2019. Patientreceived 6 doses of xofigo. Last dose on 11/21/17  10. He was then found to have 2 new skin lesions- one showed SCC. 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 PET/CT scan on 12/11/17 showed:1. Examination is positive for extensive FDG avid, hypermetabolic liver metastases. 2. Widespread hypermetabolic sclerotic bone metastases. 3. Increased uptake within soft tissue in the prostate bed compatible with residual/recurrent hypermetabolic local tumor. 4. Hypermetabolic left-sided mediastinal and right hilar lymph nodes. Suspicious for nodal metastasis. 5. Aortic atherosclerosis and coronary artery atherosclerotic calcifications. Aortic Atherosclerosis (ICD10-I70.0).  12. Prelim path shows high grade neuroendocrine carcinoma positive for PSA suggestive of prostate primary        Prostate cancer metastatic to bone ([place])   07/28/2016 Initial Diagnosis    Prostate cancer metastatic to bone ([place])      01/11/2018 -  Chemotherapy    The patient had nivolumab (OPDIVO) 240 mg in sodium chloride 0.9 % 100 mL chemo infusion, 240 mg, Intravenous, Once, 0 of 6 cycles  for chemotherapy treatment.         Interval history-  Patient presents today from radiation oncology for low-grade temperature. In clinic temperature 99.6.  Patient asymptomatic. Associated symptoms include weakness and fatigue, shortness of breath,  cough and sputum production which all appear to bechronic.  New onset bilateral lower extremity swelling. Seen on Friday by primary medical oncologist Dr. [name] and thought to be due to hypoalbuminemia. Biggest complaint today is swelling in bilateral lower extremities because he is unable to wear his shoes comfortably.   ECOG FS:1 - Symptomatic but completely ambulatory  Review of systems- Review of Systems  Constitutional: Positive for malaise/fatigue. Negative for chills, fever and weight loss.       Temp 99.6  HENT: Negative for congestion and ear pain.   Eyes: Negative.  Negative for blurred vision and double vision.  Respiratory: Positive for cough, sputum production, shortness of breath and wheezing.   Cardiovascular: Negative.  Negative for chest pain, palpitations and leg swelling.  Gastrointestinal: Positive for nausea (One episode this morning). Negative for abdominal pain, constipation, diarrhea and vomiting.  Genitourinary: Negative for dysuria, frequency and urgency.  Musculoskeletal: Negative for back pain and falls.  Skin: Negative.  Negative for rash.  Neurological: Positive for weakness. Negative for headaches.  Endo/Heme/Allergies: Negative.  Does not bruise/bleed easily.  Psychiatric/Behavioral: Negative.  Negative for depression. The patient is not nervous/anxious and does not have insomnia.      Current treatment- Starting opdivo tomorrow  No Known Allergies   Past Medical History:  Diagnosis Date  . Anemia   . Arthritis   . Cancer [facility])    prostate  . COPD (chronic obstructive pulmonary disease) ([place])   . Coronary artery disease   . Cough    chronic  . Diabetes mellitus without complication ([place])   . Edema   . Elevated  PSA   . Headache   . Hypertension   . Orthopnea   . Oxygen deficit    o2 prn  . Prostate cancer ([place])   . Shortness of breath dyspnea   . Sleep apnea    cpap     Past Surgical History:  Procedure Laterality Date  . CARDIAC  CATHETERIZATION  2014  . COLONOSCOPY    . CYSTOSCOPY W/ RETROGRADES Bilateral 10/18/2016   Procedure: CYSTOSCOPY WITH RETROGRADE PYELOGRAM;  Surgeon: Hollice Espy, MD;  Location: [facility] ORS;  Service: Urology;  Laterality: Bilateral;  . EYE SURGERY    . KNEE ARTHROSCOPY    . TRANSURETHRAL RESECTION OF BLADDER TUMOR N/A 10/18/2016   Procedure: TRANSURETHRAL RESECTION OF BLADDER TUMOR (TURBT);  Surgeon: Hollice Espy, MD;  Location: [facility] ORS;  Service: Urology;  Laterality: N/A;  . TRANSURETHRAL RESECTION OF PROSTATE N/A 10/18/2016   Procedure: TRANSURETHRAL RESECTION OF THE PROSTATE (TURP) CHANNEL TURP;  Surgeon: Hollice Espy, MD;  Location: [facility] ORS;  Service: Urology;  Laterality: N/A;    Social History   Socioeconomic History  . Marital status: Single    Spouse name: Not on file  . Number of children: Not on file  . Years of education: Not on file  . Highest education level: Not on file  Occupational History  . Not on file  Social Needs  . Financial resource strain: Not on file  . Food insecurity:    Worry: Not on file    Inability: Not on file  . Transportation needs:    Medical: Not on file    Non-medical: Not on file  Tobacco Use  . Smoking status: Current Every Day Smoker    Packs/day: 0.50    Years: 55.00    Pack years: 27.50    Types: Cigarettes  . Smokeless tobacco: Never Used  Substance and Sexual Activity  . Alcohol use: No  . Drug use: No  . Sexual activity: Not Currently  Lifestyle  . Physical activity:    Days per week: Not on file    Minutes per session: Not on file  . Stress: Not on file  Relationships  . Social connections:    Talks on phone: Not on file    Gets together: Not on file    Attends religious service: Not on file    Active member of club or organization: Not on file    Attends meetings of clubs or organizations: Not on file    Relationship status: Not on file  . Intimate partner violence:    Fear of current or ex partner: Not on file     Emotionally abused: Not on file    Physically abused: Not on file    Forced sexual activity: Not on file  Other Topics Concern  . Not on file  Social History Narrative  . Not on file    Family History  Problem Relation Age of Onset  . CVA Mother   . CAD Father      Current Outpatient Medications:  .  amiodarone (PACERONE) 200 MG tablet, Take 1 tablet (200 mg total) by mouth 2 (two) times daily., Disp: 60 tablet, Rfl: 0 .  atorvastatin (LIPITOR) 40 MG tablet, Take 1 tablet (40 mg total) by mouth daily., Disp: 30 tablet, Rfl: 0 .  DULoxetine (CYMBALTA) 30 MG capsule, , Disp: , Rfl:  .  ferrous sulfate 325 (65 FE) MG EC tablet, Take 1 tablet (325 mg total) by mouth 2 (two) times daily with a  meal., Disp: 60 tablet, Rfl: 3 .  finasteride (PROSCAR) 5 MG tablet, Take 5 mg by mouth daily., Disp: , Rfl:  .  Fluticasone-Salmeterol (ADVAIR) 250-50 MCG/DOSE AEPB, Inhale 1 puff into the lungs 2 (two) times daily., Disp: , Rfl:  .  ketoconazole (NIZORAL) 2 % cream, Apply 1 application topically daily as needed. , Disp: , Rfl:  .  metoprolol tartrate (LOPRESSOR) 100 MG tablet, Take 1 tablet (100 mg total) by mouth 2 (two) times daily., Disp: 60 tablet, Rfl: 0 .  OLANZapine (ZYPREXA) 10 MG tablet, Take 1 tablet (10 mg total) by mouth at bedtime., Disp: 30 tablet, Rfl: 0 .  ondansetron (ZOFRAN) 4 MG tablet, Take 1 tablet (4 mg total) by mouth every 8 (eight) hours as needed for nausea or vomiting., Disp: 20 tablet, Rfl: 0 .  pantoprazole (PROTONIX) 40 MG tablet, Take 1 tablet (40 mg total) by mouth daily., Disp: 30 tablet, Rfl: 0 .  tamsulosin (FLOMAX) 0.4 MG CAPS capsule, Take 1 capsule (0.4 mg total) by mouth daily., Disp: 90 capsule, Rfl: 3 .  vitamin B-12 (CYANOCOBALAMIN) 1000 MCG tablet, Take 1 tablet (1,000 mcg total) by mouth daily., Disp: 30 tablet, Rfl: 3 .  lidocaine-prilocaine (EMLA) cream, Apply to affected area once (Patient not taking: Reported on 01/14/2018), Disp: 30 g, Rfl: 3 .   nicotine (NICODERM CQ - DOSED IN MG/24 HOURS) 14 mg/24hr patch, Place 1 patch (14 mg total) onto the skin daily as needed (nicotine craving). Okay to substitute generic (Patient not taking: Reported on 01/14/2018), Disp: 28 patch, Rfl: 0  Physical exam:  Vitals:   01/14/18 1418  BP: 132/81  Pulse: 67  Resp: 20  Temp: 99.8 F (37.7 C)  TempSrc: Tympanic   Physical Exam  Constitutional: He is oriented to person, place, and time. Vital signs are normal. He appears well-developed and well-nourished.  HENT:  Head: Normocephalic and atraumatic.  Eyes: Pupils are equal, round, and reactive to light.  Neck: Normal range of motion.  Cardiovascular: Normal rate, regular rhythm and normal heart sounds.  No murmur heard. Pulmonary/Chest: Effort normal. He has decreased breath sounds. He has wheezes in the right upper field, the right lower field, the left upper field and the left lower field.  Abdominal: Soft. Normal appearance and bowel sounds are normal. He exhibits no distension. There is no tenderness.  Musculoskeletal: Normal range of motion. He exhibits edema.  Neurological: He is alert and oriented to person, place, and time.  Skin: Skin is warm and dry. No rash noted. There is pallor.  Psychiatric: Judgment normal.     CMP Latest Ref Rng & Units 01/14/2018  Glucose 70 - 99 mg/dL 138(H)  BUN 8 - 23 mg/dL 18  Creatinine 0.61 - 1.24 mg/dL 0.89  Sodium 135 - 145 mmol/L 137  Potassium 3.5 - 5.1 mmol/L 4.1  Chloride 98 - 111 mmol/L 106  CO2 22 - 32 mmol/L 22  Calcium 8.9 - 10.3 mg/dL 8.2(L)  Total Protein 6.5 - 8.1 g/dL 6.9  Total Bilirubin 0.3 - 1.2 mg/dL 1.0  Alkaline Phos 38 - 126 U/L 338(H)  AST 15 - 41 U/L 97(H)  ALT 0 - 44 U/L 60(H)   CBC Latest Ref Rng & Units 01/14/2018  WBC 3.8 - 10.6 K/uL 6.6  Hemoglobin 13.0 - 18.0 g/dL 8.0(L)  Hematocrit 40.0 - 52.0 % 23.6(L)  Platelets 150 - 440 K/uL 76(L)    No images are attached to the encounter.  US Renal  Result Date:  12/29/2017 CLINICAL DATA:  Acute renal insufficiency. History of prostate carcinoma EXAM: RENAL / URINARY TRACT ULTRASOUND COMPLETE COMPARISON:  PET-CT December 11, 2017 FINDINGS: Right Kidney: Length: 9.4 cm. Echogenicity and renal cortical thickness are within normal limits. No mass, perinephric fluid, or hydronephrosis visualized. There is mild scarring in the upper pole right kidney. No sonographically demonstrable calculus or ureterectasis. Left Kidney: Length: 10.1 cm. Echogenicity and renal cortical thickness are within normal limits. No perinephric fluid or hydronephrosis visualized. There is a cyst in the lower pole left kidney measuring 0.9 x 1.3 x 1.1 cm. No sonographically demonstrable calculus or ureterectasis. Bladder: Appears normal for degree of bladder distention. IMPRESSION: Slight scarring upper pole right kidney. Small cyst lower pole left kidney. Study otherwise unremarkable. In particular, no obstructing focus in either kidney. Renal cortical thickness and echogenicity are within normal limits bilaterally. Electronically Signed   By: Lowella Grip III M.D.   On: 12/29/2017 17:54   [place] Core Biopsy (liver)  Result Date: 01/04/2018 INDICATION: 72 year old male with metastatic prostate cancer. Patient has numerous liver lesions and request for liver lesion biopsy. EXAM: ULTRASOUND-GUIDED LIVER LESION BIOPSY MEDICATIONS: None. ANESTHESIA/SEDATION: Moderate (conscious) sedation was employed during this procedure. A total of Versed 2 mg and Fentanyl 50 mcg was administered intravenously. Moderate Sedation Time: 10 minutes. The patient's level of consciousness and vital signs were monitored continuously by radiology nursing throughout the procedure under my direct supervision. FLUOROSCOPY TIME:  None COMPLICATIONS: None immediate. PROCEDURE: Informed written consent was obtained from the patient after a thorough discussion of the procedural risks, benefits and alternatives. All questions were  addressed. A timeout was performed prior to the initiation of the procedure. Liver was evaluated with ultrasound. Lesion along the inferior right hepatic lobe was targeted for biopsy. The right side of the abdomen was prepped with chlorhexidine and a sterile field was created. Skin and soft tissues anesthetized with 1% lidocaine. 17 gauge coaxial needle directed into the liver and lesion with ultrasound guidance. Three core biopsies were obtained with an 18 gauge core device. Specimens placed in formalin. Gel-Foam slurry was injected through the 17 gauge needle as it was removed. Bandage placed over the puncture site. FINDINGS: Numerous hypoechoic and hyperechoic lesions throughout the liver. Hypoechoic lesion in the inferior right hepatic lobe was targeted. Biopsy needle was confirmed within the lesion. No significant bleeding or hematoma formation following the core biopsies. IMPRESSION: Ultrasound-guided core biopsies of a right hepatic lesion. Electronically Signed   By: Markus Daft M.D.   On: 01/04/2018 11:55   US Abdomen Limited Ruq  Result Date: 01/05/2018 CLINICAL DATA:  Bleeding post liver biopsy. EXAM: ULTRASOUND ABDOMEN LIMITED RIGHT UPPER QUADRANT COMPARISON:  PET-CT 12/11/2017 FINDINGS: Gallbladder: The gallbladder is decompressed. There is apparent gallbladder wall thickening of up to 4 mm. No cholelithiasis. The sonographic Murphy's sign was recorded is negative. Common bile duct: Diameter: 7.1 mm Liver: Heterogeneous echotexture consistent with presence of multiple hepatic metastatic lesions. Portal vein is patent on color Doppler imaging with normal direction of blood flow towards the liver. There is a small amount of perihepatic ascites. IMPRESSION: Small amount of perihepatic ascites. Multiple hepatic metastatic lesions. Decompressed gallbladder with apparent gallbladder wall thickening of up to 4 mm. This may be reactive, due to the decompressed state of the gallbladder or represent acalculus  cholecystitis. Electronically Signed   By: Fidela Salisbury M.D.   On: 01/05/2018 11:04     Assessment and plan- Patient is a 72 y.o. male who presents from  radiation oncology with low-grade temperature of 97.6.  1.  Neuroendocrine carcinoma: Recently completed 6 cycles of Xofigo. Last given 11/21/17 .  Repeat PET noted progression and high-grade neuroendocrine carcinoma. Scheduled to tentatively begin [place] tomorrow.   2. Anemia: Hemoglobin 8 today.  He is scheduled to have 2 units of packed red blood cells tomorrow.  3. Low-grade temperature: 99.6 in clinic today.  Has distant scattered wheezing bilateral upper and lower lobes. He continues to smoke.  Has chronic cough, sputum production and shortness of breath. Will check UA and urine culture today to rule out urinary tract infection.  Patient's labs look surprisingly good.  4.  Bilateral lower extremity swelling: +1 dependent non-pitting edema.  Thought to be from hypoalbuminemia.  Encouraged him to keep legs elevated and to purchase TED hose stockings.   5.  A. Fib: Heart rate under control today.  6.  Elevated AST/ALT: History of liver mets.  Denies abdominal pain.  No hepatomegaly.  We will speak with Dr. [name] to see further work-up is needed.    Visit Diagnosis 1. Fever, unspecified fever cause   2. Prostate cancer metastatic to bone [facility])     Patient expressed understanding and was in agreement with this plan. He also understands that He can call clinic at any time with any questions, concerns, or complaints.   Greater than 50% was spent in counseling and coordination of care with this patient including but not limited to discussion of the relevant topics above (See A&P) including, but not limited to diagnosis and management of acute and chronic medical conditions.    Faythe Casa, AGNP-C [facility] at [place]- [phone] Pager- [phone] 01/14/2018 3:30 PM

## 2018-01-14 NOTE — Progress Notes (Signed)
Radiation Oncology Follow up Note  Name: NICKLOS GAXIOLA   Date:   01/14/2018 MRN:  [id number] DOB: [birth year]    This 72 y.o. male presents to the clinic today for one-month follow-up status post Xofigoinfusion as well as electron beam therapy to metastatic lesion on his back.  REFERRING PROVIDER: [name], MD  HPI: patient is a 72 year old male now one month out having completed 6 infusions ofXofigo castrate resistant stage IV prostate cancer He also a lesion on his back which was a metastatic.neuroendocrine carcinoma primary tumor versus a metastatic lesion. He had a PET scan in July showing widespread liver metastasis liver metastasis and mediastinal and right hilar lymph nodes. Number a past showed high-grade neuroendocrine carcinoma positive for PSA suggestive of prostate primary. He is seen today he is doing only fair. He complains about lethargy feeling tired. His bone pain has improved. The lesion on his back is completely resolved. He is being considered for opdivo immunotherapy.  COMPLICATIONS OF TREATMENT: none  FOLLOW UP COMPLIANCE: keeps appointments   PHYSICAL EXAM:  BP (!) 146/75 (BP Location: Left Wrist, Patient Position: Sitting, Cuff Size: Normal)   Pulse (!) 101   Temp (!) 100.7 F (38.2 C) (Tympanic)   Resp 20   Ht 5' 10"  (1.778 m)   Wt 221 lb 12.5 oz (100.6 kg)   BMI 31.82 kg/m  Wheelchair-bound male no focal neurologic deficits are appreciated. Lesion on his back is completely resolved.Well-developed well-nourished patient in NAD. HEENT reveals PERLA, EOMI, discs not visualized.  Oral cavity is clear. No oral mucosal lesions are identified. Neck is clear without evidence of cervical or supraclavicular adenopathy. Lungs are clear to A&P. Cardiac examination is essentially unremarkable with regular rate and rhythm without murmur rub or thrill. Abdomen is benign with no organomegaly or masses noted. Motor sensory and DTR levels are equal and symmetric in the upper and  lower extremities. Cranial nerves II through XII are grossly intact. Proprioception is intact. No peripheral adenopathy or edema is identified. No motor or sensory levels are noted. Crude visual fields are within normal range.  RADIOLOGY RESULTS: PET CT scan is reviewed and compatible above-stated findings  PLAN: I'm referring the patient to the symptom clinic today. He is being seen by medical oncology in anticipation of immunotherapy. At this time I'm going to turn follow-up care over to medical oncology. Patient has been anemic but has stabilized may be result of the macro X versus bone marrow involvement whether to do bone marrow biopsy will be decided in the future. Patient knows to call with any concerns at any time.  I would like to take this opportunity to thank you for allowing me to participate in the care of your patient.Noreene Filbert, MD

## 2018-01-15 ENCOUNTER — Inpatient Hospital Stay (HOSPITAL_BASED_OUTPATIENT_CLINIC_OR_DEPARTMENT_OTHER): Payer: Medicare Other | Admitting: Oncology

## 2018-01-15 ENCOUNTER — Inpatient Hospital Stay: Payer: Medicare Other

## 2018-01-15 ENCOUNTER — Encounter: Payer: Self-pay | Admitting: Oncology

## 2018-01-15 VITALS — BP 142/84 | HR 92 | Temp 97.4°F | Resp 18 | Ht 70.0 in | Wt 222.1 lb

## 2018-01-15 DIAGNOSIS — C7951 Secondary malignant neoplasm of bone: Principal | ICD-10-CM

## 2018-01-15 DIAGNOSIS — R945 Abnormal results of liver function studies: Secondary | ICD-10-CM

## 2018-01-15 DIAGNOSIS — Z5112 Encounter for antineoplastic immunotherapy: Secondary | ICD-10-CM | POA: Diagnosis not present

## 2018-01-15 DIAGNOSIS — D649 Anemia, unspecified: Secondary | ICD-10-CM

## 2018-01-15 DIAGNOSIS — R740 Nonspecific elevation of levels of transaminase and lactic acid dehydrogenase [LDH]: Secondary | ICD-10-CM

## 2018-01-15 DIAGNOSIS — C787 Secondary malignant neoplasm of liver and intrahepatic bile duct: Secondary | ICD-10-CM | POA: Diagnosis not present

## 2018-01-15 DIAGNOSIS — C779 Secondary and unspecified malignant neoplasm of lymph node, unspecified: Secondary | ICD-10-CM | POA: Diagnosis not present

## 2018-01-15 DIAGNOSIS — C61 Malignant neoplasm of prostate: Secondary | ICD-10-CM

## 2018-01-15 DIAGNOSIS — R7989 Other specified abnormal findings of blood chemistry: Secondary | ICD-10-CM

## 2018-01-15 DIAGNOSIS — C792 Secondary malignant neoplasm of skin: Secondary | ICD-10-CM

## 2018-01-15 LAB — CBC
HCT: 23.6 % — ABNORMAL LOW (ref 40.0–52.0)
Hemoglobin: 8 g/dL — ABNORMAL LOW (ref 13.0–18.0)
MCH: 32.5 pg (ref 26.0–34.0)
MCHC: 34 g/dL (ref 32.0–36.0)
MCV: 95.5 fL (ref 80.0–100.0)
Platelets: 83 10*3/uL — ABNORMAL LOW (ref 150–440)
RBC: 2.47 MIL/uL — AB (ref 4.40–5.90)
RDW: 17.4 % — ABNORMAL HIGH (ref 11.5–14.5)
WBC: 5.8 10*3/uL (ref 3.8–10.6)

## 2018-01-15 LAB — SAMPLE TO BLOOD BANK

## 2018-01-15 LAB — URINE CULTURE: Culture: NO GROWTH

## 2018-01-15 NOTE — Progress Notes (Signed)
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 Mets to bone , liver, LN and skin  Chief complaint/ Reason for visit- on treatment assessment prior to cycle 1 of opdivo  Heme/Onc history: 1. patient is 72 year old male with a past medical history significant for hypertension diabetes and COPD among other medical problems. He was admitted to the hospital in October 2017 with some symptoms of dizziness and abdominal pain. CT angiogram abdomen incidentally showed sclerotic metastatic disease throughout the visualized spine.   2. This was followed by an MRI of the lumbar spine which showed multifocal T1 and T2 hypointense lesions with areas of increased signal on STIR sequence are seen in multiple vertebral bodies. Largest lesions are in T11 T12 L1 and in the imaged sacrum. This is consistent with metastatic disease. Primary lesion is not identified. MRI brain showed no acute intracranial abnormality.   3. Patient was noted to have an elevated PSA and was referred to urology as an outpatient. He was recently seen by Dr. Tresa Moore on 05/29/2016. PSA was repeated at that time which came back elevated at 10.3. 3 months over the value was 10.81.  4. Bone scan on 07/07/16 showed : Widespread radiotracer uptake over the axial and appendicular skeleton as described compatible with metastatic disease and correlating with sclerotic lesions on Ct. CT chest on 07/07/16 showed: Scattered sclerotic osseous metastatic lesions throughout spine, pelvis, proximal femora, ribs, and manubrium. Prostatic  enlargement with thickened bladder wall question related to chronic bladder outlet obstruction.  Spiculated 12 x 11 x 7 mm LEFT upper lobe nodule question primary pulmonary neoplasm. Stable nonspecific small LEFT adrenal nodule. Single minimally enlarged AP window lymph node. BILATERAL inguinal hernias containing fat. 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 Recommend annual imaging followup by CTA or MRA.   5. Anemia work up from 06/20/16 was as follows: CBC showed white count of 5.2, H&H of 8.1/25 and a platelet count of 164. CMP showed elevated alkaline phosphatase of 391. Multiple myeloma panel showed normal quantitative immunoglobulins and no monoclonal M protein.Vitamin B12 level was low normal at 225. LDH was mildly elevated at 234. Ferritin was low normal at 27. Reticulocyte count was 2.4% inappropriately low for the degree of anemia. Haptoglobin was elevated at 273.  6. Patients case was discussed at tumor board and plan was to watch the lung nodule with scans as it was in a difficult area to biopsy. CT guided bone biopsy was obtained which showed metastatic prostate cancer for which he is on lupron  7. Given that he does not have visceral or lymph node metastasis, docetaxel/zytigawas not started. Also patient lives alone and does not have a good social support or means of communication. Those options to be consideredat progression.   8. Bone scan on 12/22/2016 showed significantly worsening osseous metastatic disease. Left upper lobe nodule was slightly smaller in size. Zytiga was added in setting of worsening bone disease along with prednisonein aug 2018. Lupron administered 02/13/2017.  9. Scans after 3 months of zytiga showed progression of bone mets. PSA remains under control. Case discussed at tumor  board and plan was to hold zytiga and proceed with [facility] for 6 months  followed by repeat scans. xofigo started in jan 2019. Patientreceived 6 doses of xofigo. Last dose on 11/21/17  10. He was then found to have 2 new skin lesions- one showed SCC. 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 PET/CT scan on 12/11/17 showed:1. Examination is positive for extensive FDG avid, hypermetabolic liver metastases. 2. Widespread hypermetabolic sclerotic bone metastases. 3. Increased uptake within soft tissue in the prostate bed compatible with residual/recurrent hypermetabolic local tumor. 4. Hypermetabolic left-sided mediastinal and right hilar lymph nodes. Suspicious for nodal metastasis. 5. Aortic atherosclerosis and coronary artery atherosclerotic calcifications. Aortic Atherosclerosis (ICD10-I70.0).  12. Path shows high grade neuroendocrine carcinoma positive for PSA suggestive of prostate primary    Interval history- feels fatigued. Appetite is fair. Leg swelling persists. Denies any fever  ECOG PS- 2 Pain scale- 0 Opioid associated constipation- no  Review of systems- Review of Systems  Constitutional: Positive for malaise/fatigue. Negative for chills, fever and weight loss.  HENT: Negative for congestion, ear discharge and nosebleeds.   Eyes: Negative for blurred vision.  Respiratory: Negative for cough, hemoptysis, sputum production, shortness of breath and wheezing.   Cardiovascular: Positive for leg swelling. Negative for chest pain, palpitations, orthopnea and claudication.  Gastrointestinal: Negative for abdominal pain, blood in stool, constipation, diarrhea, heartburn, melena, nausea and vomiting.  Genitourinary: Negative for dysuria, flank pain, frequency, hematuria and urgency.  Musculoskeletal: Negative for back pain, joint pain and myalgias.  Skin: Negative for rash.  Neurological: Negative for dizziness, tingling, focal weakness, seizures, weakness and headaches.    Endo/Heme/Allergies: Does not bruise/bleed easily.  Psychiatric/Behavioral: Negative for depression and suicidal ideas. The patient does not have insomnia.       No Known Allergies   Past Medical History:  Diagnosis Date  . Anemia   . Arthritis   . Cancer [facility])    prostate  . COPD (chronic obstructive pulmonary disease) ([place])   . Coronary artery disease   . Cough    chronic  . Diabetes mellitus without complication ([place])   . Edema   . Elevated PSA   . Headache   . Hypertension   . Orthopnea   . Oxygen deficit    o2 prn  . Prostate cancer ([place])   . Shortness of breath dyspnea   . Sleep apnea    cpap     Past Surgical History:  Procedure Laterality Date  . CARDIAC CATHETERIZATION  2014  . COLONOSCOPY    . CYSTOSCOPY W/ RETROGRADES Bilateral 10/18/2016   Procedure: CYSTOSCOPY WITH RETROGRADE PYELOGRAM;  Surgeon: Hollice Espy, MD;  Location: [facility] ORS;  Service: Urology;  Laterality: Bilateral;  . EYE SURGERY    . KNEE ARTHROSCOPY    . TRANSURETHRAL RESECTION OF BLADDER TUMOR N/A 10/18/2016   Procedure: TRANSURETHRAL RESECTION OF BLADDER TUMOR (TURBT);  Surgeon: Hollice Espy, MD;  Location: [facility] ORS;  Service: Urology;  Laterality: N/A;  . TRANSURETHRAL RESECTION OF PROSTATE N/A 10/18/2016   Procedure: TRANSURETHRAL RESECTION OF THE PROSTATE (TURP) CHANNEL TURP;  Surgeon: Hollice Espy, MD;  Location: [facility] ORS;  Service: Urology;  Laterality: N/A;    Social History   Socioeconomic History  . Marital status: Single    Spouse name: Not on file  . Number of children: Not on file  . Years of education: Not on file  . Highest education level: Not on file  Occupational History  . Not  on file  Social Needs  . Financial resource strain: Not on file  . Food insecurity:    Worry: Not on file    Inability: Not on file  . Transportation needs:    Medical: Not on file    Non-medical: Not on file  Tobacco Use  . Smoking status: Current Every Day Smoker     Packs/day: 0.50    Years: 55.00    Pack years: 27.50    Types: Cigarettes  . Smokeless tobacco: Never Used  Substance and Sexual Activity  . Alcohol use: No  . Drug use: No  . Sexual activity: Not Currently  Lifestyle  . Physical activity:    Days per week: Not on file    Minutes per session: Not on file  . Stress: Not on file  Relationships  . Social connections:    Talks on phone: Not on file    Gets together: Not on file    Attends religious service: Not on file    Active member of club or organization: Not on file    Attends meetings of clubs or organizations: Not on file    Relationship status: Not on file  . Intimate partner violence:    Fear of current or ex partner: Not on file    Emotionally abused: Not on file    Physically abused: Not on file    Forced sexual activity: Not on file  Other Topics Concern  . Not on file  Social History Narrative  . Not on file    Family History  Problem Relation Age of Onset  . CVA Mother   . CAD Father      Current Outpatient Medications:  .  amiodarone (PACERONE) 200 MG tablet, Take 1 tablet (200 mg total) by mouth 2 (two) times daily., Disp: 60 tablet, Rfl: 0 .  atorvastatin (LIPITOR) 40 MG tablet, Take 1 tablet (40 mg total) by mouth daily., Disp: 30 tablet, Rfl: 0 .  DULoxetine (CYMBALTA) 30 MG capsule, , Disp: , Rfl:  .  ferrous sulfate 325 (65 FE) MG EC tablet, Take 1 tablet (325 mg total) by mouth 2 (two) times daily with a meal., Disp: 60 tablet, Rfl: 3 .  finasteride (PROSCAR) 5 MG tablet, Take 5 mg by mouth daily., Disp: , Rfl:  .  Fluticasone-Salmeterol (ADVAIR) 250-50 MCG/DOSE AEPB, Inhale 1 puff into the lungs 2 (two) times daily., Disp: , Rfl:  .  metoprolol tartrate (LOPRESSOR) 100 MG tablet, Take 1 tablet (100 mg total) by mouth 2 (two) times daily., Disp: 60 tablet, Rfl: 0 .  OLANZapine (ZYPREXA) 10 MG tablet, Take 1 tablet (10 mg total) by mouth at bedtime., Disp: 30 tablet, Rfl: 0 .  ondansetron (ZOFRAN) 4  MG tablet, Take 1 tablet (4 mg total) by mouth every 8 (eight) hours as needed for nausea or vomiting., Disp: 20 tablet, Rfl: 0 .  pantoprazole (PROTONIX) 40 MG tablet, Take 1 tablet (40 mg total) by mouth daily., Disp: 30 tablet, Rfl: 0 .  tamsulosin (FLOMAX) 0.4 MG CAPS capsule, Take 1 capsule (0.4 mg total) by mouth daily., Disp: 90 capsule, Rfl: 3 .  vitamin B-12 (CYANOCOBALAMIN) 1000 MCG tablet, Take 1 tablet (1,000 mcg total) by mouth daily., Disp: 30 tablet, Rfl: 3 .  ketoconazole (NIZORAL) 2 % cream, Apply 1 application topically daily as needed. , Disp: , Rfl:  .  lidocaine-prilocaine (EMLA) cream, Apply to affected area once (Patient not taking: Reported on 01/14/2018), Disp: 30 g, Rfl: 3 .  nicotine (NICODERM CQ - DOSED IN MG/24 HOURS) 14 mg/24hr patch, Place 1 patch (14 mg total) onto the skin daily as needed (nicotine craving). Okay to substitute generic (Patient not taking: Reported on 01/14/2018), Disp: 28 patch, Rfl: 0  Physical exam:  Vitals:   01/15/18 0909  BP: (!) 142/84  Pulse: 92  Resp: 18  Temp: (!) 97.4 F (36.3 C)  TempSrc: Tympanic  SpO2: 97%  Weight: 222 lb 1.6 oz (100.7 kg)  Height: 5' 10"  (1.778 m)   Physical Exam  Constitutional: He is oriented to person, place, and time. He appears well-developed and well-nourished.  Appears fatigued and frail  HENT:  Head: Normocephalic and atraumatic.  Eyes: Pupils are equal, round, and reactive to light. EOM are normal.  Neck: Normal range of motion.  Cardiovascular: Normal rate, regular rhythm and normal heart sounds.  Pulmonary/Chest: Effort normal and breath sounds normal.  Abdominal: Soft. Bowel sounds are normal.  Musculoskeletal: He exhibits edema (b/l +2).  Neurological: He is alert and oriented to person, place, and time.  Skin: Skin is warm and dry.     CMP Latest Ref Rng & Units 01/14/2018  Glucose 70 - 99 mg/dL 138(H)  BUN 8 - 23 mg/dL 18  Creatinine 0.61 - 1.24 mg/dL 0.89  Sodium 135 - 145 mmol/L 137   Potassium 3.5 - 5.1 mmol/L 4.1  Chloride 98 - 111 mmol/L 106  CO2 22 - 32 mmol/L 22  Calcium 8.9 - 10.3 mg/dL 8.2(L)  Total Protein 6.5 - 8.1 g/dL 6.9  Total Bilirubin 0.3 - 1.2 mg/dL 1.0  Alkaline Phos 38 - 126 U/L 338(H)  AST 15 - 41 U/L 97(H)  ALT 0 - 44 U/L 60(H)   CBC Latest Ref Rng & Units 01/15/2018  WBC 3.8 - 10.6 K/uL 5.8  Hemoglobin 13.0 - 18.0 g/dL 8.0(L)  Hematocrit 40.0 - 52.0 % 23.6(L)  Platelets 150 - 440 K/uL 83(L)    No images are attached to the encounter.  US Renal  Result Date: 12/29/2017 CLINICAL DATA:  Acute renal insufficiency. History of prostate carcinoma EXAM: RENAL / URINARY TRACT ULTRASOUND COMPLETE COMPARISON:  PET-CT December 11, 2017 FINDINGS: Right Kidney: Length: 9.4 cm. Echogenicity and renal cortical thickness are within normal limits. No mass, perinephric fluid, or hydronephrosis visualized. There is mild scarring in the upper pole right kidney. No sonographically demonstrable calculus or ureterectasis. Left Kidney: Length: 10.1 cm. Echogenicity and renal cortical thickness are within normal limits. No perinephric fluid or hydronephrosis visualized. There is a cyst in the lower pole left kidney measuring 0.9 x 1.3 x 1.1 cm. No sonographically demonstrable calculus or ureterectasis. Bladder: Appears normal for degree of bladder distention. IMPRESSION: Slight scarring upper pole right kidney. Small cyst lower pole left kidney. Study otherwise unremarkable. In particular, no obstructing focus in either kidney. Renal cortical thickness and echogenicity are within normal limits bilaterally. Electronically Signed   By: Lowella Grip III M.D.   On: 12/29/2017 17:54   [place] Core Biopsy (liver)  Result Date: 01/04/2018 INDICATION: 72 year old male with metastatic prostate cancer. Patient has numerous liver lesions and request for liver lesion biopsy. EXAM: ULTRASOUND-GUIDED LIVER LESION BIOPSY MEDICATIONS: None. ANESTHESIA/SEDATION: Moderate (conscious) sedation was  employed during this procedure. A total of Versed 2 mg and Fentanyl 50 mcg was administered intravenously. Moderate Sedation Time: 10 minutes. The patient's level of consciousness and vital signs were monitored continuously by radiology nursing throughout the procedure under my direct supervision. FLUOROSCOPY TIME:  None COMPLICATIONS: None immediate. PROCEDURE:  Informed written consent was obtained from the patient after a thorough discussion of the procedural risks, benefits and alternatives. All questions were addressed. A timeout was performed prior to the initiation of the procedure. Liver was evaluated with ultrasound. Lesion along the inferior right hepatic lobe was targeted for biopsy. The right side of the abdomen was prepped with chlorhexidine and a sterile field was created. Skin and soft tissues anesthetized with 1% lidocaine. 17 gauge coaxial needle directed into the liver and lesion with ultrasound guidance. Three core biopsies were obtained with an 18 gauge core device. Specimens placed in formalin. Gel-Foam slurry was injected through the 17 gauge needle as it was removed. Bandage placed over the puncture site. FINDINGS: Numerous hypoechoic and hyperechoic lesions throughout the liver. Hypoechoic lesion in the inferior right hepatic lobe was targeted. Biopsy needle was confirmed within the lesion. No significant bleeding or hematoma formation following the core biopsies. IMPRESSION: Ultrasound-guided core biopsies of a right hepatic lesion. Electronically Signed   By: Markus Daft M.D.   On: 01/04/2018 11:55   US Abdomen Limited Ruq  Result Date: 01/05/2018 CLINICAL DATA:  Bleeding post liver biopsy. EXAM: ULTRASOUND ABDOMEN LIMITED RIGHT UPPER QUADRANT COMPARISON:  PET-CT 12/11/2017 FINDINGS: Gallbladder: The gallbladder is decompressed. There is apparent gallbladder wall thickening of up to 4 mm. No cholelithiasis. The sonographic Murphy's sign was recorded is negative. Common bile duct:  Diameter: 7.1 mm Liver: Heterogeneous echotexture consistent with presence of multiple hepatic metastatic lesions. Portal vein is patent on color Doppler imaging with normal direction of blood flow towards the liver. There is a small amount of perihepatic ascites. IMPRESSION: Small amount of perihepatic ascites. Multiple hepatic metastatic lesions. Decompressed gallbladder with apparent gallbladder wall thickening of up to 4 mm. This may be reactive, due to the decompressed state of the gallbladder or represent acalculus cholecystitis. Electronically Signed   By: Fidela Salisbury M.D.   On: 01/05/2018 11:04     Assessment and plan- Patient is a 72 y.o. 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 Mets to bone , liver, LN and skin  We are still waiting for Opdivo to be obtained from the drug company on a compassionate basis as his insurance is not approved Opdivo.  We will tentatively plan to give him Opdivo in about 3 days time.  He does not need blood transfusion today.  Repeat CBC in 1 week's time for possible transfusion  Abnormal LFTs: Likely secondary to diffuse liver metastases.  Continue to monitor  I will see him back in 2 weeks time with CBC CMP for cycle #2 of Opdivo.  I again explained to him that overall his prognosis is poor and he is not a candidate for chemotherapy with his ongoing cytopenias.  It remains to be seen if Opdivo can have some effect on his ongoing malignancy which is widely metastatic  Leg swelling likely secondary to hypoalbuminemia and widespread malignancy.  Continue to monitor   Visit Diagnosis 1. Prostate cancer metastatic to bone ([place])   2. Liver metastases ([place])   3. Abnormal LFTs   4. Encounter for antineoplastic immunotherapy      Dr. Randa Evens, MD, MPH [facility] [phone] 01/15/2018 1:35 PM

## 2018-01-15 NOTE — Progress Notes (Signed)
No new changes noted today 

## 2018-01-16 ENCOUNTER — Encounter: Payer: Self-pay | Admitting: Pharmacy Technician

## 2018-01-16 NOTE — Progress Notes (Unsigned)
Patient has been approved for drug assistance by [employer] for Opdivo. The enrollment period is from 01/15/18-06/11/18 based on off label use. First DOS covered is 01/18/18.

## 2018-01-18 ENCOUNTER — Inpatient Hospital Stay: Payer: Medicare Other

## 2018-01-18 ENCOUNTER — Other Ambulatory Visit: Payer: Self-pay

## 2018-01-18 VITALS — BP 154/94 | HR 105 | Temp 95.0°F | Resp 18

## 2018-01-18 DIAGNOSIS — C61 Malignant neoplasm of prostate: Secondary | ICD-10-CM

## 2018-01-18 DIAGNOSIS — C7951 Secondary malignant neoplasm of bone: Principal | ICD-10-CM

## 2018-01-18 DIAGNOSIS — Z5112 Encounter for antineoplastic immunotherapy: Secondary | ICD-10-CM | POA: Diagnosis not present

## 2018-01-18 DIAGNOSIS — D649 Anemia, unspecified: Secondary | ICD-10-CM

## 2018-01-18 LAB — CBC
HEMATOCRIT: 24.6 % — AB (ref 40.0–52.0)
HEMOGLOBIN: 8.3 g/dL — AB (ref 13.0–18.0)
MCH: 32.3 pg (ref 26.0–34.0)
MCHC: 33.8 g/dL (ref 32.0–36.0)
MCV: 95.3 fL (ref 80.0–100.0)
Platelets: 105 10*3/uL — ABNORMAL LOW (ref 150–440)
RBC: 2.58 MIL/uL — ABNORMAL LOW (ref 4.40–5.90)
RDW: 17.4 % — ABNORMAL HIGH (ref 11.5–14.5)
WBC: 5.4 10*3/uL (ref 3.8–10.6)

## 2018-01-18 MED ORDER — SODIUM CHLORIDE 0.9 % IV SOLN
240.0000 mg | Freq: Once | INTRAVENOUS | Status: AC
  Administered 2018-01-18: 240 mg via INTRAVENOUS
  Filled 2018-01-18: qty 24

## 2018-01-18 MED ORDER — SODIUM CHLORIDE 0.9 % IV SOLN
Freq: Once | INTRAVENOUS | Status: AC
  Administered 2018-01-18: 10:00:00 via INTRAVENOUS
  Filled 2018-01-18: qty 1000

## 2018-01-18 NOTE — Progress Notes (Signed)
AST 97 on 01/14/18.  Ok to proceed with treatment per MD>

## 2018-01-18 NOTE — Progress Notes (Signed)
AST 97 on 01/14/18, B/P 154/94 HR 105, Per Judeen Hammans RN per Dr. [name] okay to proceed.   Pt tolerated infusion well.

## 2018-01-22 ENCOUNTER — Ambulatory Visit: Payer: Medicare Other | Admitting: Oncology

## 2018-01-22 ENCOUNTER — Ambulatory Visit: Payer: Medicare Other

## 2018-01-22 ENCOUNTER — Other Ambulatory Visit: Payer: Medicare Other

## 2018-01-25 ENCOUNTER — Other Ambulatory Visit: Payer: Self-pay | Admitting: Oncology

## 2018-01-25 ENCOUNTER — Other Ambulatory Visit: Payer: Self-pay

## 2018-01-25 ENCOUNTER — Inpatient Hospital Stay: Payer: Medicare Other

## 2018-01-25 ENCOUNTER — Other Ambulatory Visit: Payer: Self-pay | Admitting: *Deleted

## 2018-01-25 DIAGNOSIS — C61 Malignant neoplasm of prostate: Secondary | ICD-10-CM

## 2018-01-25 DIAGNOSIS — C7951 Secondary malignant neoplasm of bone: Secondary | ICD-10-CM

## 2018-01-25 DIAGNOSIS — Z5112 Encounter for antineoplastic immunotherapy: Secondary | ICD-10-CM | POA: Diagnosis not present

## 2018-01-25 DIAGNOSIS — D649 Anemia, unspecified: Secondary | ICD-10-CM

## 2018-01-25 LAB — CBC WITH DIFFERENTIAL/PLATELET
Basophils Absolute: 0.1 10*3/uL (ref 0–0.1)
Basophils Relative: 1 %
Eosinophils Absolute: 0.1 10*3/uL (ref 0–0.7)
Eosinophils Relative: 2 %
HEMATOCRIT: 20.7 % — AB (ref 40.0–52.0)
HEMOGLOBIN: 6.9 g/dL — AB (ref 13.0–18.0)
LYMPHS ABS: 0.4 10*3/uL — AB (ref 1.0–3.6)
Lymphocytes Relative: 10 %
MCH: 32.2 pg (ref 26.0–34.0)
MCHC: 33.3 g/dL (ref 32.0–36.0)
MCV: 96.6 fL (ref 80.0–100.0)
MONO ABS: 0.7 10*3/uL (ref 0.2–1.0)
MONOS PCT: 14 %
NEUTROS ABS: 3.4 10*3/uL (ref 1.4–6.5)
NEUTROS PCT: 73 %
Platelets: 70 10*3/uL — ABNORMAL LOW (ref 150–440)
RBC: 2.14 MIL/uL — ABNORMAL LOW (ref 4.40–5.90)
RDW: 17.8 % — AB (ref 11.5–14.5)
WBC: 4.7 10*3/uL (ref 3.8–10.6)

## 2018-01-25 LAB — COMPREHENSIVE METABOLIC PANEL
ALBUMIN: 2.7 g/dL — AB (ref 3.5–5.0)
ALK PHOS: 454 U/L — AB (ref 38–126)
ALT: 31 U/L (ref 0–44)
ANION GAP: 9 (ref 5–15)
AST: 53 U/L — ABNORMAL HIGH (ref 15–41)
BUN: 13 mg/dL (ref 8–23)
CHLORIDE: 104 mmol/L (ref 98–111)
CO2: 26 mmol/L (ref 22–32)
Calcium: 8.2 mg/dL — ABNORMAL LOW (ref 8.9–10.3)
Creatinine, Ser: 0.84 mg/dL (ref 0.61–1.24)
GFR calc non Af Amer: >60 mL/min (ref >60)
GLUCOSE: 103 mg/dL — AB (ref 70–99)
Potassium: 4.1 mmol/L (ref 3.5–5.1)
SODIUM: 139 mmol/L (ref 135–145)
Total Bilirubin: 0.7 mg/dL (ref 0.3–1.2)
Total Protein: 6.6 g/dL (ref 6.5–8.1)

## 2018-01-25 LAB — PREPARE RBC (CROSSMATCH)

## 2018-01-25 LAB — SAMPLE TO BLOOD BANK

## 2018-01-25 MED ORDER — SODIUM CHLORIDE 0.9% IV SOLUTION
250.0000 mL | Freq: Once | INTRAVENOUS | Status: AC
  Administered 2018-01-25: 250 mL via INTRAVENOUS
  Filled 2018-01-25: qty 250

## 2018-01-25 MED ORDER — FUROSEMIDE 10 MG/ML IJ SOLN
20.0000 mg | Freq: Once | INTRAMUSCULAR | Status: AC
  Administered 2018-01-25: 20 mg via INTRAVENOUS
  Filled 2018-01-25: qty 2

## 2018-01-25 MED ORDER — ACETAMINOPHEN 325 MG PO TABS
650.0000 mg | ORAL_TABLET | Freq: Once | ORAL | Status: AC
Start: 2018-01-25 — End: 2018-01-25
  Administered 2018-01-25: 650 mg via ORAL
  Filled 2018-01-25: qty 2

## 2018-01-25 MED ORDER — FUROSEMIDE 10 MG/ML IJ SOLN: 20.0000 mg | Freq: Once | INTRAMUSCULAR | Status: DC

## 2018-01-25 NOTE — Progress Notes (Signed)
Patient states, "I haven't took any of my pills in a week. I told Judeen Hammans." Vital signs reviewed with MD, Dr. [name]. Per MD order: Proceed with 2 units of pRBCs today. MD to place order for IV Lasix to be given in between 1st and 2nd unit of pRBCs.

## 2018-01-26 LAB — BPAM RBC
BLOOD PRODUCT EXPIRATION DATE: 201908222359
BLOOD PRODUCT EXPIRATION DATE: 201908272359
ISSUE DATE / TIME: 201908161226
ISSUE DATE / TIME: 201908161442
Unit Type and Rh: 600
Unit Type and Rh: 6200

## 2018-01-26 LAB — TYPE AND SCREEN
ABO/RH(D): A POS
ANTIBODY SCREEN: NEGATIVE
Unit division: 0
Unit division: 0

## 2018-02-01 ENCOUNTER — Inpatient Hospital Stay: Payer: Medicare Other | Admitting: Oncology

## 2018-02-01 ENCOUNTER — Inpatient Hospital Stay: Payer: Medicare Other

## 2018-02-07 ENCOUNTER — Encounter: Payer: Self-pay | Admitting: Oncology

## 2018-02-07 ENCOUNTER — Other Ambulatory Visit (INDEPENDENT_AMBULATORY_CARE_PROVIDER_SITE_OTHER): Payer: Self-pay | Admitting: Nurse Practitioner

## 2018-02-07 ENCOUNTER — Inpatient Hospital Stay: Payer: Medicare Other

## 2018-02-07 ENCOUNTER — Inpatient Hospital Stay (HOSPITAL_BASED_OUTPATIENT_CLINIC_OR_DEPARTMENT_OTHER): Payer: Medicare Other | Admitting: Oncology

## 2018-02-07 ENCOUNTER — Other Ambulatory Visit: Payer: Self-pay

## 2018-02-07 VITALS — BP 153/95 | HR 87 | Temp 97.5°F | Resp 18 | Ht 70.0 in | Wt 223.8 lb

## 2018-02-07 DIAGNOSIS — C7951 Secondary malignant neoplasm of bone: Secondary | ICD-10-CM | POA: Diagnosis not present

## 2018-02-07 DIAGNOSIS — C61 Malignant neoplasm of prostate: Secondary | ICD-10-CM

## 2018-02-07 DIAGNOSIS — C792 Secondary malignant neoplasm of skin: Secondary | ICD-10-CM

## 2018-02-07 DIAGNOSIS — D61818 Other pancytopenia: Secondary | ICD-10-CM | POA: Diagnosis not present

## 2018-02-07 DIAGNOSIS — C787 Secondary malignant neoplasm of liver and intrahepatic bile duct: Secondary | ICD-10-CM

## 2018-02-07 DIAGNOSIS — D649 Anemia, unspecified: Secondary | ICD-10-CM

## 2018-02-07 DIAGNOSIS — Z5112 Encounter for antineoplastic immunotherapy: Secondary | ICD-10-CM

## 2018-02-07 DIAGNOSIS — C779 Secondary and unspecified malignant neoplasm of lymph node, unspecified: Secondary | ICD-10-CM

## 2018-02-07 LAB — CBC WITH DIFFERENTIAL/PLATELET
BASOS ABS: 0 10*3/uL (ref 0–0.1)
BASOS PCT: 1 %
Eosinophils Absolute: 0 10*3/uL (ref 0–0.7)
Eosinophils Relative: 1 %
HCT: 22.1 % — ABNORMAL LOW (ref 40.0–52.0)
HEMOGLOBIN: 7.5 g/dL — AB (ref 13.0–18.0)
LYMPHS PCT: 10 %
Lymphs Abs: 0.5 10*3/uL — ABNORMAL LOW (ref 1.0–3.6)
MCH: 32.7 pg (ref 26.0–34.0)
MCHC: 34.1 g/dL (ref 32.0–36.0)
MCV: 96 fL (ref 80.0–100.0)
MONOS PCT: 12 %
Monocytes Absolute: 0.6 10*3/uL (ref 0.2–1.0)
NEUTROS ABS: 3.7 10*3/uL (ref 1.4–6.5)
NEUTROS PCT: 76 %
Platelets: 115 10*3/uL — ABNORMAL LOW (ref 150–440)
RBC: 2.31 MIL/uL — ABNORMAL LOW (ref 4.40–5.90)
RDW: 18.3 % — ABNORMAL HIGH (ref 11.5–14.5)
WBC: 4.8 10*3/uL (ref 3.8–10.6)

## 2018-02-07 LAB — COMPREHENSIVE METABOLIC PANEL
ALBUMIN: 2.6 g/dL — AB (ref 3.5–5.0)
ALK PHOS: 391 U/L — AB (ref 38–126)
ALT: 28 U/L (ref 0–44)
AST: 43 U/L — AB (ref 15–41)
Anion gap: 8 (ref 5–15)
BILIRUBIN TOTAL: 0.8 mg/dL (ref 0.3–1.2)
BUN: 14 mg/dL (ref 8–23)
CALCIUM: 8.2 mg/dL — AB (ref 8.9–10.3)
CO2: 27 mmol/L (ref 22–32)
CREATININE: 0.88 mg/dL (ref 0.61–1.24)
Chloride: 105 mmol/L (ref 98–111)
GFR calc Af Amer: >60 mL/min (ref >60)
GLUCOSE: 125 mg/dL — AB (ref 70–99)
Potassium: 4 mmol/L (ref 3.5–5.1)
Sodium: 140 mmol/L (ref 135–145)
TOTAL PROTEIN: 6.7 g/dL (ref 6.5–8.1)

## 2018-02-07 LAB — SAMPLE TO BLOOD BANK

## 2018-02-07 MED ORDER — SODIUM CHLORIDE 0.9 % IV SOLN
INTRAVENOUS | Status: DC
  Filled 2018-02-07: qty 250

## 2018-02-07 MED ORDER — ACETAMINOPHEN 325 MG PO TABS: 650.0000 mg | ORAL_TABLET | Freq: Once | ORAL | Status: DC

## 2018-02-07 NOTE — Progress Notes (Signed)
Unable to get a successful IV on patient, Dr. [name] notified.  No treatment today.

## 2018-02-07 NOTE — Progress Notes (Signed)
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 Mets to bone , liver, LN and skin   Chief complaint/ Reason for visit- on treatment assessment prior to cycle 2 of opdivo  Heme/Onc history: 1. patient is 72 year old male with a past medical history significant for hypertension diabetes and COPD among other medical problems. He was admitted to the hospital in October 2017 with some symptoms of dizziness and abdominal pain. CT angiogram abdomen incidentally showed sclerotic metastatic disease throughout the visualized spine.   2. This was followed by an MRI of the lumbar spine which showed multifocal T1 and T2 hypointense lesions with areas of increased signal on STIR sequence are seen in multiple vertebral bodies. Largest lesions are in T11 T12 L1 and in the imaged sacrum. This is consistent with metastatic disease. Primary lesion is not identified. MRI brain showed no acute intracranial abnormality.   3. Patient was noted to have an elevated PSA and was referred to urology as an outpatient. He was recently seen by Dr. Tresa Moore on 05/29/2016. PSA was repeated at that time which came back elevated at 10.3. 3 months over the value was 10.81.  4. Bone scan on 07/07/16 showed : Widespread radiotracer uptake over the axial and appendicular skeleton as described compatible with metastatic disease and correlating with sclerotic lesions on Ct. CT chest on 07/07/16 showed: Scattered sclerotic osseous metastatic lesions throughout spine, pelvis, proximal femora, ribs, and manubrium.  Prostatic enlargement with thickened bladder wall question related to chronic bladder outlet obstruction.  Spiculated 12 x 11 x 7 mm LEFT upper lobe nodule question primary pulmonary neoplasm. Stable nonspecific small LEFT adrenal nodule. Single minimally enlarged AP window lymph node. BILATERAL inguinal hernias containing fat. 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 Recommend annual imaging followup by CTA or MRA.   5. Anemia work up from 06/20/16 was as follows: CBC showed white count of 5.2, H&H of 8.1/25 and a platelet count of 164. CMP showed elevated alkaline phosphatase of 391. Multiple myeloma panel showed normal quantitative immunoglobulins and no monoclonal M protein.Vitamin B12 level was low normal at 225. LDH was mildly elevated at 234. Ferritin was low normal at 27. Reticulocyte count was 2.4% inappropriately low for the degree of anemia. Haptoglobin was elevated at 273.  6. Patients case was discussed at tumor board and plan was to watch the lung nodule with scans as it was in a difficult area to biopsy. CT guided bone biopsy was obtained which showed metastatic prostate cancer for which he is on lupron  7. Given that he does not have visceral or lymph node metastasis, docetaxel/zytigawas not started. Also patient lives alone and does not have a good social support or means of communication. Those options to be consideredat progression.   8. Bone scan on 12/22/2016 showed significantly worsening osseous metastatic disease. Left upper lobe nodule was slightly smaller in size. Zytiga was added in setting of worsening bone disease along with prednisonein aug 2018. Lupron administered 02/13/2017.  9. Scans after 3 months of zytiga showed progression of bone mets. PSA remains under control. Case discussed at  tumor board and plan was to hold zytiga and proceed with Xofigo for 6  months followed by repeat scans. xofigo started in jan 2019. Patientreceived 6 doses of xofigo. Last dose on 11/21/17  10. He was then found to have 2 new skin lesions- one showed SCC. 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 PET/CT scan on 12/11/17 showed:1. Examination is positive for extensive FDG avid, hypermetabolic liver metastases. 2. Widespread hypermetabolic sclerotic bone metastases. 3. Increased uptake within soft tissue in the prostate bed compatible with residual/recurrent hypermetabolic local tumor. 4. Hypermetabolic left-sided mediastinal and right hilar lymph nodes. Suspicious for nodal metastasis. 5. Aortic atherosclerosis and coronary artery atherosclerotic calcifications. Aortic Atherosclerosis (ICD10-I70.0).  12. Path shows high grade neuroendocrine carcinoma positive for PSA suggestive of prostate primary   Interval history-he did not come for his cycle 2 of Opdivo on 01/29/2018 as planned as he was experiencing more leg swelling and did not want to get out of his house.  Today he reports that his leg swelling is better but it persists.  He still feels fatigued.  He lives alone and does not have access to cell phone.  Appetite has been fair and he tries to eat what he can and goes for grocery shopping at times.  ECOG PS- 2 Pain scale- 0 Opioid associated constipation- no  Review of systems- Review of Systems  Constitutional: Positive for malaise/fatigue. Negative for chills, fever and weight loss.  HENT: Negative for congestion, ear discharge and nosebleeds.   Eyes: Negative for blurred vision.  Respiratory: Negative for cough, hemoptysis, sputum production, shortness of breath and wheezing.   Cardiovascular: Positive for leg swelling. Negative for chest pain, palpitations, orthopnea and claudication.  Gastrointestinal: Negative for abdominal pain, blood in stool, constipation, diarrhea, heartburn,  melena, nausea and vomiting.  Genitourinary: Negative for dysuria, flank pain, frequency, hematuria and urgency.  Musculoskeletal: Negative for back pain, joint pain and myalgias.  Skin: Negative for rash.  Neurological: Negative for dizziness, tingling, focal weakness, seizures, weakness and headaches.  Endo/Heme/Allergies: Does not bruise/bleed easily.  Psychiatric/Behavioral: Negative for depression and suicidal ideas. The patient does not have insomnia.       No Known Allergies   Past Medical History:  Diagnosis Date  . Anemia   . Arthritis   . Cancer [facility])    prostate  . COPD (chronic obstructive pulmonary disease) ([place])   . Coronary artery disease   . Cough    chronic  . Diabetes mellitus without complication ([place])   . Edema   . Elevated PSA   . Headache   . Hypertension   . Orthopnea   . Oxygen deficit    o2 prn  . Prostate cancer ([place])   . Shortness of breath dyspnea   . Sleep apnea    cpap     Past Surgical History:  Procedure Laterality Date  . CARDIAC CATHETERIZATION  2014  . COLONOSCOPY    . CYSTOSCOPY W/ RETROGRADES Bilateral 10/18/2016   Procedure: CYSTOSCOPY WITH RETROGRADE PYELOGRAM;  Surgeon: Hollice Espy, MD;  Location: [facility] ORS;  Service: Urology;  Laterality: Bilateral;  . EYE SURGERY    . KNEE ARTHROSCOPY    . TRANSURETHRAL RESECTION OF BLADDER TUMOR N/A 10/18/2016   Procedure: TRANSURETHRAL RESECTION OF BLADDER TUMOR (TURBT);  Surgeon: Hollice Espy, MD;  Location: [facility] ORS;  Service: Urology;  Laterality: N/A;  . TRANSURETHRAL RESECTION OF PROSTATE N/A 10/18/2016   Procedure: TRANSURETHRAL RESECTION OF THE PROSTATE (TURP) CHANNEL  TURP;  Surgeon: Hollice Espy, MD;  Location: [facility] ORS;  Service: Urology;  Laterality: N/A;    Social History   Socioeconomic History  . Marital status: Single    Spouse name: Not on file  . Number of children: Not on file  . Years of education: Not on file  . Highest education level: Not on file    Occupational History  . Not on file  Social Needs  . Financial resource strain: Not on file  . Food insecurity:    Worry: Not on file    Inability: Not on file  . Transportation needs:    Medical: Not on file    Non-medical: Not on file  Tobacco Use  . Smoking status: Current Every Day Smoker    Packs/day: 0.50    Years: 55.00    Pack years: 27.50    Types: Cigarettes  . Smokeless tobacco: Never Used  Substance and Sexual Activity  . Alcohol use: No  . Drug use: No  . Sexual activity: Not Currently  Lifestyle  . Physical activity:    Days per week: Not on file    Minutes per session: Not on file  . Stress: Not on file  Relationships  . Social connections:    Talks on phone: Not on file    Gets together: Not on file    Attends religious service: Not on file    Active member of club or organization: Not on file    Attends meetings of clubs or organizations: Not on file    Relationship status: Not on file  . Intimate partner violence:    Fear of current or ex partner: Not on file    Emotionally abused: Not on file    Physically abused: Not on file    Forced sexual activity: Not on file  Other Topics Concern  . Not on file  Social History Narrative  . Not on file    Family History  Problem Relation Age of Onset  . CVA Mother   . CAD Father      Current Outpatient Medications:  .  amiodarone (PACERONE) 200 MG tablet, Take 1 tablet (200 mg total) by mouth 2 (two) times daily., Disp: 60 tablet, Rfl: 0 .  atorvastatin (LIPITOR) 40 MG tablet, Take 1 tablet (40 mg total) by mouth daily., Disp: 30 tablet, Rfl: 0 .  DULoxetine (CYMBALTA) 30 MG capsule, , Disp: , Rfl:  .  ferrous sulfate 325 (65 FE) MG EC tablet, Take 1 tablet (325 mg total) by mouth 2 (two) times daily with a meal., Disp: 60 tablet, Rfl: 3 .  finasteride (PROSCAR) 5 MG tablet, Take 5 mg by mouth daily., Disp: , Rfl:  .  Fluticasone-Salmeterol (ADVAIR) 250-50 MCG/DOSE AEPB, Inhale 1 puff into the lungs  2 (two) times daily., Disp: , Rfl:  .  metoprolol tartrate (LOPRESSOR) 100 MG tablet, Take 1 tablet (100 mg total) by mouth 2 (two) times daily., Disp: 60 tablet, Rfl: 0 .  OLANZapine (ZYPREXA) 10 MG tablet, Take 1 tablet (10 mg total) by mouth at bedtime., Disp: 30 tablet, Rfl: 0 .  pantoprazole (PROTONIX) 40 MG tablet, Take 1 tablet (40 mg total) by mouth daily., Disp: 30 tablet, Rfl: 0 .  tamsulosin (FLOMAX) 0.4 MG CAPS capsule, Take 1 capsule (0.4 mg total) by mouth daily., Disp: 90 capsule, Rfl: 3 .  vitamin B-12 (CYANOCOBALAMIN) 1000 MCG tablet, Take 1 tablet (1,000 mcg total) by mouth daily., Disp: 30 tablet, Rfl: 3 .  ketoconazole (NIZORAL) 2 % cream, Apply 1 application topically daily as needed. , Disp: , Rfl:  .  lidocaine-prilocaine (EMLA) cream, Apply to affected area once (Patient not taking: Reported on 01/14/2018), Disp: 30 g, Rfl: 3 .  nicotine (NICODERM CQ - DOSED IN MG/24 HOURS) 14 mg/24hr patch, Place 1 patch (14 mg total) onto the skin daily as needed (nicotine craving). Okay to substitute generic (Patient not taking: Reported on 01/14/2018), Disp: 28 patch, Rfl: 0 .  ondansetron (ZOFRAN) 4 MG tablet, Take 1 tablet (4 mg total) by mouth every 8 (eight) hours as needed for nausea or vomiting. (Patient not taking: Reported on 02/07/2018), Disp: 20 tablet, Rfl: 0 No current facility-administered medications for this visit.   Facility-Administered Medications Ordered in Other Visits:  .  0.9 %  sodium chloride infusion, , Intravenous, Continuous, Sindy Guadeloupe, MD .  acetaminophen (TYLENOL) tablet 650 mg, 650 mg, Oral, Once, Sindy Guadeloupe, MD  Physical exam:  Vitals:   02/07/18 0902  BP: (!) 153/95  Pulse: 87  Resp: 18  Temp: (!) 97.5 F (36.4 C)  TempSrc: Tympanic  Weight: 223 lb 12.8 oz (101.5 kg)  Height: 5' 10"  (1.778 m)   Physical Exam  Constitutional: He is oriented to person, place, and time.  He appears fatigued and pale.  HENT:  Head: Normocephalic and  atraumatic.  Eyes: Pupils are equal, round, and reactive to light. EOM are normal.  Neck: Normal range of motion.  Cardiovascular: Normal rate, regular rhythm and normal heart sounds.  Pulmonary/Chest: Effort normal and breath sounds normal.  Abdominal: Soft. Bowel sounds are normal.  Musculoskeletal: He exhibits edema.  Neurological: He is alert and oriented to person, place, and time.  Skin: Skin is warm and dry.     CMP Latest Ref Rng & Units 02/07/2018  Glucose 70 - 99 mg/dL 125(H)  BUN 8 - 23 mg/dL 14  Creatinine 0.61 - 1.24 mg/dL 0.88  Sodium 135 - 145 mmol/L 140  Potassium 3.5 - 5.1 mmol/L 4.0  Chloride 98 - 111 mmol/L 105  CO2 22 - 32 mmol/L 27  Calcium 8.9 - 10.3 mg/dL 8.2(L)  Total Protein 6.5 - 8.1 g/dL 6.7  Total Bilirubin 0.3 - 1.2 mg/dL 0.8  Alkaline Phos 38 - 126 U/L 391(H)  AST 15 - 41 U/L 43(H)  ALT 0 - 44 U/L 28   CBC Latest Ref Rng & Units 02/07/2018  WBC 3.8 - 10.6 K/uL 4.8  Hemoglobin 13.0 - 18.0 g/dL 7.5(L)  Hematocrit 40.0 - 52.0 % 22.1(L)  Platelets 150 - 440 K/uL 115(L)      Assessment and plan- Patient is a 72 y.o. 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 Mets to bone , liver, LN and skin.  He is here for on treatment assessment prior to cycle 2 of Opdivo  His hemoglobin is down to 7.5 today and he needs 1 unit of blood transfusion.  He can also proceed with cycle 2 of Opdivo today.  However his venous access is poor and we are unable to get a good peripheral line for his treatment.  We have therefore scheduled him for port placement tomorrow by vascular surgery.  His port will be left accessed and he will come to the cancer center after port placement to get his 1 unit of PRBC and for his next cycle of Opdivo tomorrow.  Leg swelling: Secondary to malignancy causing hypoalbuminemia.  Lasix is unlikely to help and  we will continue to monitor this.  Overall his prognosis from  high-grade neuroendocrine carcinoma is poor.  We again discussed hospice would be a consideration if he does not respond to [place].  It would be difficult to give him chemotherapy with his ongoing pancytopenia and poor performance status.  He is willing to have a hospice informational meter at this time which we will arrange.  He has no social support and lives alone in a trailer and has no access to cell phone which makes medical care challenging  He will return to clinic in 1 week with a CBC for possible blood transfusion.  I will see him back in 2 weeks time with CBC and CMP for cycle 3 of Opdivo.  Plan is to repeat scans after about 5-6 cycles   Visit Diagnosis 1. Symptomatic anemia   2. Prostate cancer metastatic to bone ([place])   3. Encounter for antineoplastic immunotherapy      Dr. Randa Evens, MD, MPH [facility] [phone] 02/07/2018 12:16 PM

## 2018-02-07 NOTE — Progress Notes (Signed)
No new changes noted today 

## 2018-02-11 LAB — BPAM RBC
BLOOD PRODUCT EXPIRATION DATE: 201909022359
Blood Product Expiration Date: 201909232359
Blood Product Expiration Date: 201909232359
ISSUE DATE / TIME: 201908301253
ISSUE DATE / TIME: 201908301944
UNIT TYPE AND RH: 600
Unit Type and Rh: 6200
Unit Type and Rh: 6200

## 2018-02-11 LAB — TYPE AND SCREEN
ABO/RH(D): A POS
Antibody Screen: NEGATIVE
UNIT DIVISION: 0
Unit division: 0
Unit division: 0

## 2018-02-11 LAB — PREPARE RBC (CROSSMATCH)

## 2018-02-12 ENCOUNTER — Telehealth: Payer: Self-pay | Admitting: *Deleted

## 2018-02-12 NOTE — Telephone Encounter (Signed)
Went to pt's house and told him about portacath insertions 9/4. Will need to be a medical mall tom.at 11:30 and check in at registration desk. Nothing to et or drink after midnight. Take meds with sip of water. Have a driver to and from for procedure. Patient told me to talk to his neighbor and her name is Mia Creek and number is [phone] and I spoek to her and she is more than willing to take pt and to come pick him back up. I called [facility] and she is working in the vascular lab tom. And I went over the situation where pt  Does not have a phone and I have spoke to his neighbor Butch Penny ans she is agreeable to be his transportation. The name and number was given to [facility] to call neighbor when he is ready to be discharged.

## 2018-02-13 ENCOUNTER — Other Ambulatory Visit: Payer: Self-pay

## 2018-02-13 ENCOUNTER — Encounter: Payer: Self-pay | Admitting: Internal Medicine

## 2018-02-13 ENCOUNTER — Inpatient Hospital Stay
Admission: RE | Admit: 2018-02-13 | Discharge: 2018-02-14 | DRG: 981 | Disposition: A | Discharge status: Home / Self Care | Payer: Medicare Other | Attending: Internal Medicine | Admitting: Internal Medicine

## 2018-02-13 ENCOUNTER — Ambulatory Visit: Payer: Medicare Other

## 2018-02-13 ENCOUNTER — Encounter
Admission: RE | Disposition: A | Discharge status: Home / Self Care | Payer: Self-pay | Source: Home / Self Care | Attending: Internal Medicine

## 2018-02-13 DIAGNOSIS — Z6832 Body mass index (BMI) 32.0-32.9, adult: Secondary | ICD-10-CM

## 2018-02-13 DIAGNOSIS — E119 Type 2 diabetes mellitus without complications: Secondary | ICD-10-CM | POA: Diagnosis not present

## 2018-02-13 DIAGNOSIS — F17210 Nicotine dependence, cigarettes, uncomplicated: Secondary | ICD-10-CM | POA: Diagnosis present

## 2018-02-13 DIAGNOSIS — Z823 Family history of stroke: Secondary | ICD-10-CM

## 2018-02-13 DIAGNOSIS — Z79899 Other long term (current) drug therapy: Secondary | ICD-10-CM

## 2018-02-13 DIAGNOSIS — Z9989 Dependence on other enabling machines and devices: Secondary | ICD-10-CM

## 2018-02-13 DIAGNOSIS — J441 Chronic obstructive pulmonary disease with (acute) exacerbation: Secondary | ICD-10-CM | POA: Diagnosis not present

## 2018-02-13 DIAGNOSIS — G4730 Sleep apnea, unspecified: Secondary | ICD-10-CM | POA: Diagnosis not present

## 2018-02-13 DIAGNOSIS — D630 Anemia in neoplastic disease: Secondary | ICD-10-CM | POA: Diagnosis present

## 2018-02-13 DIAGNOSIS — N400 Enlarged prostate without lower urinary tract symptoms: Secondary | ICD-10-CM | POA: Diagnosis not present

## 2018-02-13 DIAGNOSIS — Z716 Tobacco abuse counseling: Secondary | ICD-10-CM

## 2018-02-13 DIAGNOSIS — J9601 Acute respiratory failure with hypoxia: Secondary | ICD-10-CM | POA: Diagnosis present

## 2018-02-13 DIAGNOSIS — I2510 Atherosclerotic heart disease of native coronary artery without angina pectoris: Secondary | ICD-10-CM | POA: Diagnosis not present

## 2018-02-13 DIAGNOSIS — Z66 Do not resuscitate: Secondary | ICD-10-CM | POA: Diagnosis not present

## 2018-02-13 DIAGNOSIS — C7951 Secondary malignant neoplasm of bone: Secondary | ICD-10-CM | POA: Diagnosis present

## 2018-02-13 DIAGNOSIS — F329 Major depressive disorder, single episode, unspecified: Secondary | ICD-10-CM | POA: Diagnosis present

## 2018-02-13 DIAGNOSIS — D62 Acute posthemorrhagic anemia: Secondary | ICD-10-CM | POA: Diagnosis present

## 2018-02-13 DIAGNOSIS — Z7951 Long term (current) use of inhaled steroids: Secondary | ICD-10-CM

## 2018-02-13 DIAGNOSIS — I4891 Unspecified atrial fibrillation: Secondary | ICD-10-CM | POA: Diagnosis present

## 2018-02-13 DIAGNOSIS — Z23 Encounter for immunization: Secondary | ICD-10-CM | POA: Diagnosis not present

## 2018-02-13 DIAGNOSIS — C61 Malignant neoplasm of prostate: Principal | ICD-10-CM | POA: Diagnosis present

## 2018-02-13 DIAGNOSIS — I5033 Acute on chronic diastolic (congestive) heart failure: Secondary | ICD-10-CM | POA: Diagnosis present

## 2018-02-13 DIAGNOSIS — R0602 Shortness of breath: Secondary | ICD-10-CM

## 2018-02-13 DIAGNOSIS — Z9079 Acquired absence of other genital organ(s): Secondary | ICD-10-CM

## 2018-02-13 DIAGNOSIS — Z9981 Dependence on supplemental oxygen: Secondary | ICD-10-CM

## 2018-02-13 DIAGNOSIS — Z906 Acquired absence of other parts of urinary tract: Secondary | ICD-10-CM | POA: Diagnosis not present

## 2018-02-13 DIAGNOSIS — R0902 Hypoxemia: Secondary | ICD-10-CM

## 2018-02-13 DIAGNOSIS — I110 Hypertensive heart disease with heart failure: Secondary | ICD-10-CM | POA: Diagnosis not present

## 2018-02-13 DIAGNOSIS — Z8249 Family history of ischemic heart disease and other diseases of the circulatory system: Secondary | ICD-10-CM

## 2018-02-13 HISTORY — PX: PORTA CATH INSERTION: CATH118285

## 2018-02-13 LAB — BASIC METABOLIC PANEL
Anion gap: 8 (ref 5–15)
BUN: 14 mg/dL (ref 8–23)
CHLORIDE: 105 mmol/L (ref 98–111)
CO2: 28 mmol/L (ref 22–32)
Calcium: 7.8 mg/dL — ABNORMAL LOW (ref 8.9–10.3)
Creatinine, Ser: 0.74 mg/dL (ref 0.61–1.24)
GFR calc Af Amer: >60 mL/min (ref >60)
GFR calc non Af Amer: >60 mL/min (ref >60)
Glucose, Bld: 130 mg/dL — ABNORMAL HIGH (ref 70–99)
Potassium: 3.8 mmol/L (ref 3.5–5.1)
Sodium: 141 mmol/L (ref 135–145)

## 2018-02-13 LAB — CBC
HEMATOCRIT: 17.7 % — AB (ref 40.0–52.0)
HEMOGLOBIN: 6 g/dL — AB (ref 13.0–18.0)
MCH: 31.9 pg (ref 26.0–34.0)
MCHC: 33.6 g/dL (ref 32.0–36.0)
MCV: 95.2 fL (ref 80.0–100.0)
Platelets: 75 10*3/uL — ABNORMAL LOW (ref 150–440)
RBC: 1.86 MIL/uL — ABNORMAL LOW (ref 4.40–5.90)
RDW: 18.5 % — ABNORMAL HIGH (ref 11.5–14.5)
WBC: 5.8 10*3/uL (ref 3.8–10.6)

## 2018-02-13 LAB — PREPARE RBC (CROSSMATCH)

## 2018-02-13 LAB — GLUCOSE, CAPILLARY
GLUCOSE-CAPILLARY: 103 mg/dL — AB (ref 70–99)
Glucose-Capillary: 119 mg/dL — ABNORMAL HIGH (ref 70–99)

## 2018-02-13 SURGERY — PORTA CATH INSERTION
Anesthesia: Moderate Sedation

## 2018-02-13 MED ORDER — IPRATROPIUM-ALBUTEROL 0.5-2.5 (3) MG/3ML IN SOLN
3.0000 mL | Freq: Four times a day (QID) | RESPIRATORY_TRACT | Status: DC
  Administered 2018-02-13 – 2018-02-14 (×4): 3 mL via RESPIRATORY_TRACT
  Filled 2018-02-13 (×3): qty 3

## 2018-02-13 MED ORDER — METHYLPREDNISOLONE SODIUM SUCC 125 MG IJ SOLR
INTRAMUSCULAR | Status: AC
  Administered 2018-02-13: 125 mg via INTRAVENOUS
  Filled 2018-02-13: qty 2

## 2018-02-13 MED ORDER — HEPARIN (PORCINE) IN NACL 1000-0.9 UT/500ML-% IV SOLN
INTRAVENOUS | Status: AC
  Filled 2018-02-13: qty 1000

## 2018-02-13 MED ORDER — MIDAZOLAM HCL 2 MG/2ML IJ SOLN
INTRAMUSCULAR | Status: DC | PRN
  Administered 2018-02-13: 2 mg via INTRAVENOUS
  Administered 2018-02-13 (×2): 1 mg via INTRAVENOUS

## 2018-02-13 MED ORDER — METOPROLOL TARTRATE 50 MG PO TABS
100.0000 mg | ORAL_TABLET | Freq: Two times a day (BID) | ORAL | Status: DC
  Administered 2018-02-13 – 2018-02-14 (×2): 100 mg via ORAL
  Filled 2018-02-13 (×2): qty 2

## 2018-02-13 MED ORDER — DULOXETINE HCL 30 MG PO CPEP
30.0000 mg | ORAL_CAPSULE | Freq: Every day | ORAL | Status: DC
  Administered 2018-02-14: 09:00:00 30 mg via ORAL
  Filled 2018-02-13: qty 1

## 2018-02-13 MED ORDER — PANTOPRAZOLE SODIUM 40 MG PO TBEC
40.0000 mg | DELAYED_RELEASE_TABLET | Freq: Every day | ORAL | Status: DC
  Administered 2018-02-13 – 2018-02-14 (×2): 40 mg via ORAL
  Filled 2018-02-13 (×2): qty 1

## 2018-02-13 MED ORDER — LIDOCAINE-EPINEPHRINE (PF) 1 %-1:200000 IJ SOLN
INTRAMUSCULAR | Status: AC
  Filled 2018-02-13: qty 30

## 2018-02-13 MED ORDER — CEFAZOLIN SODIUM-DEXTROSE 2-4 GM/100ML-% IV SOLN
2.0000 g | Freq: Once | INTRAVENOUS | Status: AC
  Administered 2018-02-13: 2 g via INTRAVENOUS

## 2018-02-13 MED ORDER — BUDESONIDE 0.5 MG/2ML IN SUSP
0.5000 mg | Freq: Two times a day (BID) | RESPIRATORY_TRACT | Status: DC
  Administered 2018-02-13 – 2018-02-14 (×2): 0.5 mg via RESPIRATORY_TRACT
  Filled 2018-02-13 (×2): qty 2

## 2018-02-13 MED ORDER — FUROSEMIDE 10 MG/ML IJ SOLN
INTRAMUSCULAR | Status: AC
  Administered 2018-02-13: 40 mg via INTRAVENOUS
  Filled 2018-02-13: qty 4

## 2018-02-13 MED ORDER — ONDANSETRON HCL 4 MG PO TABS
4.0000 mg | ORAL_TABLET | Freq: Four times a day (QID) | ORAL | Status: DC | PRN
  Filled 2018-02-13: qty 1

## 2018-02-13 MED ORDER — FENTANYL CITRATE (PF) 100 MCG/2ML IJ SOLN
INTRAMUSCULAR | Status: DC | PRN
  Administered 2018-02-13: 50 ug via INTRAVENOUS
  Administered 2018-02-13 (×2): 25 ug via INTRAVENOUS

## 2018-02-13 MED ORDER — FUROSEMIDE 10 MG/ML IJ SOLN
40.0000 mg | Freq: Once | INTRAMUSCULAR | Status: AC
  Administered 2018-02-13: 40 mg via INTRAVENOUS
  Filled 2018-02-13: qty 4

## 2018-02-13 MED ORDER — ACETAMINOPHEN 650 MG RE SUPP
650.0000 mg | Freq: Four times a day (QID) | RECTAL | Status: DC | PRN
  Filled 2018-02-13: qty 1

## 2018-02-13 MED ORDER — FUROSEMIDE 10 MG/ML IJ SOLN
40.0000 mg | Freq: Once | INTRAMUSCULAR | Status: AC
  Administered 2018-02-13: 40 mg via INTRAVENOUS

## 2018-02-13 MED ORDER — TAMSULOSIN HCL 0.4 MG PO CAPS
0.4000 mg | ORAL_CAPSULE | Freq: Every day | ORAL | Status: DC
  Administered 2018-02-13 – 2018-02-14 (×2): 0.4 mg via ORAL
  Filled 2018-02-13 (×2): qty 1

## 2018-02-13 MED ORDER — SODIUM CHLORIDE 0.9% FLUSH
10.0000 mL | INTRAVENOUS | Status: AC | PRN
  Administered 2018-02-13: 10 mL

## 2018-02-13 MED ORDER — METHYLPREDNISOLONE SODIUM SUCC 125 MG IJ SOLR
40.0000 mg | Freq: Two times a day (BID) | INTRAMUSCULAR | Status: DC
  Administered 2018-02-14: 07:00:00 40 mg via INTRAVENOUS
  Filled 2018-02-13: qty 2

## 2018-02-13 MED ORDER — DEXTROSE 5 % IV SOLN: 2.0000 g | Freq: Once | INTRAVENOUS | Status: DC

## 2018-02-13 MED ORDER — AMIODARONE HCL 200 MG PO TABS
200.0000 mg | ORAL_TABLET | Freq: Every day | ORAL | Status: DC
  Administered 2018-02-14: 09:00:00 200 mg via ORAL
  Filled 2018-02-13: qty 1

## 2018-02-13 MED ORDER — MIDAZOLAM HCL 5 MG/5ML IJ SOLN
INTRAMUSCULAR | Status: AC
  Filled 2018-02-13: qty 5

## 2018-02-13 MED ORDER — METHYLPREDNISOLONE SODIUM SUCC 125 MG IJ SOLR
125.0000 mg | Freq: Once | INTRAMUSCULAR | Status: AC
  Administered 2018-02-13: 125 mg via INTRAVENOUS

## 2018-02-13 MED ORDER — FUROSEMIDE 10 MG/ML IJ SOLN
20.0000 mg | Freq: Two times a day (BID) | INTRAMUSCULAR | Status: DC
  Administered 2018-02-14: 20 mg via INTRAVENOUS
  Filled 2018-02-13: qty 2

## 2018-02-13 MED ORDER — ACETAMINOPHEN 325 MG PO TABS
650.0000 mg | ORAL_TABLET | Freq: Once | ORAL | Status: AC
  Administered 2018-02-13: 650 mg via ORAL
  Filled 2018-02-13: qty 2

## 2018-02-13 MED ORDER — ACETAMINOPHEN 325 MG PO TABS: 650.0000 mg | ORAL_TABLET | Freq: Four times a day (QID) | ORAL | Status: DC | PRN

## 2018-02-13 MED ORDER — HYDRALAZINE HCL 20 MG/ML IJ SOLN
INTRAMUSCULAR | Status: DC | PRN
  Administered 2018-02-13: 10 mg via INTRAVENOUS

## 2018-02-13 MED ORDER — FENTANYL CITRATE (PF) 100 MCG/2ML IJ SOLN
INTRAMUSCULAR | Status: AC
  Filled 2018-02-13: qty 2

## 2018-02-13 MED ORDER — IPRATROPIUM-ALBUTEROL 0.5-2.5 (3) MG/3ML IN SOLN
RESPIRATORY_TRACT | Status: AC
  Filled 2018-02-13: qty 3

## 2018-02-13 MED ORDER — INFLUENZA VAC SPLIT HIGH-DOSE 0.5 ML IM SUSY
0.5000 mL | PREFILLED_SYRINGE | INTRAMUSCULAR | Status: AC
  Administered 2018-02-14: 14:00:00 0.5 mL via INTRAMUSCULAR
  Filled 2018-02-13: qty 0.5

## 2018-02-13 MED ORDER — SODIUM CHLORIDE 0.9% IV SOLUTION
Freq: Once | INTRAVENOUS | Status: AC
  Administered 2018-02-13: 18:00:00 via INTRAVENOUS

## 2018-02-13 MED ORDER — VITAMIN B-12 1000 MCG PO TABS
1000.0000 ug | ORAL_TABLET | Freq: Every day | ORAL | Status: DC
  Administered 2018-02-14: 1000 ug via ORAL
  Filled 2018-02-13: qty 1

## 2018-02-13 MED ORDER — SODIUM CHLORIDE 0.9 % IV SOLN: INTRAVENOUS | Status: DC

## 2018-02-13 MED ORDER — FINASTERIDE 5 MG PO TABS
5.0000 mg | ORAL_TABLET | Freq: Every day | ORAL | Status: DC
  Administered 2018-02-14: 09:00:00 5 mg via ORAL
  Filled 2018-02-13: qty 1

## 2018-02-13 MED ORDER — HYDRALAZINE HCL 20 MG/ML IJ SOLN
INTRAMUSCULAR | Status: AC
  Filled 2018-02-13: qty 1

## 2018-02-13 MED ORDER — ONDANSETRON HCL 4 MG/2ML IJ SOLN: 4.0000 mg | Freq: Four times a day (QID) | INTRAMUSCULAR | Status: DC | PRN

## 2018-02-13 SURGICAL SUPPLY — 11 items
DERMABOND ADVANCED (GAUZE/BANDAGES/DRESSINGS) ×2
DERMABOND ADVANCED .7 DNX12 (GAUZE/BANDAGES/DRESSINGS) ×1 IMPLANT
DRAPE INCISE IOBAN 66X45 STRL (DRAPES) ×3 IMPLANT
KIT PORT POWER 8FR ISP CVUE (Port) ×3 IMPLANT
NEEDLE ENTRY 21GA 7CM ECHOTIP (NEEDLE) ×3 IMPLANT
PACK ANGIOGRAPHY (CUSTOM PROCEDURE TRAY) ×3 IMPLANT
SET INTRO CAPELLA COAXIAL (SET/KITS/TRAYS/PACK) ×3 IMPLANT
SUT MNCRL AB 4-0 PS2 18 (SUTURE) ×3 IMPLANT
SUT PROLENE 0 CT 1 30 (SUTURE) ×3 IMPLANT
SUT VIC AB 3-0 SH 27 (SUTURE) ×2
SUT VIC AB 3-0 SH 27X BRD (SUTURE) ×1 IMPLANT

## 2018-02-13 NOTE — Progress Notes (Signed)
Patient ID: Michael Mathews, male   DOB: 11-20-1945, 72 y.o.   MRN: [id number]  ACP note Patient present  Diagnosis: Acute hypoxic respiratory failure, COPD exacerbation, acute on chronic diastolic congestive heart failure, acute on chronic anemia, morbid obesity, prostate cancer metastatic to bone, history of atrial fibrillation, BPH, depression.  CODE STATUS discussed and patient wishes to be a DO NOT RESUSCITATE  Plan.  Oxygen supplementation, Solu-Medrol and nebulizer treatments for COPD exacerbation IV Lasix for heart failure.  Time spent on ACP discussion 17 minutes Dr. Loletha Grayer

## 2018-02-13 NOTE — Op Note (Signed)
OPERATIVE NOTE   PROCEDURE: 1. Placement of a right IJ Infuse-a-Port  PRE-OPERATIVE DIAGNOSIS: Metastatic prostate carcinoma  POST-OPERATIVE DIAGNOSIS: Same  SURGEON: Katha Cabal M.D.  ANESTHESIA: Conscious sedation was administered under my direct supervision by the interventional radiology RN. IV Versed plus fentanyl were utilized. Continuous ECG, pulse oximetry and blood pressure was monitored throughout the entire procedure. Conscious sedation was for a total of 31 minutes.  ESTIMATED BLOOD LOSS: Minimal   FINDING(S): 1.  Patent vein  SPECIMEN(S): None  INDICATIONS:   Michael Mathews is a 72 y.o. male who presents with metastatic prostate carcinoma.  He will be starting chemotherapy and therefore requires appropriate intravenous access.  Risks and benefits for Infuse-a-Port placement of been reviewed all questions been answered patient is agreed to proceed.  DESCRIPTION: After obtaining full informed written consent, the patient was brought back to the special procedure suite and placed in the supine position. The patient's right neck and chest wall are prepped and draped in sterile fashion. Appropriate timeout was called.  Ultrasound is placed in a sterile sleeve, ultrasound is utilized to avoid vascular injury as well as secondary to lack of appropriate landmarks. The right IJ vein is identified. It is echolucent and homogeneous as well as easily compressible indicating patency. An image is recorded for the permanent record.  Access to the vein with a micropuncture needle is done under direct ultrasound visualization.  1% lidocaine is infiltrated into the soft tissue at the base of the neck as well as on the chest wall.  Under direct ultrasound visualization a micro-needle is inserted into the vein followed by the micro-wire. Micro-sheath was then advanced and a J wire is inserted without difficulty under fluoroscopic guidance. A small counterincision was created at the wire  insertion site. A transverse incision is created 2 fingerbreadths below the scapula and a pocket is fashioned using both blunt and sharp dissection. The pocket is tested for appropriate size with the hub of the Infuse-a-Port. The tunneling device is then used to pull the intravascular portion of the catheter from the pocket to the neck counterincision.  Dilator and peel-away sheath were then inserted over the wire and the wire is removed. Catheter is then advanced into the venous system without difficulty. Peel-away sheath was then removed.  Catheter is then positioned under fluoroscopic guidance at the atrial caval junction. It is then transected connected to the hub and the hope is slipped into the subcutaneous pocket on the chest wall. The hub was then accessed percutaneously and aspirates easily and flushes well and is flushed with 30 cc of heparinized saline. The pocket incision is then closed in layers using interrupted 3-0 Vicryl for the subcutaneous tissues and 4-0 Monocryl subcuticular for skin closure. Dermabond is applied. The neck counterincision was closed with 4-0 Monocryl subcuticular and Dermabond as well.  The patient tolerated the procedure well and there were no immediate complications.  COMPLICATIONS: None  CONDITION: Unchanged  Katha Cabal M.D. [facility] Office: [phone]   02/13/2018, 1:41 PM

## 2018-02-13 NOTE — Progress Notes (Signed)
Patient ID: Michael Mathews, male   DOB: 07-Sep-1945, 72 y.o.   MRN: [id number]  Hemoglobin 6.0.  Spoke with patient and nursing staff about blood transfusion.  Benefits and risks of transfusion explained.  We will give Lasix after the blood.  Dr. Loletha Grayer

## 2018-02-13 NOTE — Progress Notes (Signed)
Notified Dr Delana Meyer of [place] baseline O2 sat for patient, work of breathing, current vital signs. Order to consult hospitalist.

## 2018-02-13 NOTE — H&P (Signed)
[facility] History & Physical Update  The patient was interviewed and re-examined.  The patient's previous History and Physical has been reviewed and is unchanged.  The patient has metastatic prostate carcinoma and will require chemotherapy and therefore requires appropriate intravenous access.  Infuse-a-Port has been recommended.  The patient is otherwise stable and unchanged since his visit with Dr. [name].  There is no change in the plan of care. We plan to proceed with the scheduled procedure.  Hortencia Pilar, MD  02/13/2018, 12:14 PM

## 2018-02-13 NOTE — Progress Notes (Signed)
Patient to [facility] recovery with marginal oxygen saturation on room air. Patient states that he does not wear O2 at home, and according to charting had marginal O2 saturation upon arrival to pre-procedure fr port placement. Unable to find previous charting on O2 saturation, called cancer center and was informed that patient's last record was 97% on room air. Will notify Dr Delana Meyer.

## 2018-02-13 NOTE — H&P (Addendum)
[facility] [place] NAME: Michael Mathews    MR#:  [id number]  DATE OF BIRTH:  Sep 13, 1945  DATE OF ADMISSION:  02/13/2018  PRIMARY CARE PHYSICIAN: [name], MD   REQUESTING/REFERRING PHYSICIAN: Dr Ronalee Belts  CHIEF COMPLAINT:  Called for hypoxia  HISTORY OF PRESENT ILLNESS:  Michael Mathews  is a 72 y.o. male with history of anemia.  Patient had a elective port placement.  After the port placement he was found to be hypoxic with a pulse ox in the 80s.  The patient does have some shortness of breath and some cough.  He has a history of COPD.  Patient is still a current smoker of 1/2 pack/day.  PAST MEDICAL HISTORY:   Past Medical History:  Diagnosis Date  . Anemia   . Arthritis   . Cancer [facility])    prostate  . COPD (chronic obstructive pulmonary disease) ([place])   . Coronary artery disease   . Cough    chronic  . Diabetes mellitus without complication ([place])   . Edema   . Elevated PSA   . Headache   . Hypertension   . Orthopnea   . Oxygen deficit    o2 prn  . Prostate cancer ([place])   . Shortness of breath dyspnea   . Sleep apnea    cpap    PAST SURGICAL HISTORY:   Past Surgical History:  Procedure Laterality Date  . CARDIAC CATHETERIZATION  2014  . COLONOSCOPY    . CYSTOSCOPY W/ RETROGRADES Bilateral 10/18/2016   Procedure: CYSTOSCOPY WITH RETROGRADE PYELOGRAM;  Surgeon: Hollice Espy, MD;  Location: [facility] ORS;  Service: Urology;  Laterality: Bilateral;  . EYE SURGERY    . KNEE ARTHROSCOPY    . TRANSURETHRAL RESECTION OF BLADDER TUMOR N/A 10/18/2016   Procedure: TRANSURETHRAL RESECTION OF BLADDER TUMOR (TURBT);  Surgeon: Hollice Espy, MD;  Location: [facility] ORS;  Service: Urology;  Laterality: N/A;  . TRANSURETHRAL RESECTION OF PROSTATE N/A 10/18/2016   Procedure: TRANSURETHRAL RESECTION OF THE PROSTATE (TURP) CHANNEL TURP;  Surgeon: Hollice Espy, MD;  Location: [facility] ORS;  Service: Urology;  Laterality: N/A;    SOCIAL  HISTORY:   Social History   Tobacco Use  . Smoking status: Current Every Day Smoker    Packs/day: 0.50    Years: 55.00    Pack years: 27.50    Types: Cigarettes  . Smokeless tobacco: Never Used  Substance Use Topics  . Alcohol use: No    FAMILY HISTORY:   Family History  Problem Relation Age of Onset  . CVA Mother   . CAD Father     DRUG ALLERGIES:  No Known Allergies  REVIEW OF SYSTEMS:  CONSTITUTIONAL: No fever.  Positive for chills.  Positive for fatigue.  EYES: No blurred or double vision.  EARS, NOSE, AND THROAT: No tinnitus or ear pain. No sore throat RESPIRATORY: Positive for cough and shortness of breath.  No surgical wheezing or hemoptysis.  CARDIOVASCULAR: No chest pain, orthopnea, edema.  GASTROINTESTINAL: No nausea, vomiting, diarrhea or abdominal pain. No blood in bowel movements GENITOURINARY: No dysuria, hematuria.  ENDOCRINE: No polyuria, nocturia,  HEMATOLOGY: No anemia, easy bruising or bleeding SKIN: No rash or lesion. MUSCULOSKELETAL: No joint pain or arthritis.   NEUROLOGIC: Blackout the other day. PSYCHIATRY: No anxiety or depression.   MEDICATIONS AT HOME:   Prior to Admission medications   Medication Sig Start Date End Date Taking? Authorizing Provider  amiodarone (PACERONE) 200 MG tablet Take  1 tablet (200 mg total) by mouth 2 (two) times daily. 01/06/18  Yes Dustin Flock, MD  Fluticasone-Salmeterol (ADVAIR) 250-50 MCG/DOSE AEPB Inhale 1 puff into the lungs 2 (two) times daily.   Yes [provider]  metoprolol tartrate (LOPRESSOR) 100 MG tablet Take 1 tablet (100 mg total) by mouth 2 (two) times daily. 01/06/18  Yes Dustin Flock, MD  tamsulosin (FLOMAX) 0.4 MG CAPS capsule Take 1 capsule (0.4 mg total) by mouth daily. 05/29/16  Yes [name], MD  vitamin B-12 (CYANOCOBALAMIN) 1000 MCG tablet Take 1 tablet (1,000 mcg total) by mouth daily. 07/28/16  Yes Sindy Guadeloupe, MD  DULoxetine (CYMBALTA) 30 MG capsule  11/20/17    [provider]  finasteride (PROSCAR) 5 MG tablet Take 5 mg by mouth daily.    [provider]  ketoconazole (NIZORAL) 2 % cream Apply 1 application topically daily as needed.  04/03/17   [provider]  lidocaine-prilocaine (EMLA) cream Apply to affected area once Patient not taking: Reported on 01/14/2018 01/11/18   Sindy Guadeloupe, MD  pantoprazole (PROTONIX) 40 MG tablet Take 1 tablet (40 mg total) by mouth daily. Patient not taking: Reported on 02/13/2018 12/30/17   Loletha Grayer, MD      VITAL SIGNS:  Blood pressure (!) 185/69, pulse 68, temperature 98 F (36.7 C), temperature source Oral, resp. rate 18, height 5\' 10"  (5.784 m), weight 102.1 kg, SpO2 92 %.  PHYSICAL EXAMINATION:  GENERAL:  73 y.o.-year-old patient lying in the bed with no pursed lip shallow breathing. EYES: Pupils equal, round, reactive to light and accommodation. No scleral icterus. Extraocular muscles intact.  HEENT: Head atraumatic, normocephalic. Oropharynx and nasopharynx clear.  NECK:  Supple, no jugular venous distention. No thyroid enlargement, no tenderness.  LUNGS: Decreased breath sounds bilaterally, no wheezing, rales,rhonchi or crepitation. No use of accessory muscles of respiration.  CARDIOVASCULAR: S1, S2 normal. No murmurs, rubs, or gallops.  ABDOMEN: Soft, nontender, nondistended. Bowel sounds present. No organomegaly or mass.  EXTREMITIES: 2+ pedal edema, no cyanosis, or clubbing.  NEUROLOGIC: Cranial nerves II through XII are intact. Muscle strength 5/5 in all extremities. Sensation intact. Gait not checked.  PSYCHIATRIC: The patient is alert and oriented x 3.  SKIN: No rash, lesion, or ulcer.   LABORATORY PANEL:   CBC Recent Labs  Lab 02/07/18 0823  WBC 4.8  HGB 7.5*  HCT 22.1*  PLT 115*   ------------------------------------------------------------------------------------------------------------------  Chemistries  Recent Labs  Lab 02/07/18 0823  NA 140   K 4.0  CL 105  CO2 27  GLUCOSE 125*  BUN 14  CREATININE 0.88  CALCIUM 8.2*  AST 43*  ALT 28  ALKPHOS 391*  BILITOT 0.8   ------------------------------------------------------------------------------------------------------------------   RADIOLOGY:  Dg Chest Port 1 View  Result Date: 02/13/2018 CLINICAL DATA:  Hypoxia EXAM: PORTABLE CHEST 1 VIEW COMPARISON:  PET CT 12/11/2017 Chest radiograph 06/18/2017 FINDINGS: There is a right chest wall power injectable Port-A-Cath with tip at the cavoatrial junction via a right internal jugular vein approach. There is mild cardiomegaly and mild pulmonary edema. No focal airspace consolidation or pleural effusion. Numerous skeletal metastases are redemonstrated. IMPRESSION: Cardiomegaly with mild pulmonary edema. Extensive skeletal metastatic disease. Electronically Signed   By: Ulyses Jarred M.D.   On: 02/13/2018 14:54    EKG:   Ordered by me  IMPRESSION AND PLAN:   1.  Acute hypoxic respiratory failure.  Pulse ox 86% on room air.  Oxygen supplementation. 2.  COPD exacerbation.  Start Solu-Medrol  125 mg IV once and 40 mg IV twice daily.  Start nebulizer treatments budesonide and DuoNeb. 3.  Acute on chronic diastolic congestive heart failure give Lasix 40 mg IV once.  Lasix 20 mg IV twice daily 4.  Acute on chronic anemia.  Type and cross just in case transfusion needed.  Iron supplementation. 5.  Morbid obesity.  Weight loss needed 6.  Prostate cancer metastatic to bone.  Follow-up with oncology as outpatient 7.  History of atrial fibrillation on amiodarone and metoprolol 8.  BPH on Flomax and finasteride 9.  Depression on Cymbalta 10 tobacco abuse smoking cessation counseling done 4 minutes by me.  Nicotine patch ordered    All the records are reviewed and case discussed with ED provider. Management plans discussed with the patient, family and they are in agreement.  CODE STATUS: Full code  TOTAL TIME TAKING CARE OF THIS PATIENT:  50 minutes, including ACP time.    Loletha Grayer M.D on 02/13/2018 at 2:58 PM  Between 7am to 6pm - Pager - [phone]  After 6pm call admission pager [phone]  [facility]  [phone]  CC: Primary care physician; [name], MD

## 2018-02-13 NOTE — Progress Notes (Signed)
Noted pt. Hgb : 7.5 on 02/07/18. Called Dr. Delana Meyer and asked if he would like any labs today. MD declined. Pt. Pale, dyspneic. Pt. A&O x 3.

## 2018-02-13 NOTE — Progress Notes (Addendum)
Pt placed on auto CPAP.  Tolerating well.  HR 70 RR 18

## 2018-02-13 NOTE — Progress Notes (Signed)
Notified neighbor, Mia Creek [phone], by patient request that pt would be admitted. Butch Penny offered to give patient ride home when discharged, patient informed of this.

## 2018-02-13 NOTE — Plan of Care (Signed)
Pt admitted today from specials s/p portacath placement. Pt a&ox4. VSS. Denies pain. Hgb 6. Blood transfusion initiated.

## 2018-02-14 ENCOUNTER — Inpatient Hospital Stay: Payer: Medicare Other | Attending: Oncology

## 2018-02-14 ENCOUNTER — Inpatient Hospital Stay: Payer: Medicare Other

## 2018-02-14 ENCOUNTER — Encounter: Payer: Self-pay | Admitting: Vascular Surgery

## 2018-02-14 DIAGNOSIS — Z23 Encounter for immunization: Secondary | ICD-10-CM | POA: Diagnosis present

## 2018-02-14 DIAGNOSIS — C61 Malignant neoplasm of prostate: Secondary | ICD-10-CM | POA: Diagnosis not present

## 2018-02-14 LAB — CBC
HCT: 21.6 % — ABNORMAL LOW (ref 40.0–52.0)
Hemoglobin: 7.4 g/dL — ABNORMAL LOW (ref 13.0–18.0)
MCH: 31.3 pg (ref 26.0–34.0)
MCHC: 34.3 g/dL (ref 32.0–36.0)
MCV: 91.3 fL (ref 80.0–100.0)
PLATELETS: 70 10*3/uL — AB (ref 150–440)
RBC: 2.36 MIL/uL — AB (ref 4.40–5.90)
RDW: 18.8 % — ABNORMAL HIGH (ref 11.5–14.5)
WBC: 5.6 10*3/uL (ref 3.8–10.6)

## 2018-02-14 LAB — TYPE AND SCREEN
ABO/RH(D): A POS
Antibody Screen: NEGATIVE
UNIT DIVISION: 0

## 2018-02-14 LAB — BASIC METABOLIC PANEL
Anion gap: 9 (ref 5–15)
BUN: 15 mg/dL (ref 8–23)
CALCIUM: 8.1 mg/dL — AB (ref 8.9–10.3)
CO2: 30 mmol/L (ref 22–32)
CREATININE: 0.82 mg/dL (ref 0.61–1.24)
Chloride: 102 mmol/L (ref 98–111)
GFR calc non Af Amer: >60 mL/min (ref >60)
Glucose, Bld: 216 mg/dL — ABNORMAL HIGH (ref 70–99)
Potassium: 4 mmol/L (ref 3.5–5.1)
SODIUM: 141 mmol/L (ref 135–145)

## 2018-02-14 LAB — BPAM RBC
BLOOD PRODUCT EXPIRATION DATE: 201909282359
ISSUE DATE / TIME: 201909041855
UNIT TYPE AND RH: 6200

## 2018-02-14 MED ORDER — DULOXETINE HCL 30 MG PO CPEP: 30.0000 mg | ORAL_CAPSULE | Freq: Every day | ORAL | 3 refills | Status: DC

## 2018-02-14 MED ORDER — SODIUM CHLORIDE 0.9% FLUSH
10.0000 mL | INTRAVENOUS | Status: DC | PRN
  Administered 2018-02-14 (×2): 10 mL
  Filled 2018-02-14 (×2): qty 10

## 2018-02-14 MED ORDER — AMLODIPINE BESYLATE 10 MG PO TABS: 10.0000 mg | ORAL_TABLET | Freq: Every day | ORAL | 0 refills | Status: DC

## 2018-02-14 MED ORDER — PREDNISONE 10 MG (21) PO TBPK: ORAL_TABLET | ORAL | 0 refills | Status: DC

## 2018-02-14 MED ORDER — HEPARIN SOD (PORK) LOCK FLUSH 100 UNIT/ML IV SOLN
500.0000 [IU] | INTRAVENOUS | Status: AC | PRN
  Administered 2018-02-14: 500 [IU]
  Filled 2018-02-14: qty 5

## 2018-02-14 MED ORDER — AMLODIPINE BESYLATE 10 MG PO TABS
10.0000 mg | ORAL_TABLET | Freq: Every day | ORAL | Status: DC
  Administered 2018-02-14: 14:00:00 10 mg via ORAL
  Filled 2018-02-14: qty 2

## 2018-02-14 MED ORDER — INSULIN ASPART 100 UNIT/ML ~~LOC~~ SOLN: 0.0000 [IU] | Freq: Three times a day (TID) | SUBCUTANEOUS | Status: DC

## 2018-02-14 MED ORDER — INSULIN ASPART 100 UNIT/ML ~~LOC~~ SOLN: 0.0000 [IU] | Freq: Every day | SUBCUTANEOUS | Status: DC

## 2018-02-14 NOTE — Evaluation (Signed)
Physical Therapy Evaluation Patient Details Name: Michael Mathews MRN: [id number] DOB: November 15, 1945 Today's Date: 02/14/2018   History of Present Illness  72 y.o. male with history of anemia.  Patient had a elective port placement.  After the port placement he was found to be hypoxic.  Pt also with low blood levels, has had transfusion with increased HGB to 7.4.  Clinical Impression  Pt did relatively well with PT exam, though he needed O2 t/o the effort as his sats dropped to 80s quickly on room air with even minimal in-bed activity.  He was able to participate with gait trainging ~175 ft and stair training without AD and 3 liters O2 (~10 minutes apart from exam).  He reports he feels good about being able to go home and manage as needed, not interested in HHPT though as he is not at his baseline this is recommended.      Follow Up Recommendations Home health PT(Pt not interested in PT (possibly [facility]-type program?))    Equipment Recommendations  None recommended by PT    Recommendations for Other Services       Precautions / Restrictions Precautions Precautions: Fall Restrictions Weight Bearing Restrictions: No      Mobility  Bed Mobility Overal bed mobility: Independent             General bed mobility comments: Pt got to EOB confidently w/o assist  Transfers Overall transfer level: Modified independent Equipment used: None             General transfer comment: Pt showed good confidence and safety in getting to standing w/o assist  Ambulation/Gait Ambulation/Gait assistance: Supervision Gait Distance (Feet): 175 Feet Assistive device: Rolling walker (2 wheeled)       General Gait Details: Pt was able to walk with relatively consistent speed and good confidence.  No LOBs, on 2-3 liters his sats stayed in the mid/high 90s.    Stairs Stairs: Yes Stairs assistance: Supervision Stair Management: One rail Right Number of Stairs: 7 General stair comments: Pt  was able to negotiate up/down steps safely, though slow and cautious with the effort  Wheelchair Mobility    Modified Rankin (Stroke Patients Only)       Balance Overall balance assessment: Modified Independent                                           Pertinent Vitals/Pain Pain Assessment: No/denies pain    Home Living Family/patient expects to be discharged to:: Private residence Living Arrangements: Alone Available Help at Discharge: Available PRN/intermittently(minimal assist available) Type of Home: Mobile home Home Access: Stairs to enter Entrance Stairs-Rails: Right(trailer on other side to lean on) Entrance Stairs-Number of Steps: 7 Home Layout: One level Home Equipment: Environmental consultant - 2 wheels      Prior Function Level of Independence: Independent         Comments: Pt still driving, does grocery shopping, etc     Hand Dominance        Extremity/Trunk Assessment   Upper Extremity Assessment Upper Extremity Assessment: Overall WFL for tasks assessed    Lower Extremity Assessment Lower Extremity Assessment: Overall WFL for tasks assessed       Communication   Communication: No difficulties  Cognition Arousal/Alertness: Awake/alert Behavior During Therapy: WFL for tasks assessed/performed Overall Cognitive Status: Within Functional Limits for tasks assessed  General Comments      Exercises     Assessment/Plan    PT Assessment Patient needs continued PT services  PT Problem List Decreased range of motion;Decreased strength;Decreased activity tolerance;Decreased balance;Decreased mobility;Decreased coordination       PT Treatment Interventions Gait training;Stair training;Therapeutic activities;Therapeutic exercise;Balance training;Functional mobility training;Neuromuscular re-education;Patient/family education    PT Goals (Current goals can be found in the Care Plan  section)  Acute Rehab PT Goals Patient Stated Goal: go home PT Goal Formulation: With patient Time For Goal Achievement: 02/28/18 Potential to Achieve Goals: Good    Frequency Min 2X/week   Barriers to discharge        Co-evaluation               AM-PAC PT "6 Clicks" Daily Activity  Outcome Measure Difficulty turning over in bed (including adjusting bedclothes, sheets and blankets)?: None Difficulty moving from lying on back to sitting on the side of the bed? : None Difficulty sitting down on and standing up from a chair with arms (e.g., wheelchair, bedside commode, etc,.)?: None Help needed moving to and from a bed to chair (including a wheelchair)?: None Help needed walking in hospital room?: None Help needed climbing 3-5 steps with a railing? : None 6 Click Score: 24    End of Session Equipment Utilized During Treatment: Gait belt;Oxygen(attempted light in-bed acts on room air, O2 dropped to 80s) Activity Tolerance: Patient limited by fatigue Patient left: with chair alarm set;with call bell/phone within reach Nurse Communication: Mobility status PT Visit Diagnosis: Muscle weakness (generalized) (M62.81);Difficulty in walking, not elsewhere classified (R26.2)    Time: 5374-8270 PT Time Calculation (min) (ACUTE ONLY): 23 min   Charges:   PT Evaluation $PT Eval Low Complexity: 1 Low PT Treatments $Gait Training: 8-22 mins        Kreg Shropshire, DPT 02/14/2018, 11:03 AM

## 2018-02-14 NOTE — Progress Notes (Signed)
Pt is being discharged home.  Discharge papers given and explained to pt.  Pt verbalized understanding.  Meds and f/u appointments reviewed.  Rx given.  Awaiting transportation. 

## 2018-02-14 NOTE — Progress Notes (Signed)
Pt on 2.5 L O2 with O2 sats in the high 90's. Per PT progress note pt's O2 sats dropped to the 80's on RA with ambulation. Pt uses O2 at home at night. Per pt he has O2 concentrator at home, but no O2 tank. Notified Dr Anselm Jungling and Merrily Pew, case manager of the above. Pt refuses O2 tank. No further orders, will discharge pt.

## 2018-02-14 NOTE — Care Management Note (Signed)
Case Management Note  Patient Details  Name: AEON KESSNER MRN: [id number] Date of Birth: [birth year]  Subjective/Objective:  Patient from home alone. Admitted on 9/4 for COPD exacerbation and symptomatic anemia post port a cath placement. Patient received blood transfusion and most recent hemoglobin was 7.4. Started on IV steroids, nebulizer treatments, and lasix for COPD. Patient has since ambulated and Nursing is attempting to wean patient off of oxygen. Chronic O2 at night currently. . PT has recommended Home with home health. Patient currently refusing since he is independent at baseline and believes his independence will return once his overall condition improves. Patient has a walker in the home and has no further DME needs.                     Action/Plan: RNCM to continue to follow for any needs.   Expected Discharge Date:  02/13/18               Expected Discharge Plan:     In-House Referral:     Discharge planning Services  CM Consult  Post Acute Care Choice:    Choice offered to:     DME Arranged:    DME Agency:     HH Arranged:  Patient Refused [place] Agency:     Status of Service:  Completed, signed off  If discussed at [employer] of Stay Meetings, dates discussed:    Additional Comments:  Latanya Maudlin, RN 02/14/2018, 8:49 AM

## 2018-02-15 ENCOUNTER — Ambulatory Visit: Payer: Medicare Other

## 2018-02-15 ENCOUNTER — Other Ambulatory Visit: Payer: Medicare Other

## 2018-02-17 NOTE — Discharge Summary (Signed)
[facility] [place] NAME: Michael Mathews    MR#:  [id number]  DATE OF BIRTH:  1946/05/21  DATE OF ADMISSION:  02/13/2018 ADMITTING PHYSICIAN: Loletha Grayer, MD  DATE OF DISCHARGE: 02/14/2018  3:10 PM  PRIMARY CARE PHYSICIAN: [name], MD    ADMISSION DIAGNOSIS:  Porta Cath Placement    Prostate Cancer  DISCHARGE DIAGNOSIS:  Active Problems:   COPD exacerbation ([place])   SECONDARY DIAGNOSIS:   Past Medical History:  Diagnosis Date  . Anemia   . Arthritis   . Cancer [facility])    prostate  . COPD (chronic obstructive pulmonary disease) ([place])   . Coronary artery disease   . Cough    chronic  . Diabetes mellitus without complication ([place])   . Edema   . Elevated PSA   . Headache   . Hypertension   . Orthopnea   . Oxygen deficit    o2 prn  . Prostate cancer ([place])   . Shortness of breath dyspnea   . Sleep apnea    cpap    HOSPITAL COURSE:   1.  Acute hypoxic respiratory failure.  Pulse ox 86% on room air.  Oxygen supplementation.  pt had drop in sats on ambulation, offered to arrange for oxygen at home, but he had concentrator to use at night,a nd he refused for any further arrangements. 2.  COPD exacerbation.  Start Solu-Medrol 125 mg IV once and 40 mg IV twice daily.  Start nebulizer treatments budesonide and DuoNeb. 3.  Acute on chronic diastolic congestive heart failure give Lasix 40 mg IV once.  Lasix 20 mg IV twice daily  improved. 4.  Acute on chronic anemia.  Type and cross just in case transfusion needed.  Iron supplementation.  hb stable. Follows with hematology. 5.  Morbid obesity.  Weight loss needed 6.  Prostate cancer metastatic to bone.  Follow-up with oncology as outpatient 7.  History of atrial fibrillation on amiodarone and metoprolol 8.  BPH on Flomax and finasteride 9.  Depression on Cymbalta 10 tobacco abuse smoking cessation counseling done 4 minutes by me.  Nicotine patch ordered  DISCHARGE  CONDITIONS:   Stable.  CONSULTS OBTAINED:  Treatment Team:  Loletha Grayer, MD  DRUG ALLERGIES:  No Known Allergies  DISCHARGE MEDICATIONS:   Allergies as of 02/14/2018   No Known Allergies     Medication List    TAKE these medications   amiodarone 200 MG tablet Commonly known as:  PACERONE Take 1 tablet (200 mg total) by mouth 2 (two) times daily.   amLODipine 10 MG tablet Commonly known as:  NORVASC Take 1 tablet (10 mg total) by mouth daily.   DULoxetine 30 MG capsule Commonly known as:  CYMBALTA Take 1 capsule (30 mg total) by mouth daily. What changed:    how much to take  how to take this  when to take this   finasteride 5 MG tablet Commonly known as:  PROSCAR Take 5 mg by mouth daily.   Fluticasone-Salmeterol 250-50 MCG/DOSE Aepb Commonly known as:  ADVAIR Inhale 1 puff into the lungs 2 (two) times daily.   ketoconazole 2 % cream Commonly known as:  NIZORAL Apply 1 application topically daily as needed.   lidocaine-prilocaine cream Commonly known as:  EMLA Apply to affected area once   metoprolol tartrate 100 MG tablet Commonly known as:  LOPRESSOR Take 1 tablet (100 mg total) by mouth 2 (two) times daily.   pantoprazole  40 MG tablet Commonly known as:  PROTONIX Take 1 tablet (40 mg total) by mouth daily.   predniSONE 10 MG (21) Tbpk tablet Commonly known as:  STERAPRED UNI-PAK 21 TAB Take 6 tabs first day, 5 tab on day 2, then 4 on day 3rd, 3 tabs on day 4th , 2 tab on day 5th, and 1 tab on 6th day.   tamsulosin 0.4 MG Caps capsule Commonly known as:  FLOMAX Take 1 capsule (0.4 mg total) by mouth daily.   vitamin B-12 1000 MCG tablet Commonly known as:  CYANOCOBALAMIN Take 1 tablet (1,000 mcg total) by mouth daily.        DISCHARGE INSTRUCTIONS:    Follow with PMD in 1-2 weeks.  If you experience worsening of your admission symptoms, develop shortness of breath, life threatening emergency, suicidal or homicidal thoughts you  must seek medical attention immediately by calling 911 or calling your MD immediately  if symptoms less severe.  You Must read complete instructions/literature along with all the possible adverse reactions/side effects for all the Medicines you take and that have been prescribed to you. Take any new Medicines after you have completely understood and accept all the possible adverse reactions/side effects.   Please note  You were cared for by a hospitalist during your hospital stay. If you have any questions about your discharge medications or the care you received while you were in the hospital after you are discharged, you can call the unit and asked to speak with the hospitalist on call if the hospitalist that took care of you is not available. Once you are discharged, your primary care physician will handle any further medical issues. Please note that NO REFILLS for any discharge medications will be authorized once you are discharged, as it is imperative that you return to your primary care physician (or establish a relationship with a primary care physician if you do not have one) for your aftercare needs so that they can reassess your need for medications and monitor your lab values.   Today   CHIEF COMPLAINT:  No chief complaint on file.   HISTORY OF PRESENT ILLNESS:  Michael Mathews  is a 72 y.o. male with a known history of anemia.  Patient had a elective port placement.  After the port placement he was found to be hypoxic with a pulse ox in the 80s.  The patient does have some shortness of breath and some cough.  He has a history of COPD.  Patient is still a current smoker of 1/2 pack/day.  VITAL SIGNS:  Blood pressure (!) 170/75, pulse (!) 54, temperature (!) 97.4 F (36.3 C), temperature source Oral, resp. rate 16, height 5\' 10"  (1.778 m), weight 103.1 kg, SpO2 99 %.  I/O:  No intake or output data in the 24 hours ending 02/17/18 1343  PHYSICAL EXAMINATION:  GENERAL:  72  y.o.-year-old patient lying in the bed with no acute distress.  EYES: Pupils equal, round, reactive to light and accommodation. No scleral icterus. Extraocular muscles intact.  HEENT: Head atraumatic, normocephalic. Oropharynx and nasopharynx clear.  NECK:  Supple, no jugular venous distention. No thyroid enlargement, no tenderness.  LUNGS: Normal breath sounds bilaterally, no wheezing, rales,rhonchi or crepitation. No use of accessory muscles of respiration.  CARDIOVASCULAR: S1, S2 normal. No murmurs, rubs, or gallops.  ABDOMEN: Soft, non-tender, non-distended. Bowel sounds present. No organomegaly or mass.  EXTREMITIES: No pedal edema, cyanosis, or clubbing.  NEUROLOGIC: Cranial nerves II through XII are intact. Muscle  strength 5/5 in all extremities. Sensation intact. Gait not checked.  PSYCHIATRIC: The patient is alert and oriented x 3.  SKIN: No obvious rash, lesion, or ulcer.   DATA REVIEW:   CBC Recent Labs  Lab 02/14/18 0357  WBC 5.6  HGB 7.4*  HCT 21.6*  PLT 70*    Chemistries  Recent Labs  Lab 02/14/18 0357  NA 141  K 4.0  CL 102  CO2 30  GLUCOSE 216*  BUN 15  CREATININE 0.82  CALCIUM 8.1*    Cardiac Enzymes No results for input(s): TROPONINI in the last 168 hours.  Microbiology Results  Results for orders placed or performed in visit on 01/14/18  Urine Culture     Status: None   Collection Time: 01/14/18  3:24 PM  Result Value Ref Range Status   Specimen Description   Final    URINE, CLEAN CATCH Performed at [facility], [address] [place], [place] [address]    Special Requests   Final    NONE Performed at [facility], [address] [place], [place] [address]    Culture   Final    NO GROWTH Performed at [facility], [place] [address] [place],  [address]    Report Status 01/15/2018 FINAL  Final    RADIOLOGY:  No results found.  EKG:   Orders placed or performed during the hospital encounter of 02/13/18  .  EKG 12-Lead  . EKG 12-Lead      Management plans discussed with the patient, family and they are in agreement.  CODE STATUS:  DNR Code Status History    Date Active Date Inactive Code Status Order ID Comments User Context   02/13/2018 1454 02/14/2018 1816 DNR [id number]  Loletha Grayer, MD Inpatient   01/04/2018 1352 01/06/2018 1449 Full Code [id number]  [name], [place]   12/28/2017 1740 12/30/2017 1813 Full Code [id number]  Loletha Grayer, MD Inpatient   01/19/2017 0615 01/19/2017 1954 Full Code [id number]  Harrie Foreman, MD ED   10/18/2016 1340 10/19/2016 1648 Full Code [id number]  Hollice Espy, MD Inpatient   03/23/2016 2017 03/24/2016 1501 DNR [id number]  Loletha Grayer, MD ED   03/11/2016 1341 03/14/2016 1606 DNR [id number]  Bettey Costa, MD ED   03/03/2016 1536 03/04/2016 0706 DNR [id number]  Bettey Costa, MD Inpatient    Questions for Most Recent Historical Code Status (Order [id number])    Question Answer Comment   In the event of cardiac or respiratory ARREST Do not call a "code blue"    In the event of cardiac or respiratory ARREST Do not perform Intubation, CPR, defibrillation or ACLS    In the event of cardiac or respiratory ARREST Use medication by any route, position, wound care, and other measures to relive pain and suffering. May use oxygen, suction and manual treatment of airway obstruction as needed for comfort.    Comments nurse may pronounce         Advance Directive Documentation     Most Recent Value  Type of Advance Directive  Living will  Pre-existing out of facility DNR order (yellow form or pink MOST form)  -  "MOST" Form in Place?  -      TOTAL TIME TAKING CARE OF THIS PATIENT: 35 minutes.    [name] M.D on 02/17/2018 at 1:43 PM  Between 7am to 6pm - Pager - [phone]  After 6pm go to [email or web address] - password [email or web address]  [employer]  Office  [phone]  CC: Primary care physician; [name],  MD   Note: This dictation was prepared with Dragon dictation along with smaller phrase technology. Any transcriptional errors that result from this process are unintentional.

## 2018-02-22 ENCOUNTER — Ambulatory Visit: Payer: Medicare Other | Admitting: Oncology

## 2018-02-22 ENCOUNTER — Inpatient Hospital Stay (HOSPITAL_BASED_OUTPATIENT_CLINIC_OR_DEPARTMENT_OTHER): Payer: Medicare Other | Admitting: Oncology

## 2018-02-22 ENCOUNTER — Other Ambulatory Visit: Payer: Self-pay | Admitting: Oncology

## 2018-02-22 ENCOUNTER — Encounter: Payer: Self-pay | Admitting: Oncology

## 2018-02-22 ENCOUNTER — Inpatient Hospital Stay: Payer: Medicare Other

## 2018-02-22 ENCOUNTER — Inpatient Hospital Stay: Payer: Medicare Other | Attending: Oncology

## 2018-02-22 VITALS — BP 132/84 | HR 99 | Temp 96.3°F | Resp 20

## 2018-02-22 VITALS — BP 133/79 | HR 112 | Temp 97.9°F | Resp 18 | Ht 70.0 in | Wt 221.2 lb

## 2018-02-22 DIAGNOSIS — I10 Essential (primary) hypertension: Secondary | ICD-10-CM | POA: Diagnosis not present

## 2018-02-22 DIAGNOSIS — C792 Secondary malignant neoplasm of skin: Secondary | ICD-10-CM | POA: Diagnosis not present

## 2018-02-22 DIAGNOSIS — I2510 Atherosclerotic heart disease of native coronary artery without angina pectoris: Secondary | ICD-10-CM | POA: Diagnosis not present

## 2018-02-22 DIAGNOSIS — C7951 Secondary malignant neoplasm of bone: Secondary | ICD-10-CM | POA: Diagnosis not present

## 2018-02-22 DIAGNOSIS — D61818 Other pancytopenia: Secondary | ICD-10-CM | POA: Diagnosis not present

## 2018-02-22 DIAGNOSIS — C779 Secondary and unspecified malignant neoplasm of lymph node, unspecified: Secondary | ICD-10-CM | POA: Insufficient documentation

## 2018-02-22 DIAGNOSIS — C61 Malignant neoplasm of prostate: Secondary | ICD-10-CM

## 2018-02-22 DIAGNOSIS — Z79899 Other long term (current) drug therapy: Secondary | ICD-10-CM | POA: Insufficient documentation

## 2018-02-22 DIAGNOSIS — M1990 Unspecified osteoarthritis, unspecified site: Secondary | ICD-10-CM | POA: Diagnosis not present

## 2018-02-22 DIAGNOSIS — F17210 Nicotine dependence, cigarettes, uncomplicated: Secondary | ICD-10-CM | POA: Insufficient documentation

## 2018-02-22 DIAGNOSIS — C7A1 Malignant poorly differentiated neuroendocrine tumors: Secondary | ICD-10-CM | POA: Diagnosis not present

## 2018-02-22 DIAGNOSIS — C787 Secondary malignant neoplasm of liver and intrahepatic bile duct: Secondary | ICD-10-CM | POA: Insufficient documentation

## 2018-02-22 DIAGNOSIS — R740 Nonspecific elevation of levels of transaminase and lactic acid dehydrogenase [LDH]: Secondary | ICD-10-CM | POA: Insufficient documentation

## 2018-02-22 DIAGNOSIS — D649 Anemia, unspecified: Secondary | ICD-10-CM

## 2018-02-22 DIAGNOSIS — E8809 Other disorders of plasma-protein metabolism, not elsewhere classified: Secondary | ICD-10-CM | POA: Diagnosis not present

## 2018-02-22 DIAGNOSIS — J449 Chronic obstructive pulmonary disease, unspecified: Secondary | ICD-10-CM | POA: Insufficient documentation

## 2018-02-22 DIAGNOSIS — Z5112 Encounter for antineoplastic immunotherapy: Secondary | ICD-10-CM | POA: Insufficient documentation

## 2018-02-22 DIAGNOSIS — N400 Enlarged prostate without lower urinary tract symptoms: Secondary | ICD-10-CM | POA: Insufficient documentation

## 2018-02-22 DIAGNOSIS — E119 Type 2 diabetes mellitus without complications: Secondary | ICD-10-CM | POA: Insufficient documentation

## 2018-02-22 DIAGNOSIS — I4892 Unspecified atrial flutter: Secondary | ICD-10-CM | POA: Diagnosis not present

## 2018-02-22 LAB — CBC WITH DIFFERENTIAL/PLATELET
BASOS PCT: 0 %
Basophils Absolute: 0 10*3/uL (ref 0–0.1)
EOS ABS: 0 10*3/uL (ref 0–0.7)
EOS PCT: 0 %
HEMATOCRIT: 18.6 % — AB (ref 40.0–52.0)
Hemoglobin: 6.3 g/dL — ABNORMAL LOW (ref 13.0–18.0)
Lymphocytes Relative: 8 %
Lymphs Abs: 0.6 10*3/uL — ABNORMAL LOW (ref 1.0–3.6)
MCH: 31.5 pg (ref 26.0–34.0)
MCHC: 33.9 g/dL (ref 32.0–36.0)
MCV: 93 fL (ref 80.0–100.0)
MONO ABS: 0.7 10*3/uL (ref 0.2–1.0)
MONOS PCT: 10 %
NEUTROS ABS: 5.7 10*3/uL (ref 1.4–6.5)
Neutrophils Relative %: 82 %
PLATELETS: 75 10*3/uL — AB (ref 150–440)
RBC: 2 MIL/uL — ABNORMAL LOW (ref 4.40–5.90)
RDW: 17.9 % — AB (ref 11.5–14.5)
WBC: 7 10*3/uL (ref 3.8–10.6)

## 2018-02-22 LAB — COMPREHENSIVE METABOLIC PANEL
ALK PHOS: 451 U/L — AB (ref 38–126)
ALT: 33 U/L (ref 0–44)
AST: 45 U/L — ABNORMAL HIGH (ref 15–41)
Albumin: 2.6 g/dL — ABNORMAL LOW (ref 3.5–5.0)
Anion gap: 8 (ref 5–15)
BILIRUBIN TOTAL: 0.6 mg/dL (ref 0.3–1.2)
BUN: 18 mg/dL (ref 8–23)
CALCIUM: 7.9 mg/dL — AB (ref 8.9–10.3)
CO2: 29 mmol/L (ref 22–32)
CREATININE: 0.81 mg/dL (ref 0.61–1.24)
Chloride: 101 mmol/L (ref 98–111)
GFR calc Af Amer: >60 mL/min (ref >60)
GFR calc non Af Amer: >60 mL/min (ref >60)
GLUCOSE: 100 mg/dL — AB (ref 70–99)
Potassium: 3.6 mmol/L (ref 3.5–5.1)
Sodium: 138 mmol/L (ref 135–145)
TOTAL PROTEIN: 6.2 g/dL — AB (ref 6.5–8.1)

## 2018-02-22 LAB — SAMPLE TO BLOOD BANK

## 2018-02-22 LAB — PREPARE RBC (CROSSMATCH)

## 2018-02-22 MED ORDER — ACETAMINOPHEN 325 MG PO TABS
650.0000 mg | ORAL_TABLET | Freq: Once | ORAL | Status: AC
  Administered 2018-02-22: 650 mg via ORAL
  Filled 2018-02-22: qty 2

## 2018-02-22 MED ORDER — SODIUM CHLORIDE 0.9 % IV SOLN
Freq: Once | INTRAVENOUS | Status: AC
  Administered 2018-02-22: 10:00:00 via INTRAVENOUS
  Filled 2018-02-22: qty 250

## 2018-02-22 MED ORDER — HEPARIN SOD (PORK) LOCK FLUSH 100 UNIT/ML IV SOLN
500.0000 [IU] | Freq: Once | INTRAVENOUS | Status: AC
  Administered 2018-02-22: 500 [IU] via INTRAVENOUS
  Filled 2018-02-22: qty 5

## 2018-02-22 MED ORDER — SODIUM CHLORIDE 0.9% FLUSH
10.0000 mL | INTRAVENOUS | Status: DC | PRN
  Administered 2018-02-22 (×2): 10 mL via INTRAVENOUS
  Filled 2018-02-22: qty 10

## 2018-02-22 MED ORDER — LEUPROLIDE ACETATE (3 MONTH) 22.5 MG IM KIT
22.5000 mg | PACK | INTRAMUSCULAR | Status: DC
  Administered 2018-02-22: 22.5 mg via INTRAMUSCULAR
  Filled 2018-02-22: qty 22.5

## 2018-02-22 MED ORDER — FUROSEMIDE 10 MG/ML IJ SOLN
20.0000 mg | Freq: Once | INTRAMUSCULAR | Status: AC
  Administered 2018-02-22: 20 mg via INTRAVENOUS
  Filled 2018-02-22: qty 2

## 2018-02-22 MED ORDER — SODIUM CHLORIDE 0.9 % IV SOLN
240.0000 mg | Freq: Once | INTRAVENOUS | Status: AC
  Administered 2018-02-22: 240 mg via INTRAVENOUS
  Filled 2018-02-22: qty 24

## 2018-02-22 MED ORDER — SODIUM CHLORIDE 0.9% IV SOLUTION
250.0000 mL | Freq: Once | INTRAVENOUS | Status: AC
  Administered 2018-02-22: 250 mL via INTRAVENOUS
  Filled 2018-02-22: qty 250

## 2018-02-22 NOTE — Progress Notes (Signed)
No new changes noted today 

## 2018-02-22 NOTE — Progress Notes (Signed)
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 Mets to bone , liver, LN and skin   Chief complaint/ Reason for visit-on treatment assessment prior to cycle 2 of Opdivo  Heme/Onc history: 1. patient is 72 year old male with a past medical history significant for hypertension diabetes and COPD among other medical problems. He was admitted to the hospital in October 2017 with some symptoms of dizziness and abdominal pain. CT angiogram abdomen incidentally showed sclerotic metastatic disease throughout the visualized spine.   2. This was followed by an MRI of the lumbar spine which showed multifocal T1 and T2 hypointense lesions with areas of increased signal on STIR sequence are seen in multiple vertebral bodies. Largest lesions are in T11 T12 L1 and in the imaged sacrum. This is consistent with metastatic disease. Primary lesion is not identified. MRI brain showed no acute intracranial abnormality.   3. Patient was noted to have an elevated PSA and was referred to urology as an outpatient. He was recently seen by Dr. Tresa Moore on 05/29/2016. PSA was repeated at that time which came back elevated at 10.3. 3 months over the value was 10.81.  4. Bone scan on 07/07/16 showed : Widespread radiotracer uptake over the axial and appendicular skeleton as described compatible with metastatic disease and correlating with sclerotic lesions on Ct. CT chest on 07/07/16 showed: Scattered sclerotic osseous metastatic lesions throughout spine, pelvis, proximal femora, ribs, and manubrium.  Prostatic enlargement with thickened bladder wall question related to chronic bladder outlet obstruction.  Spiculated 12 x 11 x 7 mm LEFT upper lobe nodule question primary pulmonary neoplasm. Stable nonspecific small LEFT adrenal nodule. Single minimally enlarged AP window lymph node. BILATERAL inguinal hernias containing fat. 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 Recommend annual imaging followup by CTA or MRA.   5. Anemia work up from 06/20/16 was as follows: CBC showed white count of 5.2, H&H of 8.1/25 and a platelet count of 164. CMP showed elevated alkaline phosphatase of 391. Multiple myeloma panel showed normal quantitative immunoglobulins and no monoclonal M protein.Vitamin B12 level was low normal at 225. LDH was mildly elevated at 234. Ferritin was low normal at 27. Reticulocyte count was 2.4% inappropriately low for the degree of anemia. Haptoglobin was elevated at 273.  6. Patients case was discussed at tumor board and plan was to watch the lung nodule with scans as it was in a difficult area to biopsy. CT guided bone biopsy was obtained which showed metastatic prostate cancer for which he is on lupron  7. Given that he does not have visceral or lymph node metastasis, docetaxel/zytigawas not started. Also patient lives alone and does not have a good social support or means of communication. Those options to be consideredat progression.   8. Bone scan on 12/22/2016 showed significantly worsening osseous metastatic disease. Left upper lobe nodule was slightly smaller in size. Zytiga was added in setting of worsening bone disease along with prednisonein aug 2018. Lupron administered 02/13/2017.  9. Scans after 3 months of zytiga showed progression of bone mets. PSA remains under control. Case discussed at tumor  board and plan was to hold zytiga and proceed with [facility] for 6  months followed by repeat scans. xofigo started in jan 2019. Patientreceived 6 doses of xofigo. Last dose on 11/21/17  10. He was then found to have 2 new skin lesions- one showed SCC. 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 PET/CT scan on 12/11/17 showed:1. Examination is positive for extensive FDG avid, hypermetabolic liver metastases. 2. Widespread hypermetabolic sclerotic bone metastases. 3. Increased uptake within soft tissue in the prostate bed compatible with residual/recurrent hypermetabolic local tumor. 4. Hypermetabolic left-sided mediastinal and right hilar lymph nodes. Suspicious for nodal metastasis. 5. Aortic atherosclerosis and coronary artery atherosclerotic calcifications. Aortic Atherosclerosis (ICD10-I70.0).  12. Path shows high grade neuroendocrine carcinoma positive for PSA suggestive of prostate primary   Interval history-patient was supposed to get cycle 2 of Opdivo on 02/07/2018.  However he did not have good venous access and we had to get a port placed.  Communication has always been challenging with this patient because he does not have a cell phone.  He was then hospitalized for shortness of breath and his treatment without further delay.  Today he reports that he is at his baseline.  He lives in a trailer and is independent of his ADLs.  Leg swelling waxes and wanes but today it is better.  He also met with hospice and did not wish to enroll in hospice at this time.  He denies any pain presently  ECOG PS- 2 Pain scale- 0 Opioid associated constipation- no  Review of systems- Review of Systems  Constitutional: Positive for malaise/fatigue. Negative for chills, fever and weight loss.  HENT: Negative for congestion, ear discharge and nosebleeds.   Eyes: Negative for blurred vision.  Respiratory: Positive for shortness of breath. Negative for cough, hemoptysis, sputum production and wheezing.     Cardiovascular: Positive for leg swelling. Negative for chest pain, palpitations, orthopnea and claudication.  Gastrointestinal: Negative for abdominal pain, blood in stool, constipation, diarrhea, heartburn, melena, nausea and vomiting.  Genitourinary: Negative for dysuria, flank pain, frequency, hematuria and urgency.  Musculoskeletal: Negative for back pain, joint pain and myalgias.  Skin: Negative for rash.  Neurological: Negative for dizziness, tingling, focal weakness, seizures, weakness and headaches.  Endo/Heme/Allergies: Does not bruise/bleed easily.  Psychiatric/Behavioral: Negative for depression and suicidal ideas. The patient does not have insomnia.       No Known Allergies   Past Medical History:  Diagnosis Date  . Anemia   . Arthritis   . Cancer [facility])    prostate  . COPD (chronic obstructive pulmonary disease) ([place])   . Coronary artery disease   . Cough    chronic  . Diabetes mellitus without complication ([place])   . Edema   . Elevated PSA   . Headache   . Hypertension   . Orthopnea   . Oxygen deficit    o2 prn  . Prostate cancer ([place])   . Shortness of breath dyspnea   . Sleep apnea    cpap     Past Surgical History:  Procedure Laterality Date  . CARDIAC CATHETERIZATION  2014  . COLONOSCOPY    . CYSTOSCOPY W/ RETROGRADES Bilateral 10/18/2016   Procedure: CYSTOSCOPY WITH RETROGRADE PYELOGRAM;  Surgeon: Hollice Espy, MD;  Location: [facility] ORS;  Service: Urology;  Laterality: Bilateral;  . EYE SURGERY    . KNEE ARTHROSCOPY    . PORTA CATH INSERTION N/A 02/13/2018   Procedure: PORTA CATH INSERTION;  Surgeon: Katha Cabal, MD;  Location: [place] [facility];  Service: Cardiovascular;  Laterality: N/A;  . TRANSURETHRAL RESECTION OF BLADDER TUMOR N/A 10/18/2016   Procedure: TRANSURETHRAL RESECTION OF BLADDER TUMOR (TURBT);  Surgeon: Hollice Espy, MD;  Location: [facility] ORS;  Service: Urology;  Laterality: N/A;  . TRANSURETHRAL RESECTION OF PROSTATE N/A  10/18/2016   Procedure: TRANSURETHRAL RESECTION OF THE PROSTATE (TURP) CHANNEL TURP;  Surgeon: Hollice Espy, MD;  Location: [facility] ORS;  Service: Urology;  Laterality: N/A;    Social History   Socioeconomic History  . Marital status: Single    Spouse name: Not on file  . Number of children: Not on file  . Years of education: Not on file  . Highest education level: Not on file  Occupational History  . Not on file  Social Needs  . Financial resource strain: Not on file  . Food insecurity:    Worry: Not on file    Inability: Not on file  . Transportation needs:    Medical: Not on file    Non-medical: Not on file  Tobacco Use  . Smoking status: Current Every Day Smoker    Packs/day: 0.50    Years: 55.00    Pack years: 27.50    Types: Cigarettes  . Smokeless tobacco: Never Used  Substance and Sexual Activity  . Alcohol use: No  . Drug use: No  . Sexual activity: Not Currently  Lifestyle  . Physical activity:    Days per week: Not on file    Minutes per session: Not on file  . Stress: Not on file  Relationships  . Social connections:    Talks on phone: Not on file    Gets together: Not on file    Attends religious service: Not on file    Active member of club or organization: Not on file    Attends meetings of clubs or organizations: Not on file    Relationship status: Not on file  . Intimate partner violence:    Fear of current or ex partner: Not on file    Emotionally abused: Not on file    Physically abused: Not on file    Forced sexual activity: Not on file  Other Topics Concern  . Not on file  Social History Narrative  . Not on file    Family History  Problem Relation Age of Onset  . CVA Mother   . CAD Father      Current Outpatient Medications:  .  amiodarone (PACERONE) 200 MG tablet, Take 1 tablet (200 mg total) by mouth 2 (two) times daily., Disp: 60 tablet, Rfl: 0 .  amLODipine (NORVASC) 10 MG tablet, Take 1 tablet (10 mg total) by mouth daily.,  Disp: 30 tablet, Rfl: 0 .  DULoxetine (CYMBALTA) 30 MG capsule, Take 1 capsule (30 mg total) by mouth daily., Disp: 30 capsule, Rfl: 3 .  finasteride (PROSCAR) 5 MG tablet, Take 5 mg by mouth daily., Disp: , Rfl:  .  Fluticasone-Salmeterol (ADVAIR) 250-50 MCG/DOSE AEPB, Inhale 1 puff into the lungs 2 (two) times daily., Disp: , Rfl:  .  ketoconazole (NIZORAL) 2 % cream, Apply 1 application topically daily as needed. , Disp: , Rfl:  .  lidocaine-prilocaine (EMLA) cream, Apply to affected area once (Patient not taking: Reported on 01/14/2018), Disp: 30 g, Rfl: 3 .  metoprolol tartrate (LOPRESSOR) 100 MG tablet, Take 1 tablet (100 mg total) by mouth 2 (two) times daily., Disp: 60 tablet, Rfl: 0 .  pantoprazole (PROTONIX) 40 MG tablet, Take 1 tablet (40 mg total) by mouth daily. (Patient not taking: Reported on 02/13/2018), Disp: 30 tablet, Rfl: 0 .  predniSONE (STERAPRED UNI-PAK 21 TAB) 10 MG (21) TBPK tablet, Take 6 tabs first day, 5 tab on day 2, then 4 on day 3rd, 3 tabs on day 4th , 2 tab on day 5th, and 1 tab on 6th day., Disp: 21 tablet, Rfl: 0 .  tamsulosin (FLOMAX) 0.4 MG CAPS capsule, Take 1 capsule (0.4 mg total) by mouth daily., Disp: 90 capsule, Rfl: 3 .  vitamin B-12 (CYANOCOBALAMIN) 1000 MCG tablet, Take 1 tablet (1,000 mcg total) by mouth daily., Disp: 30 tablet, Rfl: 3 No current facility-administered medications for this visit.   Facility-Administered Medications Ordered in Other Visits:  .  heparin lock flush 100 unit/mL, 500 Units, Intravenous, Once, Sindy Guadeloupe, MD .  leuprolide (LUPRON) injection 22.5 mg, 22.5 mg, Intramuscular, Q90 days, Randa Evens C, MD .  sodium chloride flush (NS) 0.9 % injection 10 mL, 10 mL, Intravenous, PRN, Sindy Guadeloupe, MD, 10 mL at 02/22/18 0814  Physical exam:  Vitals:   02/22/18 0850  BP: 133/79  Pulse: (!) 112  Resp: 18  Temp: 97.9 F (36.6 C)  TempSrc: Tympanic  Weight: 221 lb 3.2 oz (100.3 kg)  Height: 5' 10"  (1.778 m)   Physical Exam   Constitutional: He is oriented to person, place, and time.  Elderly fatigued gentleman sitting in a wheelchair.  Appears in no acute distress  HENT:  Head: Normocephalic and atraumatic.  Eyes: Pupils are equal, round, and reactive to light. EOM are normal.  Neck: Normal range of motion.  Cardiovascular: Regular rhythm and normal heart sounds.  Tachycardic  Pulmonary/Chest: Effort normal and breath sounds normal.  Abdominal: Soft. Bowel sounds are normal.  Musculoskeletal: He exhibits edema (Bilateral +1).  Neurological: He is alert and oriented to person, place, and time.  Skin: Skin is warm and dry.     CMP Latest Ref Rng & Units 02/22/2018  Glucose 70 - 99 mg/dL 100(H)  BUN 8 - 23 mg/dL 18  Creatinine 0.61 - 1.24 mg/dL 0.81  Sodium 135 - 145 mmol/L 138  Potassium 3.5 - 5.1 mmol/L 3.6  Chloride 98 - 111 mmol/L 101  CO2 22 - 32 mmol/L 29  Calcium 8.9 - 10.3 mg/dL 7.9(L)  Total Protein 6.5 - 8.1 g/dL 6.2(L)  Total Bilirubin 0.3 - 1.2 mg/dL 0.6  Alkaline Phos 38 - 126 U/L 451(H)  AST 15 - 41 U/L 45(H)  ALT 0 - 44 U/L 33   CBC Latest Ref Rng & Units 02/22/2018  WBC 3.8 - 10.6 K/uL 7.0  Hemoglobin 13.0 - 18.0 g/dL 6.3(L)  Hematocrit 40.0 - 52.0 % 18.6(L)  Platelets 150 - 440 K/uL 75(L)    No images are attached to the encounter.  Dg Chest Port 1 View  Result Date: 02/13/2018 CLINICAL DATA:  Hypoxia EXAM: PORTABLE CHEST 1 VIEW COMPARISON:  PET CT 12/11/2017 Chest radiograph 06/18/2017 FINDINGS: There is a right chest wall power injectable Port-A-Cath with tip at the cavoatrial junction via a right internal jugular vein approach. There is mild cardiomegaly and mild pulmonary edema. No focal airspace consolidation or pleural effusion. Numerous skeletal metastases are redemonstrated. IMPRESSION: Cardiomegaly with mild pulmonary edema. Extensive skeletal metastatic disease. Electronically Signed   By: Ulyses Jarred M.D.   On: 02/13/2018 14:54     Assessment and plan- Patient is a  72 y.o. 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 Mets to bone , liver, LN and skin.    He is here for on treatment assessment prior to cycle 2 of  Hemoglobin is down to 6.3 likely due to bone marrow involvement from malignancy.  We will transfuse him 2 units of PRBC today.  His platelet count is 75 which is at his baseline.  Continue to monitor.  He will also get his Lupron shot today  I will proceed with cycle 2 of Opdivo today.  He is not currently a candidate for chemotherapy and does not desire hospice either.  Opdivo remains his last option to see if his malignancy can be controlled.  I will plan to get repeat scans after 4 cycles.  He will come next week for a CBC check for possible blood transfusion.  I will see him back in 2 weeks time with CBC and CMP for the next cycle of Opdivo   Visit Diagnosis 1. Symptomatic anemia   2. Prostate cancer metastatic to bone ([place])   3. Encounter for antineoplastic immunotherapy      Dr. Randa Evens, MD, MPH [facility] at [facility] [phone] 02/22/2018 12:41 PM

## 2018-02-23 LAB — BPAM RBC
BLOOD PRODUCT EXPIRATION DATE: 201910072359
Blood Product Expiration Date: 201910082359
ISSUE DATE / TIME: 201909131037
ISSUE DATE / TIME: 201909131214
UNIT TYPE AND RH: 6200
Unit Type and Rh: 6200

## 2018-02-23 LAB — TYPE AND SCREEN
ABO/RH(D): A POS
ANTIBODY SCREEN: NEGATIVE
UNIT DIVISION: 0
Unit division: 0

## 2018-02-28 ENCOUNTER — Inpatient Hospital Stay: Payer: Medicare Other

## 2018-02-28 ENCOUNTER — Ambulatory Visit: Payer: Medicare Other | Admitting: Oncology

## 2018-02-28 DIAGNOSIS — C61 Malignant neoplasm of prostate: Secondary | ICD-10-CM | POA: Diagnosis not present

## 2018-02-28 DIAGNOSIS — C7951 Secondary malignant neoplasm of bone: Secondary | ICD-10-CM

## 2018-02-28 DIAGNOSIS — D649 Anemia, unspecified: Secondary | ICD-10-CM

## 2018-02-28 LAB — CBC WITH DIFFERENTIAL/PLATELET
BASOS ABS: 0 10*3/uL (ref 0–0.1)
Basophils Relative: 1 %
EOS PCT: 1 %
Eosinophils Absolute: 0 10*3/uL (ref 0–0.7)
HEMATOCRIT: 23.7 % — AB (ref 40.0–52.0)
Hemoglobin: 8.2 g/dL — ABNORMAL LOW (ref 13.0–18.0)
Lymphocytes Relative: 8 %
Lymphs Abs: 0.4 10*3/uL — ABNORMAL LOW (ref 1.0–3.6)
MCH: 32.1 pg (ref 26.0–34.0)
MCHC: 34.8 g/dL (ref 32.0–36.0)
MCV: 92.4 fL (ref 80.0–100.0)
MONO ABS: 0.5 10*3/uL (ref 0.2–1.0)
Monocytes Relative: 12 %
NEUTROS ABS: 3.5 10*3/uL (ref 1.4–6.5)
Neutrophils Relative %: 78 %
Platelets: 56 10*3/uL — ABNORMAL LOW (ref 150–440)
RBC: 2.56 MIL/uL — ABNORMAL LOW (ref 4.40–5.90)
RDW: 16.7 % — ABNORMAL HIGH (ref 11.5–14.5)
WBC: 4.4 10*3/uL (ref 3.8–10.6)

## 2018-02-28 LAB — SAMPLE TO BLOOD BANK

## 2018-02-28 MED ORDER — HEPARIN SOD (PORK) LOCK FLUSH 100 UNIT/ML IV SOLN
500.0000 [IU] | Freq: Once | INTRAVENOUS | Status: AC
  Administered 2018-02-28: 500 [IU] via INTRAVENOUS
  Filled 2018-02-28: qty 5

## 2018-02-28 MED ORDER — SODIUM CHLORIDE 0.9% FLUSH
10.0000 mL | Freq: Once | INTRAVENOUS | Status: AC
  Administered 2018-02-28: 10 mL via INTRAVENOUS
  Filled 2018-02-28: qty 10

## 2018-03-08 ENCOUNTER — Inpatient Hospital Stay: Payer: Medicare Other

## 2018-03-08 ENCOUNTER — Encounter: Payer: Self-pay | Admitting: Oncology

## 2018-03-08 ENCOUNTER — Inpatient Hospital Stay (HOSPITAL_BASED_OUTPATIENT_CLINIC_OR_DEPARTMENT_OTHER): Payer: Medicare Other | Admitting: Oncology

## 2018-03-08 VITALS — BP 180/82 | HR 79 | Temp 96.2°F | Resp 18

## 2018-03-08 VITALS — BP 141/68 | HR 84 | Temp 97.8°F | Resp 18 | Ht 70.0 in | Wt 231.0 lb

## 2018-03-08 DIAGNOSIS — C7951 Secondary malignant neoplasm of bone: Principal | ICD-10-CM

## 2018-03-08 DIAGNOSIS — C779 Secondary and unspecified malignant neoplasm of lymph node, unspecified: Secondary | ICD-10-CM

## 2018-03-08 DIAGNOSIS — C787 Secondary malignant neoplasm of liver and intrahepatic bile duct: Secondary | ICD-10-CM | POA: Diagnosis not present

## 2018-03-08 DIAGNOSIS — D649 Anemia, unspecified: Secondary | ICD-10-CM

## 2018-03-08 DIAGNOSIS — C61 Malignant neoplasm of prostate: Secondary | ICD-10-CM

## 2018-03-08 DIAGNOSIS — C7952 Secondary malignant neoplasm of bone marrow: Secondary | ICD-10-CM

## 2018-03-08 DIAGNOSIS — F17210 Nicotine dependence, cigarettes, uncomplicated: Secondary | ICD-10-CM

## 2018-03-08 DIAGNOSIS — Z5112 Encounter for antineoplastic immunotherapy: Secondary | ICD-10-CM

## 2018-03-08 DIAGNOSIS — C7A1 Malignant poorly differentiated neuroendocrine tumors: Secondary | ICD-10-CM

## 2018-03-08 DIAGNOSIS — Z79899 Other long term (current) drug therapy: Secondary | ICD-10-CM

## 2018-03-08 LAB — CBC WITH DIFFERENTIAL/PLATELET
BASOS ABS: 0 10*3/uL (ref 0–0.1)
BASOS PCT: 1 %
Eosinophils Absolute: 0 10*3/uL (ref 0–0.7)
Eosinophils Relative: 1 %
HEMATOCRIT: 16.6 % — AB (ref 40.0–52.0)
Hemoglobin: 5.6 g/dL — ABNORMAL LOW (ref 13.0–18.0)
LYMPHS PCT: 9 %
Lymphs Abs: 0.4 10*3/uL — ABNORMAL LOW (ref 1.0–3.6)
MCH: 31.5 pg (ref 26.0–34.0)
MCHC: 33.8 g/dL (ref 32.0–36.0)
MCV: 93.4 fL (ref 80.0–100.0)
MONO ABS: 0.6 10*3/uL (ref 0.2–1.0)
Monocytes Relative: 14 %
NEUTROS PCT: 75 %
Neutro Abs: 2.9 10*3/uL (ref 1.4–6.5)
PLATELETS: 51 10*3/uL — AB (ref 150–440)
RBC: 1.78 MIL/uL — AB (ref 4.40–5.90)
RDW: 17.5 % — AB (ref 11.5–14.5)
WBC: 3.9 10*3/uL (ref 3.8–10.6)

## 2018-03-08 LAB — COMPREHENSIVE METABOLIC PANEL
ALBUMIN: 2.6 g/dL — AB (ref 3.5–5.0)
ALK PHOS: 579 U/L — AB (ref 38–126)
ALT: 23 U/L (ref 0–44)
ANION GAP: 7 (ref 5–15)
AST: 34 U/L (ref 15–41)
BILIRUBIN TOTAL: 0.8 mg/dL (ref 0.3–1.2)
BUN: 14 mg/dL (ref 8–23)
CALCIUM: 7.9 mg/dL — AB (ref 8.9–10.3)
CO2: 25 mmol/L (ref 22–32)
Chloride: 107 mmol/L (ref 98–111)
Creatinine, Ser: 0.77 mg/dL (ref 0.61–1.24)
Glucose, Bld: 107 mg/dL — ABNORMAL HIGH (ref 70–99)
POTASSIUM: 4.2 mmol/L (ref 3.5–5.1)
Sodium: 139 mmol/L (ref 135–145)
TOTAL PROTEIN: 6.1 g/dL — AB (ref 6.5–8.1)

## 2018-03-08 LAB — SAMPLE TO BLOOD BANK

## 2018-03-08 LAB — PREPARE RBC (CROSSMATCH)

## 2018-03-08 MED ORDER — SODIUM CHLORIDE 0.9% IV SOLUTION
250.0000 mL | Freq: Once | INTRAVENOUS | Status: AC
  Administered 2018-03-08: 250 mL via INTRAVENOUS
  Filled 2018-03-08: qty 250

## 2018-03-08 MED ORDER — ACETAMINOPHEN 325 MG PO TABS
650.0000 mg | ORAL_TABLET | Freq: Once | ORAL | Status: AC
  Administered 2018-03-08: 650 mg via ORAL
  Filled 2018-03-08: qty 2

## 2018-03-08 MED ORDER — HEPARIN SOD (PORK) LOCK FLUSH 100 UNIT/ML IV SOLN
500.0000 [IU] | Freq: Every day | INTRAVENOUS | Status: DC | PRN
  Filled 2018-03-08 (×2): qty 5

## 2018-03-08 MED ORDER — FUROSEMIDE 10 MG/ML IJ SOLN
20.0000 mg | Freq: Once | INTRAMUSCULAR | Status: AC
  Administered 2018-03-08: 20 mg via INTRAMUSCULAR
  Filled 2018-03-08: qty 2

## 2018-03-08 MED ORDER — SODIUM CHLORIDE 0.9% FLUSH
10.0000 mL | INTRAVENOUS | Status: AC | PRN
  Administered 2018-03-08: 10 mL
  Filled 2018-03-08: qty 10

## 2018-03-08 MED ORDER — SODIUM CHLORIDE 0.9 % IV SOLN
Freq: Once | INTRAVENOUS | Status: AC
  Administered 2018-03-08: 09:00:00 via INTRAVENOUS
  Filled 2018-03-08: qty 250

## 2018-03-08 MED ORDER — SODIUM CHLORIDE 0.9 % IV SOLN
240.0000 mg | Freq: Once | INTRAVENOUS | Status: AC
  Administered 2018-03-08: 240 mg via INTRAVENOUS
  Filled 2018-03-08: qty 24

## 2018-03-08 NOTE — Progress Notes (Signed)
No new changes noted today 

## 2018-03-08 NOTE — Progress Notes (Signed)
Hgb: 5.6, platelets: 51,000. MD, Dr. [name], already aware. Per MD order proceed with scheduled Opdivo treatment today. Patient is also getting 2 units pRBCs today.

## 2018-03-09 LAB — BPAM RBC
Blood Product Expiration Date: 201910202359
Blood Product Expiration Date: 201910202359
ISSUE DATE / TIME: 201909271051
ISSUE DATE / TIME: 201909271306
UNIT TYPE AND RH: 6200
Unit Type and Rh: 6200

## 2018-03-09 LAB — TYPE AND SCREEN
ABO/RH(D): A POS
ANTIBODY SCREEN: NEGATIVE
UNIT DIVISION: 0
UNIT DIVISION: 0

## 2018-03-11 NOTE — Progress Notes (Signed)
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 Mets to bone , liver, LN and skin   Chief complaint/ Reason for visit-on treatment assessment prior to cycle 3 of Opdivo  Heme/Onc history: 1. patient is 72 year old male with a past medical history significant for hypertension diabetes and COPD among other medical problems. He was admitted to the hospital in October 2017 with some symptoms of dizziness and abdominal pain. CT angiogram abdomen incidentally showed sclerotic metastatic disease throughout the visualized spine.   2. This was followed by an MRI of the lumbar spine which showed multifocal T1 and T2 hypointense lesions with areas of increased signal on STIR sequence are seen in multiple vertebral bodies. Largest lesions are in T11 T12 L1 and in the imaged sacrum. This is consistent with metastatic disease. Primary lesion is not identified. MRI brain showed no acute intracranial abnormality.   3. Patient was noted to have an elevated PSA and was referred to urology as an outpatient. He was recently seen by Dr. Tresa Moore on 05/29/2016. PSA was repeated at that time which came back elevated at 10.3. 3 months over the value was 10.81.  4. Bone scan on 07/07/16 showed : Widespread radiotracer uptake over the axial and appendicular skeleton as described compatible with metastatic disease and correlating with sclerotic lesions on Ct. CT chest on 07/07/16 showed: Scattered sclerotic osseous metastatic lesions throughout spine, pelvis, proximal femora, ribs, and manubrium.  Prostatic enlargement with thickened bladder wall question related to chronic bladder outlet obstruction.  Spiculated 12 x 11 x 7 mm LEFT upper lobe nodule question primary pulmonary neoplasm. Stable nonspecific small LEFT adrenal nodule. Single minimally enlarged AP window lymph node. BILATERAL inguinal hernias containing fat. 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 Recommend annual imaging followup by CTA or MRA.   5. Anemia work up from 06/20/16 was as follows: CBC showed white count of 5.2, H&H of 8.1/25 and a platelet count of 164. CMP showed elevated alkaline phosphatase of 391. Multiple myeloma panel showed normal quantitative immunoglobulins and no monoclonal M protein.Vitamin B12 level was low normal at 225. LDH was mildly elevated at 234. Ferritin was low normal at 27. Reticulocyte count was 2.4% inappropriately low for the degree of anemia. Haptoglobin was elevated at 273.  6. Patients case was discussed at tumor board and plan was to watch the lung nodule with scans as it was in a difficult area to biopsy. CT guided bone biopsy was obtained which showed metastatic prostate cancer for which he is on lupron  7. Given that he does not have visceral or lymph node metastasis, docetaxel/zytigawas not started. Also patient lives alone and does not have a good social support or means of communication. Those options to be consideredat progression.   8. Bone scan on 12/22/2016 showed significantly worsening osseous metastatic disease. Left upper lobe nodule was slightly smaller in size. Zytiga was added in setting of worsening bone disease along with prednisonein aug 2018. Lupron administered 02/13/2017.  9. Scans after 3 months of zytiga showed progression of bone mets. PSA remains under control. Case discussed at tumor  board and plan was to hold zytiga and proceed with [facility] for 6  months followed by repeat scans. xofigo started in jan 2019. Patientreceived 6 doses of xofigo. Last dose on 11/21/17  10. He was then found to have 2 new skin lesions- one showed SCC. 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 PET/CT scan on 12/11/17 showed:1. Examination is positive for extensive FDG avid, hypermetabolic liver metastases. 2. Widespread hypermetabolic sclerotic bone metastases. 3. Increased uptake within soft tissue in the prostate bed compatible with residual/recurrent hypermetabolic local tumor. 4. Hypermetabolic left-sided mediastinal and right hilar lymph nodes. Suspicious for nodal metastasis. 5. Aortic atherosclerosis and coronary artery atherosclerotic calcifications. Aortic Atherosclerosis (ICD10-I70.0).  12. Path shows high grade neuroendocrine carcinoma positive for PSA suggestive of prostate primary  13.  Patient's baseline performance status is 2 and he has significant pancytopenia likely secondary to bone marrow involvement with malignancy.  He was not deemed to be a candidate for chemotherapy and palliative Opdivo was started in August 2019.  Interval history-patient reports feeling fatigue but is able to walk around his trailer.  He also states that he went grocery shopping a couple of days ago.  Leg swelling is fairly stable.  He denies any pain  ECOG PS- 2 Pain scale- 0 Opioid associated constipation- no  Review of systems- Review of Systems  Constitutional: Positive for malaise/fatigue. Negative for chills, fever and weight loss.  HENT: Negative for congestion, ear discharge and nosebleeds.   Eyes: Negative for blurred vision.  Respiratory: Positive for shortness of breath. Negative for cough, hemoptysis, sputum production and wheezing.   Cardiovascular: Positive for leg swelling. Negative for chest pain, palpitations, orthopnea and claudication.  Gastrointestinal: Negative for abdominal  pain, blood in stool, constipation, diarrhea, heartburn, melena, nausea and vomiting.  Genitourinary: Negative for dysuria, flank pain, frequency, hematuria and urgency.  Musculoskeletal: Negative for back pain, joint pain and myalgias.  Skin: Negative for rash.  Neurological: Negative for dizziness, tingling, focal weakness, seizures, weakness and headaches.  Endo/Heme/Allergies: Does not bruise/bleed easily.  Psychiatric/Behavioral: Negative for depression and suicidal ideas. The patient does not have insomnia.       No Known Allergies   Past Medical History:  Diagnosis Date  . Anemia   . Arthritis   . Cancer [facility])    prostate  . COPD (chronic obstructive pulmonary disease) ([place])   . Coronary artery disease   . Cough    chronic  . Diabetes mellitus without complication ([place])   . Edema   . Elevated PSA   . Headache   . Hypertension   . Orthopnea   . Oxygen deficit    o2 prn  . Prostate cancer ([place])   . Shortness of breath dyspnea   . Sleep apnea    cpap     Past Surgical History:  Procedure Laterality Date  . CARDIAC CATHETERIZATION  2014  . COLONOSCOPY    . CYSTOSCOPY W/ RETROGRADES Bilateral 10/18/2016   Procedure: CYSTOSCOPY WITH RETROGRADE PYELOGRAM;  Surgeon: Hollice Espy, MD;  Location: [facility] ORS;  Service: Urology;  Laterality: Bilateral;  . EYE SURGERY    . KNEE ARTHROSCOPY    . PORTA CATH INSERTION N/A 02/13/2018   Procedure: PORTA CATH INSERTION;  Surgeon: Katha Cabal, MD;  Location: [place] [facility];  Service: Cardiovascular;  Laterality: N/A;  . TRANSURETHRAL RESECTION OF BLADDER TUMOR N/A 10/18/2016   Procedure: TRANSURETHRAL RESECTION OF BLADDER TUMOR (TURBT);  Surgeon: Hollice Espy, MD;  Location: [facility] ORS;  Service: Urology;  Laterality: N/A;  . TRANSURETHRAL RESECTION OF PROSTATE N/A 10/18/2016   Procedure: TRANSURETHRAL RESECTION OF THE PROSTATE (TURP) CHANNEL TURP;  Surgeon: Hollice Espy, MD;  Location: [facility] ORS;  Service: Urology;   Laterality: N/A;    Social History   Socioeconomic History  . Marital status: Single    Spouse name: Not on file  . Number of children: Not on file  . Years of education: Not on file  . Highest education level: Not on file  Occupational History  . Not on file  Social Needs  . Financial resource strain: Not on file  . Food insecurity:    Worry: Not on file    Inability: Not on file  . Transportation needs:    Medical: Not on file    Non-medical: Not on file  Tobacco Use  . Smoking status: Current Every Day Smoker    Packs/day: 0.50    Years: 55.00    Pack years: 27.50    Types: Cigarettes  . Smokeless tobacco: Never Used  Substance and Sexual Activity  . Alcohol use: No  . Drug use: No  . Sexual activity: Not Currently  Lifestyle  . Physical activity:    Days per week: Not on file    Minutes per session: Not on file  . Stress: Not on file  Relationships  . Social connections:    Talks on phone: Not on file    Gets together: Not on file    Attends religious service: Not on file    Active member of club or organization: Not on file    Attends meetings of clubs or organizations: Not on file    Relationship status: Not on file  . Intimate partner violence:    Fear of current or ex partner: Not on file    Emotionally abused: Not on file    Physically abused: Not on file    Forced sexual activity: Not on file  Other Topics Concern  . Not on file  Social History Narrative  . Not on file    Family History  Problem Relation Age of Onset  . CVA Mother   . CAD Father      Current Outpatient Medications:  .  amiodarone (PACERONE) 200 MG tablet, Take 1 tablet (200 mg total) by mouth 2 (two) times daily., Disp: 60 tablet, Rfl: 0 .  amLODipine (NORVASC) 10 MG tablet, Take 1 tablet (10 mg total) by mouth daily., Disp: 30 tablet, Rfl: 0 .  DULoxetine (CYMBALTA) 30 MG capsule, Take 1 capsule (30 mg total) by mouth daily., Disp: 30 capsule, Rfl: 3 .  finasteride  (PROSCAR) 5 MG tablet, Take 5 mg by mouth daily., Disp: , Rfl:  .  Fluticasone-Salmeterol (ADVAIR) 250-50 MCG/DOSE AEPB, Inhale 1 puff into the lungs 2 (two) times daily., Disp: , Rfl:  .  metoprolol tartrate (LOPRESSOR) 100 MG tablet, Take 1 tablet (100 mg total) by mouth 2 (two) times daily., Disp: 60 tablet, Rfl: 0 .  predniSONE (STERAPRED UNI-PAK 21 TAB) 10 MG (21) TBPK tablet, Take 6 tabs first day, 5 tab on day 2, then 4 on day 3rd, 3 tabs on day 4th , 2 tab on day 5th, and 1 tab on 6th day., Disp: 21 tablet, Rfl: 0 .  tamsulosin (FLOMAX) 0.4 MG CAPS capsule, Take 1 capsule (0.4 mg total) by mouth daily., Disp: 90 capsule, Rfl: 3 .  vitamin B-12 (CYANOCOBALAMIN) 1000 MCG tablet, Take 1 tablet (1,000  mcg total) by mouth daily., Disp: 30 tablet, Rfl: 3 .  ketoconazole (NIZORAL) 2 % cream, Apply 1 application topically daily as needed. , Disp: , Rfl:  .  lidocaine-prilocaine (EMLA) cream, Apply to affected area once (Patient not taking: Reported on 01/14/2018), Disp: 30 g, Rfl: 3 .  pantoprazole (PROTONIX) 40 MG tablet, Take 1 tablet (40 mg total) by mouth daily. (Patient not taking: Reported on 02/13/2018), Disp: 30 tablet, Rfl: 0  Physical exam:  Vitals:   03/08/18 0821  BP: (!) 141/68  Pulse: 84  Resp: 18  Temp: 97.8 F (36.6 C)  TempSrc: Tympanic  SpO2: 97%  Weight: 231 lb (104.8 kg)  Height: 5' 10"  (1.778 m)   Physical Exam  Constitutional: He is oriented to person, place, and time. He appears well-developed and well-nourished.  Elderly gentleman in no acute distress.  Pursed lip breathing  HENT:  Head: Normocephalic and atraumatic.  Eyes: Pupils are equal, round, and reactive to light. EOM are normal.  Neck: Normal range of motion.  Cardiovascular: Normal rate, regular rhythm and normal heart sounds.  Pulmonary/Chest: Effort normal and breath sounds normal.  Abdominal: Soft. Bowel sounds are normal.  Musculoskeletal: He exhibits edema.  Neurological: He is alert and oriented to  person, place, and time.  Skin: Skin is warm and dry.     CMP Latest Ref Rng & Units 03/08/2018  Glucose 70 - 99 mg/dL 107(H)  BUN 8 - 23 mg/dL 14  Creatinine 0.61 - 1.24 mg/dL 0.77  Sodium 135 - 145 mmol/L 139  Potassium 3.5 - 5.1 mmol/L 4.2  Chloride 98 - 111 mmol/L 107  CO2 22 - 32 mmol/L 25  Calcium 8.9 - 10.3 mg/dL 7.9(L)  Total Protein 6.5 - 8.1 g/dL 6.1(L)  Total Bilirubin 0.3 - 1.2 mg/dL 0.8  Alkaline Phos 38 - 126 U/L 579(H)  AST 15 - 41 U/L 34  ALT 0 - 44 U/L 23   CBC Latest Ref Rng & Units 03/08/2018  WBC 3.8 - 10.6 K/uL 3.9  Hemoglobin 13.0 - 18.0 g/dL 5.6(L)  Hematocrit 40.0 - 52.0 % 16.6(L)  Platelets 150 - 440 K/uL 51(L)    No images are attached to the encounter.  Dg Chest Port 1 View  Result Date: 02/13/2018 CLINICAL DATA:  Hypoxia EXAM: PORTABLE CHEST 1 VIEW COMPARISON:  PET CT 12/11/2017 Chest radiograph 06/18/2017 FINDINGS: There is a right chest wall power injectable Port-A-Cath with tip at the cavoatrial junction via a right internal jugular vein approach. There is mild cardiomegaly and mild pulmonary edema. No focal airspace consolidation or pleural effusion. Numerous skeletal metastases are redemonstrated. IMPRESSION: Cardiomegaly with mild pulmonary edema. Extensive skeletal metastatic disease. Electronically Signed   By: Ulyses Jarred M.D.   On: 02/13/2018 14:54     Assessment and plan- Patient is a 72 y.o. 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 Mets to bone , liver, LN and skin. He is here for on treatment assessment prior to cycle 3 of Opdivo  Patient is again severely anemic today and his hemoglobin is down to 5.6.  He has been roughly requiring blood transfusion every other week.  He is not had a good bone marrow recovery despite starting Opdivo.  His platelet counts continue to trend down as well from the 80s to 50s.  I suspect he is not responding to [place].  His overall  prognosis from his underlying high-grade neuroendocrine carcinoma of the prostate is poor.  Time and again  I have discussed hospice with him and patient has denied hospice.  I again explained to him that he does not have any need for adoption Dr. Armida Sans and he is not a candidate for systemic chemotherapy with carboplatin and etoposide given his significant cytopenias as well as performance status.  He will be receiving 2 units of PRBC today.  He will also proceed with Opdivo today  He will return next week for labs only with CBC with differential for possible blood transfusion  We will repeat CT chest abdomen and pelvis as well as bone scan in 2week's time and I will see him after the scans.  Check CBC and CMP on that day and possible transfusion.  If there is evidence of disease progression on scans he is agreeable to considering hospice at that time   Total face to face encounter time for this patient visit was 40 min. >50% of the time was  spent in counseling and coordination of care.     Visit Diagnosis 1. Symptomatic anemia   2. Prostate cancer metastatic to bone ([place])   3. Secondary malignant neoplasm of bone and bone marrow ([facility])   4. Encounter for antineoplastic immunotherapy      Dr. Randa Evens, MD, MPH [facility] at [facility] [phone] 03/11/2018 8:14 AM

## 2018-03-13 ENCOUNTER — Other Ambulatory Visit: Payer: Self-pay | Admitting: *Deleted

## 2018-03-13 ENCOUNTER — Inpatient Hospital Stay: Attending: Oncology

## 2018-03-13 DIAGNOSIS — C7951 Secondary malignant neoplasm of bone: Principal | ICD-10-CM

## 2018-03-13 DIAGNOSIS — C787 Secondary malignant neoplasm of liver and intrahepatic bile duct: Secondary | ICD-10-CM | POA: Insufficient documentation

## 2018-03-13 DIAGNOSIS — I10 Essential (primary) hypertension: Secondary | ICD-10-CM | POA: Diagnosis not present

## 2018-03-13 DIAGNOSIS — D649 Anemia, unspecified: Secondary | ICD-10-CM

## 2018-03-13 DIAGNOSIS — D61818 Other pancytopenia: Secondary | ICD-10-CM | POA: Insufficient documentation

## 2018-03-13 DIAGNOSIS — C61 Malignant neoplasm of prostate: Secondary | ICD-10-CM

## 2018-03-13 DIAGNOSIS — J449 Chronic obstructive pulmonary disease, unspecified: Secondary | ICD-10-CM | POA: Diagnosis not present

## 2018-03-13 DIAGNOSIS — I2510 Atherosclerotic heart disease of native coronary artery without angina pectoris: Secondary | ICD-10-CM | POA: Insufficient documentation

## 2018-03-13 DIAGNOSIS — Z79899 Other long term (current) drug therapy: Secondary | ICD-10-CM | POA: Insufficient documentation

## 2018-03-13 DIAGNOSIS — F17210 Nicotine dependence, cigarettes, uncomplicated: Secondary | ICD-10-CM | POA: Insufficient documentation

## 2018-03-13 DIAGNOSIS — G4730 Sleep apnea, unspecified: Secondary | ICD-10-CM | POA: Diagnosis not present

## 2018-03-13 DIAGNOSIS — N400 Enlarged prostate without lower urinary tract symptoms: Secondary | ICD-10-CM | POA: Diagnosis not present

## 2018-03-13 DIAGNOSIS — Z5112 Encounter for antineoplastic immunotherapy: Secondary | ICD-10-CM | POA: Diagnosis present

## 2018-03-13 DIAGNOSIS — E119 Type 2 diabetes mellitus without complications: Secondary | ICD-10-CM | POA: Insufficient documentation

## 2018-03-13 DIAGNOSIS — C7800 Secondary malignant neoplasm of unspecified lung: Secondary | ICD-10-CM | POA: Diagnosis not present

## 2018-03-13 DIAGNOSIS — C792 Secondary malignant neoplasm of skin: Secondary | ICD-10-CM | POA: Diagnosis not present

## 2018-03-13 DIAGNOSIS — M1990 Unspecified osteoarthritis, unspecified site: Secondary | ICD-10-CM | POA: Diagnosis not present

## 2018-03-13 LAB — COMPREHENSIVE METABOLIC PANEL
ALK PHOS: 767 U/L — AB (ref 38–126)
ALT: 23 U/L (ref 0–44)
ANION GAP: 9 (ref 5–15)
AST: 41 U/L (ref 15–41)
Albumin: 2.9 g/dL — ABNORMAL LOW (ref 3.5–5.0)
BUN: 13 mg/dL (ref 8–23)
CALCIUM: 8.2 mg/dL — AB (ref 8.9–10.3)
CO2: 25 mmol/L (ref 22–32)
Chloride: 103 mmol/L (ref 98–111)
Creatinine, Ser: 0.76 mg/dL (ref 0.61–1.24)
GFR calc Af Amer: >60 mL/min (ref >60)
Glucose, Bld: 127 mg/dL — ABNORMAL HIGH (ref 70–99)
Potassium: 4.2 mmol/L (ref 3.5–5.1)
Sodium: 137 mmol/L (ref 135–145)
Total Bilirubin: 1 mg/dL (ref 0.3–1.2)
Total Protein: 6.3 g/dL — ABNORMAL LOW (ref 6.5–8.1)

## 2018-03-13 LAB — CBC WITH DIFFERENTIAL/PLATELET
BASOS PCT: 1 %
Basophils Absolute: 0 10*3/uL (ref 0–0.1)
Eosinophils Absolute: 0.1 10*3/uL (ref 0–0.7)
Eosinophils Relative: 1 %
HEMATOCRIT: 23.6 % — AB (ref 40.0–52.0)
HEMOGLOBIN: 8 g/dL — AB (ref 13.0–18.0)
Lymphocytes Relative: 9 %
Lymphs Abs: 0.4 10*3/uL — ABNORMAL LOW (ref 1.0–3.6)
MCH: 30.9 pg (ref 26.0–34.0)
MCHC: 34.1 g/dL (ref 32.0–36.0)
MCV: 90.7 fL (ref 80.0–100.0)
Monocytes Absolute: 0.6 10*3/uL (ref 0.2–1.0)
Monocytes Relative: 16 %
NEUTROS ABS: 2.8 10*3/uL (ref 1.4–6.5)
NEUTROS PCT: 73 %
Platelets: 56 10*3/uL — ABNORMAL LOW (ref 150–440)
RBC: 2.6 MIL/uL — ABNORMAL LOW (ref 4.40–5.90)
RDW: 16.1 % — ABNORMAL HIGH (ref 11.5–14.5)
WBC: 3.9 10*3/uL (ref 3.8–10.6)

## 2018-03-13 LAB — SAMPLE TO BLOOD BANK

## 2018-03-14 ENCOUNTER — Inpatient Hospital Stay

## 2018-03-18 ENCOUNTER — Ambulatory Visit
Admission: RE | Admit: 2018-03-18 | Discharge: 2018-03-18 | Disposition: A | Discharge status: Home / Self Care | Payer: Medicare Other | Source: Ambulatory Visit | Attending: Oncology | Admitting: Oncology

## 2018-03-18 DIAGNOSIS — J90 Pleural effusion, not elsewhere classified: Secondary | ICD-10-CM | POA: Insufficient documentation

## 2018-03-18 DIAGNOSIS — R590 Localized enlarged lymph nodes: Secondary | ICD-10-CM | POA: Insufficient documentation

## 2018-03-18 DIAGNOSIS — C61 Malignant neoplasm of prostate: Secondary | ICD-10-CM | POA: Insufficient documentation

## 2018-03-18 DIAGNOSIS — R918 Other nonspecific abnormal finding of lung field: Secondary | ICD-10-CM | POA: Diagnosis not present

## 2018-03-18 DIAGNOSIS — C7951 Secondary malignant neoplasm of bone: Secondary | ICD-10-CM | POA: Insufficient documentation

## 2018-03-18 DIAGNOSIS — C787 Secondary malignant neoplasm of liver and intrahepatic bile duct: Secondary | ICD-10-CM | POA: Insufficient documentation

## 2018-03-18 MED ORDER — IOPAMIDOL (ISOVUE-300) INJECTION 61%
100.0000 mL | Freq: Once | INTRAVENOUS | Status: AC | PRN
  Administered 2018-03-18: 100 mL via INTRAVENOUS

## 2018-03-20 ENCOUNTER — Encounter: Payer: Self-pay | Admitting: Pharmacy Technician

## 2018-03-20 NOTE — Progress Notes (Unsigned)
Patient no longer getting Opdivio from [facility]. based on disease progression. Last DOS covered is 03/08/2018.

## 2018-03-22 ENCOUNTER — Telehealth: Payer: Self-pay | Admitting: *Deleted

## 2018-03-22 ENCOUNTER — Inpatient Hospital Stay

## 2018-03-22 ENCOUNTER — Other Ambulatory Visit: Payer: Self-pay | Admitting: *Deleted

## 2018-03-22 ENCOUNTER — Encounter: Payer: Self-pay | Admitting: Oncology

## 2018-03-22 ENCOUNTER — Ambulatory Visit (HOSPITAL_BASED_OUTPATIENT_CLINIC_OR_DEPARTMENT_OTHER): Admitting: Hospice and Palliative Medicine

## 2018-03-22 ENCOUNTER — Inpatient Hospital Stay (HOSPITAL_BASED_OUTPATIENT_CLINIC_OR_DEPARTMENT_OTHER): Admitting: Oncology

## 2018-03-22 VITALS — BP 137/75 | HR 82 | Temp 97.3°F | Resp 18 | Ht 70.0 in | Wt 231.0 lb

## 2018-03-22 DIAGNOSIS — Z515 Encounter for palliative care: Secondary | ICD-10-CM

## 2018-03-22 DIAGNOSIS — C61 Malignant neoplasm of prostate: Secondary | ICD-10-CM

## 2018-03-22 DIAGNOSIS — Z79899 Other long term (current) drug therapy: Secondary | ICD-10-CM

## 2018-03-22 DIAGNOSIS — C792 Secondary malignant neoplasm of skin: Secondary | ICD-10-CM

## 2018-03-22 DIAGNOSIS — C787 Secondary malignant neoplasm of liver and intrahepatic bile duct: Secondary | ICD-10-CM | POA: Diagnosis not present

## 2018-03-22 DIAGNOSIS — Z5112 Encounter for antineoplastic immunotherapy: Secondary | ICD-10-CM | POA: Diagnosis not present

## 2018-03-22 DIAGNOSIS — C7951 Secondary malignant neoplasm of bone: Principal | ICD-10-CM

## 2018-03-22 DIAGNOSIS — R531 Weakness: Secondary | ICD-10-CM | POA: Diagnosis not present

## 2018-03-22 DIAGNOSIS — C7800 Secondary malignant neoplasm of unspecified lung: Secondary | ICD-10-CM | POA: Diagnosis not present

## 2018-03-22 DIAGNOSIS — Z7189 Other specified counseling: Secondary | ICD-10-CM

## 2018-03-22 DIAGNOSIS — D649 Anemia, unspecified: Secondary | ICD-10-CM

## 2018-03-22 DIAGNOSIS — D61818 Other pancytopenia: Secondary | ICD-10-CM

## 2018-03-22 DIAGNOSIS — F17210 Nicotine dependence, cigarettes, uncomplicated: Secondary | ICD-10-CM

## 2018-03-22 LAB — COMPREHENSIVE METABOLIC PANEL
ALK PHOS: 743 U/L — AB (ref 38–126)
ALT: 18 U/L (ref 0–44)
ANION GAP: 6 (ref 5–15)
AST: 36 U/L (ref 15–41)
Albumin: 2.8 g/dL — ABNORMAL LOW (ref 3.5–5.0)
BILIRUBIN TOTAL: 1 mg/dL (ref 0.3–1.2)
BUN: 16 mg/dL (ref 8–23)
CALCIUM: 7.9 mg/dL — AB (ref 8.9–10.3)
CO2: 26 mmol/L (ref 22–32)
CREATININE: 0.8 mg/dL (ref 0.61–1.24)
Chloride: 106 mmol/L (ref 98–111)
GFR calc non Af Amer: >60 mL/min (ref >60)
Glucose, Bld: 128 mg/dL — ABNORMAL HIGH (ref 70–99)
Potassium: 3.9 mmol/L (ref 3.5–5.1)
Sodium: 138 mmol/L (ref 135–145)
TOTAL PROTEIN: 6.1 g/dL — AB (ref 6.5–8.1)

## 2018-03-22 LAB — SAMPLE TO BLOOD BANK

## 2018-03-22 LAB — CBC WITH DIFFERENTIAL/PLATELET
Abs Immature Granulocytes: 0.45 10*3/uL — ABNORMAL HIGH (ref 0.00–0.07)
BASOS PCT: 1 %
Basophils Absolute: 0 10*3/uL (ref 0.0–0.1)
EOS ABS: 0.1 10*3/uL (ref 0.0–0.5)
EOS PCT: 2 %
HEMATOCRIT: 17.9 % — AB (ref 39.0–52.0)
Hemoglobin: 5.7 g/dL — ABNORMAL LOW (ref 13.0–17.0)
IMMATURE GRANULOCYTES: 9 %
LYMPHS ABS: 0.4 10*3/uL — AB (ref 0.7–4.0)
Lymphocytes Relative: 7 %
MCH: 29.5 pg (ref 26.0–34.0)
MCHC: 31.8 g/dL (ref 30.0–36.0)
MCV: 92.7 fL (ref 80.0–100.0)
Monocytes Absolute: 0.7 10*3/uL (ref 0.1–1.0)
Monocytes Relative: 15 %
NEUTROS PCT: 66 %
Neutro Abs: 3.2 10*3/uL (ref 1.7–7.7)
PLATELETS: 50 10*3/uL — AB (ref 150–400)
RBC: 1.93 MIL/uL — ABNORMAL LOW (ref 4.22–5.81)
RDW: 16.2 % — AB (ref 11.5–15.5)
WBC: 4.9 10*3/uL (ref 4.0–10.5)
nRBC: 0 % (ref 0.0–0.2)

## 2018-03-22 LAB — PREPARE RBC (CROSSMATCH)

## 2018-03-22 MED ORDER — SODIUM CHLORIDE 0.9% IV SOLUTION
250.0000 mL | Freq: Once | INTRAVENOUS | Status: AC
  Administered 2018-03-22: 250 mL via INTRAVENOUS
  Filled 2018-03-22: qty 250

## 2018-03-22 MED ORDER — FUROSEMIDE 10 MG/ML IJ SOLN
20.0000 mg | Freq: Once | INTRAMUSCULAR | Status: AC
  Administered 2018-03-22: 20 mg via INTRAVENOUS
  Filled 2018-03-22: qty 2

## 2018-03-22 MED ORDER — ACETAMINOPHEN 325 MG PO TABS
650.0000 mg | ORAL_TABLET | Freq: Once | ORAL | Status: AC
  Administered 2018-03-22: 650 mg via ORAL
  Filled 2018-03-22: qty 2

## 2018-03-22 MED ORDER — SODIUM CHLORIDE 0.9% FLUSH
10.0000 mL | INTRAVENOUS | Status: DC | PRN
  Administered 2018-03-22: 10 mL via INTRAVENOUS
  Filled 2018-03-22: qty 10

## 2018-03-22 MED ORDER — HEPARIN SOD (PORK) LOCK FLUSH 100 UNIT/ML IV SOLN
500.0000 [IU] | Freq: Once | INTRAVENOUS | Status: AC
  Administered 2018-03-22: 500 [IU] via INTRAVENOUS
  Filled 2018-03-22: qty 5

## 2018-03-22 NOTE — Progress Notes (Signed)
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 Mets to bone , liver, LN and skin and lungs  Chief complaint/ Reason for visit-discuss results of CT scan  Heme/Onc history: 1. patient is 72 year old male with a past medical history significant for hypertension diabetes and COPD among other medical problems. He was admitted to the hospital in October 2017 with some symptoms of dizziness and abdominal pain. CT angiogram abdomen incidentally showed sclerotic metastatic disease throughout the visualized spine.   2. This was followed by an MRI of the lumbar spine which showed multifocal T1 and T2 hypointense lesions with areas of increased signal on STIR sequence are seen in multiple vertebral bodies. Largest lesions are in T11 T12 L1 and in the imaged sacrum. This is consistent with metastatic disease. Primary lesion is not identified. MRI brain showed no acute intracranial abnormality.   3. Patient was noted to have an elevated PSA and was referred to urology as an outpatient. He was recently seen by Dr. Tresa Moore on 05/29/2016. PSA was repeated at that time which came back elevated at 10.3. 3 months over the value was 10.81.  4. Bone scan on 07/07/16 showed : Widespread radiotracer uptake over the axial and appendicular skeleton as described compatible with metastatic disease and correlating with sclerotic lesions on Ct. CT chest on 07/07/16 showed: Scattered sclerotic osseous metastatic lesions throughout spine, pelvis, proximal femora, ribs, and manubrium. Prostatic enlargement  with thickened bladder wall question related to chronic bladder outlet obstruction.  Spiculated 12 x 11 x 7 mm LEFT upper lobe nodule question primary pulmonary neoplasm. Stable nonspecific small LEFT adrenal nodule. Single minimally enlarged AP window lymph node. BILATERAL inguinal hernias containing fat. 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 Recommend annual imaging followup by CTA or MRA.   5. Anemia work up from 06/20/16 was as follows: CBC showed white count of 5.2, H&H of 8.1/25 and a platelet count of 164. CMP showed elevated alkaline phosphatase of 391. Multiple myeloma panel showed normal quantitative immunoglobulins and no monoclonal M protein.Vitamin B12 level was low normal at 225. LDH was mildly elevated at 234. Ferritin was low normal at 27. Reticulocyte count was 2.4% inappropriately low for the degree of anemia. Haptoglobin was elevated at 273.  6. Patients case was discussed at tumor board and plan was to watch the lung nodule with scans as it was in a difficult area to biopsy. CT guided bone biopsy was obtained which showed metastatic prostate cancer for which he is on lupron  7. Given that he does not have visceral or lymph node metastasis, docetaxel/zytigawas not started. Also patient lives alone and does not have a good social support or means of communication. Those options to be consideredat progression.   8. Bone scan on 12/22/2016 showed significantly worsening osseous metastatic disease. Left upper lobe nodule was slightly smaller in size. Zytiga was added in setting of worsening bone disease along with prednisonein aug 2018. Lupron administered 02/13/2017.  9. Scans after 3 months of zytiga showed progression of bone mets. PSA remains under control. Case discussed at tumor board and plan  was to hold zytiga and proceed with [facility] for 6 months followed by repeat  scans. xofigo started in jan 2019. Patientreceived 6 doses of xofigo. Last dose on 11/21/17  10. He was then found to have 2 new skin lesions- one showed SCC. 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 PET/CT scan on 12/11/17 showed:1. Examination is positive for extensive FDG avid, hypermetabolic liver metastases. 2. Widespread hypermetabolic sclerotic bone metastases. 3. Increased uptake within soft tissue in the prostate bed compatible with residual/recurrent hypermetabolic local tumor. 4. Hypermetabolic left-sided mediastinal and right hilar lymph nodes. Suspicious for nodal metastasis. 5. Aortic atherosclerosis and coronary artery atherosclerotic calcifications. Aortic Atherosclerosis (ICD10-I70.0).  12. Path shows high grade neuroendocrine carcinoma positive for PSA suggestive of prostate primary  13.  Patient's baseline performance status is 2 and he has significant pancytopenia likely secondary to bone marrow involvement with malignancy.  He was not deemed to be a candidate for chemotherapy and palliative Opdivo was started in August 2019.   Interval history-he feels fatigued and reports leg swelling today.  He has not had any falls.  Home based palliative care has been seeing him and patient reports that he also received a cell phone which she is yet to figure out how to use.  ECOG PS- 2 Pain scale- 0 Opioid associated constipation- no  Review of systems- Review of Systems  Constitutional: Positive for malaise/fatigue. Negative for chills, fever and weight loss.  HENT: Negative for congestion, ear discharge and nosebleeds.   Eyes: Negative for blurred vision.  Respiratory: Negative for cough, hemoptysis, sputum production, shortness of breath and wheezing.   Cardiovascular: Positive for leg swelling. Negative for chest pain, palpitations, orthopnea and claudication.  Gastrointestinal: Negative for abdominal pain,  blood in stool, constipation, diarrhea, heartburn, melena, nausea and vomiting.  Genitourinary: Negative for dysuria, flank pain, frequency, hematuria and urgency.  Musculoskeletal: Negative for back pain, joint pain and myalgias.  Skin: Negative for rash.  Neurological: Negative for dizziness, tingling, focal weakness, seizures, weakness and headaches.  Endo/Heme/Allergies: Does not bruise/bleed easily.  Psychiatric/Behavioral: Negative for depression and suicidal ideas. The patient does not have insomnia.       No Known Allergies   Past Medical History:  Diagnosis Date  . Anemia   . Arthritis   . COPD (chronic obstructive pulmonary disease) ([place])   . Coronary artery disease   . Cough    chronic  . Diabetes mellitus without complication ([place])   . Edema   . Elevated PSA   . Headache   . Hypertension   . Orthopnea   . Oxygen deficit    o2 prn  . Prostate cancer ([place])   . Shortness of breath dyspnea   . Sleep apnea    cpap     Past Surgical History:  Procedure Laterality Date  . CARDIAC CATHETERIZATION  2014  . COLONOSCOPY    . CYSTOSCOPY W/ RETROGRADES Bilateral 10/18/2016   Procedure: CYSTOSCOPY WITH RETROGRADE PYELOGRAM;  Surgeon: Hollice Espy, MD;  Location: [facility];  Service: Urology;  Laterality: Bilateral;  . EYE SURGERY    . KNEE ARTHROSCOPY    . PORTA CATH INSERTION N/A 02/13/2018   Procedure: PORTA CATH INSERTION;  Surgeon: Katha Cabal, MD;  Location: [facility];  Service: Cardiovascular;  Laterality: N/A;  . TRANSURETHRAL RESECTION OF BLADDER TUMOR N/A 10/18/2016   Procedure: TRANSURETHRAL RESECTION OF BLADDER TUMOR (TURBT);  Surgeon: Hollice Espy, MD;  Location: [facility];  Service: Urology;  Laterality: N/A;  . TRANSURETHRAL RESECTION OF PROSTATE N/A 10/18/2016   Procedure: TRANSURETHRAL RESECTION OF THE PROSTATE (TURP) CHANNEL TURP;  Surgeon: Hollice Espy, MD;  Location: [facility];  Service: Urology;  Laterality: N/A;    Social History     Socioeconomic History  . Marital status: Single    Spouse name: Not on file  . Number of children: Not on file  . Years of education: Not on file  . Highest education level: Not on file  Occupational History  . Not on file  Social Needs  . Financial resource strain: Not on file  . Food insecurity:    Worry: Not on file    Inability: Not on file  . Transportation needs:    Medical: Not on file    Non-medical: Not on file  Tobacco Use  . Smoking status: Current Every Day Smoker    Packs/day: 0.50    Years: 55.00    Pack years: 27.50    Types: Cigarettes  . Smokeless tobacco: Never Used  Substance and Sexual Activity  . Alcohol use: No  . Drug use: No  . Sexual activity: Not Currently  Lifestyle  . Physical activity:    Days per week: Not on file    Minutes per session: Not on file  . Stress: Not on file  Relationships  . Social connections:    Talks on phone: Not on file    Gets together: Not on file    Attends religious service: Not on file    Active member of club or organization: Not on file    Attends meetings of clubs or organizations: Not on file    Relationship status: Not on file  . Intimate partner violence:    Fear of current or ex partner: Not on file    Emotionally abused: Not on file    Physically abused: Not on file    Forced sexual activity: Not on file  Other Topics Concern  . Not on file  Social History Narrative  . Not on file    Family History  Problem Relation Age of Onset  . CVA Mother   . CAD Father      Current Outpatient Medications:  .  amiodarone (PACERONE) 200 MG tablet, Take 1 tablet (200 mg total) by mouth 2 (two) times daily., Disp: 60 tablet, Rfl: 0 .  amLODipine (NORVASC) 10 MG tablet, Take 1 tablet (10 mg total) by mouth daily., Disp: 30 tablet, Rfl: 0 .  DULoxetine (CYMBALTA) 30 MG capsule, Take 1 capsule (30 mg total) by mouth daily., Disp: 30 capsule, Rfl: 3 .  finasteride (PROSCAR) 5 MG tablet, Take 5 mg by mouth  daily., Disp: , Rfl:  .  Fluticasone-Salmeterol (ADVAIR) 250-50 MCG/DOSE AEPB, Inhale 1 puff into the lungs 2 (two) times daily., Disp: , Rfl:  .  metoprolol tartrate (LOPRESSOR) 100 MG tablet, Take 1 tablet (100 mg total) by mouth 2 (two) times daily., Disp: 60 tablet, Rfl: 0 .  predniSONE (STERAPRED UNI-PAK 21 TAB) 10 MG (21) TBPK tablet, Take 6 tabs first day, 5 tab on day 2, then 4 on day 3rd, 3 tabs on day 4th , 2 tab on day 5th, and 1 tab on 6th day., Disp: 21 tablet, Rfl: 0 .  tamsulosin (FLOMAX) 0.4 MG CAPS capsule, Take 1 capsule (0.4 mg total) by mouth daily., Disp: 90 capsule, Rfl: 3 .  vitamin B-12 (CYANOCOBALAMIN) 1000 MCG tablet, Take 1 tablet (1,000 mcg total) by  mouth daily., Disp: 30 tablet, Rfl: 3 .  ketoconazole (NIZORAL) 2 % cream, Apply 1 application topically daily as needed. , Disp: , Rfl:  .  lidocaine-prilocaine (EMLA) cream, Apply to affected area once (Patient not taking: Reported on 01/14/2018), Disp: 30 g, Rfl: 3 .  pantoprazole (PROTONIX) 40 MG tablet, Take 1 tablet (40 mg total) by mouth daily. (Patient not taking: Reported on 02/13/2018), Disp: 30 tablet, Rfl: 0 No current facility-administered medications for this visit.   Facility-Administered Medications Ordered in Other Visits:  .  0.9 %  sodium chloride infusion (Manually program via Guardrails IV Fluids), 250 mL, Intravenous, Once, Sindy Guadeloupe, MD .  acetaminophen (TYLENOL) tablet 650 mg, 650 mg, Oral, Once, Sindy Guadeloupe, MD .  furosemide (LASIX) injection 20 mg, 20 mg, Intravenous, Once, Sindy Guadeloupe, MD .  heparin lock flush 100 unit/mL, 500 Units, Intravenous, Once, Sindy Guadeloupe, MD .  sodium chloride flush (NS) 0.9 % injection 10 mL, 10 mL, Intravenous, PRN, Sindy Guadeloupe, MD, 10 mL at 03/22/18 0859  Physical exam:  Vitals:   03/22/18 0920  BP: 137/75  Pulse: 82  Resp: 18  Temp: (!) 97.3 F (36.3 C)  TempSrc: Tympanic  Weight: 231 lb (104.8 kg)  Height: 5' 10"  (1.778 m)   Physical Exam    Constitutional: He is oriented to person, place, and time.  Elderly gentleman who appears fatigued.  HENT:  Head: Normocephalic and atraumatic.  Eyes: Pupils are equal, round, and reactive to light. EOM are normal.  Neck: Normal range of motion.  Cardiovascular: Normal rate, regular rhythm and normal heart sounds.  Pulmonary/Chest: Effort normal and breath sounds normal.  Abdominal: Soft. Bowel sounds are normal.  Musculoskeletal: He exhibits edema.  Neurological: He is alert and oriented to person, place, and time.  Skin: Skin is warm and dry.     CMP Latest Ref Rng & Units 03/22/2018  Glucose 70 - 99 mg/dL 128(H)  BUN 8 - 23 mg/dL 16  Creatinine 0.61 - 1.24 mg/dL 0.80  Sodium 135 - 145 mmol/L 138  Potassium 3.5 - 5.1 mmol/L 3.9  Chloride 98 - 111 mmol/L 106  CO2 22 - 32 mmol/L 26  Calcium 8.9 - 10.3 mg/dL 7.9(L)  Total Protein 6.5 - 8.1 g/dL 6.1(L)  Total Bilirubin 0.3 - 1.2 mg/dL 1.0  Alkaline Phos 38 - 126 U/L 743(H)  AST 15 - 41 U/L 36  ALT 0 - 44 U/L 18   CBC Latest Ref Rng & Units 03/22/2018  WBC 4.0 - 10.5 K/uL 4.9  Hemoglobin 13.0 - 17.0 g/dL 5.7(L)  Hematocrit 39.0 - 52.0 % 17.9(L)  Platelets 150 - 400 K/uL 50(L)    No images are attached to the encounter.  Ct Chest W Contrast  Result Date: 03/18/2018 CLINICAL DATA:  Prostate carcinoma with bone metastasis. Metastatic high-grade neuroendocrine tumor. EXAM: CT CHEST, ABDOMEN, AND PELVIS WITH CONTRAST TECHNIQUE: Multidetector CT imaging of the chest, abdomen and pelvis was performed following the standard protocol during bolus administration of intravenous contrast. CONTRAST:  12m ISOVUE-300 IOPAMIDOL (ISOVUE-300) INJECTION 61% COMPARISON:  12/11/2017 FINDINGS: CT CHEST FINDINGS Cardiovascular: No significant vascular findings. Normal heart size. No pericardial effusion. Port in the anterior chest wall with tip in distal SVC. Mediastinum/Nodes: No axillary or supraclavicular adenopathy. Enlarged prevascular lymph  nodes similar prior measuring 11 mm. No new mediastinal adenopathy. Lungs/Pleura: Bilateral pulmonary nodules are not increased in size and number. Example: Nodular consolidation in the RIGHT middle lobe measuring 17 mm (  image 39/2) is new. Nodular lesion in the LEFT upper lobe measuring 12 mm (image 15/2) is new New lesion in the LEFT lower lobe measuring 6 mm on image 33/2. There is increase in bilateral pleural effusions which are larger on the RIGHT and small on LEFT. Musculoskeletal: No aggressive osseous lesion. CT ABDOMEN AND PELVIS FINDINGS Hepatobiliary: Multiple round metastatic lesions within LEFT RIGHT hepatic lobe similar to comparison PET-CT scan. Example lesion in the lateral aspect of the LEFT hepatic lobe measures 7.2 cm increased from 5.3 cm. Lesion in the caudate measures 3.7 cm compared to 3.6 cm. Pancreas: Pancreas is normal. No ductal dilatation. No pancreatic inflammation. Spleen: Normal spleen Adrenals/urinary tract: Adrenal glands and kidneys are normal. The ureters and bladder normal. Stomach/Bowel: Stomach, small bowel, appendix, and cecum are normal. The colon and rectosigmoid colon are normal. Vascular/Lymphatic: Abdominal aorta is normal caliber with atherosclerotic calcification. There is no retroperitoneal or periportal lymphadenopathy. No pelvic lymphadenopathy. Reproductive: Post prostatectomy Other: No peritoneal metastasis. Musculoskeletal: Diffuse bone sclerosis unchanged from comparison exam. This sclerosis sclerotic metastasis or fluid throughout the skeleton IMPRESSION: Chest Impression: 1. Increase in bilateral pulmonary nodules most consistent with disease progression of prostate carcinoma. Pseudo progression of immunotherapy is less favored. 2. Stable mediastinal adenopathy. 3. Interval increase in bilateral pleural effusions. Abdomen / Pelvis Impression: 1. No significant change in multiple hepatic metastasis. 2. No clear disease progression in the abdomen pelvis. 3.  Confluent sclerotic metastasis within entire skeleton. Electronically Signed   By: Suzy Bouchard M.D.   On: 03/18/2018 15:05   Ct Abdomen Pelvis W Contrast  Result Date: 03/18/2018 CLINICAL DATA:  Prostate carcinoma with bone metastasis. Metastatic high-grade neuroendocrine tumor. EXAM: CT CHEST, ABDOMEN, AND PELVIS WITH CONTRAST TECHNIQUE: Multidetector CT imaging of the chest, abdomen and pelvis was performed following the standard protocol during bolus administration of intravenous contrast. CONTRAST:  115m ISOVUE-300 IOPAMIDOL (ISOVUE-300) INJECTION 61% COMPARISON:  12/11/2017 FINDINGS: CT CHEST FINDINGS Cardiovascular: No significant vascular findings. Normal heart size. No pericardial effusion. Port in the anterior chest wall with tip in distal SVC. Mediastinum/Nodes: No axillary or supraclavicular adenopathy. Enlarged prevascular lymph nodes similar prior measuring 11 mm. No new mediastinal adenopathy. Lungs/Pleura: Bilateral pulmonary nodules are not increased in size and number. Example: Nodular consolidation in the RIGHT middle lobe measuring 17 mm (image 39/2) is new. Nodular lesion in the LEFT upper lobe measuring 12 mm (image 15/2) is new New lesion in the LEFT lower lobe measuring 6 mm on image 33/2. There is increase in bilateral pleural effusions which are larger on the RIGHT and small on LEFT. Musculoskeletal: No aggressive osseous lesion. CT ABDOMEN AND PELVIS FINDINGS Hepatobiliary: Multiple round metastatic lesions within LEFT RIGHT hepatic lobe similar to comparison PET-CT scan. Example lesion in the lateral aspect of the LEFT hepatic lobe measures 7.2 cm increased from 5.3 cm. Lesion in the caudate measures 3.7 cm compared to 3.6 cm. Pancreas: Pancreas is normal. No ductal dilatation. No pancreatic inflammation. Spleen: Normal spleen Adrenals/urinary tract: Adrenal glands and kidneys are normal. The ureters and bladder normal. Stomach/Bowel: Stomach, small bowel, appendix, and cecum are  normal. The colon and rectosigmoid colon are normal. Vascular/Lymphatic: Abdominal aorta is normal caliber with atherosclerotic calcification. There is no retroperitoneal or periportal lymphadenopathy. No pelvic lymphadenopathy. Reproductive: Post prostatectomy Other: No peritoneal metastasis. Musculoskeletal: Diffuse bone sclerosis unchanged from comparison exam. This sclerosis sclerotic metastasis or fluid throughout the skeleton IMPRESSION: Chest Impression: 1. Increase in bilateral pulmonary nodules most consistent with disease progression of prostate  carcinoma. Pseudo progression of immunotherapy is less favored. 2. Stable mediastinal adenopathy. 3. Interval increase in bilateral pleural effusions. Abdomen / Pelvis Impression: 1. No significant change in multiple hepatic metastasis. 2. No clear disease progression in the abdomen pelvis. 3. Confluent sclerotic metastasis within entire skeleton. Electronically Signed   By: Suzy Bouchard M.D.   On: 03/18/2018 15:05     Assessment and plan- Patient is a 72 y.o. 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 Mets to bone , liver, LN and skin and lungs.  He is here to discuss results of CT scan and further management  I have reviewed CT chest abdomen pelvis images independently and discussed findings with the patient.  CT shows progression of lung metastases.  Liver lesions are stable.  He continues to have significant pancytopenia mainly anemia requiring blood transfusion every 1 to 2 weeks.  He has received 3 cycles of Opdivo so far but has disease progression despite that.  He is not a candidate for chemotherapy given his performance status and ongoing cytopenias.  I discussed that pursuing home-based hospice would be the most reasonable plan for him at this time.  Aim of hospice would be to keep him comfortable at home without resorting to back and forth hospitalizations and focusing on  his symptoms and quality of life.  We will give him 2 units of blood transfusion today but we will not be checking his blood work subsequently if he decides to pursue hospice.  I will also have NP Vonna Kotyk borders discuss further goals of care and facilitate transition to hospice at this time.  Patient lives alone and does not have any family.  I anticipate that his health will continue to decline and he would need to be transferred to hospice home in the near future.  Patient verbalized understanding.   Visit Diagnosis 1. Symptomatic anemia   2. Prostate cancer metastatic to bone ([place])   3. Goals of care, counseling/discussion      Dr. Randa Evens, MD, MPH [facility] at [facility] [phone] 03/22/2018 12:26 PM

## 2018-03-22 NOTE — Progress Notes (Signed)
Michael Mathews  Telephone:([phone] [phone]   Name: Michael Mathews Date: 03/22/2018 MRN: [id number]  DOB: 29-Jul-1945  Patient Care Team: [name], MD as PCP - General (Family Medicine) [name], MD as Consulting Physician (Internal Medicine)    REASON FOR CONSULTATION: Palliative Care consult requested for this 72 y.o. 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  Patient was previously treated with Zytiga, Lupron, Xofigo, and Opdivo, but has had disease progression despite treatment.  Patient has had progressive disease most recently noted on CT of the chest/abdomen/pelvis on 03/18/2018, which revealed increased bilateral pulmonary nodules, stable mediastinal adenopathy, and interval increased bilateral pleural effusions, without significant change in his hepatic, pelvic, or skeletal disease.  Patient has had declining performance status complicated by a lack of social support in the home.  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  Patient was last transfused on 9/27 with 2 units of packed red blood cells for hemoglobin of 5.6.  Hemoglobin increased to 8.0 on 10/2 and has decreased again to 5.7 today.  Palliative care was asked to help establish treatment goals.  SOCIAL HISTORY:    Patient lives at at home alone in a trailer.  He describes relative isolation without family involvement.  Both parents are deceased.  He was never married and has no children.  He has a sister from whom he is estranged and has not talked to him in years.  He has several neighbors including Michael Mathews and herby (he does not know a last  name) who are somewhat involved.  Patient is originally from [place] but also lived in [place] prior to moving to [place].  Patient worked in [employer].  ADVANCE DIRECTIVES:  Does not have.  CODE STATUS: DNR  PAST MEDICAL HISTORY: Past Medical History:  Diagnosis Date  . Anemia   . Arthritis   . COPD (chronic obstructive pulmonary disease) ([place])   . Coronary artery disease   . Cough    chronic  . Diabetes mellitus without complication ([place])   . Edema   . Elevated PSA   . Headache   . Hypertension   . Orthopnea   . Oxygen deficit    o2 prn  . Prostate cancer ([place])   . Shortness of breath dyspnea   . Sleep apnea    cpap    PAST SURGICAL HISTORY:  Past Surgical History:  Procedure Laterality Date  . CARDIAC CATHETERIZATION  2014  . COLONOSCOPY    . CYSTOSCOPY W/ RETROGRADES Bilateral 10/18/2016   Procedure: CYSTOSCOPY WITH RETROGRADE PYELOGRAM;  Surgeon: Hollice Espy, MD;  Location: [facility];  Service: Urology;  Laterality: Bilateral;  . EYE SURGERY    . KNEE ARTHROSCOPY    . PORTA CATH INSERTION N/A 02/13/2018   Procedure: PORTA CATH INSERTION;  Surgeon: Katha Cabal, MD;  Location: [facility];  Service: Cardiovascular;  Laterality: N/A;  . TRANSURETHRAL RESECTION OF BLADDER TUMOR N/A 10/18/2016   Procedure: TRANSURETHRAL RESECTION OF BLADDER TUMOR (TURBT);  Surgeon: Hollice Espy, MD;  Location: [facility];  Service: Urology;  Laterality: N/A;  . TRANSURETHRAL RESECTION OF PROSTATE N/A 10/18/2016   Procedure: TRANSURETHRAL RESECTION OF THE PROSTATE (TURP) CHANNEL TURP;  Surgeon: Hollice Espy,  MD;  Location: [facility];  Service: Urology;  Laterality: N/A;    HEMATOLOGY/ONCOLOGY HISTORY:  Oncology History   . patient is 72 year old male with a past medical history significant for hypertension diabetes and COPD among other medical problems. He was admitted to the hospital in October 2017 with some symptoms of dizziness and abdominal pain. CT  angiogram abdomen incidentally showed sclerotic metastatic disease throughout the visualized spine.   2. This was followed by an MRI of the lumbar spine which showed multifocal T1 and T2 hypointense lesions with areas of increased signal on STIR sequence are seen in multiple vertebral bodies. Largest lesions are in T11 T12 L1 and in the imaged sacrum. This is consistent with metastatic disease. Primary lesion is not identified. MRI brain showed no acute intracranial abnormality.   3. Patient was noted to have an elevated PSA and was referred to urology as an outpatient. He was recently seen by Dr. Tresa Moore on 05/29/2016. PSA was repeated at that time which came back elevated at 10.3. 3 months over the value was 10.81.  4. Bone scan on 07/07/16 showed : Widespread radiotracer uptake over the axial and appendicular skeleton as described compatible with metastatic disease and correlating with sclerotic lesions on Ct. CT chest on 07/07/16 showed: Scattered sclerotic osseous metastatic lesions throughout spine, pelvis, proximal femora, ribs, and manubrium. Prostatic enlargement with thickened bladder wall question related to chronic bladder outlet obstruction.  Spiculated 12 x 11 x 7 mm LEFT upper lobe nodule question primary pulmonary neoplasm. Stable nonspecific small LEFT adrenal nodule. Single minimally enlarged AP window lymph node. BILATERAL inguinal hernias containing fat. 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 Recommend annual imaging followup by CTA or MRA.   5. Anemia work up from 06/20/16 was as follows: CBC showed white count of 5.2, H&H of 8.1/25 and a platelet count of 164. CMP showed elevated alkaline phosphatase of 391. Multiple myeloma panel showed normal quantitative immunoglobulins and no monoclonal M protein.Vitamin B12 level was low normal at 225. LDH was mildly elevated at 234.  Ferritin was low normal at 27. Reticulocyte count was 2.4% inappropriately low for the degree of anemia. Haptoglobin was elevated at 273.  6. Patients case was discussed at tumor board and plan was to watch the lung nodule with scans as it was in a difficult area to biopsy. CT guided bone biopsy was obtained which showed metastatic prostate cancer for which he is on lupron  7. Given that he does not have visceral or lymph node metastasis, docetaxel/zytigawas not started. Also patient lives alone and does not have a good social support or means of communication. Those options to be consideredat progression.   8. Bone scan on 12/22/2016 showed significantly worsening osseous metastatic disease. Left upper lobe nodule was slightly smaller in size. Zytiga was added in setting of worsening bone disease along with prednisonein aug 2018. Lupron administered 02/13/2017.  9. Scans after 3 months of zytiga showed progression of bone mets. PSA remains under control. Case discussed at tumor board and plan was to hold zytiga and proceed with [facility] for 6 months followed by repeat scans. xofigo started in jan 2019. Patientreceived 6 doses of xofigo. Last dose on 11/21/17  10. He was then found to have 2 new skin lesions- one showed SCC. 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 PET/CT scan on 12/11/17 showed:1. Examination is positive for extensive FDG avid,  hypermetabolic liver metastases. 2. Widespread hypermetabolic sclerotic bone metastases. 3. Increased uptake within soft tissue in the prostate bed compatible with residual/recurrent hypermetabolic local tumor. 4. Hypermetabolic left-sided mediastinal and right hilar lymph nodes. Suspicious for nodal metastasis. 5. Aortic atherosclerosis and coronary artery atherosclerotic calcifications. Aortic Atherosclerosis (ICD10-I70.0).  12. Prelim path shows high grade neuroendocrine carcinoma  positive for PSA suggestive of prostate primary        Prostate cancer metastatic to bone ([place])   07/28/2016 Initial Diagnosis    Prostate cancer metastatic to bone ([place])    01/11/2018 -  Chemotherapy    The patient had nivolumab (OPDIVO) 240 mg in sodium chloride 0.9 % 100 mL chemo infusion, 240 mg, Intravenous, Once, 3 of 6 cycles Administration: 240 mg (01/18/2018), 240 mg (02/22/2018), 240 mg (03/08/2018)  for chemotherapy treatment.      ALLERGIES:  has No Known Allergies.  MEDICATIONS:  Current Outpatient Medications  Medication Sig Dispense Refill  . amiodarone (PACERONE) 200 MG tablet Take 1 tablet (200 mg total) by mouth 2 (two) times daily. 60 tablet 0  . amLODipine (NORVASC) 10 MG tablet Take 1 tablet (10 mg total) by mouth daily. 30 tablet 0  . DULoxetine (CYMBALTA) 30 MG capsule Take 1 capsule (30 mg total) by mouth daily. 30 capsule 3  . finasteride (PROSCAR) 5 MG tablet Take 5 mg by mouth daily.    . Fluticasone-Salmeterol (ADVAIR) 250-50 MCG/DOSE AEPB Inhale 1 puff into the lungs 2 (two) times daily.    Marland Kitchen ketoconazole (NIZORAL) 2 % cream Apply 1 application topically daily as needed.     . lidocaine-prilocaine (EMLA) cream Apply to affected area once (Patient not taking: Reported on 01/14/2018) 30 g 3  . metoprolol tartrate (LOPRESSOR) 100 MG tablet Take 1 tablet (100 mg total) by mouth 2 (two) times daily. 60 tablet 0  . pantoprazole (PROTONIX) 40 MG tablet Take 1 tablet (40 mg total) by mouth daily. (Patient not taking: Reported on 02/13/2018) 30 tablet 0  . predniSONE (STERAPRED UNI-PAK 21 TAB) 10 MG (21) TBPK tablet Take 6 tabs first day, 5 tab on day 2, then 4 on day 3rd, 3 tabs on day 4th , 2 tab on day 5th, and 1 tab on 6th day. 21 tablet 0  . tamsulosin (FLOMAX) 0.4 MG CAPS capsule Take 1 capsule (0.4 mg total) by mouth daily. 90 capsule 3  . vitamin B-12 (CYANOCOBALAMIN) 1000 MCG tablet Take 1 tablet (1,000 mcg total) by mouth daily. 30 tablet 3   No current  facility-administered medications for this visit.    Facility-Administered Medications Ordered in Other Visits  Medication Dose Route Frequency Provider Last Rate Last Dose  . heparin lock flush 100 unit/mL  500 Units Intravenous Once Sindy Guadeloupe, MD      . sodium chloride flush (NS) 0.9 % injection 10 mL  10 mL Intravenous PRN Sindy Guadeloupe, MD   10 mL at 03/22/18 0859    VITAL SIGNS: There were no vitals taken for this visit. There were no vitals filed for this visit.  Estimated body mass index is 33.15 kg/m as calculated from the following:   Height as of an earlier encounter on 03/22/18: _0  (1.778 m).   Weight as of an earlier encounter on 03/22/18: 231 lb (104.8 kg).  LABS: CBC:    Component Value Date/Time   WBC 4.9 03/22/2018 0905   HGB 5.7 (L) 03/22/2018 0905   HCT 17.9 (L) 03/22/2018 0905   PLT 50 (L)  03/22/2018 0905   MCV 92.7 03/22/2018 0905   NEUTROABS 3.2 03/22/2018 0905   LYMPHSABS 0.4 (L) 03/22/2018 0905   MONOABS 0.7 03/22/2018 0905   EOSABS 0.1 03/22/2018 0905   BASOSABS 0.0 03/22/2018 0905   Comprehensive Metabolic Panel:    Component Value Date/Time   NA 138 03/22/2018 0905   NA 140 11/28/2013 1159   K 3.9 03/22/2018 0905   K 3.7 11/28/2013 1159   CL 106 03/22/2018 0905   CL 107 11/28/2013 1159   CO2 26 03/22/2018 0905   CO2 27 11/28/2013 1159   BUN 16 03/22/2018 0905   BUN 16 11/28/2013 1159   CREATININE 0.80 03/22/2018 0905   CREATININE 1.34 (H) 11/28/2013 1159   GLUCOSE 128 (H) 03/22/2018 0905   GLUCOSE 94 11/28/2013 1159   CALCIUM 7.9 (L) 03/22/2018 0905   CALCIUM 8.6 11/28/2013 1159   AST 36 03/22/2018 0905   ALT 18 03/22/2018 0905   ALKPHOS 743 (H) 03/22/2018 0905   BILITOT 1.0 03/22/2018 0905   PROT 6.1 (L) 03/22/2018 0905   ALBUMIN 2.8 (L) 03/22/2018 0905    RADIOGRAPHIC STUDIES: Ct Chest W Contrast  Result Date: 03/18/2018 CLINICAL DATA:  Prostate carcinoma with bone metastasis. Metastatic high-grade neuroendocrine  tumor. EXAM: CT CHEST, ABDOMEN, AND PELVIS WITH CONTRAST TECHNIQUE: Multidetector CT imaging of the chest, abdomen and pelvis was performed following the standard protocol during bolus administration of intravenous contrast. CONTRAST:  174m ISOVUE-300 IOPAMIDOL (ISOVUE-300) INJECTION 61% COMPARISON:  12/11/2017 FINDINGS: CT CHEST FINDINGS Cardiovascular: No significant vascular findings. Normal heart size. No pericardial effusion. Port in the anterior chest wall with tip in distal SVC. Mediastinum/Nodes: No axillary or supraclavicular adenopathy. Enlarged prevascular lymph nodes similar prior measuring 11 mm. No new mediastinal adenopathy. Lungs/Pleura: Bilateral pulmonary nodules are not increased in size and number. Example: Nodular consolidation in the RIGHT middle lobe measuring 17 mm (image 39/2) is new. Nodular lesion in the LEFT upper lobe measuring 12 mm (image 15/2) is new New lesion in the LEFT lower lobe measuring 6 mm on image 33/2. There is increase in bilateral pleural effusions which are larger on the RIGHT and small on LEFT. Musculoskeletal: No aggressive osseous lesion. CT ABDOMEN AND PELVIS FINDINGS Hepatobiliary: Multiple round metastatic lesions within LEFT RIGHT hepatic lobe similar to comparison PET-CT scan. Example lesion in the lateral aspect of the LEFT hepatic lobe measures 7.2 cm increased from 5.3 cm. Lesion in the caudate measures 3.7 cm compared to 3.6 cm. Pancreas: Pancreas is normal. No ductal dilatation. No pancreatic inflammation. Spleen: Normal spleen Adrenals/urinary tract: Adrenal glands and kidneys are normal. The ureters and bladder normal. Stomach/Bowel: Stomach, small bowel, appendix, and cecum are normal. The colon and rectosigmoid colon are normal. Vascular/Lymphatic: Abdominal aorta is normal caliber with atherosclerotic calcification. There is no retroperitoneal or periportal lymphadenopathy. No pelvic lymphadenopathy. Reproductive: Post prostatectomy Other: No  peritoneal metastasis. Musculoskeletal: Diffuse bone sclerosis unchanged from comparison exam. This sclerosis sclerotic metastasis or fluid throughout the skeleton IMPRESSION: Chest Impression: 1. Increase in bilateral pulmonary nodules most consistent with disease progression of prostate carcinoma. Pseudo progression of immunotherapy is less favored. 2. Stable mediastinal adenopathy. 3. Interval increase in bilateral pleural effusions. Abdomen / Pelvis Impression: 1. No significant change in multiple hepatic metastasis. 2. No clear disease progression in the abdomen pelvis. 3. Confluent sclerotic metastasis within entire skeleton. Electronically Signed   By: SSuzy BouchardM.D.   On: 03/18/2018 15:05   Ct Abdomen Pelvis W Contrast  Result Date: 03/18/2018 CLINICAL DATA:  Prostate carcinoma with bone metastasis. Metastatic high-grade neuroendocrine tumor. EXAM: CT CHEST, ABDOMEN, AND PELVIS WITH CONTRAST TECHNIQUE: Multidetector CT imaging of the chest, abdomen and pelvis was performed following the standard protocol during bolus administration of intravenous contrast. CONTRAST:  183m ISOVUE-300 IOPAMIDOL (ISOVUE-300) INJECTION 61% COMPARISON:  12/11/2017 FINDINGS: CT CHEST FINDINGS Cardiovascular: No significant vascular findings. Normal heart size. No pericardial effusion. Port in the anterior chest wall with tip in distal SVC. Mediastinum/Nodes: No axillary or supraclavicular adenopathy. Enlarged prevascular lymph nodes similar prior measuring 11 mm. No new mediastinal adenopathy. Lungs/Pleura: Bilateral pulmonary nodules are not increased in size and number. Example: Nodular consolidation in the RIGHT middle lobe measuring 17 mm (image 39/2) is new. Nodular lesion in the LEFT upper lobe measuring 12 mm (image 15/2) is new New lesion in the LEFT lower lobe measuring 6 mm on image 33/2. There is increase in bilateral pleural effusions which are larger on the RIGHT and small on LEFT. Musculoskeletal: No  aggressive osseous lesion. CT ABDOMEN AND PELVIS FINDINGS Hepatobiliary: Multiple round metastatic lesions within LEFT RIGHT hepatic lobe similar to comparison PET-CT scan. Example lesion in the lateral aspect of the LEFT hepatic lobe measures 7.2 cm increased from 5.3 cm. Lesion in the caudate measures 3.7 cm compared to 3.6 cm. Pancreas: Pancreas is normal. No ductal dilatation. No pancreatic inflammation. Spleen: Normal spleen Adrenals/urinary tract: Adrenal glands and kidneys are normal. The ureters and bladder normal. Stomach/Bowel: Stomach, small bowel, appendix, and cecum are normal. The colon and rectosigmoid colon are normal. Vascular/Lymphatic: Abdominal aorta is normal caliber with atherosclerotic calcification. There is no retroperitoneal or periportal lymphadenopathy. No pelvic lymphadenopathy. Reproductive: Post prostatectomy Other: No peritoneal metastasis. Musculoskeletal: Diffuse bone sclerosis unchanged from comparison exam. This sclerosis sclerotic metastasis or fluid throughout the skeleton IMPRESSION: Chest Impression: 1. Increase in bilateral pulmonary nodules most consistent with disease progression of prostate carcinoma. Pseudo progression of immunotherapy is less favored. 2. Stable mediastinal adenopathy. 3. Interval increase in bilateral pleural effusions. Abdomen / Pelvis Impression: 1. No significant change in multiple hepatic metastasis. 2. No clear disease progression in the abdomen pelvis. 3. Confluent sclerotic metastasis within entire skeleton. Electronically Signed   By: SSuzy BouchardM.D.   On: 03/18/2018 15:05    PERFORMANCE STATUS (ECOG) : 3 - Symptomatic, >50% confined to bed  Review of Systems As noted above. Otherwise, a complete review of systems is negative.  Physical Exam General: NAD, frail appearing, in wheelchair Cardiovascular: regular rate and rhythm Pulmonary: clear ant fields Abdomen: soft, nontender, + bowel sounds GU: no suprapubic  tenderness Extremities: Trace edema lower extremities, no joint deformities Skin: no rashes Neurological: Weakness but otherwise nonfocal  IMPRESSION: I met with patient today in the clinic.  Patient also saw Dr. RJanese Bankstoday and is able to relate to me their conversation.  Patient says he recognizes that he is declining and that there is no future plan for labs, transfusion, or oncologic treatment.  Patient speaks candidly about his death.  He says "I know I am going to kick the bucket".  Patient has been referred several times to hospice but has previously refused their services.  We discussed hospice today at length.  Patient seems to recognize that he will decline in the future and will be in need of healthcare services in the home.  Patient says that he will agree with hospice.  I called hospice and patient will be admitted to services tomorrow on 03/23/2018.  We discussed patient's end-of-life goals.  He desires  comfort measures.  Patient does not have advanced directives nor an established healthcare proxy.  He says that if necessary he would want his neighbor, Michael Mathews, to speak on his behalf.  It is not clear to me how involved Michael Mathews is with patient's care at this point, although, patient says that Michael Mathews is aware that he has cancer.   I am concerned that patient's social status and isolation, compounded by his lack of a phone, will make care difficult as he declines.  I discussed with patient the option of transfer to the hospice home for end-of-life care as he begins to decline.  He was familiar with the hospice home and was agreeable to transfer when needed.  Patient will need frequent hospice nursing visits to assess his status in the coming weeks.  Patient is currently asymptomatic other than generalized weakness.  He is pending transfusion today.  I reviewed patient's wishes for CODE STATUS.  He would not want to be resuscitated or have his life prolonged artificially.  I completed  a MOST form today. The patient outlined his wishes for the following treatment decisions:  Cardiopulmonary Resuscitation: Do Not Attempt Resuscitation (DNR/No CPR)  Medical Interventions: Comfort Measures: Keep clean, warm, and dry. Use medication by any route, positioning, wound care, and other measures to relieve pain and suffering. Use oxygen, suction and manual treatment of airway obstruction as needed for comfort. Do not transfer to the hospital unless comfort needs cannot be met in current location.  Antibiotics: No antibiotics (use other measures to relieve symptoms)  IV Fluids: No IV fluids (provide other measures to ensure comfort)  Feeding Tube: No feeding tube    PLAN: 1. Hospice referral with planned admission to hospice services this weekend 2. DNR 3. MOST form completed as outlined above (DNAR/Comfort measures only) 4. Transfusion today 5. Plan for transfer to the [facility] when patient declines  Patient expressed understanding and was in agreement with this plan. He also understands that He can call clinic at any time with any questions, concerns, or complaints.   Time Total: 45 minutes  Visit consisted of counseling and education dealing with the complex and emotionally intense issues of symptom management and palliative care in the setting of serious and potentially life-threatening illness.Greater than 50%  of this time was spent counseling and coordinating care related to the above assessment and plan.  Signed by: Altha Harm, [place], NP-C, [place] (Work Cell)

## 2018-03-22 NOTE — Telephone Encounter (Signed)
Send a hospice referral off today.  Got a call from Neoma Laming from hospice saying that patient is already have services with [facility].  They are unable to take the referral because of this.  I then called [facility] and talk to Aaron Edelman who did say that he is a patient with [facility].  Then I was transferred to nurse Claiborne Billings and let her know that as of today patient is a true hospice patient patient has progressed by scan.  Also we have no other options of any kind of chemo treatment to give patient.  Hemoglobin is 5.7 today and he did get 2 units of blood.  But as of today he will no longer get any more blood transfusions due to it is caused by his cancer and that he is electing hospice services because there is nothing further that can be given to him to help with his cancer diagnosis.  Patient is agreeable to this and also saw Merrily Pew the palliative care.  Patient knows that it will not be very long before he passes away due to the involvement of his bone marrow from the disease and will not get any more blood transfusions he is agreeable to hospice services as well is going to the hospice home.  Patient lives alone does not have family to rely on, does not have a phone.  When I was speaking to [facility] they actually were able to get him a phone so he does have a new phone number: it is [phone]. It was recommended that pt get to hospice home by end of next week. Claiborne Billings states they have been talking to him about what to do when he can't be at home and the lack of support makes it hard for him, they will go out and discuss with him his options and will contact local hospice homes to see if he can get in if that is what patient wants. I faxed the notes from today, the labs from today and his recent scan from last week that showed progression.

## 2018-03-22 NOTE — Telephone Encounter (Signed)
Called and spoke to hospice intake coordinator and let them know that I am faxing the referral. The patient had been evaluated before and at the time the patient thought he did not need their services. Josh from palliative care called after speaking to pt and he is willing to go to hospice services and he spoke to him about may need [facility] due to his blood counts dropping and it is from the cancer that is causing it. Patient has no phone, they can go over there and knock on door and then if he does not answer just open door and yell his name. He will hear you and come from his bedroom to his living room where the door is. They did confirm they got a call from [facility] and will try opening today

## 2018-03-24 LAB — BPAM RBC
BLOOD PRODUCT EXPIRATION DATE: 201911052359
BLOOD PRODUCT EXPIRATION DATE: 201911052359
ISSUE DATE / TIME: 201910111317
ISSUE DATE / TIME: 201910111505
UNIT TYPE AND RH: 6200
Unit Type and Rh: 6200

## 2018-03-24 LAB — TYPE AND SCREEN
ABO/RH(D): A POS
ANTIBODY SCREEN: NEGATIVE
UNIT DIVISION: 0
Unit division: 0

## 2018-04-04 ENCOUNTER — Ambulatory Visit (HOSPITAL_BASED_OUTPATIENT_CLINIC_OR_DEPARTMENT_OTHER): Admitting: Hospice and Palliative Medicine

## 2018-04-04 DIAGNOSIS — C61 Malignant neoplasm of prostate: Secondary | ICD-10-CM

## 2018-04-04 DIAGNOSIS — R531 Weakness: Secondary | ICD-10-CM

## 2018-04-04 DIAGNOSIS — R530 Neoplastic (malignant) related fatigue: Secondary | ICD-10-CM

## 2018-04-04 DIAGNOSIS — C7951 Secondary malignant neoplasm of bone: Secondary | ICD-10-CM

## 2018-04-04 DIAGNOSIS — Z515 Encounter for palliative care: Secondary | ICD-10-CM

## 2018-04-04 NOTE — Progress Notes (Signed)
[facility]  Telephone:([phone] [phone]   Name: ESCO JOSLYN Date: 04/04/2018 MRN: [id number]  DOB: 02-11-1946  Patient Care Team: [name], MD as PCP - General (Family Medicine) [name], MD as Consulting Physician (Internal Medicine)    REASON FOR CONSULTATION: Palliative Care consult requested for this 72 y.o. 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  Patient was previously treated with Zytiga, Lupron, Xofigo, and Opdivo, but has had disease progression despite treatment.  Patient has had progressive disease most recently noted on CT of the chest/abdomen/pelvis on 03/18/2018, which revealed increased bilateral pulmonary nodules, stable mediastinal adenopathy, and interval increased bilateral pleural effusions, without significant change in his hepatic, pelvic, or skeletal disease.  Patient has had declining performance status complicated by a lack of social support in the home.  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  Patient was last transfused on 9/27 with 2 units of packed red blood cells for hemoglobin of 5.6.  Hemoglobin increased to 8.0 on 10/2 and has decreased again to 5.7 today.  Palliative care was asked to help establish treatment goals.  SOCIAL HISTORY:    Patient lives at at home alone in a trailer.  He describes relative isolation without family involvement.  Both parents are deceased.  He was never married and has no children.  He has a sister from whom he is estranged and has not talked to him in years.  He has several neighbors including Odette Fraction and herby (he does not know a last  name) who are somewhat involved.  Patient is originally from [place] but also lived in [place] prior to moving to [place].  Patient worked in [employer].  ADVANCE DIRECTIVES:  Does not have.  CODE STATUS: DNR  PAST MEDICAL HISTORY: Past Medical History:  Diagnosis Date  . Anemia   . Arthritis   . COPD (chronic obstructive pulmonary disease) ([place])   . Coronary artery disease   . Cough    chronic  . Diabetes mellitus without complication ([place])   . Edema   . Elevated PSA   . Headache   . Hypertension   . Orthopnea   . Oxygen deficit    o2 prn  . Prostate cancer ([place])   . Shortness of breath dyspnea   . Sleep apnea    cpap    PAST SURGICAL HISTORY:  Past Surgical History:  Procedure Laterality Date  . CARDIAC CATHETERIZATION  2014  . COLONOSCOPY    . CYSTOSCOPY W/ RETROGRADES Bilateral 10/18/2016   Procedure: CYSTOSCOPY WITH RETROGRADE PYELOGRAM;  Surgeon: Hollice Espy, MD;  Location: [facility];  Service: Urology;  Laterality: Bilateral;  . EYE SURGERY    . KNEE ARTHROSCOPY    . PORTA CATH INSERTION N/A 02/13/2018   Procedure: PORTA CATH INSERTION;  Surgeon: Katha Cabal, MD;  Location: [facility];  Service: Cardiovascular;  Laterality: N/A;  . TRANSURETHRAL RESECTION OF BLADDER TUMOR N/A 10/18/2016   Procedure: TRANSURETHRAL RESECTION OF BLADDER TUMOR (TURBT);  Surgeon: Hollice Espy, MD;  Location: [facility];  Service: Urology;  Laterality: N/A;  . TRANSURETHRAL RESECTION OF PROSTATE N/A 10/18/2016   Procedure: TRANSURETHRAL RESECTION OF THE PROSTATE (TURP) CHANNEL TURP;  Surgeon: Hollice Espy,  MD;  Location: [facility];  Service: Urology;  Laterality: N/A;    HEMATOLOGY/ONCOLOGY HISTORY:  Oncology History   . patient is 72 year old male with a past medical history significant for hypertension diabetes and COPD among other medical problems. He was admitted to the hospital in October 2017 with some symptoms of dizziness and abdominal pain. CT  angiogram abdomen incidentally showed sclerotic metastatic disease throughout the visualized spine.   2. This was followed by an MRI of the lumbar spine which showed multifocal T1 and T2 hypointense lesions with areas of increased signal on STIR sequence are seen in multiple vertebral bodies. Largest lesions are in T11 T12 L1 and in the imaged sacrum. This is consistent with metastatic disease. Primary lesion is not identified. MRI brain showed no acute intracranial abnormality.   3. Patient was noted to have an elevated PSA and was referred to urology as an outpatient. He was recently seen by Dr. Tresa Moore on 05/29/2016. PSA was repeated at that time which came back elevated at 10.3. 3 months over the value was 10.81.  4. Bone scan on 07/07/16 showed : Widespread radiotracer uptake over the axial and appendicular skeleton as described compatible with metastatic disease and correlating with sclerotic lesions on Ct. CT chest on 07/07/16 showed: Scattered sclerotic osseous metastatic lesions throughout spine, pelvis, proximal femora, ribs, and manubrium. Prostatic enlargement with thickened bladder wall question related to chronic bladder outlet obstruction.  Spiculated 12 x 11 x 7 mm LEFT upper lobe nodule question primary pulmonary neoplasm. Stable nonspecific small LEFT adrenal nodule. Single minimally enlarged AP window lymph node. BILATERAL inguinal hernias containing fat. 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 Recommend annual imaging followup by CTA or MRA.   5. Anemia work up from 06/20/16 was as follows: CBC showed white count of 5.2, H&H of 8.1/25 and a platelet count of 164. CMP showed elevated alkaline phosphatase of 391. Multiple myeloma panel showed normal quantitative immunoglobulins and no monoclonal M protein.Vitamin B12 level was low normal at 225. LDH was mildly elevated at 234.  Ferritin was low normal at 27. Reticulocyte count was 2.4% inappropriately low for the degree of anemia. Haptoglobin was elevated at 273.  6. Patients case was discussed at tumor board and plan was to watch the lung nodule with scans as it was in a difficult area to biopsy. CT guided bone biopsy was obtained which showed metastatic prostate cancer for which he is on lupron  7. Given that he does not have visceral or lymph node metastasis, docetaxel/zytigawas not started. Also patient lives alone and does not have a good social support or means of communication. Those options to be consideredat progression.   8. Bone scan on 12/22/2016 showed significantly worsening osseous metastatic disease. Left upper lobe nodule was slightly smaller in size. Zytiga was added in setting of worsening bone disease along with prednisonein aug 2018. Lupron administered 02/13/2017.  9. Scans after 3 months of zytiga showed progression of bone mets. PSA remains under control. Case discussed at tumor board and plan was to hold zytiga and proceed with [facility] for 6 months followed by repeat scans. xofigo started in jan 2019. Patientreceived 6 doses of xofigo. Last dose on 11/21/17  10. He was then found to have 2 new skin lesions- one showed SCC. 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 PET/CT scan on 12/11/17 showed:1. Examination is positive for extensive FDG avid,  hypermetabolic liver metastases. 2. Widespread hypermetabolic sclerotic bone metastases. 3. Increased uptake within soft tissue in the prostate bed compatible with residual/recurrent hypermetabolic local tumor. 4. Hypermetabolic left-sided mediastinal and right hilar lymph nodes. Suspicious for nodal metastasis. 5. Aortic atherosclerosis and coronary artery atherosclerotic calcifications. Aortic Atherosclerosis (ICD10-I70.0).  12. Prelim path shows high grade neuroendocrine carcinoma  positive for PSA suggestive of prostate primary        Prostate cancer metastatic to bone ([place])   07/28/2016 Initial Diagnosis    Prostate cancer metastatic to bone ([place])    01/11/2018 -  Chemotherapy    The patient had nivolumab (OPDIVO) 240 mg in sodium chloride 0.9 % 100 mL chemo infusion, 240 mg, Intravenous, Once, 3 of 6 cycles Administration: 240 mg (01/18/2018), 240 mg (02/22/2018), 240 mg (03/08/2018)  for chemotherapy treatment.      ALLERGIES:  has No Known Allergies.  MEDICATIONS:  Current Outpatient Medications  Medication Sig Dispense Refill  . amiodarone (PACERONE) 200 MG tablet Take 1 tablet (200 mg total) by mouth 2 (two) times daily. 60 tablet 0  . amLODipine (NORVASC) 10 MG tablet Take 1 tablet (10 mg total) by mouth daily. 30 tablet 0  . DULoxetine (CYMBALTA) 30 MG capsule Take 1 capsule (30 mg total) by mouth daily. 30 capsule 3  . finasteride (PROSCAR) 5 MG tablet Take 5 mg by mouth daily.    . Fluticasone-Salmeterol (ADVAIR) 250-50 MCG/DOSE AEPB Inhale 1 puff into the lungs 2 (two) times daily.    Marland Kitchen ketoconazole (NIZORAL) 2 % cream Apply 1 application topically daily as needed.     . lidocaine-prilocaine (EMLA) cream Apply to affected area once (Patient not taking: Reported on 01/14/2018) 30 g 3  . metoprolol tartrate (LOPRESSOR) 100 MG tablet Take 1 tablet (100 mg total) by mouth 2 (two) times daily. 60 tablet 0  . pantoprazole (PROTONIX) 40 MG tablet Take 1 tablet (40 mg total) by mouth daily. (Patient not taking: Reported on 02/13/2018) 30 tablet 0  . predniSONE (STERAPRED UNI-PAK 21 TAB) 10 MG (21) TBPK tablet Take 6 tabs first day, 5 tab on day 2, then 4 on day 3rd, 3 tabs on day 4th , 2 tab on day 5th, and 1 tab on 6th day. 21 tablet 0  . tamsulosin (FLOMAX) 0.4 MG CAPS capsule Take 1 capsule (0.4 mg total) by mouth daily. 90 capsule 3  . vitamin B-12 (CYANOCOBALAMIN) 1000 MCG tablet Take 1 tablet (1,000 mcg total) by mouth daily. 30 tablet 3   No current  facility-administered medications for this visit.     VITAL SIGNS: There were no vitals taken for this visit. There were no vitals filed for this visit.  Estimated body mass index is 33.15 kg/m as calculated from the following:   Height as of 03/22/18: _0  (1.778 m).   Weight as of 03/22/18: 231 lb (104.8 kg).  LABS: CBC:    Component Value Date/Time   WBC 4.9 03/22/2018 0905   HGB 5.7 (L) 03/22/2018 0905   HCT 17.9 (L) 03/22/2018 0905   PLT 50 (L) 03/22/2018 0905   MCV 92.7 03/22/2018 0905   NEUTROABS 3.2 03/22/2018 0905   LYMPHSABS 0.4 (L) 03/22/2018 0905   MONOABS 0.7 03/22/2018 0905   EOSABS 0.1 03/22/2018 0905   BASOSABS 0.0 03/22/2018 0905   Comprehensive Metabolic Panel:    Component Value Date/Time   NA 138 03/22/2018 0905   NA 140 11/28/2013 1159   K 3.9 03/22/2018 0905   K 3.7 11/28/2013  1159   CL 106 03/22/2018 0905   CL 107 11/28/2013 1159   CO2 26 03/22/2018 0905   CO2 27 11/28/2013 1159   BUN 16 03/22/2018 0905   BUN 16 11/28/2013 1159   CREATININE 0.80 03/22/2018 0905   CREATININE 1.34 (H) 11/28/2013 1159   GLUCOSE 128 (H) 03/22/2018 0905   GLUCOSE 94 11/28/2013 1159   CALCIUM 7.9 (L) 03/22/2018 0905   CALCIUM 8.6 11/28/2013 1159   AST 36 03/22/2018 0905   ALT 18 03/22/2018 0905   ALKPHOS 743 (H) 03/22/2018 0905   BILITOT 1.0 03/22/2018 0905   PROT 6.1 (L) 03/22/2018 0905   ALBUMIN 2.8 (L) 03/22/2018 0905    RADIOGRAPHIC STUDIES: Ct Chest W Contrast  Result Date: 03/18/2018 CLINICAL DATA:  Prostate carcinoma with bone metastasis. Metastatic high-grade neuroendocrine tumor. EXAM: CT CHEST, ABDOMEN, AND PELVIS WITH CONTRAST TECHNIQUE: Multidetector CT imaging of the chest, abdomen and pelvis was performed following the standard protocol during bolus administration of intravenous contrast. CONTRAST:  164m ISOVUE-300 IOPAMIDOL (ISOVUE-300) INJECTION 61% COMPARISON:  12/11/2017 FINDINGS: CT CHEST FINDINGS Cardiovascular: No significant vascular  findings. Normal heart size. No pericardial effusion. Port in the anterior chest wall with tip in distal SVC. Mediastinum/Nodes: No axillary or supraclavicular adenopathy. Enlarged prevascular lymph nodes similar prior measuring 11 mm. No new mediastinal adenopathy. Lungs/Pleura: Bilateral pulmonary nodules are not increased in size and number. Example: Nodular consolidation in the RIGHT middle lobe measuring 17 mm (image 39/2) is new. Nodular lesion in the LEFT upper lobe measuring 12 mm (image 15/2) is new New lesion in the LEFT lower lobe measuring 6 mm on image 33/2. There is increase in bilateral pleural effusions which are larger on the RIGHT and small on LEFT. Musculoskeletal: No aggressive osseous lesion. CT ABDOMEN AND PELVIS FINDINGS Hepatobiliary: Multiple round metastatic lesions within LEFT RIGHT hepatic lobe similar to comparison PET-CT scan. Example lesion in the lateral aspect of the LEFT hepatic lobe measures 7.2 cm increased from 5.3 cm. Lesion in the caudate measures 3.7 cm compared to 3.6 cm. Pancreas: Pancreas is normal. No ductal dilatation. No pancreatic inflammation. Spleen: Normal spleen Adrenals/urinary tract: Adrenal glands and kidneys are normal. The ureters and bladder normal. Stomach/Bowel: Stomach, small bowel, appendix, and cecum are normal. The colon and rectosigmoid colon are normal. Vascular/Lymphatic: Abdominal aorta is normal caliber with atherosclerotic calcification. There is no retroperitoneal or periportal lymphadenopathy. No pelvic lymphadenopathy. Reproductive: Post prostatectomy Other: No peritoneal metastasis. Musculoskeletal: Diffuse bone sclerosis unchanged from comparison exam. This sclerosis sclerotic metastasis or fluid throughout the skeleton IMPRESSION: Chest Impression: 1. Increase in bilateral pulmonary nodules most consistent with disease progression of prostate carcinoma. Pseudo progression of immunotherapy is less favored. 2. Stable mediastinal adenopathy. 3.  Interval increase in bilateral pleural effusions. Abdomen / Pelvis Impression: 1. No significant change in multiple hepatic metastasis. 2. No clear disease progression in the abdomen pelvis. 3. Confluent sclerotic metastasis within entire skeleton. Electronically Signed   By: SSuzy BouchardM.D.   On: 03/18/2018 15:05   Ct Abdomen Pelvis W Contrast  Result Date: 03/18/2018 CLINICAL DATA:  Prostate carcinoma with bone metastasis. Metastatic high-grade neuroendocrine tumor. EXAM: CT CHEST, ABDOMEN, AND PELVIS WITH CONTRAST TECHNIQUE: Multidetector CT imaging of the chest, abdomen and pelvis was performed following the standard protocol during bolus administration of intravenous contrast. CONTRAST:  1057mISOVUE-300 IOPAMIDOL (ISOVUE-300) INJECTION 61% COMPARISON:  12/11/2017 FINDINGS: CT CHEST FINDINGS Cardiovascular: No significant vascular findings. Normal heart size. No pericardial effusion. Port in the anterior chest wall with  tip in distal SVC. Mediastinum/Nodes: No axillary or supraclavicular adenopathy. Enlarged prevascular lymph nodes similar prior measuring 11 mm. No new mediastinal adenopathy. Lungs/Pleura: Bilateral pulmonary nodules are not increased in size and number. Example: Nodular consolidation in the RIGHT middle lobe measuring 17 mm (image 39/2) is new. Nodular lesion in the LEFT upper lobe measuring 12 mm (image 15/2) is new New lesion in the LEFT lower lobe measuring 6 mm on image 33/2. There is increase in bilateral pleural effusions which are larger on the RIGHT and small on LEFT. Musculoskeletal: No aggressive osseous lesion. CT ABDOMEN AND PELVIS FINDINGS Hepatobiliary: Multiple round metastatic lesions within LEFT RIGHT hepatic lobe similar to comparison PET-CT scan. Example lesion in the lateral aspect of the LEFT hepatic lobe measures 7.2 cm increased from 5.3 cm. Lesion in the caudate measures 3.7 cm compared to 3.6 cm. Pancreas: Pancreas is normal. No ductal dilatation. No  pancreatic inflammation. Spleen: Normal spleen Adrenals/urinary tract: Adrenal glands and kidneys are normal. The ureters and bladder normal. Stomach/Bowel: Stomach, small bowel, appendix, and cecum are normal. The colon and rectosigmoid colon are normal. Vascular/Lymphatic: Abdominal aorta is normal caliber with atherosclerotic calcification. There is no retroperitoneal or periportal lymphadenopathy. No pelvic lymphadenopathy. Reproductive: Post prostatectomy Other: No peritoneal metastasis. Musculoskeletal: Diffuse bone sclerosis unchanged from comparison exam. This sclerosis sclerotic metastasis or fluid throughout the skeleton IMPRESSION: Chest Impression: 1. Increase in bilateral pulmonary nodules most consistent with disease progression of prostate carcinoma. Pseudo progression of immunotherapy is less favored. 2. Stable mediastinal adenopathy. 3. Interval increase in bilateral pleural effusions. Abdomen / Pelvis Impression: 1. No significant change in multiple hepatic metastasis. 2. No clear disease progression in the abdomen pelvis. 3. Confluent sclerotic metastasis within entire skeleton. Electronically Signed   By: Suzy Bouchard M.D.   On: 03/18/2018 15:05    PERFORMANCE STATUS (ECOG) : 3-4  Review of Systems As noted above. Otherwise, a complete review of systems is negative.  Physical Exam General: frail appearing, in chair Pulmonary: unlabored, occasional productive cough Abdomen: distended Extremities: 3+ edema bilat lower exts to upper thigh Skin: no rashes Neurological: Weakness but otherwise nonfocal  IMPRESSION: Visit made to patient's home.  I was accompanied by Judeen Hammans, RN.  Patient has significantly declined since last clinic visit.  He has had increasing weakness with decreasing performance status.  He is no longer able to drive nor navigate his steps leading to his home.  He is ambulatory for short distances in the home with use of a walker.  However he has to frequently  stop and catch his breath.  Oral intake is poor.  Patient eating minimally throughout the day.  Today, he is only had some bites of Trail mix.  He also has new edema to the bilateral lower extremities, which reportedly has been worsening this week.  I suspect that patient has worsening anemia as he has not had a transfusion in nearly 2 weeks.  Patient is being followed by [facility].  They are coming later this afternoon to talk to him about his treatment plan.  Apparently, they have recommended transfer to assisted living facility in [place].  I do not think that patient would be clinically appropriate for an assisted living facility given his weakness and risk for rapid deterioration.  I think patient would be better served in an inpatient hospice facility.  These recommendations have been communicated with [facility] hosp[facility].  Patient's prognosis is extremely poor.  I expect that he will continue to decline in the next  days to weeks.  I am concerned that he is unable to care for himself in the home.  I am also concerned that patient will acutely decline and will be unable to reach assistance as he has difficulty with his phone.  Again, I think patient would be best served in an inpatient hospice facility.  PLAN: 1.  Hospice and comfort care 2.  Patient needs DNR form visible in the home 3.  I recommend transfer to inpatient hospice facility given decline  Patient expressed understanding and was in agreement with this plan. He also understands that He can call clinic at any time with any questions, concerns, or complaints.   Time Total: 45 minutes  Visit consisted of counseling and education dealing with the complex and emotionally intense issues of symptom management and palliative care in the setting of serious and potentially life-threatening illness.Greater than 50%  of this time was spent counseling and coordinating care related to the above assessment and plan.  Signed by: Altha Harm, [place], NP-C, [place] (Work Cell)

## 2018-04-09 ENCOUNTER — Telehealth: Payer: Self-pay | Admitting: Hospice and Palliative Medicine

## 2018-04-10 ENCOUNTER — Inpatient Hospital Stay (HOSPITAL_COMMUNITY)
Admission: EM | Admit: 2018-04-10 | Discharge: 2018-04-11 | DRG: 871 | Disposition: A | Discharge status: Hospice | Payer: Medicare Other | Attending: Internal Medicine | Admitting: Internal Medicine

## 2018-04-10 ENCOUNTER — Encounter (HOSPITAL_COMMUNITY): Payer: Self-pay | Admitting: Emergency Medicine

## 2018-04-10 ENCOUNTER — Other Ambulatory Visit: Payer: Self-pay

## 2018-04-10 ENCOUNTER — Emergency Department (HOSPITAL_COMMUNITY): Payer: Medicare Other

## 2018-04-10 DIAGNOSIS — G4733 Obstructive sleep apnea (adult) (pediatric): Secondary | ICD-10-CM | POA: Diagnosis present

## 2018-04-10 DIAGNOSIS — Z8673 Personal history of transient ischemic attack (TIA), and cerebral infarction without residual deficits: Secondary | ICD-10-CM | POA: Diagnosis not present

## 2018-04-10 DIAGNOSIS — E785 Hyperlipidemia, unspecified: Secondary | ICD-10-CM | POA: Diagnosis present

## 2018-04-10 DIAGNOSIS — Z515 Encounter for palliative care: Secondary | ICD-10-CM | POA: Diagnosis present

## 2018-04-10 DIAGNOSIS — Z823 Family history of stroke: Secondary | ICD-10-CM | POA: Diagnosis not present

## 2018-04-10 DIAGNOSIS — J189 Pneumonia, unspecified organism: Secondary | ICD-10-CM | POA: Diagnosis present

## 2018-04-10 DIAGNOSIS — I4891 Unspecified atrial fibrillation: Secondary | ICD-10-CM | POA: Diagnosis present

## 2018-04-10 DIAGNOSIS — I5032 Chronic diastolic (congestive) heart failure: Secondary | ICD-10-CM | POA: Diagnosis present

## 2018-04-10 DIAGNOSIS — I429 Cardiomyopathy, unspecified: Secondary | ICD-10-CM | POA: Diagnosis present

## 2018-04-10 DIAGNOSIS — I10 Essential (primary) hypertension: Secondary | ICD-10-CM | POA: Diagnosis present

## 2018-04-10 DIAGNOSIS — I2510 Atherosclerotic heart disease of native coronary artery without angina pectoris: Secondary | ICD-10-CM | POA: Diagnosis present

## 2018-04-10 DIAGNOSIS — J440 Chronic obstructive pulmonary disease with acute lower respiratory infection: Secondary | ICD-10-CM | POA: Diagnosis present

## 2018-04-10 DIAGNOSIS — D649 Anemia, unspecified: Secondary | ICD-10-CM | POA: Diagnosis present

## 2018-04-10 DIAGNOSIS — E119 Type 2 diabetes mellitus without complications: Secondary | ICD-10-CM | POA: Diagnosis present

## 2018-04-10 DIAGNOSIS — Z8249 Family history of ischemic heart disease and other diseases of the circulatory system: Secondary | ICD-10-CM

## 2018-04-10 DIAGNOSIS — Z7951 Long term (current) use of inhaled steroids: Secondary | ICD-10-CM | POA: Diagnosis not present

## 2018-04-10 DIAGNOSIS — C787 Secondary malignant neoplasm of liver and intrahepatic bile duct: Secondary | ICD-10-CM | POA: Diagnosis present

## 2018-04-10 DIAGNOSIS — J449 Chronic obstructive pulmonary disease, unspecified: Secondary | ICD-10-CM | POA: Diagnosis present

## 2018-04-10 DIAGNOSIS — F17210 Nicotine dependence, cigarettes, uncomplicated: Secondary | ICD-10-CM | POA: Diagnosis present

## 2018-04-10 DIAGNOSIS — A419 Sepsis, unspecified organism: Principal | ICD-10-CM | POA: Diagnosis present

## 2018-04-10 DIAGNOSIS — Z66 Do not resuscitate: Secondary | ICD-10-CM | POA: Diagnosis present

## 2018-04-10 DIAGNOSIS — I110 Hypertensive heart disease with heart failure: Secondary | ICD-10-CM | POA: Diagnosis present

## 2018-04-10 DIAGNOSIS — C61 Malignant neoplasm of prostate: Secondary | ICD-10-CM | POA: Diagnosis present

## 2018-04-10 DIAGNOSIS — J9601 Acute respiratory failure with hypoxia: Secondary | ICD-10-CM | POA: Diagnosis present

## 2018-04-10 DIAGNOSIS — C7951 Secondary malignant neoplasm of bone: Secondary | ICD-10-CM | POA: Diagnosis present

## 2018-04-10 DIAGNOSIS — Z79899 Other long term (current) drug therapy: Secondary | ICD-10-CM | POA: Diagnosis not present

## 2018-04-10 LAB — HEPATIC FUNCTION PANEL
ALT: 161 U/L — ABNORMAL HIGH (ref 0–44)
AST: 637 U/L — ABNORMAL HIGH (ref 15–41)
Albumin: 2.3 g/dL — ABNORMAL LOW (ref 3.5–5.0)
Alkaline Phosphatase: 569 U/L — ABNORMAL HIGH (ref 38–126)
BILIRUBIN DIRECT: 0.8 mg/dL — AB (ref 0.0–0.2)
BILIRUBIN INDIRECT: 0.9 mg/dL (ref 0.3–0.9)
Total Bilirubin: 1.7 mg/dL — ABNORMAL HIGH (ref 0.3–1.2)
Total Protein: 5.6 g/dL — ABNORMAL LOW (ref 6.5–8.1)

## 2018-04-10 LAB — BASIC METABOLIC PANEL
Anion gap: 10 (ref 5–15)
BUN: 31 mg/dL — AB (ref 8–23)
CO2: 22 mmol/L (ref 22–32)
CREATININE: 1.68 mg/dL — AB (ref 0.61–1.24)
Calcium: 7.3 mg/dL — ABNORMAL LOW (ref 8.9–10.3)
Chloride: 107 mmol/L (ref 98–111)
GFR calc non Af Amer: 39 mL/min — ABNORMAL LOW (ref >60)
GFR, EST AFRICAN AMERICAN: 45 mL/min — AB (ref >60)
Glucose, Bld: 109 mg/dL — ABNORMAL HIGH (ref 70–99)
POTASSIUM: 4.7 mmol/L (ref 3.5–5.1)
SODIUM: 139 mmol/L (ref 135–145)

## 2018-04-10 LAB — I-STAT VENOUS BLOOD GAS, ED
Acid-base deficit: 3 mmol/L — ABNORMAL HIGH (ref 0.0–2.0)
Bicarbonate: 21.9 mmol/L (ref 20.0–28.0)
O2 SAT: 51 %
TCO2: 23 mmol/L (ref 22–32)
pCO2, Ven: 34.9 mmHg — ABNORMAL LOW (ref 44.0–60.0)
pH, Ven: 7.406 (ref 7.250–7.430)
pO2, Ven: 27 mmHg — CL (ref 32.0–45.0)

## 2018-04-10 LAB — I-STAT TROPONIN, ED: Troponin i, poc: 0.35 ng/mL (ref 0.00–0.08)

## 2018-04-10 LAB — CBC
HCT: 14.1 % — ABNORMAL LOW (ref 39.0–52.0)
HEMOGLOBIN: 4.2 g/dL — AB (ref 13.0–17.0)
MCH: 29 pg (ref 26.0–34.0)
MCHC: 29.8 g/dL — AB (ref 30.0–36.0)
MCV: 97.2 fL (ref 80.0–100.0)
NRBC: 0 % (ref 0.0–0.2)
Platelets: 73 10*3/uL — ABNORMAL LOW (ref 150–400)
RBC: 1.45 MIL/uL — ABNORMAL LOW (ref 4.22–5.81)
RDW: 16.2 % — AB (ref 11.5–15.5)
WBC: 9.6 10*3/uL (ref 4.0–10.5)

## 2018-04-10 LAB — I-STAT CG4 LACTIC ACID, ED: Lactic Acid, Venous: 4.12 mmol/L (ref 0.5–1.9)

## 2018-04-10 LAB — TYPE AND SCREEN
ABO/RH(D): A POS
Antibody Screen: NEGATIVE

## 2018-04-10 LAB — BRAIN NATRIURETIC PEPTIDE: B NATRIURETIC PEPTIDE 5: 962.3 pg/mL — AB (ref 0.0–100.0)

## 2018-04-10 LAB — ABO/RH: ABO/RH(D): A POS

## 2018-04-10 MED ORDER — ACETAMINOPHEN 325 MG PO TABS: 650.0000 mg | ORAL_TABLET | Freq: Four times a day (QID) | ORAL | Status: DC | PRN

## 2018-04-10 MED ORDER — SODIUM CHLORIDE 0.9% FLUSH: 3.0000 mL | Freq: Two times a day (BID) | INTRAVENOUS | Status: DC

## 2018-04-10 MED ORDER — IPRATROPIUM-ALBUTEROL 0.5-2.5 (3) MG/3ML IN SOLN
3.0000 mL | Freq: Once | RESPIRATORY_TRACT | Status: AC
  Administered 2018-04-10: 3 mL via RESPIRATORY_TRACT
  Filled 2018-04-10: qty 3

## 2018-04-10 MED ORDER — OXYCODONE HCL 5 MG PO TABS: 5.0000 mg | ORAL_TABLET | ORAL | Status: DC | PRN

## 2018-04-10 MED ORDER — SODIUM CHLORIDE 0.9 % IV SOLN: 250.0000 mL | INTRAVENOUS | Status: DC | PRN

## 2018-04-10 MED ORDER — ALBUTEROL SULFATE (2.5 MG/3ML) 0.083% IN NEBU
5.0000 mg | INHALATION_SOLUTION | Freq: Once | RESPIRATORY_TRACT | Status: DC
  Filled 2018-04-10: qty 6

## 2018-04-10 MED ORDER — ACETAMINOPHEN 650 MG RE SUPP: 650.0000 mg | Freq: Four times a day (QID) | RECTAL | Status: DC | PRN

## 2018-04-10 MED ORDER — SODIUM CHLORIDE 0.9% FLUSH: 3.0000 mL | INTRAVENOUS | Status: DC | PRN

## 2018-04-10 NOTE — ED Provider Notes (Addendum)
[facility] EMERGENCY DEPARTMENT Provider Note   CSN: [id number] Arrival date & time: 04/10/18  2053     History   Chief Complaint Chief Complaint  Patient presents with  . Shortness of Breath    HPI Michael Mathews is a 72 y.o. male.  The history is provided by the patient.  Shortness of Breath  This is a chronic problem. The problem occurs continuously.The current episode started more than 1 week ago. The problem has not changed since onset.Associated symptoms include cough. Pertinent negatives include no fever, no headaches, no rhinorrhea, no sore throat, no ear pain, no sputum production, no hemoptysis, no orthopnea, no chest pain, no syncope, no vomiting, no abdominal pain, no rash and no leg pain. It is unknown what precipitated the problem. He has tried nothing for the symptoms. The treatment provided no relief. He has had prior hospitalizations. He has had prior ED visits. He has had prior ICU admissions. Associated medical issues include COPD. Associated medical issues comments: metastatic prostate cancer.    Past Medical History:  Diagnosis Date  . Anemia   . Arthritis   . COPD (chronic obstructive pulmonary disease) ([place])   . Coronary artery disease   . Cough    chronic  . Diabetes mellitus without complication ([place])   . Edema   . Elevated PSA   . Headache   . Hypertension   . Orthopnea   . Oxygen deficit    o2 prn  . Prostate cancer ([place])   . Shortness of breath dyspnea   . Sleep apnea    cpap    Patient Active Problem List   Diagnosis Date Noted  . Acute respiratory failure with hypoxia ([place]) 04/10/2018  . COPD exacerbation ([place]) 02/13/2018  . Atrial fibrillation with RVR ([place]) 01/04/2018  . Anemia 12/28/2017  . Chest pain 01/19/2017  . 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 Goals of care, counseling/discussion 07/28/2016  . Elevated PSA 05/01/2016  . Urinary retention 05/01/2016  . Pneumonia 03/23/2016  . ARF  (acute renal failure) ([place]) 03/11/2016  . Dizziness 03/03/2016  . Mixed hyperlipidemia 09/01/2015  . OSA (obstructive sleep apnea) 09/01/2015  . Atherosclerotic peripheral vascular disease with intermittent claudication ([place]) 04/16/2015  . Lumbar radiculitis 08/13/2014  . Lumbar stenosis with neurogenic claudication 08/13/2014  . Cardiomyopathy ([place]) 01/23/2013  . DOE (dyspnea on exertion) 01/23/2013  . Hyperlipidemia 01/23/2013  . PVC (premature ventricular contraction) 01/23/2013  . Nonspecific elevation of levels of transaminase and lactic acid dehydrogenase (ldh) 07/21/2011  . Type 2 diabetes mellitus without complications ([place]) 63/84/5364  . Sciatica 06/28/2010  . Chronic obstructive pulmonary disease ([place]) 12/02/2008  . Essential (primary) hypertension 09/05/2007  . Personal history of transient ischemic attack (TIA), and cerebral infarction without residual deficits 09/05/2007    Past Surgical History:  Procedure Laterality Date  . CARDIAC CATHETERIZATION  2014  . COLONOSCOPY    . CYSTOSCOPY W/ RETROGRADES Bilateral 10/18/2016   Procedure: CYSTOSCOPY WITH RETROGRADE PYELOGRAM;  Surgeon: Hollice Espy, MD;  Location: [facility] ORS;  Service: Urology;  Laterality: Bilateral;  . EYE SURGERY    . KNEE ARTHROSCOPY    . PORTA CATH INSERTION N/A 02/13/2018   Procedure: PORTA CATH INSERTION;  Surgeon: Katha Cabal, MD;  Location: [facility];  Service: Cardiovascular;  Laterality: N/A;  . TRANSURETHRAL RESECTION OF BLADDER TUMOR N/A 10/18/2016   Procedure: TRANSURETHRAL RESECTION OF BLADDER TUMOR (TURBT);  Surgeon: Hollice Espy, MD;  Location: [facility] ORS;  Service: Urology;  Laterality: N/A;  . TRANSURETHRAL RESECTION OF PROSTATE N/A 10/18/2016   Procedure: TRANSURETHRAL RESECTION OF THE PROSTATE (TURP) CHANNEL TURP;  Surgeon: Hollice Espy, MD;  Location: [facility] ORS;  Service: Urology;  Laterality: N/A;        Home Medications    Prior to Admission medications     Medication Sig Start Date End Date Taking? Authorizing Provider  amiodarone (PACERONE) 200 MG tablet Take 1 tablet (200 mg total) by mouth 2 (two) times daily. 01/06/18   Dustin Flock, MD  amLODipine (NORVASC) 10 MG tablet Take 1 tablet (10 mg total) by mouth daily. 02/15/18   [name], MD  DULoxetine (CYMBALTA) 30 MG capsule Take 1 capsule (30 mg total) by mouth daily. 02/14/18   [name], MD  finasteride (PROSCAR) 5 MG tablet Take 5 mg by mouth daily.    [provider]  Fluticasone-Salmeterol (ADVAIR) 250-50 MCG/DOSE AEPB Inhale 1 puff into the lungs 2 (two) times daily.    [provider]  ketoconazole (NIZORAL) 2 % cream Apply 1 application topically daily as needed.  04/03/17   [provider]  lidocaine-prilocaine (EMLA) cream Apply to affected area once 01/11/18   Sindy Guadeloupe, MD  metoprolol tartrate (LOPRESSOR) 100 MG tablet Take 1 tablet (100 mg total) by mouth 2 (two) times daily. 01/06/18   Dustin Flock, MD  pantoprazole (PROTONIX) 40 MG tablet Take 1 tablet (40 mg total) by mouth daily. 12/30/17   Loletha Grayer, MD  predniSONE (STERAPRED UNI-PAK 21 TAB) 10 MG (21) TBPK tablet Take 6 tabs first day, 5 tab on day 2, then 4 on day 3rd, 3 tabs on day 4th , 2 tab on day 5th, and 1 tab on 6th day. 02/14/18   [name], MD  tamsulosin (FLOMAX) 0.4 MG CAPS capsule Take 1 capsule (0.4 mg total) by mouth daily. 05/29/16   [name], MD  vitamin B-12 (CYANOCOBALAMIN) 1000 MCG tablet Take 1 tablet (1,000 mcg total) by mouth daily. 07/28/16   Sindy Guadeloupe, MD    Family History Family History  Problem Relation Age of Onset  . CVA Mother   . CAD Father     Social History Social History   Tobacco Use  . Smoking status: Current Every Day Smoker    Packs/day: 0.50    Years: 55.00    Pack years: 27.50    Types: Cigarettes  . Smokeless tobacco: Never Used  Substance Use Topics  . Alcohol use: No  . Drug use: No      Allergies   Patient has no known allergies.   Review of Systems Review of Systems  Constitutional: Positive for chills. Negative for fever.  HENT: Negative for ear pain, rhinorrhea and sore throat.   Eyes: Negative for pain and visual disturbance.  Respiratory: Positive for cough and shortness of breath. Negative for hemoptysis and sputum production.   Cardiovascular: Negative for chest pain, palpitations, orthopnea and syncope.  Gastrointestinal: Negative for abdominal pain and vomiting.  Genitourinary: Negative for dysuria and hematuria.  Musculoskeletal: Negative for arthralgias and back pain.  Skin: Negative for color change and rash.  Neurological: Positive for weakness. Negative for seizures, syncope and headaches.  All other systems reviewed and are negative.    Physical Exam Updated Vital Signs  ED Triage Vitals  Enc Vitals Group     BP 04/10/18 2104 (!) 118/57     Pulse Rate 04/10/18 2104 (!) 114     Resp 04/10/18 2104 20  Temp 04/10/18 2104 99.8 F (37.7 C)     Temp Source 04/10/18 2104 Rectal     SpO2 04/10/18 2055 95 %     Weight 04/10/18 2105 231 lb 7.7 oz (105 kg)     Height 04/10/18 2105 5\' 10"  (1.778 m)     Head Circumference --      Peak Flow --      Pain Score 04/10/18 2105 0     Pain Loc --      Pain Edu? --      Excl. in [place]? --     Physical Exam  Constitutional: He is oriented to person, place, and time. He appears toxic. He appears ill. He appears distressed.  HENT:  Head: Normocephalic and atraumatic.  Mouth/Throat: No posterior oropharyngeal edema.  Pale conjunctiva  Eyes: Pupils are equal, round, and reactive to light. Conjunctivae and EOM are normal.  Neck: Normal range of motion. Neck supple.  Cardiovascular: Normal rate and regular rhythm.  No murmur heard. Pulmonary/Chest: Tachypnea noted. He is in respiratory distress. He has decreased breath sounds. He has wheezes. He has rhonchi. He has rales.  Abdominal: Soft. There is no  tenderness.  Musculoskeletal: Normal range of motion.       Right lower leg: He exhibits edema.       Left lower leg: He exhibits edema.  Neurological: He is alert and oriented to person, place, and time. He is not disoriented.  Skin: No rash noted. There is pallor.  Psychiatric: He has a normal mood and affect. His behavior is normal.  Nursing note and vitals reviewed.    ED Treatments / Results  Labs (all labs ordered are listed, but only abnormal results are displayed) Labs Reviewed  BASIC METABOLIC PANEL - Abnormal; Notable for the following components:      Result Value   Glucose, Bld 109 (*)    BUN 31 (*)    Creatinine, Ser 1.68 (*)    Calcium 7.3 (*)    GFR calc non Af Amer 39 (*)    GFR calc Af Amer 45 (*)    All other components within normal limits  CBC - Abnormal; Notable for the following components:   RBC 1.45 (*)    Hemoglobin 4.2 (*)    HCT 14.1 (*)    MCHC 29.8 (*)    RDW 16.2 (*)    Platelets 73 (*)    All other components within normal limits  HEPATIC FUNCTION PANEL - Abnormal; Notable for the following components:   Total Protein 5.6 (*)    Albumin 2.3 (*)    AST 637 (*)    ALT 161 (*)    Alkaline Phosphatase 569 (*)    Total Bilirubin 1.7 (*)    Bilirubin, Direct 0.8 (*)    All other components within normal limits  BRAIN NATRIURETIC PEPTIDE - Abnormal; Notable for the following components:   B Natriuretic Peptide 962.3 (*)    All other components within normal limits  I-STAT TROPONIN, ED - Abnormal; Notable for the following components:   Troponin i, poc 0.35 (*)    All other components within normal limits  I-STAT VENOUS BLOOD GAS, ED - Abnormal; Notable for the following components:   pCO2, Ven 34.9 (*)    pO2, Ven 27.0 (*)    Acid-base deficit 3.0 (*)    All other components within normal limits  I-STAT CG4 LACTIC ACID, ED - Abnormal; Notable for the following components:   Lactic Acid,  Venous 4.12 (*)    All other components within normal  limits  TYPE AND SCREEN  ABO/RH    EKG EKG Interpretation  Date/Time:  Wednesday April 10 2018 21:03:04 EDT Ventricular Rate:  114 PR Interval:    QRS Duration: 95 QT Interval:  340 QTC Calculation: 469 R Axis:   101 Text Interpretation:  Atrial fibrillation Probable anteroseptal infarct, recent Lateral leads are also involved Partial missing lead(s): V1 Confirmed by Lennice Sites [phone]) on 04/10/2018 9:25:44 PM   Radiology Dg Chest Portable 1 View  Result Date: 04/10/2018 CLINICAL DATA:  Shortness of breath. EXAM: PORTABLE CHEST 1 VIEW COMPARISON:  02/13/2018 and chest CT 03/18/2018 FINDINGS: Right IJ Port-A-Cath unchanged. Lungs are adequately inflated demonstrate hazy prominence of the perihilar markings likely a degree of vascular congestion. There is hazy opacification over the right lung likely mostly due to layering moderate size right pleural effusion. Mild patchy density within the right lung which may be due to infection. Mild stable cardiomegaly. Diffuse patchy sclerosis throughout the bony structures compatible with known metastatic prostate cancer. Remainder of the exam is unchanged. IMPRESSION: Hazy opacification over the right lung likely due mostly to layering pleural effusion with basilar atelectasis. Patchy density over the right midlung which may be due to infection. Cardiomegaly with hazy prominence of the perihilar markings suggesting a degree of interstitial edema. Evidence of known bone metastases from prostate cancer. Electronically Signed   By: Marin Olp M.D.   On: 04/10/2018 22:03    Procedures .Critical Care Performed by: Lennice Sites, DO Authorized by: Lennice Sites, DO   Critical care provider statement:    Critical care time (minutes):  40   Critical care was necessary to treat or prevent imminent or life-threatening deterioration of the following conditions:  Respiratory failure and circulatory failure (anemia)   Critical care was time spent  personally by me on the following activities:  Development of treatment plan with patient or surrogate, discussions with primary provider, ordering and performing treatments and interventions, ordering and review of laboratory studies, ordering and review of radiographic studies, re-evaluation of patient's condition, pulse oximetry and review of old charts (end of life care decision and DNR decision )   I assumed direction of critical care for this patient from another provider in my specialty: no     (including critical care time)  Medications Ordered in ED Medications  sodium chloride flush (NS) 0.9 % injection 3 mL (has no administration in time range)  sodium chloride flush (NS) 0.9 % injection 3 mL (has no administration in time range)  0.9 %  sodium chloride infusion (has no administration in time range)  acetaminophen (TYLENOL) tablet 650 mg (has no administration in time range)    Or  acetaminophen (TYLENOL) suppository 650 mg (has no administration in time range)  oxyCODONE (Oxy IR/ROXICODONE) immediate release tablet 5 mg (has no administration in time range)  ipratropium-albuterol (DUONEB) 0.5-2.5 (3) MG/3ML nebulizer solution 3 mL (3 mLs Nebulization Given 04/10/18 2140)     Initial Impression / Assessment and Plan / ED Course  I have reviewed the triage vital signs and the nursing notes.  Pertinent labs & imaging results that were available during my care of the patient were reviewed by me and considered in my medical decision making (see chart for details).     Michael Mathews is a 72 year old male with history of metastatic prostate cancer, COPD on 2 L of oxygen who presents to the ED with shortness of breath.  Patient is tachycardic upon arrival but otherwise unremarkable vitals.  Patient is requiring about 4 to 5 L of oxygen to maintain saturations above 90%.  Patient recently transition to new nursing home and was sent for shortness of breath evaluation.  Patient overall is  chronically ill-appearing.  He is pale and tachycardic.  Patient has atrial fibrillation on EKG.  Patient has shortness of breath but no chest pain.  Overall patient appears uncomfortable.  He does not have any specific complaints.  Upon chart review it appears that patient had recent palliative care consultation in which he was made DNR however patient does not have DNR form with him.  Patient had recent admission for low hemoglobin and required multiple blood transfusions.  It appears that patient has been trying to transition to hospice care.  Patient states that he wants to be DNR and at this time will obtain lab work and contact nursing home for possible confirmation of his DNR.  Patient found to have elevated troponin, anemia with a hemoglobin of 4.2, possible pneumonia, elevated lactic acid. Patient appears critically ill with symptomatic anemia causing cardiac and pulmonary compromise.  Overall patient is ill-appearing.  Reengaged the patient and had long discussion with him about his goals of care plan.  Was able to confirm that patient is a DO NOT RESUSCITATE.  And upon further discussion with the patient he would like to go comfort care only.  He would not like any blood transfusion, antibiotics.  Patient has no next of kin.  Patient has capacity to make this decision.  At this time a DNR form was filled out by myself.  All interventions were stopped except for pain control and oxygen.  Social work was engaged and patient is not able to be placed in hospice at this time and will need to be admitted until he can be transferred to for hospice care.  Palliative care consultation was placed.  Patient was transferred to the floor with medicine.  Final Clinical Impressions(s) / ED Diagnoses   Final diagnoses:  Symptomatic anemia  Acute respiratory failure with hypoxia [facility])    ED Discharge Orders    None       Lennice Sites, DO 04/11/18 0015    Lennice Sites, DO 04/11/18 2575

## 2018-04-10 NOTE — ED Triage Notes (Signed)
Pt BIB [facility] for SOB that started this date. EMS reports the pt was just admitted into a new facility today, [facility]. EMS reports rhonchi in all fields with normal oxygen saturations. EMS reports pt is in no distress at this time. EMS also reports pt in A-fib on their monitor.   Pt answers simple questions but won't give up more information. Pt just reports feeling SOB and not feeling well.

## 2018-04-10 NOTE — Progress Notes (Addendum)
CSW called on call number for [facility] [phone]. CSW received call back from on call nurse, Shelton Silvas. Per Shelton Silvas, pt is at [facility] in [place]. Pt moved there today. Shelton Silvas informed CSW that [facility] nurse sent pt to the hospital today due to SOB. [facility] confirmed that they do not have DNR on file.   CSW called to [facility] to check bed availability at their hospice facilities. No bed available at this time.   CSW will leave handoff for daytime floor CSW to follow up with hospice placement.   Wendelyn Breslow, [facility]  [phone]

## 2018-04-11 ENCOUNTER — Encounter (HOSPITAL_COMMUNITY): Payer: Self-pay | Admitting: Internal Medicine

## 2018-04-11 DIAGNOSIS — A419 Sepsis, unspecified organism: Principal | ICD-10-CM

## 2018-04-11 DIAGNOSIS — J9601 Acute respiratory failure with hypoxia: Secondary | ICD-10-CM

## 2018-04-11 DIAGNOSIS — C7951 Secondary malignant neoplasm of bone: Secondary | ICD-10-CM

## 2018-04-11 DIAGNOSIS — N179 Acute kidney failure, unspecified: Secondary | ICD-10-CM

## 2018-04-11 DIAGNOSIS — R6521 Severe sepsis with septic shock: Secondary | ICD-10-CM

## 2018-04-11 DIAGNOSIS — D649 Anemia, unspecified: Secondary | ICD-10-CM

## 2018-04-11 DIAGNOSIS — C61 Malignant neoplasm of prostate: Secondary | ICD-10-CM

## 2018-04-11 MED ORDER — METOPROLOL TARTRATE 100 MG PO TABS
100.0000 mg | ORAL_TABLET | Freq: Two times a day (BID) | ORAL | Status: DC
  Administered 2018-04-11 (×2): 100 mg via ORAL
  Filled 2018-04-11 (×3): qty 1

## 2018-04-11 MED ORDER — ONDANSETRON HCL 4 MG PO TABS: 4.0000 mg | ORAL_TABLET | Freq: Four times a day (QID) | ORAL | Status: DC | PRN

## 2018-04-11 MED ORDER — HALOPERIDOL LACTATE 5 MG/ML IJ SOLN: 1.0000 mg | Freq: Four times a day (QID) | INTRAMUSCULAR | Status: DC | PRN

## 2018-04-11 MED ORDER — MORPHINE SULFATE (PF) 2 MG/ML IV SOLN
1.0000 mg | INTRAVENOUS | Status: DC | PRN
  Administered 2018-04-11: 1 mg via INTRAVENOUS
  Filled 2018-04-11: qty 1

## 2018-04-11 MED ORDER — LORAZEPAM 2 MG/ML IJ SOLN: 1.0000 mg | Freq: Four times a day (QID) | INTRAMUSCULAR | Status: DC | PRN

## 2018-04-11 MED ORDER — FINASTERIDE 5 MG PO TABS
5.0000 mg | ORAL_TABLET | Freq: Every day | ORAL | Status: DC
  Administered 2018-04-11: 5 mg via ORAL
  Filled 2018-04-11: qty 1

## 2018-04-11 MED ORDER — VITAMIN B-12 1000 MCG PO TABS
1000.0000 ug | ORAL_TABLET | Freq: Every day | ORAL | Status: DC
  Administered 2018-04-11: 1000 ug via ORAL
  Filled 2018-04-11: qty 1

## 2018-04-11 MED ORDER — PANTOPRAZOLE SODIUM 40 MG PO TBEC
40.0000 mg | DELAYED_RELEASE_TABLET | Freq: Every day | ORAL | Status: DC
  Administered 2018-04-11: 40 mg via ORAL
  Filled 2018-04-11: qty 1

## 2018-04-11 MED ORDER — ACETAMINOPHEN 650 MG RE SUPP: 650.0000 mg | Freq: Four times a day (QID) | RECTAL | Status: DC | PRN

## 2018-04-11 MED ORDER — GLYCOPYRROLATE 0.2 MG/ML IJ SOLN: 0.2000 mg | INTRAMUSCULAR | Status: DC | PRN

## 2018-04-11 MED ORDER — AMIODARONE HCL 200 MG PO TABS
200.0000 mg | ORAL_TABLET | Freq: Two times a day (BID) | ORAL | Status: DC
  Administered 2018-04-11 (×2): 200 mg via ORAL
  Filled 2018-04-11 (×3): qty 1

## 2018-04-11 MED ORDER — DULOXETINE HCL 30 MG PO CPEP
30.0000 mg | ORAL_CAPSULE | Freq: Every day | ORAL | Status: DC
  Administered 2018-04-11: 30 mg via ORAL
  Filled 2018-04-11 (×2): qty 1

## 2018-04-11 MED ORDER — HYDROMORPHONE HCL 1 MG/ML IJ SOLN
0.5000 mg | INTRAMUSCULAR | Status: DC | PRN
  Administered 2018-04-11: 0.5 mg via INTRAVENOUS
  Filled 2018-04-11: qty 1

## 2018-04-11 MED ORDER — ONDANSETRON HCL 4 MG/2ML IJ SOLN: 4.0000 mg | Freq: Four times a day (QID) | INTRAMUSCULAR | Status: DC | PRN

## 2018-04-11 MED ORDER — MOMETASONE FURO-FORMOTEROL FUM 200-5 MCG/ACT IN AERO
2.0000 | INHALATION_SPRAY | Freq: Two times a day (BID) | RESPIRATORY_TRACT | Status: DC
  Administered 2018-04-11 (×2): 2 via RESPIRATORY_TRACT
  Filled 2018-04-11: qty 8.8

## 2018-04-11 MED ORDER — ACETAMINOPHEN 325 MG PO TABS: 650.0000 mg | ORAL_TABLET | Freq: Four times a day (QID) | ORAL | Status: DC | PRN

## 2018-04-11 MED ORDER — AMLODIPINE BESYLATE 10 MG PO TABS
10.0000 mg | ORAL_TABLET | Freq: Every day | ORAL | Status: DC
  Administered 2018-04-11: 10 mg via ORAL
  Filled 2018-04-11: qty 1

## 2018-04-11 MED ORDER — TAMSULOSIN HCL 0.4 MG PO CAPS
0.4000 mg | ORAL_CAPSULE | Freq: Every day | ORAL | Status: DC
  Administered 2018-04-11: 0.4 mg via ORAL
  Filled 2018-04-11: qty 1

## 2018-04-11 NOTE — Social Work (Signed)
Clinical Social Worker facilitated patient discharge including contacting patient family and facility to confirm patient discharge plans.  Clinical information faxed to facility and family agreeable with plan.  CSW arranged ambulance transport via [facility] to [facility]. RN to call [phone] with report prior to discharge.  Clinical Social Worker will sign off for now as social work intervention is no longer needed. Please consult [place] again if new need arises.  Alexander Mt, [place] Social Worker [phone]

## 2018-04-11 NOTE — Progress Notes (Signed)
Patient is for discharge to hospice home of [facility], gave report to Sharlyne Pacas nurse. Awaiting for transport

## 2018-04-11 NOTE — Telephone Encounter (Signed)
I called Michael Mathews [facility] and asked to speak with patient's nurse for a clinical update. I was transferred to the office director, Michael Mathews, who then placed me on speaker phone with the nurse, Michael Mathews. I asked for a clinical update. I was told that Michael Mathews was trying to place him in an ALF in [place]. I verbalized disagreement with this plan and instead suggested that patient be transferred to an inpatient Michael facility given the rapidity of his decline. I was told that they did not feel that patient was clinically appropriate for inpatient [facility]. I explained that patient had become progressively weaker, which was likely due to worsening anemia. Also, he had minimal oral intake and worsening edema. In my opinion, his life expectancy was likely days to weeks, which would make him clinically appropriate for a [facility] facility. Additionally, patient has adamantly and consistently expressed his desire to remain in [place] and not be placed in a nursing facility. Michael Mathews, the director, said that she had sent information to Michael Mathews and that they had refused to accept the transfer. She agreed to resend this information.   I then called and spoke with the referral office for Michael Mathews and the Michael Mathews. I was told that they had not received a referral for this patient. I asked that they reach out to Michael Mathews and Michael Mathews to request this information.   I had another conversation with Michael Mathews later in the day and was informed that she would again resubmit information for the Michael Mathews. I have also had multiple conversations with the referral center to follow up on the status of the referral for the Michael Mathews. I have been informed by Michael Mathews that Michael Mathews has been reluctant to provide information to facilitate the transfer and also expressed to them a perception that he was not clinically appropriate for a Michael facility.

## 2018-04-11 NOTE — Progress Notes (Signed)
Palliative Medicine RN Note: Patient cannot transfer to [place] until [facility] sees patient. No liason or RN from [facility] is documented to have seen Mr Baccam despite them sending him in and him being admitted as inpatient yesterday.  I spoke with their liason Freddie Breech [phone]); he will call the office and let me know when a nurse is coming. I did explain that we are unable to provide an appropriate transfer until they come and see their patient. Dr Hilma Favors is requesting stat visit from them so we can help Mr Bethel have the peaceful death NOT in the hospital that he desires.  Marjie Skiff Mylasia Vorhees, RN, BSN, [facility] Palliative Medicine Team 04/11/2018 12:18 PM Office [phone]

## 2018-04-11 NOTE — Progress Notes (Signed)
Patient very familiar to me from the clinic.  We have been trying to get patient transferred to an inpatient hospice facility, which patient had verbalized was his desire at end-of-life.  Unfortunately, patient's hospice agency transferred him to an assisted living facility instead.    Patient's primary goal was to be comfortable at end-of-life.  A MOST Form was completed detailing these wishes.  Agree with comfort care.  Recommend switching from morphine to hydromorphone given renal failure.  Recommend liberalizing comfort medications.  Could also consider stopping non-comfort medications.  Case discussed with Threasa Beards, RN with the palliative care team.

## 2018-04-11 NOTE — Social Work (Signed)
CSW spoke with Doreatha Lew, aware that pt is set up for transfer to [facility]. Will arrange [facility] for 6pm tonight.  Westley Hummer, MSW, [place] Work [phone]

## 2018-04-11 NOTE — H&P (Signed)
History and Physical    Michael Mathews UMP:[id number] DOB: 08/15/1945 DOA: 04/10/2018  PCP: [name], MD  Patient coming from: [place].  Chief Complaint: Shortness of breath.  HPI: Michael Mathews is a 72 y.o. 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  Patient states he has been feeling weak and short of breath the last few days.  Denies any chest pain.  ED Course: In the ER patient is found to be hypoxic and chest x-ray shows congestion versus infiltrates.  EKG shows A. fib with RVR.  Hemoglobin is further decreased to 4 despite having recent transfusions.  Patient's LFTs are markedly elevated creatinine has worsened.  Patient at this time has clearly requested only comfort measures and no aggressive measures.  Review of Systems: As per HPI, rest all negative.   Past Medical History:  Diagnosis Date  . Anemia   . Arthritis   . COPD (chronic obstructive pulmonary disease) ([place])   . Coronary artery disease   . Cough    chronic  . Diabetes mellitus without complication ([place])   . Edema   . Elevated PSA   . Headache   . Hypertension   . Orthopnea   . Oxygen deficit    o2 prn  . Prostate cancer ([place])   . Shortness of breath dyspnea   . Sleep apnea    cpap    Past Surgical History:  Procedure Laterality Date  . CARDIAC CATHETERIZATION  2014  . COLONOSCOPY    . CYSTOSCOPY W/ RETROGRADES Bilateral 10/18/2016   Procedure: CYSTOSCOPY WITH RETROGRADE PYELOGRAM;  Surgeon: Hollice Espy, MD;  Location: [facility] ORS;  Service: Urology;  Laterality: Bilateral;  . EYE SURGERY    . KNEE ARTHROSCOPY    . PORTA CATH INSERTION N/A 02/13/2018   Procedure: PORTA CATH INSERTION;  Surgeon: Katha Cabal, MD;  Location: [facility];  Service: Cardiovascular;  Laterality: N/A;  . TRANSURETHRAL RESECTION OF BLADDER TUMOR N/A 10/18/2016   Procedure: TRANSURETHRAL RESECTION OF BLADDER TUMOR (TURBT);  Surgeon: Hollice Espy, MD;  Location: [facility] ORS;  Service: Urology;  Laterality: N/A;  . TRANSURETHRAL RESECTION OF PROSTATE N/A 10/18/2016   Procedure: TRANSURETHRAL RESECTION OF THE PROSTATE (TURP) CHANNEL TURP;  Surgeon: Hollice Espy, MD;  Location: [facility] ORS;  Service: Urology;  Laterality: N/A;     reports that he has been smoking cigarettes. He has a 27.50 pack-year smoking history. He has never used smokeless tobacco. He reports that he does not drink alcohol or use drugs.  No Known Allergies  Family History  Problem Relation Age of Onset  . CVA Mother   . CAD Father     Prior to Admission medications   Medication Sig Start Date End Date Taking? Authorizing Provider  amiodarone (PACERONE) 200 MG tablet Take 1 tablet (200 mg total) by mouth 2 (two) times daily. 01/06/18   Dustin Flock, MD  amLODipine (NORVASC) 10 MG tablet Take 1 tablet (10 mg total) by mouth daily. 02/15/18   [name], MD  DULoxetine (CYMBALTA) 30 MG capsule Take 1 capsule (30 mg total) by mouth daily. 02/14/18   [name], MD  finasteride (PROSCAR) 5 MG tablet Take 5 mg by mouth daily.    [provider]  Fluticasone-Salmeterol (ADVAIR) 250-50 MCG/DOSE AEPB Inhale  1 puff into the lungs 2 (two) times daily.    [provider]  ketoconazole (NIZORAL) 2 % cream Apply 1 application topically daily as needed.  04/03/17   [provider]  lidocaine-prilocaine (EMLA) cream Apply to affected area once 01/11/18   Sindy Guadeloupe, MD  metoprolol tartrate (LOPRESSOR) 100 MG tablet Take 1 tablet (100 mg total) by mouth 2 (two) times daily. 01/06/18   Dustin Flock, MD  pantoprazole (PROTONIX) 40 MG tablet Take 1 tablet (40 mg total) by mouth daily. 12/30/17   Loletha Grayer, MD  predniSONE (STERAPRED  UNI-PAK 21 TAB) 10 MG (21) TBPK tablet Take 6 tabs first day, 5 tab on day 2, then 4 on day 3rd, 3 tabs on day 4th , 2 tab on day 5th, and 1 tab on 6th day. 02/14/18   [name], MD  tamsulosin (FLOMAX) 0.4 MG CAPS capsule Take 1 capsule (0.4 mg total) by mouth daily. 05/29/16   [name], MD  vitamin B-12 (CYANOCOBALAMIN) 1000 MCG tablet Take 1 tablet (1,000 mcg total) by mouth daily. 07/28/16   Sindy Guadeloupe, MD    Physical Exam: Vitals:   04/10/18 2300 04/10/18 2315 04/10/18 2330 04/10/18 2345  BP: 109/78 96/71 (!) 107/59 106/67  Pulse:    (!) 109  Resp: (!) 25 20 (!) 24 (!) 21  Temp:      TempSrc:      SpO2:    92%  Weight:      Height:          Constitutional: Moderately built and nourished. Vitals:   04/10/18 2300 04/10/18 2315 04/10/18 2330 04/10/18 2345  BP: 109/78 96/71 (!) 107/59 106/67  Pulse:    (!) 109  Resp: (!) 25 20 (!) 24 (!) 21  Temp:      TempSrc:      SpO2:    92%  Weight:      Height:       Eyes: Anicteric no pallor. ENMT: No discharge from the ears eyes nose or mouth. Neck: No mass or.  No neck rigidity but no JVD appreciated. Respiratory: Bilateral coarse crepitations. Cardiovascular: S1-S2 heard no murmurs appreciated. Abdomen: Soft nontender bowel sounds present. Musculoskeletal: Mild edema of the both lower extremities. Skin: No rash. Neurologic: Alert awake oriented to time place and person.  Difficulty moving his lower extremities due to weakness.  Patient states is chronic. Psychiatric: Alert awake oriented to time place and person.   Labs on Admission: I have personally reviewed following labs and imaging studies  CBC: Recent Labs  Lab 04/10/18 2112  WBC 9.6  HGB 4.2*  HCT 14.1*  MCV 97.2  PLT 73*   Basic Metabolic Panel: Recent Labs  Lab 04/10/18 2112  NA 139  K 4.7  CL 107  CO2 22  GLUCOSE 109*  BUN 31*  CREATININE 1.68*  CALCIUM 7.3*   GFR: Estimated Creatinine Clearance: 47.5 mL/min (A) (by C-G  formula based on SCr of 1.68 mg/dL (H)). Liver Function Tests: Recent Labs  Lab 04/10/18 2124  AST 637*  ALT 161*  ALKPHOS 569*  BILITOT 1.7*  PROT 5.6*  ALBUMIN 2.3*   No results for input(s): LIPASE, AMYLASE in the last 168 hours. No results for input(s): AMMONIA in the last 168 hours. Coagulation Profile: No results for input(s): INR, PROTIME in the last 168 hours. Cardiac Enzymes: No results for input(s): CKTOTAL, CKMB, CKMBINDEX, TROPONINI in the last 168 hours. BNP (last 3 results) No results for input(s):  PROBNP in the last 8760 hours. HbA1C: No results for input(s): HGBA1C in the last 72 hours. CBG: No results for input(s): GLUCAP in the last 168 hours. Lipid Profile: No results for input(s): CHOL, HDL, LDLCALC, TRIG, CHOLHDL, LDLDIRECT in the last 72 hours. Thyroid Function Tests: No results for input(s): TSH, T4TOTAL, FREET4, T3FREE, THYROIDAB in the last 72 hours. Anemia Panel: No results for input(s): VITAMINB12, FOLATE, FERRITIN, TIBC, IRON, RETICCTPCT in the last 72 hours. Urine analysis:    Component Value Date/Time   COLORURINE AMBER (A) 01/14/2018 1503   APPEARANCEUR CLEAR (A) 01/14/2018 1503   APPEARANCEUR Clear 09/04/2016 1045   LABSPEC 1.019 01/14/2018 1503   PHURINE 7.0 01/14/2018 1503   GLUCOSEU NEGATIVE 01/14/2018 1503   HGBUR NEGATIVE 01/14/2018 1503   BILIRUBINUR NEGATIVE 01/14/2018 1503   BILIRUBINUR Negative 09/04/2016 1045   KETONESUR NEGATIVE 01/14/2018 1503   PROTEINUR 100 (A) 01/14/2018 1503   NITRITE NEGATIVE 01/14/2018 1503   LEUKOCYTESUR NEGATIVE 01/14/2018 1503   LEUKOCYTESUR Negative 09/04/2016 1045   Sepsis Labs: @LABRCNTIP (procalcitonin:4,lacticidven:4) )No results found for this or any previous visit (from the past 240 hour(s)).   Radiological Exams on Admission: Dg Chest Portable 1 View  Result Date: 04/10/2018 CLINICAL DATA:  Shortness of breath. EXAM: PORTABLE CHEST 1 VIEW COMPARISON:  02/13/2018 and chest CT 03/18/2018  FINDINGS: Right IJ Port-A-Cath unchanged. Lungs are adequately inflated demonstrate hazy prominence of the perihilar markings likely a degree of vascular congestion. There is hazy opacification over the right lung likely mostly due to layering moderate size right pleural effusion. Mild patchy density within the right lung which may be due to infection. Mild stable cardiomegaly. Diffuse patchy sclerosis throughout the bony structures compatible with known metastatic prostate cancer. Remainder of the exam is unchanged. IMPRESSION: Hazy opacification over the right lung likely due mostly to layering pleural effusion with basilar atelectasis. Patchy density over the right midlung which may be due to infection. Cardiomegaly with hazy prominence of the perihilar markings suggesting a degree of interstitial edema. Evidence of known bone metastases from prostate cancer. Electronically Signed   By: Marin Olp M.D.   On: 04/10/2018 22:03    EKG: Independently reviewed.  A. fib with RVR.  Assessment/Plan Principal Problem:   Acute respiratory failure with hypoxia (HCC) Active Problems:   Chronic obstructive pulmonary disease (HCC)   Essential (primary) hypertension   OSA (obstructive sleep apnea)   Type 2 diabetes mellitus without complications (HCC)   Prostate cancer metastatic to bone (HCC)   Anemia    1. Acute respiratory failure with hypoxia likely multifactorial including worsening anemia, CHF, COPD and possible pneumonia. 2. A. fib with RVR. 3. Worsening anemia with history of metastatic prostate cancer. 4. Diabetes mellitus type 2. 5. Anemia. 6. Chronic diastolic CHF. 7. Sleep apnea. 8. COPD. 9. Markedly elevated with LFTs.  Plan -after extensive discussion with patient and reviewing his labs patient states he wants to be comfort measures and no further labs and to keep patient on PRN morphine and okay to continue his home medication but will consult palliative care.  Likely could be  terminal during this admission.   DVT prophylaxis: SCDs. Code Status: DNR and comfort measures. Family Communication: Patient states he only has a sister and does not know where she lives. Disposition Plan: To be determined.  Could be terminal at this time. Consults called: Palliative care. Admission status: Inpatient.   Rise Patience MD [facility] Pager [phone].  If 7PM-7AM, please contact night-coverage [email or web address] Password  [email or web address]  04/11/2018, 12:18 AM

## 2018-04-11 NOTE — Progress Notes (Signed)
Palliative Medicine RN Note: Discussed patient at length with PMT NP Josh Borders, who is very familiar with patient and saw him last week at home. Michael Mathews is very familiar with the patient's wishes for comfort care only and the desire for a peaceful death.  Patient was hopeful last week to go to residential hospice, but the home hospice provider instead placed him at [facility] yesterday, and he presented to ED last night.  Michael Mathews is in bed, about to eat breakfast and has just gotten a bath. He is oriented to self and place but not time or situation. He is on 2L O2 via [place]. While I don't hear terminal secretions, he does have a deep wet cough. He denies pain.  Attempted to contact listed contact in chart, but he is no longer working at the number listed, and the home number is not in service.   I called [facility]; they have also spoken w Michael Mathews about this patient.  Josh adjusted his medication regimen, including a change to hydromorphone based on renal function, as well as the addition of haloperidol, lorazepam, and glycopyrrolate. He has had vital signs changed to once daily and comfort feeds started.   Our team will continue to work with the local hospice facilities to find placement for him and to try to find family. I will follow up this afternoon to ensure continued comfort.   Michael Skiff Shaniya Tashiro, RN, BSN, [facility] Palliative Medicine Team 04/11/2018 10:24 AM Office [phone]

## 2018-04-11 NOTE — Progress Notes (Signed)
New referral received on Mr. Stacie Acres for inpatient hospice last evening 10.30.19 from [facility].  Patient was sent to the ED at [facility], prior to assessment by our team for inpatient placement.  Patient was seen by Billey Chang NP in palliative clinic. Patient is appropriate for inpatient hospice for symptom management of dyspnea and anxiety.  Will plan to have patient transport to the hospice home this evening.  Requesting 1800 pick up from [facility]  RN will need to call report to [phone].  We have been unable to get in touch with Mr. Maclellan neighbor- whom Mr. [place]a stated he wanted to be his surrogate decision maker if he was unable to do so (per Billey Chang NP) and that Mr. Kinnan wanted to be in [facility]'s inpatient facility.  We will continue to try to reach his neighbor to see if he is willing to sign for the patient.  Mean while, to not delay Mr. Dierks getting to the hospice home- we will have 2 providers sign for Mr. Wojtaszek- since he is confused today.  Left a message with [place], RN [facility] [phone]

## 2018-04-11 NOTE — Progress Notes (Signed)
Palliative Medicine RN Note: Rec'd call back from [facility]. He will come see patient ASAP. He reports that they do not have a GIP contract with [place], so their nurses cannot come in, but they do not discharge the patients either, despite inpatient admission to a non-contracted facility.  Waiting for [facility] to discharge or transfer the patient so we can discharge the patient to residential/inpatient hospice.  Marjie Skiff Callyn Severtson, RN, BSN, [facility] Palliative Medicine Team 04/11/2018 12:45 PM Office [phone]

## 2018-04-11 NOTE — Discharge Summary (Signed)
Discharge Summary  Michael Mathews VFI:[id number] DOB: [birth year]  PCP: [name], MD  Admit date: 04/10/2018 Discharge date: 04/11/2018  Time spent: 25 minutes  Recommendations for Outpatient Follow-up:  1. Medication change: Patient is now comfort care and actively dying.  All of his previous outpatient medications have been discontinued. 2. PRN medications for pain and agitation as per hospice house  Discharge Diagnoses:  Active Hospital Problems   Diagnosis Date Noted  . Acute respiratory failure with hypoxia ([place]) 04/10/2018  . Anemia 12/28/2017  . 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 OSA (obstructive sleep apnea) 09/01/2015  . Type 2 diabetes mellitus without complications ([place]) 41/66/0630  . Chronic obstructive pulmonary disease ([place]) 12/02/2008  . Essential (primary) hypertension 09/05/2007    Resolved Hospital Problems  No resolved problems to display.    Discharge Condition: Actively dying  Diet recommendation: Comfort feeds only  Vitals:   04/11/18 0546 04/11/18 0830  BP: (!) 101/91   Pulse: (!) 140   Resp:    Temp: (!) 101.4 F (38.6 C)   SpO2: (!) 84% (!) 88%    History of present illness:  72 year old male with past medical history of metastatic neuroendocrine cancer of the prostate along with history of CHF and COPD and atrial fibrillation brought in from skilled nursing facility after he was found to be acutely hypoxic and short of breath.  In the emergency room, patient was found to be in rapid A. Fib, with a hemoglobin of 4.2, creatinine of 1.68 and troponin of 0.35.  Lactic acid level at 4.12.  Oxygen saturation at 88% and temperature of 101.4.  After discussion with admitting physician, patient indicated that he wishes to be only made comfortable and no additional measures.  Hospital Course:  Principal Problem: Sepsis secondary to pneumonia causing acute respiratory failure with hypoxia ([place]) in patient with history of CHF and  COPD: Patient is indicated he wishes to be comfort care.  He has been receiving PRN medication for pain and agitation.  His wishes to not pass away in the hospital.  Pending approval, he is being transferred to hospice house Active Problems:   Chronic obstructive pulmonary disease ([place])   Essential (primary) hypertension   OSA (obstructive sleep apnea)   Type 2 diabetes mellitus without complications ([place])   Prostate cancer metastatic to bone ([facility])   Anemia   Procedures:  None  Consultations:  Hospice and palliative care  Discharge Exam: BP (!) 101/91 (BP Location: Right Arm)   Pulse (!) 140   Temp (!) 101.4 F (38.6 C) (Oral)   Resp 16   Ht 5\' 10"  (1.778 m)   Wt 105 kg   SpO2 (!) 88%   BMI 33.21 kg/m   General: Resting comfortably Cardiovascular: Irregular rhythm, tachycardic Respiratory: Coarse breath sounds  Discharge Instructions You were cared for by a hospitalist during your hospital stay. If you have any questions about your discharge medications or the care you received while you were in the hospital after you are discharged, you can call the unit and asked to speak with the hospitalist on call if the hospitalist that took care of you is not available. Once you are discharged, your primary care physician will handle any further medical issues. Please note that NO REFILLS for any discharge medications will be authorized once you are discharged, as it is imperative that you return to your primary care physician (or establish a relationship with a primary care physician if you do not  have one) for your aftercare needs so that they can reassess your need for medications and monitor your lab values.  Discharge Instructions    Bed rest   Complete by:  As directed      Allergies as of 04/11/2018   No Known Allergies     Medication List    STOP taking these medications   amiodarone 200 MG tablet Commonly known as:  PACERONE   amLODipine 10 MG tablet Commonly known  as:  NORVASC   DULoxetine 30 MG capsule Commonly known as:  CYMBALTA   finasteride 5 MG tablet Commonly known as:  PROSCAR   Fluticasone-Salmeterol 250-50 MCG/DOSE Aepb Commonly known as:  ADVAIR   ketoconazole 2 % cream Commonly known as:  NIZORAL   lidocaine-prilocaine cream Commonly known as:  EMLA   metoprolol tartrate 100 MG tablet Commonly known as:  LOPRESSOR   pantoprazole 40 MG tablet Commonly known as:  PROTONIX   predniSONE 10 MG (21) Tbpk tablet Commonly known as:  STERAPRED UNI-PAK 21 TAB   tamsulosin 0.4 MG Caps capsule Commonly known as:  FLOMAX   vitamin B-12 1000 MCG tablet Commonly known as:  CYANOCOBALAMIN       No Known Allergies    The results of significant diagnostics from this hospitalization (including imaging, microbiology, ancillary and laboratory) are listed below for reference.    Significant Diagnostic Studies: Ct Chest W Contrast  Result Date: 03/18/2018 CLINICAL DATA:  Prostate carcinoma with bone metastasis. Metastatic high-grade neuroendocrine tumor. EXAM: CT CHEST, ABDOMEN, AND PELVIS WITH CONTRAST TECHNIQUE: Multidetector CT imaging of the chest, abdomen and pelvis was performed following the standard protocol during bolus administration of intravenous contrast. CONTRAST:  167mL ISOVUE-300 IOPAMIDOL (ISOVUE-300) INJECTION 61% COMPARISON:  12/11/2017 FINDINGS: CT CHEST FINDINGS Cardiovascular: No significant vascular findings. Normal heart size. No pericardial effusion. Port in the anterior chest wall with tip in distal SVC. Mediastinum/Nodes: No axillary or supraclavicular adenopathy. Enlarged prevascular lymph nodes similar prior measuring 11 mm. No new mediastinal adenopathy. Lungs/Pleura: Bilateral pulmonary nodules are not increased in size and number. Example: Nodular consolidation in the RIGHT middle lobe measuring 17 mm (image 39/2) is new. Nodular lesion in the LEFT upper lobe measuring 12 mm (image 15/2) is new New lesion in the  LEFT lower lobe measuring 6 mm on image 33/2. There is increase in bilateral pleural effusions which are larger on the RIGHT and small on LEFT. Musculoskeletal: No aggressive osseous lesion. CT ABDOMEN AND PELVIS FINDINGS Hepatobiliary: Multiple round metastatic lesions within LEFT RIGHT hepatic lobe similar to comparison PET-CT scan. Example lesion in the lateral aspect of the LEFT hepatic lobe measures 7.2 cm increased from 5.3 cm. Lesion in the caudate measures 3.7 cm compared to 3.6 cm. Pancreas: Pancreas is normal. No ductal dilatation. No pancreatic inflammation. Spleen: Normal spleen Adrenals/urinary tract: Adrenal glands and kidneys are normal. The ureters and bladder normal. Stomach/Bowel: Stomach, small bowel, appendix, and cecum are normal. The colon and rectosigmoid colon are normal. Vascular/Lymphatic: Abdominal aorta is normal caliber with atherosclerotic calcification. There is no retroperitoneal or periportal lymphadenopathy. No pelvic lymphadenopathy. Reproductive: Post prostatectomy Other: No peritoneal metastasis. Musculoskeletal: Diffuse bone sclerosis unchanged from comparison exam. This sclerosis sclerotic metastasis or fluid throughout the skeleton IMPRESSION: Chest Impression: 1. Increase in bilateral pulmonary nodules most consistent with disease progression of prostate carcinoma. Pseudo progression of immunotherapy is less favored. 2. Stable mediastinal adenopathy. 3. Interval increase in bilateral pleural effusions. Abdomen / Pelvis Impression: 1. No significant change in multiple hepatic  metastasis. 2. No clear disease progression in the abdomen pelvis. 3. Confluent sclerotic metastasis within entire skeleton. Electronically Signed   By: Suzy Bouchard M.D.   On: 03/18/2018 15:05   Ct Abdomen Pelvis W Contrast  Result Date: 03/18/2018 CLINICAL DATA:  Prostate carcinoma with bone metastasis. Metastatic high-grade neuroendocrine tumor. EXAM: CT CHEST, ABDOMEN, AND PELVIS WITH CONTRAST  TECHNIQUE: Multidetector CT imaging of the chest, abdomen and pelvis was performed following the standard protocol during bolus administration of intravenous contrast. CONTRAST:  146mL ISOVUE-300 IOPAMIDOL (ISOVUE-300) INJECTION 61% COMPARISON:  12/11/2017 FINDINGS: CT CHEST FINDINGS Cardiovascular: No significant vascular findings. Normal heart size. No pericardial effusion. Port in the anterior chest wall with tip in distal SVC. Mediastinum/Nodes: No axillary or supraclavicular adenopathy. Enlarged prevascular lymph nodes similar prior measuring 11 mm. No new mediastinal adenopathy. Lungs/Pleura: Bilateral pulmonary nodules are not increased in size and number. Example: Nodular consolidation in the RIGHT middle lobe measuring 17 mm (image 39/2) is new. Nodular lesion in the LEFT upper lobe measuring 12 mm (image 15/2) is new New lesion in the LEFT lower lobe measuring 6 mm on image 33/2. There is increase in bilateral pleural effusions which are larger on the RIGHT and small on LEFT. Musculoskeletal: No aggressive osseous lesion. CT ABDOMEN AND PELVIS FINDINGS Hepatobiliary: Multiple round metastatic lesions within LEFT RIGHT hepatic lobe similar to comparison PET-CT scan. Example lesion in the lateral aspect of the LEFT hepatic lobe measures 7.2 cm increased from 5.3 cm. Lesion in the caudate measures 3.7 cm compared to 3.6 cm. Pancreas: Pancreas is normal. No ductal dilatation. No pancreatic inflammation. Spleen: Normal spleen Adrenals/urinary tract: Adrenal glands and kidneys are normal. The ureters and bladder normal. Stomach/Bowel: Stomach, small bowel, appendix, and cecum are normal. The colon and rectosigmoid colon are normal. Vascular/Lymphatic: Abdominal aorta is normal caliber with atherosclerotic calcification. There is no retroperitoneal or periportal lymphadenopathy. No pelvic lymphadenopathy. Reproductive: Post prostatectomy Other: No peritoneal metastasis. Musculoskeletal: Diffuse bone sclerosis  unchanged from comparison exam. This sclerosis sclerotic metastasis or fluid throughout the skeleton IMPRESSION: Chest Impression: 1. Increase in bilateral pulmonary nodules most consistent with disease progression of prostate carcinoma. Pseudo progression of immunotherapy is less favored. 2. Stable mediastinal adenopathy. 3. Interval increase in bilateral pleural effusions. Abdomen / Pelvis Impression: 1. No significant change in multiple hepatic metastasis. 2. No clear disease progression in the abdomen pelvis. 3. Confluent sclerotic metastasis within entire skeleton. Electronically Signed   By: Suzy Bouchard M.D.   On: 03/18/2018 15:05   Dg Chest Portable 1 View  Result Date: 04/10/2018 CLINICAL DATA:  Shortness of breath. EXAM: PORTABLE CHEST 1 VIEW COMPARISON:  02/13/2018 and chest CT 03/18/2018 FINDINGS: Right IJ Port-A-Cath unchanged. Lungs are adequately inflated demonstrate hazy prominence of the perihilar markings likely a degree of vascular congestion. There is hazy opacification over the right lung likely mostly due to layering moderate size right pleural effusion. Mild patchy density within the right lung which may be due to infection. Mild stable cardiomegaly. Diffuse patchy sclerosis throughout the bony structures compatible with known metastatic prostate cancer. Remainder of the exam is unchanged. IMPRESSION: Hazy opacification over the right lung likely due mostly to layering pleural effusion with basilar atelectasis. Patchy density over the right midlung which may be due to infection. Cardiomegaly with hazy prominence of the perihilar markings suggesting a degree of interstitial edema. Evidence of known bone metastases from prostate cancer. Electronically Signed   By: Marin Olp M.D.   On: 04/10/2018 22:03  Microbiology: No results found for this or any previous visit (from the past 240 hour(s)).   Labs: Basic Metabolic Panel: Recent Labs  Lab 04/10/18 2112  NA 139  K 4.7    CL 107  CO2 22  GLUCOSE 109*  BUN 31*  CREATININE 1.68*  CALCIUM 7.3*   Liver Function Tests: Recent Labs  Lab 04/10/18 2124  AST 637*  ALT 161*  ALKPHOS 569*  BILITOT 1.7*  PROT 5.6*  ALBUMIN 2.3*   No results for input(s): LIPASE, AMYLASE in the last 168 hours. No results for input(s): AMMONIA in the last 168 hours. CBC: Recent Labs  Lab 04/10/18 2112  WBC 9.6  HGB 4.2*  HCT 14.1*  MCV 97.2  PLT 73*   Cardiac Enzymes: No results for input(s): CKTOTAL, CKMB, CKMBINDEX, TROPONINI in the last 168 hours. BNP: BNP (last 3 results) Recent Labs    04/10/18 2124  BNP 962.3*    ProBNP (last 3 results) No results for input(s): PROBNP in the last 8760 hours.  CBG: No results for input(s): GLUCAP in the last 168 hours.     Signed:  Annita Brod, MD [facility] 04/11/2018, 12:29 PM

## 2018-04-11 NOTE — Plan of Care (Signed)

## 2018-05-12 DEATH — deceased

## 2021-03-29 ENCOUNTER — Encounter: Payer: Self-pay | Admitting: Oncology
